# Patient Record
Sex: Male | Born: 1945
Health system: Southern US, Community
[De-identification: ages and names within clinical notes are randomized; demographics above are authoritative.]

## PROBLEM LIST (undated history)

## (undated) DIAGNOSIS — I491 Atrial premature depolarization: Secondary | ICD-10-CM

## (undated) DIAGNOSIS — I2510 Atherosclerotic heart disease of native coronary artery without angina pectoris: Secondary | ICD-10-CM

## (undated) DIAGNOSIS — I10 Essential (primary) hypertension: Secondary | ICD-10-CM

## (undated) DIAGNOSIS — R001 Bradycardia, unspecified: Secondary | ICD-10-CM

## (undated) DIAGNOSIS — I4891 Unspecified atrial fibrillation: Secondary | ICD-10-CM

## (undated) DIAGNOSIS — K5730 Diverticulosis of large intestine without perforation or abscess without bleeding: Secondary | ICD-10-CM

## (undated) DIAGNOSIS — J939 Pneumothorax, unspecified: Secondary | ICD-10-CM

## (undated) DIAGNOSIS — I4729 Other ventricular tachycardia: Secondary | ICD-10-CM

## (undated) DIAGNOSIS — I471 Supraventricular tachycardia: Secondary | ICD-10-CM

## (undated) DIAGNOSIS — IMO0002 Reserved for concepts with insufficient information to code with codable children: Secondary | ICD-10-CM

## (undated) DIAGNOSIS — T7840XA Allergy, unspecified, initial encounter: Secondary | ICD-10-CM

## (undated) DIAGNOSIS — M25519 Pain in unspecified shoulder: Secondary | ICD-10-CM

## (undated) DIAGNOSIS — I9789 Other postprocedural complications and disorders of the circulatory system, not elsewhere classified: Secondary | ICD-10-CM

## (undated) DIAGNOSIS — E7800 Pure hypercholesterolemia, unspecified: Secondary | ICD-10-CM

## (undated) DIAGNOSIS — N200 Calculus of kidney: Secondary | ICD-10-CM

## (undated) DIAGNOSIS — I472 Ventricular tachycardia: Secondary | ICD-10-CM

## (undated) DIAGNOSIS — N182 Chronic kidney disease, stage 2 (mild): Secondary | ICD-10-CM

## (undated) DIAGNOSIS — Z87442 Personal history of urinary calculi: Secondary | ICD-10-CM

## (undated) HISTORY — DX: Pain in unspecified shoulder: M25.519

## (undated) HISTORY — DX: Bradycardia, unspecified: R00.1

## (undated) HISTORY — DX: Other ventricular tachycardia: I47.29

## (undated) HISTORY — DX: Unspecified atrial fibrillation: I48.91

## (undated) HISTORY — DX: Reserved for concepts with insufficient information to code with codable children: IMO0002

## (undated) HISTORY — DX: Unspecified atrial fibrillation: I97.89

## (undated) HISTORY — PX: OTHER SURGICAL HISTORY: SHX169

## (undated) HISTORY — DX: Chronic kidney disease, stage 2 (mild): N18.2

## (undated) HISTORY — DX: Atrial premature depolarization: I49.1

## (undated) HISTORY — DX: Supraventricular tachycardia: I47.1

## (undated) HISTORY — DX: Calculus of kidney: N20.0

## (undated) HISTORY — DX: Allergy, unspecified, initial encounter: T78.40XA

## (undated) HISTORY — DX: Pneumothorax, unspecified: J93.9

## (undated) HISTORY — DX: Diverticulosis of large intestine without perforation or abscess without bleeding: K57.30

## (undated) HISTORY — DX: Atherosclerotic heart disease of native coronary artery without angina pectoris: I25.10

## (undated) HISTORY — PX: COLONOSCOPY: SHX174

## (undated) HISTORY — DX: Essential (primary) hypertension: I10

## (undated) HISTORY — DX: Pure hypercholesterolemia, unspecified: E78.00

## (undated) HISTORY — DX: Ventricular tachycardia: I47.2

---

## 2003-12-28 ENCOUNTER — Ambulatory Visit: Payer: Self-pay | Admitting: Pulmonary Disease

## 2004-02-15 ENCOUNTER — Ambulatory Visit: Payer: Self-pay | Admitting: Pulmonary Disease

## 2004-02-16 ENCOUNTER — Ambulatory Visit (HOSPITAL_COMMUNITY): Admission: RE | Admit: 2004-02-16 | Discharge: 2004-02-16 | Payer: Self-pay | Admitting: Pulmonary Disease

## 2004-04-12 ENCOUNTER — Ambulatory Visit: Payer: Self-pay | Admitting: Pulmonary Disease

## 2004-12-28 ENCOUNTER — Ambulatory Visit: Payer: Self-pay | Admitting: Pulmonary Disease

## 2005-12-31 ENCOUNTER — Ambulatory Visit: Payer: Self-pay | Admitting: Pulmonary Disease

## 2006-01-30 ENCOUNTER — Ambulatory Visit: Payer: Self-pay | Admitting: Pulmonary Disease

## 2006-01-30 LAB — CONVERTED CEMR LAB
OCCULT 1: NEGATIVE
OCCULT 3: NEGATIVE
OCCULT 4: NEGATIVE
OCCULT 5: NEGATIVE

## 2006-12-26 DIAGNOSIS — I10 Essential (primary) hypertension: Secondary | ICD-10-CM

## 2006-12-26 DIAGNOSIS — Z87442 Personal history of urinary calculi: Secondary | ICD-10-CM

## 2006-12-26 DIAGNOSIS — M5030 Other cervical disc degeneration, unspecified cervical region: Secondary | ICD-10-CM | POA: Insufficient documentation

## 2006-12-26 DIAGNOSIS — E7800 Pure hypercholesterolemia, unspecified: Secondary | ICD-10-CM

## 2006-12-26 DIAGNOSIS — K649 Unspecified hemorrhoids: Secondary | ICD-10-CM | POA: Insufficient documentation

## 2006-12-26 HISTORY — DX: Personal history of urinary calculi: Z87.442

## 2007-01-21 ENCOUNTER — Ambulatory Visit: Payer: Self-pay | Admitting: Pulmonary Disease

## 2007-01-21 DIAGNOSIS — M25519 Pain in unspecified shoulder: Secondary | ICD-10-CM | POA: Insufficient documentation

## 2007-01-21 LAB — CONVERTED CEMR LAB
AST: 22 units/L (ref 0–37)
Albumin: 3.8 g/dL (ref 3.5–5.2)
Alkaline Phosphatase: 54 units/L (ref 39–117)
BUN: 16 mg/dL (ref 6–23)
Basophils Relative: 0.9 % (ref 0.0–1.0)
Bilirubin Urine: NEGATIVE
CO2: 31 meq/L (ref 19–32)
Chloride: 104 meq/L (ref 96–112)
Creatinine, Ser: 1.3 mg/dL (ref 0.4–1.5)
Crystals: NEGATIVE
Eosinophils Relative: 4.8 % (ref 0.0–5.0)
HCT: 39.9 % (ref 39.0–52.0)
HDL: 69.4 mg/dL (ref >39.0)
Hemoglobin: 13.7 g/dL (ref 13.0–17.0)
LDL Cholesterol: 93 mg/dL (ref 0–99)
Lymphocytes Relative: 30.1 % (ref 12.0–46.0)
Monocytes Absolute: 0.7 10*3/uL (ref 0.2–0.7)
Mucus, UA: NEGATIVE
Neutro Abs: 3 10*3/uL (ref 1.4–7.7)
Neutrophils Relative %: 51.9 % (ref 43.0–77.0)
Nitrite: NEGATIVE
Potassium: 4.6 meq/L (ref 3.5–5.1)
RDW: 12.3 % (ref 11.5–14.6)
Sodium: 140 meq/L (ref 135–145)
Specific Gravity, Urine: 1.01 (ref 1.000–1.03)
TSH: 1.61 microintl units/mL (ref 0.35–5.50)
Total Bilirubin: 0.9 mg/dL (ref 0.3–1.2)
Total Protein: 6.7 g/dL (ref 6.0–8.3)
Urine Glucose: NEGATIVE mg/dL
Urobilinogen, UA: 0.2 (ref 0.0–1.0)
VLDL: 5 mg/dL (ref 0–40)
WBC: 5.9 10*3/uL (ref 4.5–10.5)

## 2008-01-21 ENCOUNTER — Ambulatory Visit: Payer: Self-pay | Admitting: Pulmonary Disease

## 2008-01-31 LAB — CONVERTED CEMR LAB
AST: 19 units/L (ref 0–37)
Alkaline Phosphatase: 53 units/L (ref 39–117)
Basophils Relative: 1 % (ref 0.0–3.0)
Bilirubin, Direct: 0.2 mg/dL (ref 0.0–0.3)
Chloride: 102 meq/L (ref 96–112)
Crystals: NEGATIVE
Eosinophils Relative: 6.4 % — ABNORMAL HIGH (ref 0.0–5.0)
GFR calc Af Amer: 66 mL/min
GFR calc non Af Amer: 55 mL/min
Glucose, Bld: 97 mg/dL (ref 70–99)
HDL: 66.3 mg/dL (ref >39.0)
Ketones, ur: NEGATIVE mg/dL
Lymphocytes Relative: 31.8 % (ref 12.0–46.0)
Monocytes Relative: 13.9 % — ABNORMAL HIGH (ref 3.0–12.0)
Neutrophils Relative %: 46.9 % (ref 43.0–77.0)
Platelets: 266 10*3/uL (ref 150–400)
Potassium: 4.4 meq/L (ref 3.5–5.1)
RBC: 4.24 M/uL (ref 4.22–5.81)
Sodium: 138 meq/L (ref 135–145)
Specific Gravity, Urine: 1.01 (ref 1.000–1.03)
Total CHOL/HDL Ratio: 2.5
Total Protein, Urine: 30 mg/dL — AB
Total Protein: 6.5 g/dL (ref 6.0–8.3)
Urine Glucose: NEGATIVE mg/dL
Urobilinogen, UA: 0.2 (ref 0.0–1.0)
VLDL: 4 mg/dL (ref 0–40)
WBC: 5.3 10*3/uL (ref 4.5–10.5)
pH: 7 (ref 5.0–8.0)

## 2008-02-02 ENCOUNTER — Telehealth (INDEPENDENT_AMBULATORY_CARE_PROVIDER_SITE_OTHER): Payer: Self-pay | Admitting: *Deleted

## 2008-02-10 ENCOUNTER — Ambulatory Visit: Payer: Self-pay | Admitting: Pulmonary Disease

## 2008-02-10 LAB — CONVERTED CEMR LAB
OCCULT 1: NEGATIVE
OCCULT 2: NEGATIVE
OCCULT 4: NEGATIVE
OCCULT 5: NEGATIVE

## 2009-01-20 ENCOUNTER — Ambulatory Visit: Payer: Self-pay | Admitting: Pulmonary Disease

## 2009-01-21 DIAGNOSIS — K5730 Diverticulosis of large intestine without perforation or abscess without bleeding: Secondary | ICD-10-CM | POA: Insufficient documentation

## 2009-01-21 LAB — CONVERTED CEMR LAB
Albumin: 4 g/dL (ref 3.5–5.2)
Alkaline Phosphatase: 57 units/L (ref 39–117)
Basophils Relative: 0.4 % (ref 0.0–3.0)
Bilirubin Urine: NEGATIVE
CO2: 30 meq/L (ref 19–32)
Chloride: 102 meq/L (ref 96–112)
Eosinophils Absolute: 0.3 10*3/uL (ref 0.0–0.7)
HCT: 38.6 % — ABNORMAL LOW (ref 39.0–52.0)
Hemoglobin, Urine: NEGATIVE
Hemoglobin: 13.4 g/dL (ref 13.0–17.0)
MCHC: 34.6 g/dL (ref 30.0–36.0)
MCV: 96 fL (ref 78.0–100.0)
Monocytes Absolute: 0.8 10*3/uL (ref 0.1–1.0)
Neutro Abs: 3 10*3/uL (ref 1.4–7.7)
Nitrite: NEGATIVE
RBC: 4.02 M/uL — ABNORMAL LOW (ref 4.22–5.81)
Sodium: 136 meq/L (ref 135–145)
Total CHOL/HDL Ratio: 2
Total Protein, Urine: NEGATIVE mg/dL
Total Protein: 6.7 g/dL (ref 6.0–8.3)

## 2009-03-06 ENCOUNTER — Ambulatory Visit: Payer: Self-pay | Admitting: Pulmonary Disease

## 2009-03-06 LAB — CONVERTED CEMR LAB
OCCULT 1: NEGATIVE
OCCULT 2: NEGATIVE
OCCULT 3: NEGATIVE
OCCULT 4: NEGATIVE

## 2009-07-24 ENCOUNTER — Telehealth: Payer: Self-pay | Admitting: Pulmonary Disease

## 2009-10-24 ENCOUNTER — Encounter (INDEPENDENT_AMBULATORY_CARE_PROVIDER_SITE_OTHER): Payer: Self-pay | Admitting: *Deleted

## 2009-11-28 ENCOUNTER — Telehealth: Payer: Self-pay | Admitting: Emergency Medicine

## 2009-12-05 ENCOUNTER — Ambulatory Visit: Payer: Self-pay | Admitting: Pulmonary Disease

## 2009-12-05 DIAGNOSIS — L219 Seborrheic dermatitis, unspecified: Secondary | ICD-10-CM | POA: Insufficient documentation

## 2010-01-19 ENCOUNTER — Ambulatory Visit: Payer: Self-pay | Admitting: Pulmonary Disease

## 2010-01-19 ENCOUNTER — Encounter: Payer: Self-pay | Admitting: Pulmonary Disease

## 2010-01-21 LAB — CONVERTED CEMR LAB
Albumin: 3.8 g/dL (ref 3.5–5.2)
BUN: 15 mg/dL (ref 6–23)
Bilirubin Urine: NEGATIVE
Calcium: 9.1 mg/dL (ref 8.4–10.5)
Creatinine, Ser: 1.3 mg/dL (ref 0.4–1.5)
Eosinophils Relative: 4.9 % (ref 0.0–5.0)
GFR calc non Af Amer: 60.35 mL/min (ref >60.00)
Glucose, Bld: 80 mg/dL (ref 70–99)
HCT: 39.9 % (ref 39.0–52.0)
HDL: 66.3 mg/dL (ref >39.00)
Hemoglobin: 13.4 g/dL (ref 13.0–17.0)
Ketones, ur: NEGATIVE mg/dL
LDL Cholesterol: 84 mg/dL (ref 0–99)
Leukocytes, UA: NEGATIVE
Lymphs Abs: 1.5 10*3/uL (ref 0.7–4.0)
Monocytes Relative: 11.4 % (ref 3.0–12.0)
Neutro Abs: 4.3 10*3/uL (ref 1.4–7.7)
PSA: 1.06 ng/mL (ref 0.10–4.00)
RBC: 4.16 M/uL — ABNORMAL LOW (ref 4.22–5.81)
Sodium: 139 meq/L (ref 135–145)
TSH: 1.64 microintl units/mL (ref 0.35–5.50)
Urine Glucose: NEGATIVE mg/dL
Urobilinogen, UA: 0.2 (ref 0.0–1.0)
VLDL: 4.8 mg/dL (ref 0.0–40.0)
WBC: 7 10*3/uL (ref 4.5–10.5)
pH: 7 (ref 5.0–8.0)

## 2010-03-15 NOTE — Assessment & Plan Note (Signed)
Summary: rov 1 yr ///kp   CC:  Yearly ROV & CPX....  History of Present Illness: 65 y/o WM here for an add-on visit... he is followed for HBP & Chol, w/ a past hx of kidney stones & cspine dz...    ~  Dec10:  he notes he's been feeling quite well and only mentions some mild postural dizziness... I note that he is on Hyzaar 100-25 & BP= 118/68 so we decided to change to Losartan 100mg /d...    ~  December 05, 2009:  Add-on for rash about the hairline- exam shows mod severe seborrheic dermatitis in scalp, some on face, and arms... we discussed Rx w/ Anti-dandruff shampoos & Lortisone cream... we will set up appt w/ Derm- DrJordan... otherw feeling well- BP controlled on meds, stable on Lip10, etc.   ~  January 19, 2010:  he states that Derm told him he had a form of Psoriasis & Rx w/ topical cream & improved... he's had a good year- no new complaints or concerns... BP controlled ;  FLP looks good;  GI stable & colon due in 2014;  he had shoulder injection from Ortho ?rotator cuff prob?[name]..     Problem List:    HYPERTENSION (ICD-401.9) - controlled on LOSARTAN 100mg /d w/ BP 110/70 today & similar at home, he says...  denies HA, fatigue, visual changes, CP, palipit, dizziness, syncope, dyspnea, edema,  etc...   ~  12/10: notes sl postural dizziness on & off so we decided to change Hyzaar to LOSARTAN 100mg /d.  ~  12/11:  doing very well on the Losartan 100mg /d- BP 110/70 & similar at home;  CXR is clear, WNL;  EKG shows sinus brady, NAD...  HYPERCHOLESTEROLEMIA (ICD-272.0) - controlled on diet and LIPITOR 10mg /d...  ~  FLP 11/07 showed TChol 147, TG 24, HDL 61, LDL 81...  ~  FLP 12/08 showed TChol 167, TG 25, HDL 69, LDL 93... continue same Rx.  ~  FLP 12.09 showed TChol 163, TG 20, HDL 66. LDL 93... rec- same.  ~  FLP 12/10 showed TChol 161, TG 20, HDL 83, LDL 74  ~  FLP 12/11 on Lip10 showed TChol 155, TG 24, HDL 66, LDL 84  DIVERTICULITIS, COLON (ICD-562.11) & HEMORRHOIDS (ICD-455.6) - last  colonoscopy 11/04 by [name] w/ divertics & int hems, f/u planned 63yrs per GI protocol...  DEGENERATIVE DISC DISEASE, CERVICAL SPINE (ICD-722.4) & SHOULDER PAIN (ICD-719.41) - eval 6/97 by Dagoberto Reef w/ MRI showing DDD & mild sp stenosis w/o nerve root impingement - Rx conservatively;  increased pain in 2/06 w/ eval DrElsner and MRI showing spondylosis at 3 levels, again Rx conserv...  he uses CELEBREX 200mg  Prn for pain...  RENAL CALCULUS, HX OF (ICD-V13.01) - he's had 3 stones in the past and managed to pass all of them... last about 6 years ago... no prob currently.  Health Maint - on ASA 81mg /d... he is up-to-date on his Colonoscopy (last 2004 w/ f/u in 18yrs), PSA (12/11= 1.06), and vaccinations (flu shot today) & had pneumovax 8/03 & tetanus 8/03...   Preventive Screening-Counseling & Management  Alcohol-Tobacco     Smoking Status: quit     Year Quit: 1981  Allergies (verified): No Known Drug Allergies  Comments:  Nurse/Medical Assistant: The patient's medications and allergies were reviewed with the patient and were updated in the Medication and Allergy Lists.  Past History:  Past Medical History: 1. HYPERTENSION (ICD-401.9) 2. HYPERCHOLESTEROLEMIA (ICD-272.0) 3. DIVERTICULOSIS, COLON (ICD-562.10) - last colonoscopy 11/04 by [name]  w/ divertics & int hems; f/u 38yrs. 4. HEMORRHOIDS (ICD-455.6) 5. RENAL CALCULUS, HX OF (ICD-V13.01) 6. DEGENERATIVE DISC DISEASE, CERVICAL SPINE (ICD-722.4) - eval 6/97 by Dagoberto Reef w/ MRI showing DDD & mild sp stenosis w/o nerve root impingement - Rx conservatively;  increased pain in 2/06 w/ eval DrElsner and MRI showing spondylosis at 3 levels, again Rx conserv. 7. SHOULDER PAIN (ICD-719.41)  Family History: Reviewed history from 12/05/2009 and no changes required. Father died age 48 from ?cause, hx CABG... Mother died age 47 w/ MI... 3 Siblings- all Sisters 1 Sister died age 60 w/ lymphoma 2 Sisters alive & well  Social  History: Reviewed history from 12/05/2009 and no changes required. 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 Ex-smoker - w/ 10 packyr  hx, quit >30 yrs ago Social Loss adjuster, chartered  Review of Systems  The patient denies fever, chills, sweats, anorexia, fatigue, weakness, malaise, weight loss, sleep disorder, blurring, diplopia, eye irritation, eye discharge, vision loss, eye pain, photophobia, earache, ear discharge, tinnitus, decreased hearing, nasal congestion, nosebleeds, sore throat, hoarseness, chest pain, palpitations, syncope, dyspnea on exertion, orthopnea, PND, peripheral edema, cough, dyspnea at rest, excessive sputum, hemoptysis, wheezing, pleurisy, nausea, vomiting, diarrhea, constipation, change in bowel habits, abdominal pain, melena, hematochezia, jaundice, gas/bloating, indigestion/heartburn, dysphagia, odynophagia, dysuria, hematuria, urinary frequency, urinary hesitancy, nocturia, incontinence, back pain, joint pain, joint swelling, muscle cramps, muscle weakness, stiffness, arthritis, sciatica, restless legs, leg pain at night, leg pain with exertion, rash, itching, dryness, suspicious lesions, paralysis, paresthesias, seizures, tremors, vertigo, transient blindness, frequent falls, frequent headaches, difficulty walking, depression, anxiety, memory loss, confusion, cold intolerance, heat intolerance, polydipsia, polyphagia, polyuria, unusual weight change, abnormal bruising, bleeding, enlarged lymph nodes, urticaria, allergic rash, hay fever, and recurrent infections.    Vital Signs:  Patient profile:   65 year old male Height:      69 inches Weight:      161.38 pounds BMI:     23.92 O2 Sat:      98 % on Room air Temp:     97.0 degrees F oral Pulse rate:   55 / minute BP sitting:   110 / 70  (left arm) Cuff size:   large  Vitals Entered By: Randell Loop CMA (January 19, 2010 8:51 AM)  O2 Sat at Rest %:  98 O2 Flow:  Room air CC: Yearly ROV &  CPX... Is Patient Diabetic? No Pain Assessment Patient in pain? no      Comments meds updated today with pt   Physical Exam  Additional Exam:  WD, WN, 65 y/o WM in NAD... GENERAL:  Alert & oriented; pleasant & cooperative... HEENT:  [place]/AT, EOM-wnl, PERRLA, EACs-clear, TMs-wnl, NOSE-clear, THROAT-clear & wnl. NECK:  Supple w/ fairROM; no JVD; normal carotid impulses w/o bruits; no thyromegaly or nodules palpated; no lymphadenopathy. CHEST:  Clear to P & A; without wheezes/ rales/ or rhonchi. HEART:  Regular Rhythm; without murmurs/ rubs/ or gallops. ABDOMEN:  Soft & nontender; normal bowel sounds; no organomegaly or masses detected. RECTAL:  Neg - prostate 2+ & nontender w/o nodules; stool hematest neg. EXT: without deformities or arthritic changes; no varicose veins/ venous insuffic/ or edema. NEURO:  CN's intact; motor testing normal; sensory testing normal; gait normal & balance OK. DERM:  Improved seborrheic dermatitis in scalp (dandruff), some on face, and arms...    Impression & Recommendations:  Problem # 1:  PHYSICAL EXAMINATION (ICD-V70.0)  Orders: EKG w/ Interpretation (93000) T-2 View CXR (71020TC)  TLB-BMP (Basic Metabolic Panel-BMET) (80048-METABOL) TLB-Hepatic/Liver Function Pnl (80076-HEPATIC) TLB-CBC Platelet - w/Differential (85025-CBCD) TLB-Lipid Panel (80061-LIPID) TLB-TSH (Thyroid Stimulating Hormone) (84443-TSH) TLB-PSA (Prostate Specific Antigen) (84153-PSA) TLB-Udip w/ Micro (81001-URINE)  Problem # 2:  HYPERTENSION (ICD-401.9) Controlled>  same med. His updated medication list for this problem includes:    Losartan Potassium 100 Mg Tabs (Losartan potassium) .[name]... Take 1 tab by mouth once daily...  Problem # 3:  HYPERCHOLESTEROLEMIA (ICD-272.0) Doing very well on Lip10>  continue same. His updated medication list for this problem includes:    Lipitor 20 Mg Tabs (Atorvastatin calcium) .[name]... Take as directed...  Problem # 4:  DIVERTICULOSIS OF  COLON (ICD-562.10) He is up to date w/ f/u colon due 11/14...  Problem # 5:  DEGENERATIVE DISC DISEASE, CERVICAL SPINE (ICD-722.4) Ortho stable & we will refill Celebrex per his request...  Problem # 6:  OTHER MEDICAL PROBLEMS AS NOTED>>>  Complete Medication List: 1)  Aspirin 325 Mg Tabs (Aspirin) .... Take 1 tablet by mouth once a day 2)  Losartan Potassium 100 Mg Tabs (Losartan potassium) .... Take 1 tab by mouth once daily.Marland KitchenMarland Kitchen 3)  Lipitor 20 Mg Tabs (Atorvastatin calcium) .... Take as directed... 4)  Celebrex 200 Mg Caps (Celecoxib) .... Take 1 cap by mouth once daily as needed for arthritis pain...  Patient Instructions: 1)  Today we updated your med list- see below.... 2)  We refilled your meds as discussed, including the Lipitor 20mg  tabs. 3)  Today we did your follow up CXR, EKG, & FASTING blood work... please call the "phone tree" in a few days for your lab results.Marland KitchenMarland Kitchen 4)  Keep up the good job w/ diet & exercise... 5)  Call for any problems.Marland KitchenMarland Kitchen 6)  Please schedule a follow-up appointment in 1 year. Prescriptions: CELEBREX 200 MG CAPS (CELECOXIB) take 1 cap by mouth once daily as needed for arthritis pain...  #30 x 12   Entered and Authorized by:   Michele Mcalpine MD   Signed by:   Michele Mcalpine MD on 01/19/2010   Method used:   Print then Give to Patient   RxID:   [id number] LIPITOR 20 MG TABS (ATORVASTATIN CALCIUM) take as directed...  #30 x 12   Entered and Authorized by:   Michele Mcalpine MD   Signed by:   Michele Mcalpine MD on 01/19/2010   Method used:   Print then Give to Patient   RxID:   [id number] LOSARTAN POTASSIUM 100 MG TABS (LOSARTAN POTASSIUM) take 1 tab by mouth once daily...  #30 x 12   Entered and Authorized by:   Michele Mcalpine MD   Signed by:   Michele Mcalpine MD on 01/19/2010   Method used:   Print then Give to Patient   RxID:   [id number]

## 2010-03-15 NOTE — Progress Notes (Signed)
Summary: shoulder pain  Phone Note Call from Patient Call back at Work Phone [phone]   Caller: Patient Call For: nadel Summary of Call: pt c/o R shoulder pain x 2 wks. says it's "puffy". should he see sn or be referred to ortho? says it feels like it's "out of joint". pt work in [employer]. pt # [id number] Initial call taken by: Tivis Ringer, CNA,  July 24, 2009 10:09 AM  Follow-up for Phone Call        Pt c/o having pain in right shoulder x 2 weeks. he staets he can't raise it up very high without it hurting, "feels like it is out of joint" although he knows it is not. He also states it is swollen as well. Pt states he has never seen an ortho doc and if this is what is rec. then he will need a referral. Please advise. Thanks. Carron Curie CMA  July 24, 2009 10:46 AM   Additional Follow-up for Phone Call Additional follow up Details #1::        order has been placed for pt to have appt with ortho Additional Follow-up by: Randell Loop CMA,  July 24, 2009 11:27 AM

## 2010-03-15 NOTE — Progress Notes (Signed)
Summary: referral  Phone Note Call from Patient Call back at [phone]   Caller: Patient Call For: nadel Summary of Call: need referral to see dermatologist at  [facility] dermatology perfer  dr [place] or whoever dr Kriste Basque reccommend. Initial call taken by: Rickard Patience,  November 28, 2009 1:51 PM  Follow-up for Phone Call        explained to pt unable to get derm appt for next week unable to get him in that soon, gave pt appt with sn for 10/25 at 12:00--told hime we could refer at [facility] but also maybe sn could give him something to help in the meantime  Follow-up by: Philipp Deputy CMA,  December 01, 2009 11:24 AM

## 2010-03-15 NOTE — Letter (Signed)
Summary: Colonoscopy Date Change Letter  [facility]  [address] [place], [place] [address]   Phone: [phone]  Fax: [phone]      October 24, 2009 MRN: [id number]   Jeff Graves [address] [place], [place]  [address]   Dear [name],   Previously you were recommended to have a repeat colonoscopy around this time. Your chart was recently reviewed by Dr. Russella Dar of [facility]. Follow up colonoscopy is now recommended in November 2014. This revised recommendation is based on current, nationally recognized guidelines for colorectal cancer screening and polyp surveillance. These guidelines are endorsed by the American Cancer Society, [employer] on Colorectal Cancer as well as numerous other major medical organizations.  Please understand that our recommendation assumes that you do not have any new symptoms such as bleeding, a change in bowel habits, anemia, or significant abdominal discomfort. If you do have any concerning GI symptoms or want to discuss the guideline recommendations, please call to arrange an office visit at your earliest convenience. Otherwise we will keep you in our reminder system and contact you 1-2 months prior to the date listed above to schedule your next colonoscopy.  Thank you,   Judie Petit T. Russella Dar, M.D.  [facility] [phone]

## 2010-03-15 NOTE — Assessment & Plan Note (Signed)
Summary: rash around hairline and ears//td   CC:  10 month ROV & add-on for rash....  History of Present Illness: 65 y/o WM here for an add-on visit... he is followed for HBP & Chol, w/ a past hx of kidney stones & cspine dz...    ~  Dec10:  he notes he's been feeling quite well and only mentions some mild postural dizziness... I note that he is on Hyzaar 100-25 & BP= 118/68 so we decided to change to Losartan 100mg /d...    ~  December 05, 2009:  Add-on for rash about the hairline- exam shows mod severe seborrheic dermatitis in scalp, some on face, and arms... we discussed Rx w/ Anti-dandruff shampoos & Lortisone cream... we will set up appt w/ Derm- DrJordan... otherw feeling well- BP controlled on meds, stable on Lip10, etc.    Problem List:    HYPERTENSION (ICD-401.9) - controlled on HYZAAR 100-25 daily w/ BP 138/82 today & similar at home, he says...  denies HA, fatigue, visual changes, CP, palipit, dizziness, syncope, dyspnea, edema,  etc...   ~  12/10: notes sl postural dizziness on & off so we decided to change Hyzaar to LOSARTAN 100mg /d.  HYPERCHOLESTEROLEMIA (ICD-272.0) - controlled on diet and LIPITOR 10mg /d... his wt is 164# which is down 1# from last yr...  ~  FLP 11/07 showed TChol 147, TG 24, HDL 61, LDL 81...  ~  FLP 12/08 showed TChol 167, TG 25, HDL 69, LDL 93... continue same Rx.  ~  FLP 12.09 showed TChol 163, TG 20, HDL 66. LDL 93... rec- same.  ~  FLP 12/10 showed TChol 161, TG 20, HDL 83, LDL 74  DIVERTICULITIS, COLON (ICD-562.11) & HEMORRHOIDS (ICD-455.6) - last colonoscopy 11/04 by [name] w/ divertics & int hems; f/u 25yrs & due 11/11...  DEGENERATIVE DISC DISEASE, CERVICAL SPINE (ICD-722.4) & SHOULDER PAIN (ICD-719.41) - eval 6/97 by Dagoberto Reef w/ MRI showing DDD & mild sp stenosis w/o nerve root impingement - Rx conservatively;  increased pain in 2/06 w/ eval DrElsner and MRI showing spondylosis at 3 levels, again Rx conserv.  ~  12/10: he requests Celebrex  perscription since it helped him in the past...  RENAL CALCULUS, HX OF (ICD-V13.01) - he's had 3 stones in the past and managed to pass all of them... last about 6 years ago... no prob currently.  Health Maint - on ASA 81mg /d... he is up-to-date on his Colonoscopy (last 2004 w/ f/u in 63yrs), PSA (repeat today), and vaccinations (flu shot today) & had pneumovax 8/03 & tetanus 8/03...   Preventive Screening-Counseling & Management  Alcohol-Tobacco     Smoking Status: quit     Year Quit: 1981  Allergies (verified): No Known Drug Allergies  Comments:  Nurse/Medical Assistant: The patient's medications and allergies were reviewed with the patient and were updated in the Medication and Allergy Lists.  Past History:  Past Medical History: 1. HYPERTENSION (ICD-401.9) 2. HYPERCHOLESTEROLEMIA (ICD-272.0) 3. DIVERTICULOSIS, COLON (ICD-562.10) - last colonoscopy 11/04 by [name] w/ divertics & int hems; f/u 26yrs. 4. HEMORRHOIDS (ICD-455.6) 5. RENAL CALCULUS, HX OF (ICD-V13.01) 6. DEGENERATIVE DISC DISEASE, CERVICAL SPINE (ICD-722.4) - eval 6/97 by Dagoberto Reef w/ MRI showing DDD & mild sp stenosis w/o nerve root impingement - Rx conservatively;  increased pain in 2/06 w/ eval DrElsner and MRI showing spondylosis at 3 levels, again Rx conserv. 7. SHOULDER PAIN (ICD-719.41)  Family History: Reviewed history from 01/20/2009 and no changes required. Father died age 71 from ?cause, hx CABG... Mother died age  62 w/ MI... 3 Siblings- all Sisters 1 Sister died age 63 w/ lymphoma 2 Sisters alive & well  Social History: Reviewed history from 01/20/2009 and no changes required. 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         CC today is the rash...  Vital Signs:  Patient profile:   65 year old male Height:      69 inches Weight:      163 pounds BMI:     24.16 O2 Sat:      100 % on Room air Temp:     96.8 degrees F oral Pulse rate:   56 / minute BP sitting:   138 / 82  (left arm) Cuff size:   regular  Vitals Entered By: Randell Loop CMA (December 05, 2009 11:58 AM)  O2 Sat at Rest %:  100 O2 Flow:  Room air CC: 10 month ROV & add-on for rash... Is Patient Diabetic? No Pain Assessment Patient in pain? no      Comments no changes in meds today   Physical Exam  Additional Exam:  WD, WN, 65 y/o WM in NAD... GENERAL:  Alert & oriented; pleasant & cooperative... HEENT:  [place]/AT, EOM-wnl, PERRLA, EACs-clear, TMs-wnl, NOSE-clear, THROAT-clear & wnl. NECK:  Supple w/ fairROM; no JVD; normal carotid impulses w/o bruits; no thyromegaly or nodules palpated; no lymphadenopathy. CHEST:  Clear to P & A; without wheezes/ rales/ or rhonchi. HEART:  Regular Rhythm; without murmurs/ rubs/ or gallops. ABDOMEN:  Soft & nontender; normal bowel sounds; no organomegaly or masses detected. RECTAL:  Neg - prostate 2+ & nontender w/o nodules; stool hematest neg. EXT: without deformities or arthritic changes; no varicose veins/ venous insuffic/ or edema. NEURO:  CN's intact; motor testing normal; sensory testing normal; gait normal & balance OK. DERM:  Mod severe seborrheic dermatitis in scalp (dandruff), some on face, and arms...    Impression & Recommendations:  Problem # 1:  SEBORRHEIC DERMATITIS (ICD-690.10) We discussed Rx w/ Shampoos> Head&Shoulders, SelsunBlue, T-Gel & alternate betw  them... Rx w/ gen Lotrisone cream Bid & ask derm to check pt as well... Orders: Dermatology Referral (Derma)  Problem # 2:  HYPERTENSION (ICD-401.9) Controlled>  same Rx. His updated medication list for this problem includes:    Losartan Potassium 100 Mg Tabs (Losartan potassium) .[name]... Take 1 tab by mouth once daily...  Problem # 3:  HYPERCHOLESTEROLEMIA (ICD-272.0) Continue Lip10... His updated medication list for this problem includes:    Lipitor 10 Mg Tabs (Atorvastatin calcium) .[name]... Take 1 tablet by mouth once a day  Problem # 4:  OTHER MEDICAL PROBLEMS AS NOTED>>> OK Flu shot today...  Complete Medication List: 1)  Aspirin 325 Mg Tabs (Aspirin) .... Take 1 tablet by mouth once a day 2)  Losartan Potassium 100 Mg Tabs (Losartan potassium) .... Take 1 tab by mouth once daily.Marland KitchenMarland Kitchen 3)  Lipitor 10 Mg Tabs (Atorvastatin calcium) .... Take 1 tablet by mouth once a day 4)  Celebrex 200  Mg Caps (Celecoxib) .... Take 1 cap by mouth once daily as needed for arthritis pain.Marland KitchenMarland Kitchen 5)  Lotrisone 1-0.05 % Crea (Clotrimazole-betamethasone) .... Apply to rash two times a day as directed...  Other Orders: Admin 1st Vaccine ([address]) Flu Vaccine 72yrs + [phone])  Patient Instructions: 1)  Today we updated your med list- see below.... 2)  We discussed the needed shampoo> alternate Head & Shoulders, Selsun Blue & T-Gel.Marland KitchenMarland Kitchen 3)  Use the Lotrisone (generic) cream twice daily.Marland KitchenMarland Kitchen 4)  We will set up a Derm consultation w/ DrJordan for you... 5)  Call for any questions... Prescriptions: LOTRISONE 1-0.05 % CREA (CLOTRIMAZOLE-BETAMETHASONE) apply to rash two times a day as directed...  #1 large tube x prn   Entered and Authorized by:   Michele Mcalpine MD   Signed by:   Michele Mcalpine MD on 12/05/2009   Method used:   Print then Give to Patient   RxID:   [id number]     Flu Vaccine Consent Questions     Do you have a history of severe allergic reactions to this vaccine? no    Any prior history of  allergic reactions to egg and/or gelatin? no    Do you have a sensitivity to the preservative Thimersol? no    Do you have a past history of Guillan-Barre Syndrome? no    Do you currently have an acute febrile illness? no    Have you ever had a severe reaction to latex? no    Vaccine information given and explained to patient? yes    Are you currently pregnant? no    Lot Number:[id number]   Exp Date:08/11/2010   Site Given  Right Deltoid [id number]   Randell Loop [facility]  December 05, 2009 12:27 PM

## 2011-01-22 ENCOUNTER — Other Ambulatory Visit (INDEPENDENT_AMBULATORY_CARE_PROVIDER_SITE_OTHER): Payer: BC Managed Care – PPO

## 2011-01-22 ENCOUNTER — Encounter: Payer: Self-pay | Admitting: Pulmonary Disease

## 2011-01-22 ENCOUNTER — Ambulatory Visit (INDEPENDENT_AMBULATORY_CARE_PROVIDER_SITE_OTHER): Payer: BC Managed Care – PPO | Admitting: Pulmonary Disease

## 2011-01-22 ENCOUNTER — Ambulatory Visit (INDEPENDENT_AMBULATORY_CARE_PROVIDER_SITE_OTHER)
Admission: RE | Admit: 2011-01-22 | Discharge: 2011-01-22 | Disposition: A | Discharge status: Home / Self Care | Payer: BC Managed Care – PPO | Source: Ambulatory Visit | Attending: Pulmonary Disease | Admitting: Pulmonary Disease

## 2011-01-22 VITALS — BP 120/68 | HR 46 | Temp 97.6°F | Ht 69.0 in | Wt 158.4 lb

## 2011-01-22 DIAGNOSIS — K5730 Diverticulosis of large intestine without perforation or abscess without bleeding: Secondary | ICD-10-CM

## 2011-01-22 DIAGNOSIS — E7800 Pure hypercholesterolemia, unspecified: Secondary | ICD-10-CM

## 2011-01-22 DIAGNOSIS — Z0000 Encounter for general adult medical examination without abnormal findings: Secondary | ICD-10-CM

## 2011-01-22 DIAGNOSIS — M5030 Other cervical disc degeneration, unspecified cervical region: Secondary | ICD-10-CM

## 2011-01-22 DIAGNOSIS — I10 Essential (primary) hypertension: Secondary | ICD-10-CM

## 2011-01-22 DIAGNOSIS — Z23 Encounter for immunization: Secondary | ICD-10-CM

## 2011-01-22 DIAGNOSIS — Z87442 Personal history of urinary calculi: Secondary | ICD-10-CM

## 2011-01-22 LAB — CBC WITH DIFFERENTIAL/PLATELET
Basophils Absolute: 0 10*3/uL (ref 0.0–0.1)
Basophils Relative: 0.6 % (ref 0.0–3.0)
Eosinophils Relative: 3.9 % (ref 0.0–5.0)
HCT: 38.2 % — ABNORMAL LOW (ref 39.0–52.0)
Hemoglobin: 12.9 g/dL — ABNORMAL LOW (ref 13.0–17.0)
Lymphocytes Relative: 31.7 % (ref 12.0–46.0)
Lymphs Abs: 1.6 10*3/uL (ref 0.7–4.0)
Monocytes Relative: 11.8 % (ref 3.0–12.0)
Neutro Abs: 2.5 10*3/uL (ref 1.4–7.7)
RBC: 3.97 Mil/uL — ABNORMAL LOW (ref 4.22–5.81)
WBC: 4.9 10*3/uL (ref 4.5–10.5)

## 2011-01-22 LAB — BASIC METABOLIC PANEL
BUN: 20 mg/dL (ref 6–23)
CO2: 29 mEq/L (ref 19–32)
Calcium: 9.1 mg/dL (ref 8.4–10.5)
Creatinine, Ser: 1.3 mg/dL (ref 0.4–1.5)
GFR: 58.05 mL/min — ABNORMAL LOW (ref >60.00)
Glucose, Bld: 91 mg/dL (ref 70–99)
Sodium: 139 mEq/L (ref 135–145)

## 2011-01-22 LAB — HEPATIC FUNCTION PANEL
Albumin: 3.8 g/dL (ref 3.5–5.2)
Alkaline Phosphatase: 63 U/L (ref 39–117)
Total Protein: 6.5 g/dL (ref 6.0–8.3)

## 2011-01-22 LAB — URINALYSIS
Hgb urine dipstick: NEGATIVE
Nitrite: NEGATIVE
Specific Gravity, Urine: 1.015 (ref 1.000–1.030)
Total Protein, Urine: NEGATIVE
Urine Glucose: NEGATIVE

## 2011-01-22 LAB — LIPID PANEL
HDL: 73.5 mg/dL (ref >39.00)
Triglycerides: 17 mg/dL (ref 0.0–149.0)
VLDL: 3.4 mg/dL (ref 0.0–40.0)

## 2011-01-22 MED ORDER — CELECOXIB 200 MG PO CAPS: 200.0000 mg | ORAL_CAPSULE | Freq: Every day | ORAL | Status: DC | PRN

## 2011-01-22 MED ORDER — ATORVASTATIN CALCIUM 20 MG PO TABS: 20.0000 mg | ORAL_TABLET | Freq: Every day | ORAL | Status: DC

## 2011-01-22 MED ORDER — LOSARTAN POTASSIUM 100 MG PO TABS: 100.0000 mg | ORAL_TABLET | Freq: Every day | ORAL | Status: DC

## 2011-01-22 NOTE — Patient Instructions (Signed)
Today we updated your med list in our EPIC system...    Continue your current medications the same...    We refilled your meds per request...  Today we did your follow up CXR & fasting blood work...    Please call the PHONE TREE in a few days for your results...    Dial [id number] & when prompted enter your patient number followed by the # symbol...    Your patient number is:  [id number]#  We gave you the 2012 Flu vaccine today...   Call for any questions...  Let's plan a follow up visit in one year, sooner if needed for problems.Marland KitchenMarland Kitchen

## 2011-01-22 NOTE — Progress Notes (Signed)
Subjective:    Patient ID: Jeff Graves, male    DOB: 04-11-1945, 65 y.o.   MRN: [id number]  HPI 65 y/o WM here for an add-on visit... he is followed for HBP & Chol, w/ a past hx of kidney stones & cspine dz...   ~  January 19, 2010:  he states that Derm told him he had a form of Psoriasis & Rx w/ topical cream & improved... he's had a good year- no new complaints or concerns... BP controlled ;  FLP looks good;  GI stable & colon due in 2014;  he had shoulder injection from Ortho ?rotator cuff prob?[name]..   ~  January 22, 2011:  Yearly ROV & CPX> he reports a good yr, no new complaints or concerns;  BP controlled on Cozaar;  Chol looks good on Lip10; Arthritis controlled on nCelebrex which he takes prn & tolerates well;  He requests refills for 30d supplies;  OK 2012 Flu vaccine today...  Due for f/u CXR (clear, NAD), EKG (SBrady), Fasting labs (all essen wnl) today           Problem List:    HYPERTENSION (ICD-401.9) - controlled on LOSARTAN 100mg /d w/ BP 120/68 today & similar at home, he says...  denies HA, fatigue, visual changes, CP, palipit, dizziness, syncope, dyspnea, edema,  etc...  ~  12/10: notes sl postural dizziness on & off so we decided to change Hyzaar to LOSARTAN 100mg /d. ~  12/11:  doing very well on the Losartan 100mg /d- BP 110/70 & similar at home;  CXR is clear, WNL;  EKG shows sinus brady, NAD... ~  12/12:  BP= 120/68 & he remains asymptomatic;  CXR remains clear;  EKG shows SBrady rate46 otherw wnl...  HYPERCHOLESTEROLEMIA (ICD-272.0) - controlled on diet and LIPITOR 10mg /d... ~  FLP 11/07 showed TChol 147, TG 24, HDL 61, LDL 81... ~  FLP 12/08 showed TChol 167, TG 25, HDL 69, LDL 93... continue same Rx. ~  FLP 12.09 showed TChol 163, TG 20, HDL 66. LDL 93... rec- same. ~  FLP 12/10 showed TChol 161, TG 20, HDL 83, LDL 74 ~  FLP 12/11 on Lip10 showed TChol 155, TG 24, HDL 66, LDL 84 ~  FLP 12/12 on Lip10 showed TChol 166, TG 17, HDL 74, LDL 89  DIVERTICULITIS,  COLON (ICD-562.11) & HEMORRHOIDS (ICD-455.6) - last colonoscopy 11/04 by [name] w/ divertics & int hems, f/u planned 5yrs per GI protocol...  DEGENERATIVE DISC DISEASE, CERVICAL SPINE (ICD-722.4) & SHOULDER PAIN (ICD-719.41) - eval 6/97 by Dagoberto Reef w/ MRI showing DDD & mild sp stenosis w/o nerve root impingement - Rx conservatively;  increased pain in 2/06 w/ eval DrElsner and MRI showing spondylosis at 3 levels, again Rx conserv...  he uses CELEBREX 200mg  Prn for pain...  RENAL CALCULUS, HX OF (ICD-V13.01) - he's had 3 stones in the past and managed to pass all of them... last about 6 years ago... no prob currently.  RASH >> looked like seb dermatitis to me but Derm told him it was a form of Psoriasis & Rx by DrAJordan w/ topical cream==> improved...  Health Maint - on ASA 81mg /d... he is up-to-date on his Colonoscopy (last 2004 w/ f/u in 51yrs), PSA (12/12= 2.08), and vaccinations (flu shot today) & had pneumovax 8/03 & tetanus 8/03...   No past surgical history on file.   Outpatient Encounter Prescriptions as of 01/22/2011  Medication Sig Dispense Refill  . aspirin 325 MG tablet Take 325 mg by mouth daily.        [name]  atorvastatin (LIPITOR) 20 MG tablet Take as directed       . celecoxib (CELEBREX) 200 MG capsule Take 200 mg by mouth daily. As needed for arthritis pain       . losartan (COZAAR) 100 MG tablet Take 100 mg by mouth daily.          No Known Allergies   Current Medications, Allergies, Past Medical History, Past Surgical History, Family History, and Social History were reviewed in [employer] record.    Review of Systems    The patient denies fever, chills, sweats, anorexia, fatigue, weakness, malaise, weight loss, sleep disorder, blurring, diplopia, eye irritation, eye discharge, vision loss, eye pain, photophobia, earache, ear discharge, tinnitus, decreased hearing, nasal congestion, nosebleeds, sore throat, hoarseness, chest pain, palpitations,  syncope, dyspnea on exertion, orthopnea, PND, peripheral edema, cough, dyspnea at rest, excessive sputum, hemoptysis, wheezing, pleurisy, nausea, vomiting, diarrhea, constipation, change in bowel habits, abdominal pain, melena, hematochezia, jaundice, gas/bloating, indigestion/heartburn, dysphagia, odynophagia, dysuria, hematuria, urinary frequency, urinary hesitancy, nocturia, incontinence, back pain, joint pain, joint swelling, muscle cramps, muscle weakness, stiffness, arthritis, sciatica, restless legs, leg pain at night, leg pain with exertion, rash, itching, dryness, suspicious lesions, paralysis, paresthesias, seizures, tremors, vertigo, transient blindness, frequent falls, frequent headaches, difficulty walking, depression, anxiety, memory loss, confusion, cold intolerance, heat intolerance, polydipsia, polyphagia, polyuria, unusual weight change, abnormal bruising, bleeding, enlarged lymph nodes, urticaria, allergic rash, hay fever, and recurrent infections.     Objective:   Physical Exam    WD, WN, 65 y/o WM in NAD... GENERAL:  Alert & oriented; pleasant & cooperative... HEENT:  [place]/AT, EOM-wnl, PERRLA, EACs-clear, TMs-wnl, NOSE-clear, THROAT-clear & wnl. NECK:  Supple w/ fairROM; no JVD; normal carotid impulses w/o bruits; no thyromegaly or nodules palpated; no lymphadenopathy. CHEST:  Clear to P & A; without wheezes/ rales/ or rhonchi. HEART:  Regular Rhythm; without murmurs/ rubs/ or gallops. ABDOMEN:  Soft & nontender; normal bowel sounds; no organomegaly or masses detected. RECTAL:  Neg - prostate 2+ & nontender w/o nodules; stool hematest neg. EXT: without deformities or arthritic changes; no varicose veins/ venous insuffic/ or edema. NEURO:  CN's intact; motor testing normal; sensory testing normal; gait normal & balance OK. DERM:  Improved seborrheic dermatitis in scalp (dandruff), some on face, and arms...  RADIOLOGY DATA:  Reviewed in the EPIC EMR & discussed w/ the  patient...  LABORATORY DATA:  Reviewed in the EPIC EMR & discussed w/ the patient...   Assessment & Plan:   CPX>>    HBP>  Controlled on Losartan; continue same...  GI> Divertics, Hems>  Stable, asymptomatic, up to date on colon screen due 11/14...  Hx Kidney Stones>  Stable, no recurrence...  DJD>  CSpine, Shoulder pain>  He uses Celebrex as needed...  Other medical problems as noted.Marland KitchenMarland Kitchen

## 2011-02-13 ENCOUNTER — Encounter: Payer: Self-pay | Admitting: Pulmonary Disease

## 2011-03-15 ENCOUNTER — Other Ambulatory Visit: Payer: Self-pay | Admitting: Pulmonary Disease

## 2011-03-15 MED ORDER — CELECOXIB 200 MG PO CAPS: 200.0000 mg | ORAL_CAPSULE | Freq: Every day | ORAL | Status: DC | PRN

## 2011-04-02 ENCOUNTER — Other Ambulatory Visit: Payer: BC Managed Care – PPO

## 2011-04-03 DIAGNOSIS — Z0000 Encounter for general adult medical examination without abnormal findings: Secondary | ICD-10-CM

## 2011-04-03 LAB — HEMOCCULT SLIDES (X 3 CARDS)
OCCULT 1: NEGATIVE
OCCULT 2: NEGATIVE
OCCULT 4: NEGATIVE
OCCULT 5: NEGATIVE

## 2011-04-04 ENCOUNTER — Telehealth: Payer: Self-pay | Admitting: Pulmonary Disease

## 2011-04-04 NOTE — Telephone Encounter (Signed)
I spoke with patient about results and he verbalized understanding and had no questions 

## 2012-01-22 ENCOUNTER — Encounter: Payer: Self-pay | Admitting: *Deleted

## 2012-01-23 ENCOUNTER — Encounter: Payer: Self-pay | Admitting: Pulmonary Disease

## 2012-01-23 ENCOUNTER — Ambulatory Visit (INDEPENDENT_AMBULATORY_CARE_PROVIDER_SITE_OTHER)
Admission: RE | Admit: 2012-01-23 | Discharge: 2012-01-23 | Disposition: A | Discharge status: Home / Self Care | Payer: BC Managed Care – PPO | Source: Ambulatory Visit | Attending: Pulmonary Disease | Admitting: Pulmonary Disease

## 2012-01-23 ENCOUNTER — Other Ambulatory Visit (INDEPENDENT_AMBULATORY_CARE_PROVIDER_SITE_OTHER): Payer: BC Managed Care – PPO

## 2012-01-23 ENCOUNTER — Ambulatory Visit (INDEPENDENT_AMBULATORY_CARE_PROVIDER_SITE_OTHER): Payer: BC Managed Care – PPO | Admitting: Pulmonary Disease

## 2012-01-23 VITALS — BP 118/68 | HR 48 | Temp 97.0°F | Ht 69.0 in | Wt 158.4 lb

## 2012-01-23 DIAGNOSIS — Z0000 Encounter for general adult medical examination without abnormal findings: Secondary | ICD-10-CM

## 2012-01-23 DIAGNOSIS — Z87442 Personal history of urinary calculi: Secondary | ICD-10-CM

## 2012-01-23 DIAGNOSIS — M5030 Other cervical disc degeneration, unspecified cervical region: Secondary | ICD-10-CM

## 2012-01-23 DIAGNOSIS — I10 Essential (primary) hypertension: Secondary | ICD-10-CM

## 2012-01-23 DIAGNOSIS — E7800 Pure hypercholesterolemia, unspecified: Secondary | ICD-10-CM

## 2012-01-23 DIAGNOSIS — L219 Seborrheic dermatitis, unspecified: Secondary | ICD-10-CM

## 2012-01-23 DIAGNOSIS — Z23 Encounter for immunization: Secondary | ICD-10-CM

## 2012-01-23 DIAGNOSIS — K5730 Diverticulosis of large intestine without perforation or abscess without bleeding: Secondary | ICD-10-CM

## 2012-01-23 LAB — CBC WITH DIFFERENTIAL/PLATELET
Basophils Absolute: 0 10*3/uL (ref 0.0–0.1)
Eosinophils Absolute: 0.2 10*3/uL (ref 0.0–0.7)
Hemoglobin: 13.2 g/dL (ref 13.0–17.0)
Lymphocytes Relative: 31.7 % (ref 12.0–46.0)
MCHC: 33.4 g/dL (ref 30.0–36.0)
Monocytes Relative: 12.7 % — ABNORMAL HIGH (ref 3.0–12.0)
Neutrophils Relative %: 49.9 % (ref 43.0–77.0)
Platelets: 253 10*3/uL (ref 150.0–400.0)
RDW: 13.3 % (ref 11.5–14.6)

## 2012-01-23 LAB — BASIC METABOLIC PANEL
BUN: 14 mg/dL (ref 6–23)
Chloride: 102 mEq/L (ref 96–112)
GFR: 60.51 mL/min (ref >60.00)
Glucose, Bld: 93 mg/dL (ref 70–99)
Potassium: 4.7 mEq/L (ref 3.5–5.1)
Sodium: 135 mEq/L (ref 135–145)

## 2012-01-23 LAB — HEPATIC FUNCTION PANEL
ALT: 21 U/L (ref 0–53)
AST: 19 U/L (ref 0–37)
Albumin: 3.9 g/dL (ref 3.5–5.2)
Alkaline Phosphatase: 58 U/L (ref 39–117)
Bilirubin, Direct: 0.1 mg/dL (ref 0.0–0.3)
Total Protein: 6.6 g/dL (ref 6.0–8.3)

## 2012-01-23 LAB — URINALYSIS
Hgb urine dipstick: NEGATIVE
Leukocytes, UA: NEGATIVE
Specific Gravity, Urine: 1.015 (ref 1.000–1.030)
Urine Glucose: NEGATIVE
Urobilinogen, UA: 0.2 (ref 0.0–1.0)

## 2012-01-23 LAB — TSH: TSH: 1.59 u[IU]/mL (ref 0.35–5.50)

## 2012-01-23 LAB — LIPID PANEL
Cholesterol: 161 mg/dL (ref 0–200)
LDL Cholesterol: 79 mg/dL (ref 0–99)
VLDL: 5.4 mg/dL (ref 0.0–40.0)

## 2012-01-23 LAB — PSA: PSA: 1.22 ng/mL (ref 0.10–4.00)

## 2012-01-23 NOTE — Patient Instructions (Addendum)
Today we updated your med list in our EPIC system...    Continue your current medications the same...    We refilled your meds per request...  Today we did your follow up CXR, EKG, & FASTING blood work...    We will contact you w/ the results when avail...  We will arrange for a Cardiology eval as we discussed...  Today we gave you the 2013 Flu vaccine & the combination tetanus shot called the TDAP (good for 10 yrs)...  Call for any problems...  Let's continue our yearly f/u visits.Marland KitchenMarland Kitchen

## 2012-01-23 NOTE — Progress Notes (Signed)
Subjective:    Patient ID: Jeff Graves, male    DOB: 1945-03-09, 66 y.o.   MRN: [id number]  HPI 66 y/o WM here for an add-on visit... he is followed for HBP & Chol, w/ a past hx of kidney stones & cspine dz...   ~  January 19, 2010:  he states that Derm told him he had a form of Psoriasis & Rx w/ topical cream & improved... he's had a good year- no new complaints or concerns... BP controlled ;  FLP looks good;  GI stable & colon due in 2014;  he had shoulder injection from Ortho ?rotator cuff prob?[name]..   ~  January 22, 2011:  Yearly ROV & CPX> he reports a good yr, no new complaints or concerns;  BP controlled on Cozaar;  Chol looks good on Lip10; Arthritis controlled on nCelebrex which he takes prn & tolerates well;  He requests refills for 30d supplies;  OK 2012 Flu vaccine today...  Due for f/u CXR (clear, NAD), EKG (SBrady), Fasting labs (all essen wnl) today   ~  January 23, 2012:  Yearly ROV & CPX> Brett Canales reports another good yr w/o new complaints or concerns; he does, however, c/o some intermittent dizziness after strenuous activity- no CP or palpit or syncope; this is vague & he exercises at the gym on treadmill w/o difficulty he says; he notes several friends w/ MIs & CABG- he is concerned and asking for Cardiac eval "to see what's going on"; Note: he does have marked SBrady (no BBlockers) and Cardiac eval w/ treadmill & holter is warranted...    HBP> on Losar100; BP= 118/68 w/o postural change; he denies CP, palpit, SOB, edema; but notes the intermittent dizziness w/ strenuous activity- refer to Cards...    SBrady> he has marked sinus brady & not on BBlockers, etc; notes some dizziness w/ strenuous activ but no syncope; suggest Cardiac eval w/ treadmill & holter for further eval...    Chol> on Lip20-1/2 tab daily; FLP showed TChol 161, TG 27, HDL 77, LDL 79    GI- Divertics, Hems> no GI complaints- w/o abd pain, n/v, c/d, blood seen; last colon was 2004 & f/u due soon...    DJD> on  Celebrex200 prn; known DDD Cspine w/ mild sp stenosis- treated concervatuively & doing satis;     Kidney Stones> hx several stones in the past; he passed them all, last 6-93yrs ago; no current symptoms...    Seborrheic Dermatitis> told by [place] that it was a form of Psoriasis 7 treated w/ a topical cream- improved... We reviewed prob list, meds, xrays and labs> see below for updates >> given 2013 Flu shot & TDAP today... EKG 12/13 showed SBrady, rate39, NAD, borderline tracing... LABS 12/13:  FLP- at goals on Lip10;  Chems- wnl;  CBC- wnl;  TSH=1.59;  PSA=1.22;  UA- clear          Problem List:    HYPERTENSION (ICD-401.9) - controlled on LOSARTAN 100mg /d... ~  12/10: notes sl postural dizziness on & off so we decided to change Hyzaar to LOSARTAN 100mg /d. ~  12/11:  doing very well on the Losartan 100mg /d- BP 110/70 & similar at home;  CXR is clear, WNL;  EKG shows sinus brady, NAD... ~  12/12:  BP= 120/68 & he remains asymptomatic;  CXR remains clear;  EKG shows SBrady rate46 otherw wnl... ~  12/13: on Losar100; BP= 118/68 w/o postural change; he denies CP, palpit, SOB, edema; but notes the intermittent  dizziness w/ strenuous activity- refer to Cards.  Sinus Bradycardia> he has marked sinus brady & not on BBlockers, etc; notes some dizziness w/ strenuous activ but no syncope; suggest Cardiac eval w/ treadmill & holter for further eval...  ~  EKG 12/13 showed SBrady, rate39, NAD, borderline tracing...  HYPERCHOLESTEROLEMIA (ICD-272.0) - controlled on diet and LIPITOR 10mg /d... ~  FLP 11/07 showed TChol 147, TG 24, HDL 61, LDL 81... ~  FLP 12/08 showed TChol 167, TG 25, HDL 69, LDL 93... continue same Rx. ~  FLP 12.09 showed TChol 163, TG 20, HDL 66. LDL 93... rec- same. ~  FLP 12/10 showed TChol 161, TG 20, HDL 83, LDL 74 ~  FLP 12/11 on Lip10 showed TChol 155, TG 24, HDL 66, LDL 84 ~  FLP 12/12 on Lip10 showed TChol 166, TG 17, HDL 74, LDL 89 ~  FLP 12/13 on Lip10 showed TChol 161,  TG 27, HDL 77, LDL 79  DIVERTICULITIS, COLON (ICD-562.11) & HEMORRHOIDS (ICD-455.6) - last colonoscopy 11/04 by [name] w/ divertics & int hems, f/u planned 60yrs per GI protocol...  DEGENERATIVE DISC DISEASE, CERVICAL SPINE (ICD-722.4) & SHOULDER PAIN (ICD-719.41) - eval 6/97 by Dagoberto Reef w/ MRI showing DDD & mild sp stenosis w/o nerve root impingement - Rx conservatively;  increased pain in 2/06 w/ eval DrElsner and MRI showing spondylosis at 3 levels, again Rx conserv...  he uses CELEBREX 200mg  Prn for pain...  RENAL CALCULUS, HX OF (ICD-V13.01) - he's had 3 stones in the past and managed to pass all of them... last about 6 years ago... no prob currently.  RASH >> looked like seb dermatitis to me but Derm told him it was a form of Psoriasis & Rx by DrAJordan w/ topical cream==> improved...  Health Maint - on ASA 81mg /d... he is up-to-date on his Colonoscopy (last 2004 w/ f/u in 66yrs), PSA (12/12= 2.08), and vaccinations (flu shot today) & had pneumovax 8/03 & tetanus 8/03...   No past surgical history on file.   Outpatient Encounter Prescriptions as of 01/23/2012  Medication Sig Dispense Refill  . aspirin 325 MG tablet Take 325 mg by mouth daily.        [name] atorvastatin (LIPITOR) 20 MG tablet Take 1 tablet (20 mg total) by mouth daily. Take as directed  30 tablet  11  . celecoxib (CELEBREX) 200 MG capsule Take 1 capsule (200 mg total) by mouth daily as needed (for arthritis pain).  30 capsule  11  . losartan (COZAAR) 100 MG tablet Take 1 tablet (100 mg total) by mouth daily.  30 tablet  11    No Known Allergies   Current Medications, Allergies, Past Medical History, Past Surgical History, Family History, and Social History were reviewed in [employer] record.    Review of Systems    The patient denies fever, chills, sweats, anorexia, fatigue, weakness, malaise, weight loss, sleep disorder, blurring, diplopia, eye irritation, eye discharge, vision loss, eye  pain, photophobia, earache, ear discharge, tinnitus, decreased hearing, nasal congestion, nosebleeds, sore throat, hoarseness, chest pain, palpitations, syncope, dyspnea on exertion, orthopnea, PND, peripheral edema, cough, dyspnea at rest, excessive sputum, hemoptysis, wheezing, pleurisy, nausea, vomiting, diarrhea, constipation, change in bowel habits, abdominal pain, melena, hematochezia, jaundice, gas/bloating, indigestion/heartburn, dysphagia, odynophagia, dysuria, hematuria, urinary frequency, urinary hesitancy, nocturia, incontinence, back pain, joint pain, joint swelling, muscle cramps, muscle weakness, stiffness, arthritis, sciatica, restless legs, leg pain at night, leg pain with exertion, rash, itching, dryness, suspicious lesions, paralysis, paresthesias, seizures, tremors, vertigo, transient  blindness, frequent falls, frequent headaches, difficulty walking, depression, anxiety, memory loss, confusion, cold intolerance, heat intolerance, polydipsia, polyphagia, polyuria, unusual weight change, abnormal bruising, bleeding, enlarged lymph nodes, urticaria, allergic rash, hay fever, and recurrent infections.     Objective:   Physical Exam    WD, WN, 66 y/o WM in NAD... GENERAL:  Alert & oriented; pleasant & cooperative... HEENT:  [place]/AT, EOM-wnl, PERRLA, EACs-clear, TMs-wnl, NOSE-clear, THROAT-clear & wnl. NECK:  Supple w/ fairROM; no JVD; normal carotid impulses w/o bruits; no thyromegaly or nodules palpated; no lymphadenopathy. CHEST:  Clear to P & A; without wheezes/ rales/ or rhonchi. HEART:  Regular Rhythm; without murmurs/ rubs/ or gallops. ABDOMEN:  Soft & nontender; normal bowel sounds; no organomegaly or masses detected. RECTAL:  Neg - prostate 2+ & nontender w/o nodules; stool hematest neg. EXT: without deformities or arthritic changes; no varicose veins/ venous insuffic/ or edema. NEURO:  CN's intact; motor testing normal; sensory testing normal; gait normal & balance OK. DERM:   Improved seborrheic dermatitis in scalp (dandruff), some on face, and arms...  RADIOLOGY DATA:  Reviewed in the EPIC EMR & discussed w/ the patient...  LABORATORY DATA:  Reviewed in the EPIC EMR & discussed w/ the patient...   Assessment & Plan:   CPX>>    HBP>  Controlled on Losartan; continue same...  SBrady>  he has marked sinus brady & not on BBlockers, etc; notes some dizziness w/ strenuous activ but no syncope; suggest Cardiac eval w/ treadmill & holter for further eval...   GI> Divertics, Hems>  Stable, asymptomatic, up to date on colon screen due 11/14...  Hx Kidney Stones>  Stable, no recurrence...  DJD> CSpine, Shoulder pain>  He uses Celebrex as needed...  Other medical problems as noted...   Patient's Medications  New Prescriptions   No medications on file  Previous Medications   ASPIRIN 325 MG TABLET    Take 325 mg by mouth daily.     CELECOXIB (CELEBREX) 200 MG CAPSULE    Take 1 capsule (200 mg total) by mouth daily as needed (for arthritis pain).   LOSARTAN (COZAAR) 100 MG TABLET    Take 1 tablet (100 mg total) by mouth daily.  Modified Medications   Modified Medication Previous Medication   ATORVASTATIN (LIPITOR) 20 MG TABLET atorvastatin (LIPITOR) 20 MG tablet      Take as directed    Take 1 tablet (20 mg total) by mouth daily. Take as directed  Discontinued Medications   No medications on file

## 2012-01-26 ENCOUNTER — Encounter: Payer: Self-pay | Admitting: Pulmonary Disease

## 2012-02-03 ENCOUNTER — Telehealth: Payer: Self-pay | Admitting: Pulmonary Disease

## 2012-02-03 MED ORDER — LOSARTAN POTASSIUM 100 MG PO TABS: 100.0000 mg | ORAL_TABLET | Freq: Every day | ORAL | Status: DC

## 2012-02-03 MED ORDER — CELECOXIB 200 MG PO CAPS: 200.0000 mg | ORAL_CAPSULE | Freq: Every day | ORAL | Status: DC | PRN

## 2012-02-03 MED ORDER — ATORVASTATIN CALCIUM 10 MG PO TABS: 10.0000 mg | ORAL_TABLET | Freq: Every day | ORAL | Status: DC

## 2012-02-03 NOTE — Telephone Encounter (Signed)
Notes Recorded by Marcellus Scott, CMA on 01/29/2012 at 11:24 AM Attempted to call pt at home number---unable to leave a message. Will try back later. ------  Notes Recorded by Michele Mcalpine, MD on 01/26/2012 at 4:45 PM Please notify patient>  FLP looks good on Lip10> continue same... Chems, CBC, Thyroid, PSA, Urine> all WNL...  Notes Recorded by Michele Mcalpine, MD on 01/26/2012 at 4:46 PM Please notify patient>  CXR is clear & WNL.Marland KitchenMarland Kitchen  pt advised of results. Pt also requests refills be sent for all meds. Carron Curie, CMA

## 2012-02-20 ENCOUNTER — Ambulatory Visit: Payer: BC Managed Care – PPO | Admitting: Cardiovascular Disease

## 2012-03-12 ENCOUNTER — Ambulatory Visit (INDEPENDENT_AMBULATORY_CARE_PROVIDER_SITE_OTHER): Payer: BC Managed Care – PPO | Admitting: Cardiovascular Disease

## 2012-03-12 ENCOUNTER — Encounter: Payer: Self-pay | Admitting: Cardiovascular Disease

## 2012-03-12 VITALS — BP 136/77 | HR 49 | Ht 70.0 in | Wt 159.0 lb

## 2012-03-12 DIAGNOSIS — R001 Bradycardia, unspecified: Secondary | ICD-10-CM

## 2012-03-12 DIAGNOSIS — R42 Dizziness and giddiness: Secondary | ICD-10-CM

## 2012-03-12 DIAGNOSIS — I498 Other specified cardiac arrhythmias: Secondary | ICD-10-CM

## 2012-03-12 NOTE — Patient Instructions (Addendum)
Your physician recommends that you schedule a follow-up appointment in: 3-4 weeks.   Your physician has requested that you have an exercise tolerance test. To be done with NP or PA.  For further information please visit [email or web address] Please also follow instruction sheet, as given.  Your physician has requested that you have an echocardiogram. Echocardiography is a painless test that uses sound waves to create images of your heart. It provides your doctor with information about the size and shape of your heart and how well your heart's chambers and valves are working. This procedure takes approximately one hour. There are no restrictions for this procedure.    Your physician has recommended that you wear a holter monitor. Holter monitors are medical devices that record the heart's electrical activity. Doctors most often use these monitors to diagnose arrhythmias. Arrhythmias are problems with the speed or rhythm of the heartbeat. The monitor is a small, portable device. You can wear one while you do your normal daily activities. This is usually used to diagnose what is causing palpitations/syncope (passing out).

## 2012-03-12 NOTE — Progress Notes (Signed)
History of Present Illness: 67 yo white male with history of HTN, HLD but no known cardiac disease referred today for evaluation of dizziness. He tells me that he has been having "spells" where he feels weak, dizzy and diaphoretic. This typically happens after he does something strenuous. No exertional chest pains or SOB but he does feel an unusual feeling in his chest sometimes when he is dizzy. He exercises on the treadmill several times per week without chest pain or SOB and no dizziness during exercise.   Primary Care Physician: Dr. Kriste Basque  Last Lipid Profile:Lipid Panel     Component Value Date/Time   CHOL 161 01/23/2012 1011   TRIG 27.0 01/23/2012 1011   HDL 76.60 01/23/2012 1011   CHOLHDL 2 01/23/2012 1011   VLDL 5.4 01/23/2012 1011   LDLCALC 79 01/23/2012 1011     Past Medical History  Diagnosis Date  . Hypertension   . Hypercholesterolemia   . Diverticulosis of colon   . Hemorrhoids   . Renal calculus   . DDD (degenerative disc disease)   . Shoulder pain     Past Surgical History  Procedure Date  . None     Current Outpatient Prescriptions  Medication Sig Dispense Refill  . aspirin 325 MG tablet Take 325 mg by mouth daily.        [name] atorvastatin (LIPITOR) 10 MG tablet Take 1 tablet (10 mg total) by mouth daily.  30 tablet  11  . celecoxib (CELEBREX) 200 MG capsule Take 1 capsule (200 mg total) by mouth daily as needed (for arthritis pain).  30 capsule  11  . losartan (COZAAR) 100 MG tablet Take 1 tablet (100 mg total) by mouth daily.  30 tablet  11    No Known Allergies  History   Social History  . Marital Status: Married    Spouse Name: brenda    Number of Children: 1  . Years of Education: N/A   Occupational History  . HVAC with air quality    Social History Main Topics  . Smoking status: Former Smoker -- 0.5 packs/day for 20 years    Types: Cigarettes    Quit date: 02/12/1984  . Smokeless tobacco: Not on file  . Alcohol Use: No     Comment:  social use  . Drug Use: No  . Sexually Active: Not on file   Other Topics Concern  . Not on file   Social History Narrative  . No narrative on file    Family History  Problem Relation Age of Onset  . Heart attack Father 38  . Valvular heart disease Mother 79  . Lymphoma Sister     Deceased  . Breast cancer Sister     Alive    Review of Systems:  As stated in the HPI and otherwise negative.   BP 136/77  Pulse 49  Ht 5\' 10"  (1.778 m)  Wt 159 lb (72.122 kg)  BMI 22.81 kg/m2  Physical Examination: General: Well developed, well nourished, NAD HEENT: OP clear, mucus membranes moist SKIN: warm, dry. No rashes. Neuro: No focal deficits Musculoskeletal: Muscle strength 5/5 all ext Psychiatric: Mood and affect normal Neck: No JVD, no carotid bruits, no thyromegaly, no lymphadenopathy. Lungs:Clear bilaterally, no wheezes, rhonci, crackles Cardiovascular: Brady, No murmurs, gallops or rubs. Abdomen:Soft. Bowel sounds present. Non-tender.  Extremities: No lower extremity edema. Pulses are 2 + in the bilateral DP/PT.  EKG: 01/23/12: Sinus brady, rate 39 bpm  Assessment and Plan:  1. Dizziness: He has sinus brady. He has had a slow heart rate his entire life but no episodes of dizziness before. This is not exertional. No chest pain or SOB when he exercises and he uses the treadmill several times per week. Will arrange echo to exclude structural heart disease and assess LVEF. Will arrange 48 hour monitor to exclude heart block. Will arrange exercise treadmill stress test to assess heart rate response to exercise. He may need carotids in the future as well.

## 2012-03-20 ENCOUNTER — Ambulatory Visit (INDEPENDENT_AMBULATORY_CARE_PROVIDER_SITE_OTHER): Payer: BC Managed Care – PPO

## 2012-03-20 ENCOUNTER — Ambulatory Visit (HOSPITAL_COMMUNITY): Payer: BC Managed Care – PPO | Attending: Cardiovascular Disease

## 2012-03-20 DIAGNOSIS — I080 Rheumatic disorders of both mitral and aortic valves: Secondary | ICD-10-CM | POA: Insufficient documentation

## 2012-03-20 DIAGNOSIS — R42 Dizziness and giddiness: Secondary | ICD-10-CM

## 2012-03-20 DIAGNOSIS — I379 Nonrheumatic pulmonary valve disorder, unspecified: Secondary | ICD-10-CM | POA: Insufficient documentation

## 2012-03-20 DIAGNOSIS — I10 Essential (primary) hypertension: Secondary | ICD-10-CM | POA: Insufficient documentation

## 2012-03-20 DIAGNOSIS — R002 Palpitations: Secondary | ICD-10-CM

## 2012-03-20 DIAGNOSIS — I369 Nonrheumatic tricuspid valve disorder, unspecified: Secondary | ICD-10-CM | POA: Insufficient documentation

## 2012-03-20 NOTE — Progress Notes (Signed)
Echocardiogram performed.  

## 2012-03-20 NOTE — Progress Notes (Signed)
Placed a holter monitor on patient and went over instruction on how to use and when to return it

## 2012-03-25 ENCOUNTER — Ambulatory Visit (INDEPENDENT_AMBULATORY_CARE_PROVIDER_SITE_OTHER): Payer: BC Managed Care – PPO | Admitting: Physician Assistant

## 2012-03-25 DIAGNOSIS — R42 Dizziness and giddiness: Secondary | ICD-10-CM

## 2012-03-25 NOTE — Progress Notes (Signed)
Exercise Treadmill Test  Pre-Exercise Testing Evaluation Rhythm: sinus bradycardia  Rate: 47                 Test  Exercise Tolerance Test Ordering MD: Melene Muller, MD  Interpreting MD: Tereso Newcomer, PA-C  Unique Test No: 1  Treadmill:  1  Indication for ETT: Dizziness  Contraindication to ETT: No   Stress Modality: exercise - treadmill  Cardiac Imaging Performed: non   Protocol: standard Bruce - maximal  Max BP:  192/94  Max MPHR (bpm):  155 85% MPR (bpm):  132  MPHR obtained (bpm):  146 % MPHR obtained:  96%  Reached 85% MPHR (min:sec):  8:28 Total Exercise Time (min-sec):  10:00  Workload in METS:  11.3 Borg Scale: 19  Reason ETT Terminated:  patient's desire to stop    ST Segment Analysis At Rest: normal ST segments - no evidence of significant ST depression With Exercise: non-specific ST changes  Other Information Arrhythmia:  No Angina during ETT:  absent (0) Quality of ETT:  diagnostic  ETT Interpretation:  normal - no evidence of ischemia by ST analysis  Comments: Good exercise tolerance. No chest pain. Normal BP response to exercise. Normal HR response to exercise. Insignificant up-sloping ST depression noted at max exercise. No changes to suggest ischemia.  Recommendations: Follow up with Dr. Verne Carrow as directed. Luna Glasgow, PA-C  11:32 AM 03/25/2012

## 2012-04-10 ENCOUNTER — Ambulatory Visit (INDEPENDENT_AMBULATORY_CARE_PROVIDER_SITE_OTHER): Payer: BC Managed Care – PPO | Admitting: Cardiovascular Disease

## 2012-04-10 ENCOUNTER — Encounter: Payer: Self-pay | Admitting: Cardiovascular Disease

## 2012-04-10 VITALS — BP 130/80 | HR 52 | Ht 70.0 in | Wt 160.0 lb

## 2012-04-10 DIAGNOSIS — R42 Dizziness and giddiness: Secondary | ICD-10-CM

## 2012-04-10 DIAGNOSIS — I498 Other specified cardiac arrhythmias: Secondary | ICD-10-CM

## 2012-04-10 DIAGNOSIS — I491 Atrial premature depolarization: Secondary | ICD-10-CM

## 2012-04-10 DIAGNOSIS — R001 Bradycardia, unspecified: Secondary | ICD-10-CM

## 2012-04-10 NOTE — Progress Notes (Addendum)
History of Present Illness: 67 yo white male with history of HTN, HLD but no known cardiac disease who I saw 03/12/12 as a new patient for evaluation of dizziness. He told me that he has been having "spells" where he feels weak, dizzy and diaphoretic. This typically happens after he does something strenuous. No exertional chest pains or SOB but he does feel an unusual feeling in his chest sometimes when he is dizzy. He exercises on the treadmill several times per week without chest pain or SOB and no dizziness during exercise. I arranged an echo on 03/20/12 which showed normal LV size and function with mild valvular heart disease (mild AI, mild MR, mild to moderate PI). Exercise treadmill stress test with no evidence of ischemia or exercise induced arrythmias. 48 hour Holter monitor was arranged and showed sinus brady with PACs. His bradycardia is as slow as 30 bpm but I cannot correlate this with his symptoms.   He is here today for follow up. He has had several episodes of dizziness but no near syncope or syncope. No palpitations. No chest pain or SOB.   Primary Care Physician: Dr. Kriste Basque  Past Medical History  Diagnosis Date  . Hypertension   . Hypercholesterolemia   . Diverticulosis of colon   . Hemorrhoids   . Renal calculus   . DDD (degenerative disc disease)   . Shoulder pain   . Sinus bradycardia   . PAC (premature atrial contraction)     Past Surgical History  Procedure Laterality Date  . None      Current Outpatient Prescriptions  Medication Sig Dispense Refill  . aspirin 325 MG tablet Take 325 mg by mouth daily.        [name] atorvastatin (LIPITOR) 10 MG tablet Take 1 tablet (10 mg total) by mouth daily.  30 tablet  11  . celecoxib (CELEBREX) 200 MG capsule Take 1 capsule (200 mg total) by mouth daily as needed (for arthritis pain).  30 capsule  11  . losartan (COZAAR) 100 MG tablet Take 1 tablet (100 mg total) by mouth daily.  30 tablet  11   No current facility-administered  medications for this visit.    No Known Allergies  History   Social History  . Marital Status: Married    Spouse Name: brenda    Number of Children: 1  . Years of Education: N/A   Occupational History  . HVAC with air quality    Social History Main Topics  . Smoking status: Former Smoker -- 0.50 packs/day for 20 years    Types: Cigarettes    Quit date: 02/12/1984  . Smokeless tobacco: Not on file  . Alcohol Use: No     Comment: social use  . Drug Use: No  . Sexually Active: Not on file   Other Topics Concern  . Not on file   Social History Narrative  . No narrative on file    Family History  Problem Relation Age of Onset  . Heart attack Father 67  . Valvular heart disease Mother 24  . Lymphoma Sister     Deceased  . Breast cancer Sister     Alive    Review of Systems:  As stated in the HPI and otherwise negative.   BP 130/80  Pulse 52  Ht 5\' 10"  (1.778 m)  Wt 160 lb (72.576 kg)  BMI 22.96 kg/m2  Physical Examination: General: Well developed, well nourished, NAD HEENT: OP clear, mucus membranes moist SKIN: warm,  dry. No rashes. Neuro: No focal deficits Musculoskeletal: Muscle strength 5/5 all ext Psychiatric: Mood and affect normal Neck: No JVD, no carotid bruits, no thyromegaly, no lymphadenopathy. Lungs:Clear bilaterally, no wheezes, rhonci, crackles Cardiovascular: Huston Foley with regular rhythm. No murmurs, gallops or rubs. Abdomen:Soft. Bowel sounds present. Non-tender.  Extremities: No lower extremity edema. Pulses are 2 + in the bilateral DP/PT.  Echo: 03/20/12: Left ventricle: The cavity size was normal. Wall thickness was normal. Systolic function was normal. The estimated ejection fraction was in the range of 55% to 60%. Doppler parameters are consistent with abnormal left ventricular relaxation (grade 1 diastolic dysfunction). - Aortic valve: Mild regurgitation. - Mitral valve: Mild regurgitation. - Pulmonic valve: Moderate regurgitation. -  Pulmonary arteries: PA peak pressure: 33mm Hg (S).  Exercise treadmill stress test 03/25/12: ST Segment Analysis  At Rest: normal ST segments - no evidence of significant ST depression  With Exercise: non-specific ST changes  Other Information  Arrhythmia: No Angina during ETT: absent (0)  Quality of ETT: diagnostic  ETT Interpretation: normal - no evidence of ischemia by ST analysis  Comments:  Good exercise tolerance.  No chest pain.  Normal BP response to exercise.  Normal HR response to exercise.  Insignificant up-sloping ST depression noted at max exercise.  No changes to suggest ischemia.  Assessment and Plan:   1. Dizziness: Echo overall normal. Stress test normal as above. 48 hour monitor (only preliminary report and strips available) is reviewed with my EP colleagues this am and shows sinus rhythm with periods of profound sinus bradycardia. (30 bpm). TSH and BMET ok recently. He is on no AV nodal blocking agents.  I will arrange a 21 day event monitor and will have him see Dr. Graciela Husbands in the EP clinic in 4 weeks. He is advised to call us if he has any syncopal events. He is instructed to come into the ED for any change in his clinical status.    Addendum 05/08/12: Event monitor with sinus brady, PAC, PVCs and SVT. He has tachy brady syndrome. Appt with Dr. Graciela Husbands on 06/04/12 in EP clinic.

## 2012-04-10 NOTE — Patient Instructions (Signed)
Your physician recommends that you schedule a follow-up appointment in:  Dr. Graciela Husbands in 1 month  Your physician has recommended that you wear an event monitor. Event monitors are medical devices that record the heart's electrical activity. Doctors most often [place] these monitors to diagnose arrhythmias. Arrhythmias are problems with the speed or rhythm of the heartbeat. The monitor is a small, portable device. You can wear one while you do your normal daily activities. This is usually used to diagnose what is causing palpitations/syncope (passing out).

## 2012-04-13 ENCOUNTER — Encounter (INDEPENDENT_AMBULATORY_CARE_PROVIDER_SITE_OTHER): Payer: BC Managed Care – PPO

## 2012-04-13 ENCOUNTER — Telehealth: Payer: Self-pay | Admitting: *Deleted

## 2012-04-13 DIAGNOSIS — I498 Other specified cardiac arrhythmias: Secondary | ICD-10-CM

## 2012-04-13 DIAGNOSIS — R42 Dizziness and giddiness: Secondary | ICD-10-CM

## 2012-04-13 DIAGNOSIS — R001 Bradycardia, unspecified: Secondary | ICD-10-CM

## 2012-04-13 NOTE — Telephone Encounter (Signed)
21 day event monitor placed on Pt 04/13/12 TK

## 2012-04-20 ENCOUNTER — Telehealth: Payer: Self-pay | Admitting: *Deleted

## 2012-04-20 NOTE — Telephone Encounter (Signed)
Thanks, chris 

## 2012-04-20 NOTE — Telephone Encounter (Signed)
Received fax from [facility] indicating pt had run SVT on April 17, 2012 at 3:54 PM. Dr. Clifton James reviewed strips and asked me to call pt and see how he was feeling.  I spoke with pt who reports he was shoveling snow at that time. He was aware of increased heart rate but was not having any dizziness or other symptoms. He did receive a call from [facility] at that time.  Also states he received a call from [facility] on April 18, 2012 while he was shoveling snow.  He reports he is feeling well.

## 2012-06-04 ENCOUNTER — Ambulatory Visit (INDEPENDENT_AMBULATORY_CARE_PROVIDER_SITE_OTHER): Payer: BC Managed Care – PPO | Admitting: Internal Medicine

## 2012-06-04 ENCOUNTER — Encounter: Payer: Self-pay | Admitting: Internal Medicine

## 2012-06-04 VITALS — BP 130/93 | HR 44 | Ht 69.0 in | Wt 160.0 lb

## 2012-06-04 DIAGNOSIS — I471 Supraventricular tachycardia: Secondary | ICD-10-CM | POA: Insufficient documentation

## 2012-06-04 DIAGNOSIS — I498 Other specified cardiac arrhythmias: Secondary | ICD-10-CM

## 2012-06-04 DIAGNOSIS — I4719 Other supraventricular tachycardia: Secondary | ICD-10-CM

## 2012-06-04 HISTORY — DX: Supraventricular tachycardia: I47.1

## 2012-06-04 HISTORY — DX: Other supraventricular tachycardia: I47.19

## 2012-06-04 MED ORDER — FLECAINIDE ACETATE 100 MG PO TABS: 100.0000 mg | ORAL_TABLET | Freq: Two times a day (BID) | ORAL | Status: DC

## 2012-06-04 NOTE — Patient Instructions (Signed)
Your physician has recommended you make the following change in your medication:  1) Decrease aspirin to 81 mg one tablet by mouth daily. 2) Start flecainide 100 mg one tablet by mouth twice daily.  Your physician has requested that you have an exercise tolerance test in 5-7 days with the PA/ NP. For further information please visit [email or web address] Please also follow instruction sheet, as given.  Your physician recommends that you schedule a follow-up appointment in: 8-10 weeks with Dr. Graciela Husbands.

## 2012-06-04 NOTE — Progress Notes (Signed)
ELECTROPHYSIOLOGY CONSULT NOTE  Patient ID: Jeff Graves, MRN: [id number], DOB/AGE: 09/04/1945 67 y.o. Admit date: (Not on file) Date of Consult: 06/04/2012  Primary Physician: Michele Mcalpine, MD Primary Cardiologist: [facility]   Chief Complaint:  Bradycardia   HPI Jeff Graves is a 67 y.o. male  Seen at the request of Dr. Caryl Ada because of bradycardia and dizziness.  These episodes have been going on about 3 years. Dizziness seemed to be primarily related but not solely related to exertion. He was seen by his PCP in December and found to have resting bradycardia at 39 beats per minute and a Holter monitor confirmed bradycardia particularly nocturnal bradycardia. There is no close association between bradycardia on his Holter in his symptoms. Hence he was given an event recorder  He also has a history of hypertension and hyperlipidemia. Results of dizziness. Cardiac evaluation included an echo 2/14 demonstrated normal left ventricular function with mild lobular disease. Treadmill testing demonstrated no ischemia. Holter monitor showed some sinus rhythm with bradycardia and PACs and heart rates as low as 30.he was given an event recorder.  Event recorder demonstrated a number of episodes of abrupt onset and offset tachycardia. These were associated mostly with exertion but sometimes he was unaware of them was only aware of because of a phone call from monitoring. He has noted these episodes dizziness primarily but not slowly with exertion. He also notes some association with tachycardia palpitations  Past Medical History  Diagnosis Date  . Hypertension   . Hypercholesterolemia   . Diverticulosis of colon   . Hemorrhoids   . Renal calculus   . DDD (degenerative disc disease)   . Shoulder pain   . Sinus bradycardia   . PAC (premature atrial contraction)       Surgical History:  Past Surgical History  Procedure Laterality Date  . None       Home Meds: Prior to Admission  medications   Medication Sig Start Date End Date Taking? Authorizing Provider  aspirin 325 MG tablet Take 325 mg by mouth daily.     Yes Historical Provider, MD  atorvastatin (LIPITOR) 10 MG tablet Take 1 tablet (10 mg total) by mouth daily. 02/03/12  Yes Michele Mcalpine, MD  celecoxib (CELEBREX) 200 MG capsule Take 1 capsule (200 mg total) by mouth daily as needed (for arthritis pain). 02/03/12  Yes Michele Mcalpine, MD  loratadine (CLARITIN) 10 MG tablet Take 10 mg by mouth daily.   Yes Historical Provider, MD  losartan (COZAAR) 100 MG tablet Take 1 tablet (100 mg total) by mouth daily. 02/03/12  Yes Michele Mcalpine, MD     Allergies: No Known Allergies  History   Social History  . Marital Status: Married    Spouse Name: brenda    Number of Children: 1  . Years of Education: N/A   Occupational History  . HVAC with air quality    Social History Main Topics  . Smoking status: Former Smoker -- 0.50 packs/day for 20 years    Types: Cigarettes    Quit date: 02/12/1984  . Smokeless tobacco: Not on file  . Alcohol Use: No     Comment: social use  . Drug Use: No  . Sexually Active: Not on file   Other Topics Concern  . Not on file   Social History Narrative  . No narrative on file     Family History  Problem Relation Age of Onset  . Heart attack Father 35  .  Valvular heart disease Mother 43  . Lymphoma Sister     Deceased  . Breast cancer Sister     Alive     ROS:  Please see the history of present illness.     All other systems reviewed and negative.    Physical Exam:   Blood pressure 130/93, pulse 44, height 5\' 9"  (1.753 m), weight 160 lb (72.576 kg), SpO2 100.00%. General: Well developed, well nourished male in no acute distress. Head: Normocephalic, atraumatic, sclera non-icteric, no xanthomas, nares are without discharge. EENT: normal Lymph Nodes:  none Back: without scoliosis/kyphosis, no CVA tendersness Neck: Negative for carotid bruits. JVD not elevated. Lungs:  Clear bilaterally to auscultation without wheezes, rales, or rhonchi. Breathing is unlabored. Heart: RRR with S1 S2. No  murmur , rubs, or gallops appreciated. Abdomen: Soft, non-tender, non-distended with normoactive bowel sounds. No hepatomegaly. No rebound/guarding. No obvious abdominal masses. Msk:  Strength and tone appear normal for age. Extremities: No clubbing or cyanosis. No edema.  Distal pedal pulses are 2+ and equal bilaterally. Skin: Warm and Dry Neuro: Alert and oriented X 3. CN III-XII intact Grossly normal sensory and motor function . Psych:  Responds to questions appropriately with a normal affect.      Labs: Cardiac Enzymes No results found for this basename: CKTOTAL, CKMB, TROPONINI,  in the last 72 hours CBC Lab Results  Component Value Date   WBC 5.1 01/23/2012   HGB 13.2 01/23/2012   HCT 39.6 01/23/2012   MCV 94.7 01/23/2012   PLT 253.0 01/23/2012   PROTIME: No results found for this basename: LABPROT, INR,  in the last 72 hours Chemistry No results found for this basename: NA, K, CL, CO2, BUN, CREATININE, CALCIUM, LABALBU, PROT, BILITOT, ALKPHOS, ALT, AST, GLUCOSE,  in the last 168 hours Lipids Lab Results  Component Value Date   CHOL 161 01/23/2012   HDL 76.60 01/23/2012   LDLCALC 79 01/23/2012   TRIG 27.0 01/23/2012   BNP No results found for this basename: probnp   Miscellaneous No results found for this basename: DDIMER    Radiology/Studies:  No results found.  EKG:  12/13 demonstrated sinus rhythm at 39.   EVENT recorder a number of episodes of abrupt onset offset tachycardia with evidence of warm up as well as P waves distorting T waves consistent with atrial tachycardia. Heart rate dropped to 190 Assessment and Plan:    Sherryl Manges  Event

## 2012-06-04 NOTE — Assessment & Plan Note (Signed)
The patient has had abrupt onset and offset episodes of SVT. They are associated with dizziness frequently but not solely with exertion. Heart rate to the 190 range. These occur unfortunately in the context of sinus bradycardia with rates in the high 30s.  It appears that these are atrial tachycardia. P wave morphologies are hard to distinguish but it probably is right sided. We have discussed treatment options including following a long, rate control which is obviated by his sinus bradycardia, antiarrhythmic therapy probably limited to flecainide or dofetilide or catheter ablation. We have elected to pursue flecainide. We have reviewed potential pro-arrhythmic risk. He will be submitted for treadmill testing for assessment. We will review  In 8-10 weeks how he is doing. He is to call us if he has any problems with side effects.

## 2012-06-10 ENCOUNTER — Ambulatory Visit (INDEPENDENT_AMBULATORY_CARE_PROVIDER_SITE_OTHER): Payer: BC Managed Care – PPO | Admitting: Physician Assistant

## 2012-06-10 DIAGNOSIS — I471 Supraventricular tachycardia: Secondary | ICD-10-CM

## 2012-06-10 DIAGNOSIS — I498 Other specified cardiac arrhythmias: Secondary | ICD-10-CM

## 2012-06-10 NOTE — Progress Notes (Signed)
Exercise Treadmill Test  Pre-Exercise Testing Evaluation Rhythm: sinus bradycardia  Rate: 48   PR:  .24 QRS:  .10    Test  Exercise Tolerance Test Ordering MD: Sherryl Manges, MD  Interpreting MD: Tereso Newcomer, [facility]  Unique Test No: 1  Treadmill:  1  Indication for ETT: ATach, flecainide started 7 days ago  Contraindication to ETT: No   Stress Modality: exercise - treadmill  Cardiac Imaging Performed: non   Protocol: standard Bruce - maximal  Max BP:  198/89  Max MPHR (bpm):  155 85% MPR (bpm):  132  MPHR obtained (bpm):  137 % MPHR obtained:  87%  Reached 85% MPHR (min:sec):  10:50 Total Exercise Time (min-sec):  12:00  Workload in METS:  13.4 Borg Scale: 17  Reason ETT Terminated:  patient's desire to stop    ST Segment Analysis At Rest: normal ST segments - no evidence of significant ST depression With Exercise: no evidence of significant ST depression  Other Information Arrhythmia:  No Angina during ETT:  absent (0) Quality of ETT:  diagnostic  ETT Interpretation:  normal - no evidence of ischemia by ST analysis ; no pro-arrhythmia  Comments: Excellent exercise tolerance. No chest pain. Normal BP response to exercise. No ST-T changes to suggest ischemia.  No exercise induced ventricular arrhythmias.  Recommendations: ETT neg for pro-arrhythmia. Continue Flecainide. F/u with Dr. Sherryl Manges as planned. SignedTereso Newcomer, PA-C  10:07 AM 06/10/2012

## 2012-08-06 ENCOUNTER — Ambulatory Visit (INDEPENDENT_AMBULATORY_CARE_PROVIDER_SITE_OTHER): Payer: BC Managed Care – PPO | Admitting: Internal Medicine

## 2012-08-06 ENCOUNTER — Encounter: Payer: Self-pay | Admitting: Internal Medicine

## 2012-08-06 VITALS — BP 122/70 | HR 49 | Ht 70.0 in | Wt 157.0 lb

## 2012-08-06 DIAGNOSIS — I498 Other specified cardiac arrhythmias: Secondary | ICD-10-CM

## 2012-08-06 DIAGNOSIS — R42 Dizziness and giddiness: Secondary | ICD-10-CM | POA: Insufficient documentation

## 2012-08-06 DIAGNOSIS — I471 Supraventricular tachycardia: Secondary | ICD-10-CM

## 2012-08-06 NOTE — Patient Instructions (Signed)
Your physician has recommended you make the following change in your medication:  1) Decrease cozaar to 50 mg once daily.  Your physician wants you to follow-up in: 6 months with Dr. Graciela Husbands. You will receive a reminder letter in the mail two months in advance. If you don't receive a letter, please call our office to schedule the follow-up appointment.

## 2012-08-06 NOTE — Assessment & Plan Note (Signed)
Largely postural. His blood pressure is relatively well controlled and so we will decrease his Cozaar from 100--50.

## 2012-08-06 NOTE — Assessment & Plan Note (Signed)
No recurrent atrial tachycardia on flecainide. No significant shortness patient. We'll continue current medications and current dosing

## 2012-08-06 NOTE — Progress Notes (Signed)
Patient Care Team: Michele Mcalpine, MD as PCP - General (Pulmonary Disease)   HPI  Jeff Graves is a 67 y.o. male Seen in followup for atrial tachycardia. We elected at last visit April 2014 to undertake a trial of flecainide  He has had no recurrent tachypalpitations.  He has had some dizziness with standing and with mild exertion. His stress test had demonstrated normal blood pressure response and normal heart rate response to exercise.  He does have a history of hypertension  Past Medical History  Diagnosis Date  . Hypertension   . Hypercholesterolemia   . Diverticulosis of colon   . Hemorrhoids   . Renal calculus   . DDD (degenerative disc disease)   . Shoulder pain   . Sinus bradycardia   . PAC (premature atrial contraction)     Past Surgical History  Procedure Laterality Date  . None      Current Outpatient Prescriptions  Medication Sig Dispense Refill  . aspirin EC 81 MG tablet Take 1 tablet (81 mg total) by mouth daily.      [name] atorvastatin (LIPITOR) 10 MG tablet Take 1 tablet (10 mg total) by mouth daily.  30 tablet  11  . celecoxib (CELEBREX) 200 MG capsule Take 1 capsule (200 mg total) by mouth daily as needed (for arthritis pain).  30 capsule  11  . flecainide (TAMBOCOR) 100 MG tablet Take 1 tablet (100 mg total) by mouth 2 (two) times daily.  60 tablet  6  . loratadine (CLARITIN) 10 MG tablet Take 10 mg by mouth daily.      [name] losartan (COZAAR) 100 MG tablet Take 1 tablet (100 mg total) by mouth daily.  30 tablet  11   No current facility-administered medications for this visit.    No Known Allergies  Review of Systems negative except from HPI and PMH  Physical Exam BP 122/70  Pulse 49  Ht 5\' 10"  (1.778 m)  Wt 157 lb (71.215 kg)  BMI 22.53 kg/m2 Well developed and nourished in no acute distress HENT normal Neck supple with JVP-flat Clear Regular rate and rhythm, no murmurs or gallops Abd-soft with active BS No Clubbing cyanosis  edema Skin-warm and dry A & Oriented  Grossly normal sensory and motor function  ECG demonstrates sinus rhythm at 49 Interval 17/10/45   Assessment and  Plan

## 2012-09-26 ENCOUNTER — Encounter: Payer: Self-pay | Admitting: Gastroenterology

## 2013-01-02 ENCOUNTER — Other Ambulatory Visit: Payer: Self-pay | Admitting: Internal Medicine

## 2013-01-22 ENCOUNTER — Ambulatory Visit (INDEPENDENT_AMBULATORY_CARE_PROVIDER_SITE_OTHER): Payer: BC Managed Care – PPO | Admitting: Pulmonary Disease

## 2013-01-22 ENCOUNTER — Ambulatory Visit (INDEPENDENT_AMBULATORY_CARE_PROVIDER_SITE_OTHER)
Admission: RE | Admit: 2013-01-22 | Discharge: 2013-01-22 | Disposition: A | Discharge status: Home / Self Care | Payer: BC Managed Care – PPO | Source: Ambulatory Visit | Attending: Pulmonary Disease | Admitting: Pulmonary Disease

## 2013-01-22 ENCOUNTER — Encounter: Payer: Self-pay | Admitting: Gastroenterology

## 2013-01-22 ENCOUNTER — Encounter: Payer: Self-pay | Admitting: Pulmonary Disease

## 2013-01-22 ENCOUNTER — Other Ambulatory Visit (INDEPENDENT_AMBULATORY_CARE_PROVIDER_SITE_OTHER): Payer: BC Managed Care – PPO

## 2013-01-22 VITALS — BP 110/60 | HR 49 | Temp 97.1°F | Ht 70.0 in | Wt 160.6 lb

## 2013-01-22 DIAGNOSIS — I10 Essential (primary) hypertension: Secondary | ICD-10-CM

## 2013-01-22 DIAGNOSIS — I498 Other specified cardiac arrhythmias: Secondary | ICD-10-CM

## 2013-01-22 DIAGNOSIS — E7800 Pure hypercholesterolemia, unspecified: Secondary | ICD-10-CM

## 2013-01-22 DIAGNOSIS — M5412 Radiculopathy, cervical region: Secondary | ICD-10-CM

## 2013-01-22 DIAGNOSIS — I471 Supraventricular tachycardia: Secondary | ICD-10-CM

## 2013-01-22 DIAGNOSIS — N320 Bladder-neck obstruction: Secondary | ICD-10-CM

## 2013-01-22 DIAGNOSIS — I4719 Other supraventricular tachycardia: Secondary | ICD-10-CM

## 2013-01-22 DIAGNOSIS — K5730 Diverticulosis of large intestine without perforation or abscess without bleeding: Secondary | ICD-10-CM

## 2013-01-22 DIAGNOSIS — M5010 Cervical disc disorder with radiculopathy, unspecified cervical region: Secondary | ICD-10-CM | POA: Insufficient documentation

## 2013-01-22 DIAGNOSIS — Z87442 Personal history of urinary calculi: Secondary | ICD-10-CM

## 2013-01-22 LAB — CBC WITH DIFFERENTIAL/PLATELET
Basophils Absolute: 0 10*3/uL (ref 0.0–0.1)
Basophils Relative: 0.5 % (ref 0.0–3.0)
Eosinophils Absolute: 0.1 10*3/uL (ref 0.0–0.7)
Eosinophils Relative: 2.2 % (ref 0.0–5.0)
HCT: 40.7 % (ref 39.0–52.0)
Hemoglobin: 13.6 g/dL (ref 13.0–17.0)
MCHC: 33.5 g/dL (ref 30.0–36.0)
MCV: 94.2 fl (ref 78.0–100.0)
Monocytes Absolute: 0.6 10*3/uL (ref 0.1–1.0)
Neutro Abs: 3.3 10*3/uL (ref 1.4–7.7)
Neutrophils Relative %: 60.8 % (ref 43.0–77.0)
RBC: 4.32 Mil/uL (ref 4.22–5.81)
RDW: 13.4 % (ref 11.5–14.6)

## 2013-01-22 LAB — BASIC METABOLIC PANEL
CO2: 28 mEq/L (ref 19–32)
Chloride: 102 mEq/L (ref 96–112)
Creatinine, Ser: 1.4 mg/dL (ref 0.4–1.5)
GFR: 56.22 mL/min — ABNORMAL LOW (ref >60.00)
Sodium: 134 mEq/L — ABNORMAL LOW (ref 135–145)

## 2013-01-22 LAB — LIPID PANEL
LDL Cholesterol: 87 mg/dL (ref 0–99)
Total CHOL/HDL Ratio: 2
Triglycerides: 11 mg/dL (ref 0.0–149.0)

## 2013-01-22 LAB — HEPATIC FUNCTION PANEL
ALT: 22 U/L (ref 0–53)
Alkaline Phosphatase: 62 U/L (ref 39–117)
Bilirubin, Direct: 0.1 mg/dL (ref 0.0–0.3)
Total Bilirubin: 1.2 mg/dL (ref 0.3–1.2)
Total Protein: 6.9 g/dL (ref 6.0–8.3)

## 2013-01-22 LAB — PSA: PSA: 1.88 ng/mL (ref 0.10–4.00)

## 2013-01-22 MED ORDER — ATORVASTATIN CALCIUM 10 MG PO TABS: 10.0000 mg | ORAL_TABLET | Freq: Every day | ORAL | Status: DC

## 2013-01-22 MED ORDER — CELECOXIB 200 MG PO CAPS: 200.0000 mg | ORAL_CAPSULE | Freq: Every day | ORAL | Status: DC | PRN

## 2013-01-22 NOTE — Progress Notes (Signed)
Subjective:    Patient ID: Jeff Graves, male    DOB: 10-Mar-1945, 67 y.o.   MRN: [id number]  HPI 67 y/o WM here for an add-on visit... he is followed for HBP & Chol, w/ a past hx of kidney stones & cspine dz...   ~  January 19, 2010:  he states that Derm told him he had a form of Psoriasis & Rx w/ topical cream & improved... he's had a good year- no new complaints or concerns... BP controlled ;  FLP looks good;  GI stable & colon due in 2014;  he had shoulder injection from Ortho ?rotator cuff prob?[name]..   ~  January 22, 2011:  Yearly ROV & CPX> he reports a good yr, no new complaints or concerns;  BP controlled on Cozaar;  Chol looks good on Lip10; Arthritis controlled on nCelebrex which he takes prn & tolerates well;  He requests refills for 30d supplies;  OK 2012 Flu vaccine today...  Due for f/u CXR (clear, NAD), EKG (SBrady), Fasting labs (all essen wnl) today   ~  January 23, 2012:  Yearly ROV & CPX> Jeff Graves reports another good yr w/o new complaints or concerns; he does, however, c/o some intermittent dizziness after strenuous activity- no CP or palpit or syncope; this is vague & he exercises at the gym on treadmill w/o difficulty he says; he notes several friends w/ MIs & CABG- he is concerned and asking for Cardiac eval "to see what's going on"; Note: he does have marked SBrady (no BBlockers) and Cardiac eval w/ treadmill & holter is warranted...    HBP> on Losar100; BP= 118/68 w/o postural change; he denies CP, palpit, SOB, edema; but notes the intermittent dizziness w/ strenuous activity- refer to Cards...    SBrady> he has marked sinus brady & not on BBlockers, etc; notes some dizziness w/ strenuous activ but no syncope; suggest Cardiac eval w/ treadmill & holter for further eval...    Chol> on Lip20-1/2 tab daily; FLP showed TChol 161, TG 27, HDL 77, LDL 79    GI- Divertics, Hems> no GI complaints- w/o abd pain, n/v, c/d, blood seen; last colon was 2004 & f/u due soon...    DJD> on  Celebrex200 prn; known DDD Cspine w/ mild sp stenosis- treated concervatuively & doing satis;     Kidney Stones> hx several stones in the past; he passed them all, last 6-30yrs ago; no current symptoms...    Seborrheic Dermatitis> told by Glenice Bow that it was a form of Psoriasis 7 treated w/ a topical cream- improved... We reviewed prob list, meds, xrays and labs> see below for updates >> given 2013 Flu shot & TDAP today... EKG 12/13 showed SBrady, rate39, NAD, borderline tracing... LABS 12/13:  FLP- at goals on Lip10;  Chems- wnl;  CBC- wnl;  TSH=1.59;  PSA=1.22;  UA- clear   ~  January 22, 2013:  Yearly ROV & Jeff Graves reports a good year, no new complaints or concerns> he had thorough cardiac eval in [facility] & DrKlein w/ EKGs, 2DEcho, GXT, Holter (see below); found to have an atrial tachycardia & responded nicely to Flecainide w/o recurrent tachypalpitations; Cards did decr his Losartan to 50mg  due to some dizziness and all symptoms resolved... We reviewed the following medical problems during today's office visit >>     HBP> on Losar100-1/2 tab; BP= 110/60 w/o postural change; he denies CP, palpit, SOB, edema...    Cardiac arrhythmia> SBrady- Atrial Tachy to 190- PACs;  DrKlein placed him on Flecainide 100mg Bid & he is improved...    Chol> on Lip20-1/2 tab daily; FLP 12/14 showed TChol 183, TG 11, HDL 94, LDL 87...    GI- Divertics, Hems> no GI complaints- w/o abd pain, n/v, c/d, blood seen; last colon was 2004 & f/u due soon...    DJD> on Celebrex200 prn; known DDD Cspine w/ mild sp stenosis- treated conservatively & doing satis...     Kidney Stones> hx several stones in the past; he passed them all, last 7-62yrs ago; no current symptoms...    Seborrheic Dermatitis> told by Glenice Bow that it was a form of Psoriasis & treated w/ a topical cream- improved... We reviewed prob list, meds, xrays and labs> see below for updates >> he had the 2014 Flu vaccine 10/14...  CXR 12/14  showed norm heart size, clear lungs, NAD... LABS 12/14:  FLP- at goals on Lip10 & HDL=94;  Chems- wnl w/ Ct=1.4;  CBC- wnl;  TSH=2.48;  PSA=1.88...            Problem List:    HYPERTENSION (ICD-401.9) - controlled on LOSARTAN 100mg /d... ~  12/10: notes sl postural dizziness on & off so we decided to change Hyzaar to LOSARTAN 100mg /d. ~  12/11:  doing very well on the Losartan 100mg /d- BP 110/70 & similar at home;  CXR is clear, WNL;  EKG shows sinus brady, NAD... ~  12/12:  BP= 120/68 & he remains asymptomatic;  CXR remains clear;  EKG shows SBrady rate46 otherw wnl... ~  12/13: on Losar100; BP= 118/68 w/o postural change; he denies CP, palpit, SOB, edema; but notes the intermittent dizziness w/ strenuous activity- refer to Cards.  Dizziness, Tachy-Brady> he has marked sinus brady & not on BBlockers, etc; notes some dizziness w/ strenuous activ but no syncope; suggest Cardiac eval w/ treadmill & holter for further eval...  ~  EKG 12/13 showed SBrady, rate39, NAD, borderline tracing... ~  2DEcho 2/14 showed norm LV size 7 function, EF=55-60%, Gr1DD, mildly thickened leaflets, mildAI, mildMR, modPR, PAsys=33... ~  GXT 2/14 showed exercise to 11 mets, no ischemia, no angina, NSSTTWA... ~  Holter monitor 3/14 showed atrial tachy to 190, PACs, NSR & SBrady ~  4/14: Cards decided on a trial of Flecainide- 100mg Bid w/ resolution of his tachypalpit... ~  EKG 6/14 by DrKlein showed SBrady, rate49, otherw wnl...   HYPERCHOLESTEROLEMIA (ICD-272.0) - controlled on diet and LIPITOR 10mg /d... ~  FLP 11/07 showed TChol 147, TG 24, HDL 61, LDL 81... ~  FLP 12/08 showed TChol 167, TG 25, HDL 69, LDL 93... continue same Rx. ~  FLP 12.09 showed TChol 163, TG 20, HDL 66. LDL 93... rec- same. ~  FLP 12/10 showed TChol 161, TG 20, HDL 83, LDL 74 ~  FLP 12/11 on Lip10 showed TChol 155, TG 24, HDL 66, LDL 84 ~  FLP 12/12 on Lip10 showed TChol 166, TG 17, HDL 74, LDL 89 ~  FLP 12/13 on Lip10 showed TChol 161, TG  27, HDL 77, LDL 79  DIVERTICULITIS, COLON (ICD-562.11) & HEMORRHOIDS (ICD-455.6) - last colonoscopy 11/04 by [name] w/ divertics & int hems, f/u planned 62yrs per GI protocol...  DEGENERATIVE DISC DISEASE, CERVICAL SPINE (ICD-722.4) & SHOULDER PAIN (ICD-719.41) - eval 6/97 by Dagoberto Reef w/ MRI showing DDD & mild sp stenosis w/o nerve root impingement - Rx conservatively;  increased pain in 2/06 w/ eval DrElsner and MRI showing spondylosis at 3 levels, again Rx conserv...  he uses CELEBREX 200mg  Prn for pain.Marland KitchenMarland Kitchen  RENAL CALCULUS, HX OF (ICD-V13.01) - he's had 3 stones in the past and managed to pass all of them... last about 6 years ago... no prob currently.  RASH >> looked like seb dermatitis to me but Derm told him it was a form of Psoriasis & Rx by DrAJordan w/ topical cream==> improved...  Health Maint - on ASA 81mg /d... he is up-to-date on his Colonoscopy (last 2004 w/ f/u in 57yrs), PSA (12/12= 2.08), and vaccinations (flu shot today) & had pneumovax 8/03 & TDAP 12/13...   Past Surgical History  Procedure Laterality Date  . None       Outpatient Encounter Prescriptions as of 01/22/2013  Medication Sig  . aspirin EC 81 MG tablet Take 1 tablet (81 mg total) by mouth daily.  [name] atorvastatin (LIPITOR) 10 MG tablet Take 1 tablet (10 mg total) by mouth daily.  . celecoxib (CELEBREX) 200 MG capsule Take 1 capsule (200 mg total) by mouth daily as needed (for arthritis pain).  . flecainide (TAMBOCOR) 100 MG tablet TAKE 1 TABLET (100 MG TOTAL) BY MOUTH 2 (TWO) TIMES DAILY.  [name] loratadine (CLARITIN) 10 MG tablet Take 10 mg by mouth daily.  [name] losartan (COZAAR) 100 MG tablet Take 1/2 tablet by mouth once daily    No Known Allergies   Current Medications, Allergies, Past Medical History, Past Surgical History, Family History, and Social History were reviewed in [employer] record.    Review of Systems    The patient denies fever, chills, sweats, anorexia, fatigue,  weakness, malaise, weight loss, sleep disorder, blurring, diplopia, eye irritation, eye discharge, vision loss, eye pain, photophobia, earache, ear discharge, tinnitus, decreased hearing, nasal congestion, nosebleeds, sore throat, hoarseness, chest pain, palpitations, syncope, dyspnea on exertion, orthopnea, PND, peripheral edema, cough, dyspnea at rest, excessive sputum, hemoptysis, wheezing, pleurisy, nausea, vomiting, diarrhea, constipation, change in bowel habits, abdominal pain, melena, hematochezia, jaundice, gas/bloating, indigestion/heartburn, dysphagia, odynophagia, dysuria, hematuria, urinary frequency, urinary hesitancy, nocturia, incontinence, back pain, joint pain, joint swelling, muscle cramps, muscle weakness, stiffness, arthritis, sciatica, restless legs, leg pain at night, leg pain with exertion, rash, itching, dryness, suspicious lesions, paralysis, paresthesias, seizures, tremors, vertigo, transient blindness, frequent falls, frequent headaches, difficulty walking, depression, anxiety, memory loss, confusion, cold intolerance, heat intolerance, polydipsia, polyphagia, polyuria, unusual weight change, abnormal bruising, bleeding, enlarged lymph nodes, urticaria, allergic rash, hay fever, and recurrent infections.     Objective:   Physical Exam    WD, WN, 67 y/o WM in NAD... GENERAL:  Alert & oriented; pleasant & cooperative... HEENT:  [place]/AT, EOM-wnl, PERRLA, EACs-clear, TMs-wnl, NOSE-clear, THROAT-clear & wnl. NECK:  Supple w/ fairROM; no JVD; normal carotid impulses w/o bruits; no thyromegaly or nodules palpated; no lymphadenopathy. CHEST:  Clear to P & A; without wheezes/ rales/ or rhonchi. HEART:  Regular Rhythm; without murmurs/ rubs/ or gallops. ABDOMEN:  Soft & nontender; normal bowel sounds; no organomegaly or masses detected. RECTAL:  Neg - prostate 2+ & nontender w/o nodules; stool hematest neg. EXT: without deformities or arthritic changes; no varicose veins/ venous  insuffic/ or edema. NEURO:  CN's intact; motor testing normal; sensory testing normal; gait normal & balance OK. DERM:  Improved seborrheic dermatitis in scalp (dandruff), some on face, and arms...  RADIOLOGY DATA:  Reviewed in the EPIC EMR & discussed w/ the patient...  LABORATORY DATA:  Reviewed in the EPIC EMR & discussed w/ the patient...   Assessment & Plan:      HBP>  Controlled on Losartan50; continue same...  Cardiac  Arrhythmia>  he has marked sinus brady & not on BBlockers, etc; noted some dizziness w/ strenuous activ but no syncope; referred to Cards w/ full eval by DrMcAlhany & DrKlein- Holter w/ SVT & treated w/ Tambocor100Bid & improved...  CHOL>  FLP looks great on Lip10...   GI> Divertics, Hems>  Stable, asymptomatic, his follow up colon is due soon & he will call GI to set this up...  Hx Kidney Stones>  Stable, no recurrence...  DJD> CSpine, Shoulder pain>  He uses Celebrex as needed...  Other medical problems as noted...   Patient's Medications  New Prescriptions   No medications on file  Previous Medications   ASPIRIN EC 81 MG TABLET    Take 1 tablet (81 mg total) by mouth daily.   FLECAINIDE (TAMBOCOR) 100 MG TABLET    TAKE 1 TABLET (100 MG TOTAL) BY MOUTH 2 (TWO) TIMES DAILY.   LORATADINE (CLARITIN) 10 MG TABLET    Take 10 mg by mouth daily.   LOSARTAN (COZAAR) 100 MG TABLET    Take 1/2 tablet by mouth once daily  Modified Medications   Modified Medication Previous Medication   ATORVASTATIN (LIPITOR) 10 MG TABLET atorvastatin (LIPITOR) 10 MG tablet      Take 1 tablet (10 mg total) by mouth daily.    Take 1 tablet (10 mg total) by mouth daily.   CELECOXIB (CELEBREX) 200 MG CAPSULE celecoxib (CELEBREX) 200 MG capsule      Take 1 capsule (200 mg total) by mouth daily as needed (for arthritis pain).    Take 1 capsule (200 mg total) by mouth daily as needed (for arthritis pain).  Discontinued Medications   No medications on file

## 2013-01-22 NOTE — Patient Instructions (Signed)
Today we updated your med list in our EPIC system...    Continue your current medications the same...    We refilled your meds as requested...  For your constipation>>    Try to increase the fiber in your diet...    Take the OTC MIRALAX- one capful of the powder in water or juice daily...    And you may use the Senakot-S tabs 1-2 at bedtime to achieve regularity...  Today we did your follow up CXR & FASTING blood work...    We will contact you w/ the results when available...   Call for any questions...  Let's plan a follow up visit in 32yr, sooner if needed for problems.Marland KitchenMarland Kitchen

## 2013-02-12 ENCOUNTER — Encounter: Payer: Self-pay | Admitting: Internal Medicine

## 2013-02-12 ENCOUNTER — Ambulatory Visit (INDEPENDENT_AMBULATORY_CARE_PROVIDER_SITE_OTHER): Payer: BC Managed Care – PPO | Admitting: Internal Medicine

## 2013-02-12 VITALS — BP 128/80 | HR 52 | Ht 70.0 in | Wt 160.0 lb

## 2013-02-12 DIAGNOSIS — R42 Dizziness and giddiness: Secondary | ICD-10-CM

## 2013-02-12 DIAGNOSIS — I498 Other specified cardiac arrhythmias: Secondary | ICD-10-CM

## 2013-02-12 DIAGNOSIS — I471 Supraventricular tachycardia: Secondary | ICD-10-CM

## 2013-02-12 DIAGNOSIS — I10 Essential (primary) hypertension: Secondary | ICD-10-CM

## 2013-02-12 NOTE — Patient Instructions (Signed)
Your physician recommends that you continue on your current medications as directed. Please refer to the Current Medication list given to you today.  Your physician wants you to follow-up in: 6 months with Ileene Hutchinson, PAC.  You will receive a reminder letter in the mail two months in advance. If you don't receive a letter, please call our office to schedule the follow-up appointment.  Your physician wants you to follow-up in: 1 year with Dr. Caryl Comes.  You will receive a reminder letter in the mail two months in advance. If you don't receive a letter, please call our office to schedule the follow-up appointment.

## 2013-02-12 NOTE — Assessment & Plan Note (Signed)
Suspect this is related to his associated vasodilatation and relative hypotension. I've encouraged him to intentionally hydrated prior to exercising outside

## 2013-02-12 NOTE — Assessment & Plan Note (Signed)
Stable. Continue current meds.   

## 2013-02-12 NOTE — Assessment & Plan Note (Signed)
Well controlled 

## 2013-02-12 NOTE — Progress Notes (Signed)
   Patient Care Team: Noralee Space, MD as PCP - General (Pulmonary Disease)   HPI  Jeff Graves is a 68 y.o. male  Seen in followup for atrial tachycardia for which flecainide was undertaken.  He also has a history of hypertension  There is hx of dizziness associated  with  exertion Stress testing had demonstrated normal blood pressure response normal heart rate response to exercise.  Notably his exercise lightheadedness occurs most frequently outside in the summer. It does not occur  while exercising at the gym or working outside in the winter    Past Medical History  Diagnosis Date  . Hypertension   . Hypercholesterolemia   . Diverticulosis of colon   . Hemorrhoids   . Renal calculus   . DDD (degenerative disc disease)   . Shoulder pain   . Sinus bradycardia   . PAC (premature atrial contraction)   . Atrial tachycardia 06/04/2012    12/13-QRS duration-102 ms 6/14-QRS duration-98 ms     Past Surgical History  Procedure Laterality Date  . None      Current Outpatient Prescriptions  Medication Sig Dispense Refill  . aspirin EC 81 MG tablet Take 1 tablet (81 mg total) by mouth daily.      [name] atorvastatin (LIPITOR) 10 MG tablet Take 1 tablet (10 mg total) by mouth daily.  30 tablet  11  . celecoxib (CELEBREX) 200 MG capsule Take 1 capsule (200 mg total) by mouth daily as needed (for arthritis pain).  30 capsule  11  . flecainide (TAMBOCOR) 100 MG tablet TAKE 1 TABLET (100 MG TOTAL) BY MOUTH 2 (TWO) TIMES DAILY.  60 tablet  2  . loratadine (CLARITIN) 10 MG tablet Take 10 mg by mouth daily.      [name] losartan (COZAAR) 100 MG tablet Take 1/2 tablet by mouth once daily       No current facility-administered medications for this visit.    No Known Allergies  Review of Systems negative except from HPI and PMH  Physical Exam BP 128/80  Pulse 52  Ht 5\' 10"  (1.778 m)  Wt 160 lb (72.576 kg)  BMI 22.96 kg/m2 Well developed and well nourished in no acute distress HENT  normal E scleral and icterus clear Neck Supple JVP flat; carotids brisk and full Clear to ausculation  Regular rate and rhythm, no murmurs gallops or rub Soft with active bowel sounds No clubbing cyanosis none Edema Alert and oriented, grossly normal motor and sensory function Skin Warm and Dry  ecg  demonstrates sinus rhythm at 52 intervals 18/10/41 Prominent voltage O/w  normal  Assessment and  Plan

## 2013-03-10 ENCOUNTER — Other Ambulatory Visit: Payer: Self-pay | Admitting: Internal Medicine

## 2013-03-13 ENCOUNTER — Other Ambulatory Visit: Payer: Self-pay | Admitting: Pulmonary Disease

## 2013-03-19 ENCOUNTER — Ambulatory Visit (AMBULATORY_SURGERY_CENTER): Payer: Self-pay | Admitting: *Deleted

## 2013-03-19 ENCOUNTER — Encounter: Payer: Self-pay | Admitting: Gastroenterology

## 2013-03-19 VITALS — Ht 70.0 in | Wt 164.6 lb

## 2013-03-19 DIAGNOSIS — Z1211 Encounter for screening for malignant neoplasm of colon: Secondary | ICD-10-CM

## 2013-03-19 MED ORDER — MOVIPREP 100 G PO SOLR: 1.0000 | Freq: Once | ORAL | Status: DC

## 2013-03-19 NOTE — Progress Notes (Signed)
No egg or soy allergy. ewm No problems with sedation with last colon. ewm No home 02 or CPAP use. ewm Pt declined emmi video. ewm

## 2013-04-02 ENCOUNTER — Encounter: Payer: Self-pay | Admitting: Gastroenterology

## 2013-04-02 ENCOUNTER — Ambulatory Visit (AMBULATORY_SURGERY_CENTER): Payer: BC Managed Care – PPO | Admitting: Gastroenterology

## 2013-04-02 VITALS — BP 121/67 | HR 41 | Temp 96.7°F | Resp 17 | Ht 70.0 in | Wt 164.0 lb

## 2013-04-02 DIAGNOSIS — Z1211 Encounter for screening for malignant neoplasm of colon: Secondary | ICD-10-CM

## 2013-04-02 MED ORDER — SODIUM CHLORIDE 0.9 % IV SOLN: 500.0000 mL | INTRAVENOUS | Status: DC

## 2013-04-02 NOTE — Progress Notes (Signed)
Report to pacu rn, vss, bbs=clear 

## 2013-04-02 NOTE — Op Note (Signed)
[place]  [address]. [place], [address]   COLONOSCOPY PROCEDURE REPORT  PATIENT: Jeff Graves, Jeff Graves  MR#: [id number] BIRTHDATE: 1945-11-15 , 64  yrs. old GENDER: Male ENDOSCOPIST: Ladene Artist, MD, [facility] PROCEDURE DATE:  04/02/2013 PROCEDURE:   Colonoscopy, screening First Screening Colonoscopy - Avg.  risk and is 50 yrs.  old or older - No.  Prior Negative Screening - Now for repeat screening. 10 or more years since last screening  History of Adenoma - Now for follow-up colonoscopy & has been > or = to 3 yrs.  N/A  Polyps Removed Today? No.  Recommend repeat exam, <10 yrs? No. ASA CLASS:   Class II INDICATIONS:average risk screening. MEDICATIONS: MAC sedation, administered by CRNA and propofol (Diprivan) 270mg  IV DESCRIPTION OF PROCEDURE:   After the risks benefits and alternatives of the procedure were thoroughly explained, informed consent was obtained.  A digital rectal exam revealed no abnormalities of the rectum.   The LB [id number]  endoscope was introduced through the anus and advanced to the cecum, which was identified by both the appendix and ileocecal valve. No adverse events experienced.   The quality of the prep was adequate, using MoviPrep  The instrument was then slowly withdrawn as the colon was fully examined.  COLON FINDINGS: A normal appearing cecum, ileocecal valve, and appendiceal orifice were identified.  The ascending, hepatic flexure, transverse, splenic flexure, descending, sigmoid colon and rectum appeared unremarkable.  No polyps or cancers were seen. Retroflexed views revealed small internal hemorrhoids. The time to cecum=2 minutes 56 seconds.  Withdrawal time=13 minutes 35 seconds. The scope was withdrawn and the procedure completed. COMPLICATIONS: There were no complications.  ENDOSCOPIC IMPRESSION: 1.  Normal colon 2.  Small internal hemorrhoids  RECOMMENDATIONS: 1.  Continue to follow colorectal cancer  screening guidelines for "routine risk" patients with a repeat colonoscopy in 10 years. There is no need for routine screening FOBT (stool) testing for at least 5 years.  eSigned:  Ladene Artist, MD, [facility] 04/02/2013 1:56 PM

## 2013-04-02 NOTE — Patient Instructions (Signed)
Impressions/recommendations:  Hemorrhoids (handout given)  YOU HAD AN ENDOSCOPIC PROCEDURE TODAY AT THE [facility]: Refer to the procedure report that was given to you for any specific questions about what was found during the examination.  If the procedure report does not answer your questions, please call your gastroenterologist to clarify.  If you requested that your care partner not be given the details of your procedure findings, then the procedure report has been included in a sealed envelope for you to review at your convenience later.  YOU SHOULD EXPECT: Some feelings of bloating in the abdomen. Passage of more gas than usual.  Walking can help get rid of the air that was put into your GI tract during the procedure and reduce the bloating. If you had a lower endoscopy (such as a colonoscopy or flexible sigmoidoscopy) you may notice spotting of blood in your stool or on the toilet paper. If you underwent a bowel prep for your procedure, then you may not have a normal bowel movement for a few days.  DIET: Your first meal following the procedure should be a light meal and then it is ok to progress to your normal diet.  A half-sandwich or bowl of soup is an example of a good first meal.  Heavy or fried foods are harder to digest and may make you feel nauseous or bloated.  Likewise meals heavy in dairy and vegetables can cause extra gas to form and this can also increase the bloating.  Drink plenty of fluids but you should avoid alcoholic beverages for 24 hours.  ACTIVITY: Your care partner should take you home directly after the procedure.  You should plan to take it easy, moving slowly for the rest of the day.  You can resume normal activity the day after the procedure however you should NOT DRIVE or use heavy machinery for 24 hours (because of the sedation medicines used during the test).    SYMPTOMS TO REPORT IMMEDIATELY: A gastroenterologist can be reached at any hour.  During  normal business hours, 8:30 AM to 5:00 PM Monday through Friday, call [phone].  After hours and on weekends, please call the GI answering service at [phone] who will take a message and have the physician on call contact you.   Following lower endoscopy (colonoscopy or flexible sigmoidoscopy):  Excessive amounts of blood in the stool  Significant tenderness or worsening of abdominal pains  Swelling of the abdomen that is new, acute  Fever of 100F or higher   FOLLOW UP: If any biopsies were taken you will be contacted by phone or by letter within the next 1-3 weeks.  Call your gastroenterologist if you have not heard about the biopsies in 3 weeks.  Our staff will call the home number listed on your records the next business day following your procedure to check on you and address any questions or concerns that you may have at that time regarding the information given to you following your procedure. This is a courtesy call and so if there is no answer at the home number and we have not heard from you through the emergency physician on call, we will assume that you have returned to your regular daily activities without incident.  SIGNATURES/CONFIDENTIALITY: You and/or your care partner have signed paperwork which will be entered into your electronic medical record.  These signatures attest to the fact that that the information above on your After Visit Summary has been reviewed and is understood.    Full responsibility of the confidentiality of this discharge information lies with you and/or your care-partner.

## 2013-04-05 ENCOUNTER — Telehealth: Payer: Self-pay | Admitting: *Deleted

## 2013-04-05 NOTE — Telephone Encounter (Signed)
  Follow up Call-  Call back number 04/02/2013  Post procedure Call Back phone  # [phone]  Permission to leave phone message Yes     Patient questions:  Do you have a fever, pain , or abdominal swelling? no Pain Score  0 *  Have you tolerated food without any problems? yes  Have you been able to return to your normal activities? yes  Do you have any questions about your discharge instructions: Diet   no Medications  no Follow up visit  no  Do you have questions or concerns about your Care? no  Actions: * If pain score is 4 or above: No action needed, pain <4.

## 2013-09-11 ENCOUNTER — Other Ambulatory Visit: Payer: Self-pay | Admitting: Internal Medicine

## 2013-10-14 ENCOUNTER — Ambulatory Visit (INDEPENDENT_AMBULATORY_CARE_PROVIDER_SITE_OTHER): Payer: BC Managed Care – PPO | Admitting: Internal Medicine

## 2013-10-14 VITALS — BP 136/80 | HR 52 | Ht 70.0 in | Wt 159.0 lb

## 2013-10-14 DIAGNOSIS — I471 Supraventricular tachycardia: Secondary | ICD-10-CM

## 2013-10-14 DIAGNOSIS — I498 Other specified cardiac arrhythmias: Secondary | ICD-10-CM

## 2013-10-14 NOTE — Patient Instructions (Signed)
Your physician recommends that you continue on your current medications as directed. Please refer to the Current Medication list given to you today.  Your physician wants you to follow-up in: 1 year with Dr. Klein.  You will receive a reminder letter in the mail two months in advance. If you don't receive a letter, please call our office to schedule the follow-up appointment.  

## 2013-10-14 NOTE — Progress Notes (Signed)
   Patient Care Team: Noralee Space, MD as PCP - General (Pulmonary Disease)   HPI  Jeff Graves is a 68 y.o. male  Seen in followup for atrial tachycardia for which he takes flecainide   He also has a history of hypertension  There is hx of dizziness associated  with  exertion Stress testing had demonstrated normal blood pressure response normal heart rate response to exercise.  Notably his exercise lightheadedness occurs most frequently outside in the summer. It does not occur  while exercising at the gym or working outside in the winter  The summer he is also noted episodes of short lasting chest discomfort that occurred in the summer. It was unassociated with exertion. It lasted about 1 minute. It did not radiate had no accompanying symptoms. He describes it as being present along his left breast bone underneath his left breast area  He has noted no impairment of exercise tolerance    Past Medical History  Diagnosis Date  . Hypertension   . Hypercholesterolemia   . Diverticulosis of colon   . Hemorrhoids   . Renal calculus   . DDD (degenerative disc disease)   . Shoulder pain   . Sinus bradycardia   . PAC (premature atrial contraction)   . Atrial tachycardia 06/04/2012    12/13-QRS duration-102 ms 6/14-QRS duration-98 ms   . Allergy     seasonal    Past Surgical History  Procedure Laterality Date  . None    . Colonoscopy      Current Outpatient Prescriptions  Medication Sig Dispense Refill  . aspirin EC 81 MG tablet Take 1 tablet (81 mg total) by mouth daily.      [name] atorvastatin (LIPITOR) 10 MG tablet Take 1 tablet (10 mg total) by mouth daily.  30 tablet  11  . celecoxib (CELEBREX) 200 MG capsule Take 1 capsule (200 mg total) by mouth daily as needed (for arthritis pain).  30 capsule  11  . flecainide (TAMBOCOR) 100 MG tablet TAKE 1 TABLET (100 MG TOTAL) BY MOUTH 2 (TWO) TIMES DAILY.  60 tablet  4  . loratadine (CLARITIN) 10 MG tablet Take 10 mg by mouth  daily.       [name] losartan (COZAAR) 100 MG tablet TAKE 1/2 TABLET (100 MG TOTAL) BY MOUTH DAILY.       No current facility-administered medications for this visit.    No Known Allergies  Review of Systems negative except from HPI and PMH  Physical Exam BP 136/80  Pulse 52  Ht 5\' 10"  (1.778 m)  Wt 159 lb (72.122 kg)  BMI 22.81 kg/m2 Well developed and well nourished in no acute distress HENT normal E scleral and icterus clear Neck Supple JVP flat; carotids brisk and full Clear to ausculation  Regular rate and rhythm, soft S1  no murmurs gallops or rub Soft with active bowel sounds No clubbing cyanosis none Edema Alert and oriented, grossly normal motor and sensory function Skin Warm and Dry  ecg  demonstrates sinus rhythm at 52 intervals 21/11/41 Prominent voltage O/w  normal  Assessment and  Plan  Chest pain atypical  Atrial tachycardia-flecainide  Hypertension  Patient's arrhythmia is quiescent by symptoms. His flecainide dose is reasonable.  His blood pressure is well-controlled.  Chest pain is very atypical. He is advised to let us know if there is more. He had a negative standard treadmill 2/14

## 2014-01-24 ENCOUNTER — Ambulatory Visit: Payer: BC Managed Care – PPO | Admitting: Pulmonary Disease

## 2014-03-16 ENCOUNTER — Other Ambulatory Visit: Payer: Self-pay | Admitting: Pulmonary Disease

## 2014-03-29 HISTORY — PX: SHOULDER SURGERY: SHX246

## 2014-04-13 ENCOUNTER — Other Ambulatory Visit: Payer: Self-pay | Admitting: Internal Medicine

## 2014-04-15 ENCOUNTER — Ambulatory Visit (INDEPENDENT_AMBULATORY_CARE_PROVIDER_SITE_OTHER): Payer: BLUE CROSS/BLUE SHIELD | Admitting: Pulmonary Disease

## 2014-04-15 ENCOUNTER — Encounter: Payer: Self-pay | Admitting: Pulmonary Disease

## 2014-04-15 ENCOUNTER — Other Ambulatory Visit (INDEPENDENT_AMBULATORY_CARE_PROVIDER_SITE_OTHER): Payer: BLUE CROSS/BLUE SHIELD

## 2014-04-15 VITALS — BP 130/82 | HR 60 | Temp 97.7°F | Ht 70.0 in | Wt 159.4 lb

## 2014-04-15 DIAGNOSIS — I10 Essential (primary) hypertension: Secondary | ICD-10-CM

## 2014-04-15 DIAGNOSIS — E7800 Pure hypercholesterolemia, unspecified: Secondary | ICD-10-CM

## 2014-04-15 DIAGNOSIS — F419 Anxiety disorder, unspecified: Secondary | ICD-10-CM

## 2014-04-15 DIAGNOSIS — Z87442 Personal history of urinary calculi: Secondary | ICD-10-CM

## 2014-04-15 DIAGNOSIS — M5010 Cervical disc disorder with radiculopathy, unspecified cervical region: Secondary | ICD-10-CM

## 2014-04-15 DIAGNOSIS — K5730 Diverticulosis of large intestine without perforation or abscess without bleeding: Secondary | ICD-10-CM

## 2014-04-15 DIAGNOSIS — Z23 Encounter for immunization: Secondary | ICD-10-CM

## 2014-04-15 DIAGNOSIS — Z9889 Other specified postprocedural states: Secondary | ICD-10-CM | POA: Insufficient documentation

## 2014-04-15 DIAGNOSIS — N320 Bladder-neck obstruction: Secondary | ICD-10-CM

## 2014-04-15 DIAGNOSIS — I4719 Other supraventricular tachycardia: Secondary | ICD-10-CM

## 2014-04-15 DIAGNOSIS — I471 Supraventricular tachycardia: Secondary | ICD-10-CM

## 2014-04-15 HISTORY — DX: Other specified postprocedural states: Z98.890

## 2014-04-15 LAB — BASIC METABOLIC PANEL
BUN: 19 mg/dL (ref 6–23)
CHLORIDE: 102 meq/L (ref 96–112)
CO2: 31 meq/L (ref 19–32)
Calcium: 9.5 mg/dL (ref 8.4–10.5)
Creatinine, Ser: 1.32 mg/dL (ref 0.40–1.50)
GFR: 57.48 mL/min — ABNORMAL LOW (ref >60.00)
Glucose, Bld: 88 mg/dL (ref 70–99)
Potassium: 4.7 mEq/L (ref 3.5–5.1)
Sodium: 136 mEq/L (ref 135–145)

## 2014-04-15 LAB — CBC WITH DIFFERENTIAL/PLATELET
BASOS ABS: 0 10*3/uL (ref 0.0–0.1)
Basophils Relative: 0.7 % (ref 0.0–3.0)
Eosinophils Absolute: 0.2 10*3/uL (ref 0.0–0.7)
Eosinophils Relative: 2.9 % (ref 0.0–5.0)
HEMATOCRIT: 41 % (ref 39.0–52.0)
Hemoglobin: 13.7 g/dL (ref 13.0–17.0)
LYMPHS ABS: 1.2 10*3/uL (ref 0.7–4.0)
Lymphocytes Relative: 22.1 % (ref 12.0–46.0)
MCHC: 33.4 g/dL (ref 30.0–36.0)
MCV: 93.5 fl (ref 78.0–100.0)
Monocytes Absolute: 0.6 10*3/uL (ref 0.1–1.0)
Monocytes Relative: 11.2 % (ref 3.0–12.0)
Neutro Abs: 3.5 10*3/uL (ref 1.4–7.7)
Neutrophils Relative %: 63.1 % (ref 43.0–77.0)
Platelets: 383 10*3/uL (ref 150.0–400.0)
RBC: 4.39 Mil/uL (ref 4.22–5.81)
RDW: 13.8 % (ref 11.5–15.5)
WBC: 5.5 10*3/uL (ref 4.0–10.5)

## 2014-04-15 LAB — PSA: PSA: 1.89 ng/mL (ref 0.10–4.00)

## 2014-04-15 LAB — HEPATIC FUNCTION PANEL
ALK PHOS: 78 U/L (ref 39–117)
ALT: 17 U/L (ref 0–53)
AST: 14 U/L (ref 0–37)
Albumin: 4.1 g/dL (ref 3.5–5.2)
BILIRUBIN TOTAL: 0.6 mg/dL (ref 0.2–1.2)
Bilirubin, Direct: 0.2 mg/dL (ref 0.0–0.3)
Total Protein: 6.7 g/dL (ref 6.0–8.3)

## 2014-04-15 LAB — LIPID PANEL
CHOL/HDL RATIO: 2
Cholesterol: 165 mg/dL (ref 0–200)
HDL: 75.7 mg/dL (ref >39.00)
LDL CALC: 81 mg/dL (ref 0–99)
NONHDL: 89.3
Triglycerides: 43 mg/dL (ref 0.0–149.0)
VLDL: 8.6 mg/dL (ref 0.0–40.0)

## 2014-04-15 LAB — TSH: TSH: 1.55 u[IU]/mL (ref 0.35–4.50)

## 2014-04-15 MED ORDER — FLECAINIDE ACETATE 100 MG PO TABS: ORAL_TABLET | ORAL | Status: DC

## 2014-04-15 MED ORDER — CELECOXIB 200 MG PO CAPS: 200.0000 mg | ORAL_CAPSULE | Freq: Every day | ORAL | Status: DC | PRN

## 2014-04-15 MED ORDER — ATORVASTATIN CALCIUM 10 MG PO TABS: ORAL_TABLET | ORAL | Status: DC

## 2014-04-15 NOTE — Patient Instructions (Signed)
Today we updated your med list in our EPIC system...    Continue your current medications the same...    We refilled your meds per request...  Today we did your follow up FASTING blood work...    We will contact you w/ the results when available...   We gave you the pneumonia vaccine called PREVNAR-13 today...    You will need the other pneumonia shot next yr...  Call for any questions or if we can be of service in any way...  Let's plan a follow up visit in 74yr.Marland KitchenMarland Kitchen

## 2014-04-15 NOTE — Progress Notes (Addendum)
Subjective:    Patient ID: Jeff Graves, male    DOB: June 08, 1945, 69 y.o.   MRN: [id number]  HPI 69 y/o WM here for an add-on visit... he is followed for HBP & Chol, w/ a past hx of kidney stones & cspine dz...  ~  SEE PREV EPIC NOTES FOR OLDER DATA >>   ~  January 23, 2012:  Yearly ROV & CPX> Jeff Graves reports another good yr w/o new complaints or concerns; he does, however, c/o some intermittent dizziness after strenuous activity- no CP or palpit or syncope; this is vague & he exercises at the gym on treadmill w/o difficulty he says; he notes several friends w/ MIs & CABG- he is concerned and asking for Cardiac eval "to see what's going on"; Note: he does have marked SBrady (no BBlockers) and Cardiac eval w/ treadmill & holter is warranted...    HBP> on Losar100; BP= 118/68 w/o postural change; he denies CP, palpit, SOB, edema; but notes the intermittent dizziness w/ strenuous activity- refer to Cards...    SBrady> he has marked sinus brady & not on BBlockers, etc; notes some dizziness w/ strenuous activ but no syncope; suggest Cardiac eval w/ treadmill & holter for further eval...    Chol> on Lip20-1/2 tab daily; FLP showed TChol 161, TG 27, HDL 77, LDL 79    GI- Divertics, Hems> no GI complaints- w/o abd pain, n/v, c/d, blood seen; last colon was 2004 & f/u due soon...    DJD> on Celebrex200 prn; known DDD Cspine w/ mild sp stenosis- treated concervatuively & doing satis;     Kidney Stones> hx several stones in the past; he passed them all, last 6-5yrs ago; no current symptoms...    Seborrheic Dermatitis> told by Nanda Quinton that it was a form of Psoriasis 7 treated w/ a topical cream- improved... We reviewed prob list, meds, xrays and labs> see below for updates >> given 2013 Flu shot & TDAP today...  EKG 12/13 showed SBrady, rate39, NAD, borderline tracing...  LABS 12/13:  FLP- at goals on Lip10;  Chems- wnl;  CBC- wnl;  TSH=1.59;  PSA=1.22;  UA- clear   ~  January 22, 2013:  Yearly  Jeff Graves reports a good year, no new complaints or concerns> he had thorough cardiac eval in 2014 by [place] w/ EKGs, 2DEcho, GXT, Holter (see below); found to have an atrial tachycardia & responded nicely to Flecainide w/o recurrent tachypalpitations; Cards did decr his Losartan to 50mg  due to some dizziness and all symptoms resolved... We reviewed the following medical problems during today's office visit >>     HBP> on Losar100-1/2 tab; BP= 110/60 w/o postural change; he denies CP, palpit, SOB, edema...    Cardiac arrhythmia> SBrady- Atrial Tachy to 190- PACs; DrKlein placed him on Flecainide 100mg Bid & he is improved...    Chol> on Lip20-1/2 tab daily; FLP 12/14 showed TChol 183, TG 11, HDL 94, LDL 87...    GI- Divertics, Hems> no GI complaints- w/o abd pain, n/v, c/d, blood seen; last colon was 2004 & f/u due soon...    DJD> on Celebrex200 prn; known DDD Cspine w/ mild sp stenosis- treated conservatively & doing satis...     Kidney Stones> hx several stones in the past; he passed them all, last 7-26yrs ago; no current symptoms...    Seborrheic Dermatitis> told by Nanda Quinton that it was a form of Psoriasis & treated w/ a topical cream- improved... We reviewed prob list, meds,  xrays and labs> see below for updates >> he had the 2014 Flu vaccine 10/14...   CXR 12/14 showed norm heart size, clear lungs, NAD...  LABS 12/14:  FLP- at goals on Lip10 & HDL=94;  Chems- wnl w/ Ct=1.4;  CBC- wnl;  TSH=2.48;  PSA=1.88...   ~  April 15, 2014:  40mo ROV & Jeff Graves reports another good year overall- he recently had left rotator cuff surg (2/16 by [name]); he is now getting rehab & is improved... We reviewed the following medical problems during today's office visit >>     HBP> on Losar100-1/2 tab; BP= 130/82 w/o postural change; he denies CP, palpit, SOB, edema...    Cardiac arrhythmia> SBrady- Atrial Tachy to 190- PACs; DrKlein placed him on Flecainide 100mg Bid & he is improved...     Chol> on Lip10 daily; FLP 3/16 showed TChol 165, TG 43, HDL 76, LDL 81...    GI- Divertics, Hems> no GI complaints- w/o abd pain, n/v, c/d, blood seen; he had f/u colonoscopy 2/15 by [name]- NEG x sm int hems; he rec repeat colon in 59yrs...    DJD> on Celebrex200 prn; known DDD Cspine w/ mild sp stenosis- treated conservatively & doing satis; he fell w/ left shoulder injury, eval by Ortho & he had rotator cuff surg 2/16- now in rehab & improved...Marland KitchenMarland KitchenMarland Kitchen     Kidney Stones> hx several stones in the past; he passed them all, last 8-20yrs ago; no current symptoms...    Seborrheic Dermatitis> told by Nanda Quinton that it was a form of Psoriasis & treated w/ a topical cream- improved... We reviewed prob list, meds, xrays and labs> see below for updates >> he had 2015 flu vaccine last Nov; given PREVNAR-13 today...  LABS 3/16:  FLP- wnl on Lip10;  Chems- wnl;  CBC- wnl;  TSH=1.55;  PSA=1.89...           Problem List:    HYPERTENSION (ICD-401.9) - controlled on LOSARTAN Rx... ~  12/10: notes sl postural dizziness on & off so we decided to change Hyzaar to LOSARTAN 100mg /d. ~  12/11:  doing very well on the Losartan 100mg /d- BP 110/70 & similar at home;  CXR is clear, WNL;  EKG shows sinus brady, NAD... ~  12/12:  BP= 120/68 & he remains asymptomatic;  CXR remains clear;  EKG shows SBrady rate46 otherw wnl... ~  12/13: on Losar100; BP= 118/68 w/o postural change; he denies CP, palpit, SOB, edema; but notes the intermittent dizziness w/ strenuous activity- refer to Cards. ~  12/14: on Losar100-1/2 tab; BP= 110/60 w/o postural change; he denies CP, palpit, SOB, edema...  Dizziness, Tachy-Brady> he has marked sinus brady & not on BBlockers, etc; notes some dizziness w/ strenuous activ but no syncope; suggest Cardiac eval w/ treadmill & holter for further eval...  ~  EKG 12/13 showed SBrady, rate39, NAD, borderline tracing... ~  2DEcho 2/14 showed norm LV size 7 function, EF=55-60%, Gr1DD, mildly thickened  leaflets, mildAI, mildMR, modPR, PAsys=33... ~  GXT 2/14 showed exercise to 11 mets, no ischemia, no angina, NSSTTWA... ~  Holter monitor 3/14 showed atrial tachy to 190, PACs, NSR & SBrady ~  4/14: Cards decided on a trial of Flecainide- 100mg Bid w/ resolution of his tachypalpit... ~  EKG 6/14 by DrKlein showed SBrady, rate49, otherw wnl...  ~  12/14: SBrady- Atrial Tachy to 190- PACs; DrKlein placed him on Flecainide 100mg Bid & he is improved ~  3/16:  He remains stable on Flecainide100Bid; last saw DrKlein 9/15- noted  some atypCP but felt to be stable...  HYPERCHOLESTEROLEMIA (ICD-272.0) - controlled on diet and LIPITOR 10mg /d... ~  Jeff Graves 11/07 showed TChol 147, TG 24, HDL 61, LDL 81... ~  Jeff Graves 12/08 showed TChol 167, TG 25, HDL 69, LDL 93... continue same Rx. ~  FLP 12.09 showed TChol 163, TG 20, HDL 66. LDL 93... rec- same. ~  FLP 12/10 showed TChol 161, TG 20, HDL 83, LDL 74 ~  FLP 12/11 on Lip10 showed TChol 155, TG 24, HDL 66, LDL 84 ~  FLP 12/12 on Lip10 showed TChol 166, TG 17, HDL 74, LDL 89 ~  FLP 12/13 on Lip10 showed TChol 161, TG 27, HDL 77, LDL 79 ~  FLP 12/14 on Lip10 showed TChol 183, TG 11, HDL 94, LDL 87 ~  FLP 3/16 on Lip10 showed TChol 165, TG 43, HDL 76, LDL 81  DIVERTICULITIS, COLON (ICD-562.11) & HEMORRHOIDS (ICD-455.6) >>  ~  last colonoscopy 11/04 by [name] w/ divertics & int hems, f/u planned 11yrs per GI protocol... ~  he had f/u colonoscopy 2/15 by [name]- NEG x sm int hems; he rec repeat colon in 46yrs...  DEGENERATIVE DISC DISEASE, CERVICAL SPINE (ICD-722.4) & SHOULDER PAIN (ICD-719.41) - eval 6/97 by Marciano Sequin w/ MRI showing DDD & mild sp stenosis w/o nerve root impingement - Rx conservatively;  increased pain in 2/06 w/ eval DrElsner and MRI showing spondylosis at 3 levels, again Rx conserv...  he uses CELEBREX 200mg  Prn for pain... ~  2/16: he fell w/ left shoulder pain & eval by [name]- he had left rotator cuff surg 2/16, followed by rehab,  improved...  RENAL CALCULUS, HX OF (ICD-V13.01) - he's had 3 stones in the past and managed to pass all of them... last about 6 years ago... no prob currently.  RASH >> looked like seb dermatitis to me but Derm told him it was a form of Psoriasis & Rx by DrAJordan w/ topical cream==> improved...  Health Maint - on ASA 81mg /d...  ~  GI:  Followed by [name] & he is up to date w/ colon done 2/15- neg... ~  GU:  His DRE is neg/ wnl & PSAs have been wnl as well... ~  Immuniz:  He gets the yearly flu vaccines;  He had TDAP 12/13;  He had remote Pneumovax 2003;  Given PREVNAR-13 3/16...   Past Surgical History  Procedure Laterality Date  . None    . Colonoscopy    . Shoulder surgery Left 03/29/2014     Outpatient Encounter Prescriptions as of 04/15/2014  Medication Sig  . aspirin EC 81 MG tablet Take 1 tablet (81 mg total) by mouth daily.  [name] atorvastatin (LIPITOR) 10 MG tablet TAKE 1 TABLET (10 MG TOTAL) BY MOUTH DAILY.  . celecoxib (CELEBREX) 200 MG capsule Take 1 capsule (200 mg total) by mouth daily as needed (for arthritis pain).  . flecainide (TAMBOCOR) 100 MG tablet TAKE 1 TABLET (100 MG TOTAL) BY MOUTH 2 (TWO) TIMES DAILY.  [name] loratadine (CLARITIN) 10 MG tablet Take 10 mg by mouth daily.   [name] losartan (COZAAR) 100 MG tablet TAKE 1/2 TABLET (100 MG TOTAL) BY MOUTH DAILY.    No Known Allergies   Current Medications, Allergies, Past Medical History, Past Surgical History, Family History, and Social History were reviewed in [employer] record.    Review of Systems    The patient denies fever, chills, sweats, anorexia, fatigue, weakness, malaise, weight loss, sleep disorder, blurring, diplopia, eye irritation, eye discharge,  vision loss, eye pain, photophobia, earache, ear discharge, tinnitus, decreased hearing, nasal congestion, nosebleeds, sore throat, hoarseness, chest pain, palpitations, syncope, dyspnea on exertion, orthopnea, PND, peripheral edema, cough,  dyspnea at rest, excessive sputum, hemoptysis, wheezing, pleurisy, nausea, vomiting, diarrhea, constipation, change in bowel habits, abdominal pain, melena, hematochezia, jaundice, gas/bloating, indigestion/heartburn, dysphagia, odynophagia, dysuria, hematuria, urinary frequency, urinary hesitancy, nocturia, incontinence, back pain, joint pain, joint swelling, muscle cramps, muscle weakness, stiffness, arthritis, sciatica, restless legs, leg pain at night, leg pain with exertion, rash, itching, dryness, suspicious lesions, paralysis, paresthesias, seizures, tremors, vertigo, transient blindness, frequent falls, frequent headaches, difficulty walking, depression, anxiety, memory loss, confusion, cold intolerance, heat intolerance, polydipsia, polyphagia, polyuria, unusual weight change, abnormal bruising, bleeding, enlarged lymph nodes, urticaria, allergic rash, hay fever, and recurrent infections.     Objective:   Physical Exam    WD, WN, 69 y/o WM in NAD... GENERAL:  Alert & oriented; pleasant & cooperative... HEENT:  [place]/AT, EOM-wnl, PERRLA, EACs-clear, TMs-wnl, NOSE-clear, THROAT-clear & wnl. NECK:  Supple w/ fairROM; no JVD; normal carotid impulses w/o bruits; no thyromegaly or nodules palpated; no lymphadenopathy. CHEST:  Clear to P & A; without wheezes/ rales/ or rhonchi. HEART:  Regular Rhythm; without murmurs/ rubs/ or gallops. ABDOMEN:  Soft & nontender; normal bowel sounds; no organomegaly or masses detected. RECTAL:  Neg - prostate 2+ & nontender w/o nodules; stool hematest neg. EXT: without deformities or arthritic changes; no varicose veins/ venous insuffic/ or edema. NEURO:  CN's intact; motor testing normal; sensory testing normal; gait normal & balance OK. DERM:  Improved seborrheic dermatitis in scalp (dandruff), some on face, and arms...  RADIOLOGY DATA:  Reviewed in the EPIC EMR & discussed w/ the patient...  LABORATORY DATA:  Reviewed in the EPIC EMR & discussed w/ the  patient...   Assessment & Plan:      HBP>  Controlled on Losartan50; continue same...  Cardiac Arrhythmia>  he has marked sinus brady & not on BBlockers, etc; noted some dizziness w/ strenuous activ but no syncope; referred to Cards w/ full eval by Jeff Graves & DrKlein- Holter w/ SVT & treated w/ Tambocor100Bid & improved...  CHOL>  FLP looks great on Lip10...   GI> Divertics, Hems>  Stable, asymptomatic, his follow up colon is due soon & he will call GI to set this up...  Hx Kidney Stones>  Stable, no recurrence...  DJD> CSpine, Shoulder pain>  He uses Celebrex as needed...  Other medical problems as noted...   Patient's Medications  New Prescriptions   No medications on file  Previous Medications   ASPIRIN EC 81 MG TABLET    Take 1 tablet (81 mg total) by mouth daily.   LORATADINE (CLARITIN) 10 MG TABLET    Take 10 mg by mouth daily.    LOSARTAN (COZAAR) 100 MG TABLET    TAKE 1/2 TABLET (100 MG TOTAL) BY MOUTH DAILY.  Modified Medications   Modified Medication Previous Medication   ATORVASTATIN (LIPITOR) 10 MG TABLET atorvastatin (LIPITOR) 10 MG tablet      TAKE 1 TABLET (10 MG TOTAL) BY MOUTH DAILY.    TAKE 1 TABLET (10 MG TOTAL) BY MOUTH DAILY.   CELECOXIB (CELEBREX) 200 MG CAPSULE celecoxib (CELEBREX) 200 MG capsule      Take 1 capsule (200 mg total) by mouth daily as needed (for arthritis pain).    Take 1 capsule (200 mg total) by mouth daily as needed (for arthritis pain).   FLECAINIDE (TAMBOCOR) 100 MG TABLET flecainide (TAMBOCOR) 100 MG  tablet      TAKE 1 TABLET (100 MG TOTAL) BY MOUTH 2 (TWO) TIMES DAILY.    TAKE 1 TABLET (100 MG TOTAL) BY MOUTH 2 (TWO) TIMES DAILY.  Discontinued Medications   No medications on file

## 2014-05-13 ENCOUNTER — Other Ambulatory Visit: Payer: Self-pay | Admitting: Pulmonary Disease

## 2014-12-07 ENCOUNTER — Encounter: Payer: Self-pay | Admitting: Internal Medicine

## 2014-12-07 ENCOUNTER — Ambulatory Visit (INDEPENDENT_AMBULATORY_CARE_PROVIDER_SITE_OTHER): Payer: BLUE CROSS/BLUE SHIELD | Admitting: Internal Medicine

## 2014-12-07 VITALS — BP 140/90 | HR 47 | Ht 69.0 in | Wt 159.2 lb

## 2014-12-07 DIAGNOSIS — R5383 Other fatigue: Secondary | ICD-10-CM | POA: Diagnosis not present

## 2014-12-07 DIAGNOSIS — I471 Supraventricular tachycardia: Secondary | ICD-10-CM

## 2014-12-07 DIAGNOSIS — I4589 Other specified conduction disorders: Secondary | ICD-10-CM

## 2014-12-07 NOTE — Patient Instructions (Signed)
Medication Instructions: - no changes  Labwork: - none  Procedures/Testing: - Your physician has requested that you have an exercise stress myoview. For further information please visit [email or web address]. Please follow instruction sheet, as given.  Follow-Up: - Your physician wants you to follow-up in: 1 year with Dr. Caryl Comes. You will receive a reminder letter in the mail two months in advance. If you don't receive a letter, please call our office to schedule the follow-up appointment.  Any Additional Special Instructions Will Be Listed Below (If Applicable).

## 2014-12-07 NOTE — Progress Notes (Signed)
Patient Care Team: Noralee Space, MD as PCP - General (Pulmonary Disease)   HPI  Jeff Graves is a 69 y.o. male  Seen in followup for atrial tachycardia for which he takes flecainide   He also has a history of hypertension  There is hx of dizziness associated  with  exertion Stress testing had demonstrated normal blood pressure response normal heart rate response to exercise.  Notably his exercise lightheadedness occurs most frequently outside in the summer. It does not occur  while exercising at the gym or working outside in the winter  The summer he is also noted episodes of short lasting chest discomfort that occurred in the summer. It was unassociated with exertion. It lasted about 1 minute. It did not radiate had no accompanying symptoms. He describes it as being present along his left breast bone underneath his left breast area  He has noted no impairment of exercise tolerance    Past Medical History  Diagnosis Date  . Hypertension   . Hypercholesterolemia   . Diverticulosis of colon   . Hemorrhoids   . Renal calculus   . DDD (degenerative disc disease)   . Shoulder pain   . Sinus bradycardia   . PAC (premature atrial contraction)   . Atrial tachycardia 06/04/2012    12/13-QRS duration-102 ms 6/14-QRS duration-98 ms   . Allergy     seasonal    Past Surgical History  Procedure Laterality Date  . None    . Colonoscopy    . Shoulder surgery Left 03/29/2014    Current Outpatient Prescriptions  Medication Sig Dispense Refill  . aspirin EC 81 MG tablet Take 1 tablet (81 mg total) by mouth daily.    [name] atorvastatin (LIPITOR) 10 MG tablet TAKE 1 TABLET (10 MG TOTAL) BY MOUTH DAILY. 30 tablet 6  . celecoxib (CELEBREX) 200 MG capsule Take 1 capsule (200 mg total) by mouth daily as needed (for arthritis pain). 30 capsule 11  . flecainide (TAMBOCOR) 100 MG tablet TAKE 1 TABLET (100 MG TOTAL) BY MOUTH 2 (TWO) TIMES DAILY. 60 tablet 6  . loratadine (CLARITIN) 10 MG  tablet Take 10 mg by mouth daily.     [name] losartan (COZAAR) 100 MG tablet TAKE 1/2 TABLET (100 MG TOTAL) BY MOUTH DAILY.    [name] losartan (COZAAR) 100 MG tablet TAKE 1 TABLET (100 MG TOTAL) BY MOUTH DAILY. 30 tablet 9   No current facility-administered medications for this visit.    No Known Allergies  Review of Systems negative except from HPI and PMH  Physical Exam There were no vitals taken for this visit. Well developed and well nourished in no acute distress HENT normal E scleral and icterus clear Neck Supple JVP flat; carotids brisk and full Clear to ausculation  Regular rate and rhythm, soft S1  no murmurs gallops or rub Soft with active bowel sounds No clubbing cyanosis none Edema Alert and oriented, grossly normal motor and sensory function Skin Warm and Dry  ecg  demonstrates sinus rhythm at 47 Intervals 19/10/44 O/w  normal  Assessment and  Plan  Chest pain atypical  Exercise intolerance  Atrial tachycardia-flecainide  Lightheadedness  Sinus bradycardia  Hypertension  He has had no symptomatically palpitations.  He has noted increasing exercise intolerance which may be related to the progressive sinus bradycardia noted by ECG. I wonder whether he is chronotropically incompetent. We'll undertake treadmill testing. Given the fact that he is on flecainide will undertake Myoview scanning The event that  he is unable to accomplish an appropriate heart rate, we will use Lexiscan.  His chest pain remains quite atypical given its prolonged duration.  His episodes of lightheadedness are also peculiar. A relatively brief period they're mostly associated with standing suggesting the possibility of a blood pressure mechanism.

## 2014-12-08 ENCOUNTER — Telehealth (HOSPITAL_COMMUNITY): Payer: Self-pay | Admitting: *Deleted

## 2014-12-08 NOTE — Telephone Encounter (Signed)
Patient given detailed instructions per Myocardial Perfusion Study Information Sheet for the test on 12/13/14 at 7:30. Patient notified to arrive 15 minutes early and that it is imperative to arrive on time for appointment to keep from having the test rescheduled.  If you need to cancel or reschedule your appointment, please call the office within 24 hours of your appointment. Failure to do so may result in a cancellation of your appointment, and a $50 no show fee. Patient verbalized understanding.Jeff Graves

## 2014-12-09 ENCOUNTER — Encounter: Payer: Self-pay | Admitting: Cardiology

## 2014-12-13 ENCOUNTER — Ambulatory Visit (HOSPITAL_COMMUNITY): Payer: Medicare Other | Attending: Cardiovascular Disease

## 2014-12-13 DIAGNOSIS — R42 Dizziness and giddiness: Secondary | ICD-10-CM | POA: Insufficient documentation

## 2014-12-13 DIAGNOSIS — I471 Supraventricular tachycardia: Secondary | ICD-10-CM | POA: Insufficient documentation

## 2014-12-13 DIAGNOSIS — R5383 Other fatigue: Secondary | ICD-10-CM | POA: Insufficient documentation

## 2014-12-13 DIAGNOSIS — R9439 Abnormal result of other cardiovascular function study: Secondary | ICD-10-CM | POA: Diagnosis not present

## 2014-12-13 LAB — MYOCARDIAL PERFUSION IMAGING
CSEPHR: 88 %
Estimated workload: 13.4 METS
Exercise duration (min): 12 min
Exercise duration (sec): 30 s
LV dias vol: 109 mL
LVSYSVOL: 65 mL
MPHR: 153 {beats}/min
NUC STRESS TID: 1
Peak HR: 136 {beats}/min
RATE: 0.33
RPE: 18
Rest HR: 38 {beats}/min
SDS: 3
SRS: 0
SSS: 3

## 2014-12-13 MED ORDER — TECHNETIUM TC 99M SESTAMIBI GENERIC - CARDIOLITE
32.9000 | Freq: Once | INTRAVENOUS | Status: AC | PRN
  Administered 2014-12-13: 32.9 via INTRAVENOUS

## 2014-12-13 MED ORDER — TECHNETIUM TC 99M SESTAMIBI GENERIC - CARDIOLITE
10.3000 | Freq: Once | INTRAVENOUS | Status: AC | PRN
  Administered 2014-12-13: 10 via INTRAVENOUS

## 2014-12-20 ENCOUNTER — Encounter: Payer: Self-pay | Admitting: Internal Medicine

## 2014-12-20 ENCOUNTER — Ambulatory Visit (INDEPENDENT_AMBULATORY_CARE_PROVIDER_SITE_OTHER): Payer: Medicare Other | Admitting: Internal Medicine

## 2014-12-20 VITALS — BP 128/68 | HR 53 | Ht 69.0 in | Wt 159.0 lb

## 2014-12-20 DIAGNOSIS — I471 Supraventricular tachycardia: Secondary | ICD-10-CM

## 2014-12-20 DIAGNOSIS — R9439 Abnormal result of other cardiovascular function study: Secondary | ICD-10-CM

## 2014-12-20 DIAGNOSIS — R079 Chest pain, unspecified: Secondary | ICD-10-CM

## 2014-12-20 DIAGNOSIS — R931 Abnormal findings on diagnostic imaging of heart and coronary circulation: Secondary | ICD-10-CM | POA: Diagnosis not present

## 2014-12-20 MED ORDER — AMLODIPINE BESYLATE 2.5 MG PO TABS: 2.5000 mg | ORAL_TABLET | Freq: Every day | ORAL | Status: DC

## 2014-12-20 NOTE — Progress Notes (Signed)
Patient Care Team: Noralee Space, MD as PCP - General (Pulmonary Disease)   HPI  Jeff Graves is a 69 y.o. male  Seen in followup for atrial tachycardia for which he takes flecainide   He also has a history of hypertension  Stress testing had demonstrated normal blood pressure response normal heart rate response to exercise; during the summer he is also noted episodes of short lasting chest discomfort  . It was unassociated with exertion.it lasted about a minute. Because of this we undertook Myoview scanning which is abnormal.  >>>>Nuclear stress EF: 50%.  There was no ST segment deviation noted during stress.  Defect 1: There is a small defect of mild severity present in the basal inferolateral and mid inferolateral location.  Defect 2: There is a small defect of moderate severity present in the apex location.  Findings consistent with ischemia.  This is an intermediate risk study.  The left ventricular ejection fraction is mildly decreased (45-54%).     He has noted no impairment of exercise tolerance    Past Medical History  Diagnosis Date  . Hypertension   . Hypercholesterolemia   . Diverticulosis of colon   . Hemorrhoids   . Renal calculus   . DDD (degenerative disc disease)   . Shoulder pain   . Sinus bradycardia   . PAC (premature atrial contraction)   . Atrial tachycardia ([place]) 06/04/2012    12/13-QRS duration-102 ms 6/14-QRS duration-98 ms   . Allergy     seasonal    Past Surgical History  Procedure Laterality Date  . None    . Colonoscopy    . Shoulder surgery Left 03/29/2014    Current Outpatient Prescriptions  Medication Sig Dispense Refill  . aspirin EC 81 MG tablet Take 1 tablet (81 mg total) by mouth daily.    [name] atorvastatin (LIPITOR) 10 MG tablet TAKE 1 TABLET (10 MG TOTAL) BY MOUTH DAILY. 30 tablet 6  . flecainide (TAMBOCOR) 100 MG tablet TAKE 1 TABLET (100 MG TOTAL) BY MOUTH 2 (TWO) TIMES DAILY. 60 tablet 6  . loratadine  (CLARITIN) 10 MG tablet Take 10 mg by mouth daily.     [name] losartan (COZAAR) 100 MG tablet TAKE 1/2 TABLET (100 MG TOTAL) BY MOUTH DAILY.     No current facility-administered medications for this visit.    No Known Allergies  Review of Systems negative except from HPI and PMH  Physical Exam There were no vitals taken for this visit. Well developed and well nourished in no acute distress HENT normal E scleral and icterus clear Neck Supple JVP flat; carotids brisk and full Clear to ausculation  Regular rate and rhythm, soft S1  no murmurs gallops or rub Soft with active bowel sounds No clubbing cyanosis none Edema Alert and oriented, grossly normal motor and sensory function Skin Warm and Dry  ecg  demonstrates sinus rhythm at 47 Intervals 19/10/44 O/w  normal  Assessment and  Plan  Chest pain atypical  Exercise intolerance  Atrial tachycardia-flecainide  Lightheadedness  Sinus bradycardia  Hypertension  He has had no symptomatically palpitations.   The review his Myoview and death. Chronotropic incompetence is evident. There also perfusion defects that worsened with exercise consistent with ischemia. He is not having significant symptoms. The approach will be medical therapy guided by his modest symptoms. We will begin him on a low-dose calcium blocker amlodipine. Chronotropic effects.  In the event that he has recurrent atrial arrhythmia which were previously manifested as  lightheadedness with exercise we will have to consider antiarrhythmic therapy if the frequency does not permit ablation. That would likely be limited in the short term to dofetilide given his sinus bradycardia.  He has purchased a FitBit. We have discussed using it only as a heart rate determine her crit also hasn't able to graphically portray regularity will help [place] by looking at this information as well

## 2014-12-20 NOTE — Patient Instructions (Signed)
Medication Instructions: 1) Stop flecainide 2) Start norvasc (amlodipine) 2.5 mg one tablet by mouth once daily  Labwork: - none  Procedures/Testing: - none  Follow-Up: - Your physician recommends that you schedule a follow-up appointment in: 3 months with Dr. Caryl Comes.  Any Additional Special Instructions Will Be Listed Below (If Applicable).

## 2015-01-17 ENCOUNTER — Other Ambulatory Visit: Payer: Self-pay | Admitting: Pulmonary Disease

## 2015-03-10 ENCOUNTER — Ambulatory Visit (INDEPENDENT_AMBULATORY_CARE_PROVIDER_SITE_OTHER): Payer: PPO | Admitting: Pulmonary Disease

## 2015-03-10 ENCOUNTER — Ambulatory Visit (INDEPENDENT_AMBULATORY_CARE_PROVIDER_SITE_OTHER)
Admission: RE | Admit: 2015-03-10 | Discharge: 2015-03-10 | Disposition: A | Discharge status: Home / Self Care | Payer: PPO | Source: Ambulatory Visit | Attending: Pulmonary Disease | Admitting: Pulmonary Disease

## 2015-03-10 ENCOUNTER — Encounter: Payer: Self-pay | Admitting: Pulmonary Disease

## 2015-03-10 VITALS — BP 130/82 | HR 68 | Temp 98.3°F | Ht 70.0 in | Wt 156.8 lb

## 2015-03-10 DIAGNOSIS — R059 Cough, unspecified: Secondary | ICD-10-CM

## 2015-03-10 DIAGNOSIS — R05 Cough: Secondary | ICD-10-CM | POA: Diagnosis not present

## 2015-03-10 DIAGNOSIS — R0602 Shortness of breath: Secondary | ICD-10-CM | POA: Diagnosis not present

## 2015-03-10 MED ORDER — DOXYCYCLINE HYCLATE 100 MG PO TABS: 100.0000 mg | ORAL_TABLET | Freq: Two times a day (BID) | ORAL | Status: DC

## 2015-03-10 NOTE — Patient Instructions (Addendum)
You will be scheduled for chest x-ray. We will prescribe you doxycycline antibiotic for 7 days.

## 2015-03-10 NOTE — Progress Notes (Signed)
   Subjective:    Patient ID: Jeff Graves, male    DOB: 1945-09-07, 70 y.o.   MRN: [id number]  HPI Acute visit for respiratory infection.  Jeff Graves is 70 year old with no significant pulmonary history. He is a patient of Dr. Lenna Gilford. Complains of cough, congestion, sputum production yellow/brown in color. This is associated with chills, generalized body ache, loss of appetite, malaise for one week. He denies any sick contacts.He had flu vaccination this year.  Past Medical History  Diagnosis Date  . Hypertension   . Hypercholesterolemia   . Diverticulosis of colon   . Hemorrhoids   . Renal calculus   . DDD (degenerative disc disease)   . Shoulder pain   . Sinus bradycardia   . PAC (premature atrial contraction)   . Atrial tachycardia ([place]) 06/04/2012    12/13-QRS duration-102 ms 6/14-QRS duration-98 ms   . Allergy     seasonal     Current outpatient prescriptions:  .  amLODipine (NORVASC) 2.5 MG tablet, Take 1 tablet (2.5 mg total) by mouth daily., Disp: 30 tablet, Rfl: 6 .  aspirin EC 81 MG tablet, Take 1 tablet (81 mg total) by mouth daily., Disp: , Rfl:  .  atorvastatin (LIPITOR) 10 MG tablet, TAKE 1 TABLET (10 MG TOTAL) BY MOUTH DAILY., Disp: 30 tablet, Rfl: 5 .  loratadine (CLARITIN) 10 MG tablet, Take 10 mg by mouth daily as needed. , Disp: , Rfl:  .  losartan (COZAAR) 100 MG tablet, TAKE 1/2 TABLET (100 MG TOTAL) BY MOUTH DAILY., Disp: , Rfl:    Review of Systems Cough, sputum production, chills, congestion. Generalized malaise, body ache, loss of appetite, nausea. No chest pain, palpitation. No constipation, diarrhea, abdominal pain. All other review of systems are negative    Objective:   Physical Exam Blood pressure 130/82, pulse 68, temperature 98.3 F (36.8 C), temperature source Oral, height 5\' 10"  (1.778 m), weight 156 lb 12.8 oz (71.124 kg), SpO2 96 %.  Gen: No apparent distress Neuro: No gross focal deficits. Neck: No JVD, lymphadenopathy,  thyromegaly. RS: Bibasilar crackles. No Wheeze. CVS: S1-S2 heard, no murmurs rubs gallops. Abdomen: Soft, positive bowel sounds. Extremities: No edema.    Assessment & Plan:  Respiratory tract infection.  Influenza negative  - Check CXR - Doxycycline 100 mg bid for 7 days  Marshell Garfinkel MD [facility] Pager [phone] If no answer or after 3pm call: [phone] 03/10/2015, 2:39 PM

## 2015-03-10 NOTE — Addendum Note (Signed)
Addended by: Maryanna Shape A on: 03/10/2015 03:16 PM   Modules accepted: Orders

## 2015-03-24 ENCOUNTER — Ambulatory Visit (INDEPENDENT_AMBULATORY_CARE_PROVIDER_SITE_OTHER): Payer: PPO | Admitting: Internal Medicine

## 2015-03-24 ENCOUNTER — Encounter: Payer: Self-pay | Admitting: Internal Medicine

## 2015-03-24 VITALS — BP 130/82 | HR 56 | Ht 70.0 in | Wt 159.4 lb

## 2015-03-24 DIAGNOSIS — R001 Bradycardia, unspecified: Secondary | ICD-10-CM | POA: Diagnosis not present

## 2015-03-24 DIAGNOSIS — I4589 Other specified conduction disorders: Secondary | ICD-10-CM | POA: Diagnosis not present

## 2015-03-24 DIAGNOSIS — I471 Supraventricular tachycardia: Secondary | ICD-10-CM

## 2015-03-24 NOTE — Patient Instructions (Signed)
Medication Instructions: 1) Start Ranexa 500 mg one tablet by mouth twice daily  Labwork: - none  Procedures/Testing: - none  Follow-Up: - Your physician recommends that you schedule a follow-up appointment in: 8 weeks with Tommye Standard, PA for Dr. Caryl Comes (on a day Dr. Caryl Comes is here in the office).   Any Additional Special Instructions Will Be Listed Below (If Applicable).     If you need a refill on your cardiac medications before your next appointment, please call your pharmacy.

## 2015-03-24 NOTE — Progress Notes (Signed)
Patient Care Team: Noralee Space, MD as PCP - General (Pulmonary Disease)   HPI  Jeff Graves is a 70 y.o. male  Seen in followup for atrial tachycardia for which he took flecainide   He also has a history of hypertension  Stress testing had demonstrated normal blood pressure response normal heart rate response to exercise; during the summer he is also noted episodes of short lasting chest discomfort  . It was unassociated with exertion.it lasted about a minute. Because of this we undertook Myoview scanning which is abnormal.  >>>>Nuclear stress EF: 50%.  There was no ST segment deviation noted during stress.  Defect 1: There is a small defect of mild severity present in the basal inferolateral and mid inferolateral location.  Defect 2: There is a small defect of moderate severity present in the apex location.  Findings consistent with ischemia.  This is an intermediate risk study.  The left ventricular ejection fraction is mildly decreased (45-54%).  This prompted [place] to stop his flecainide. We added amlodipine as a calcium blocker in the hopes that we would have antiarrhythmic benefit. He has not felt as good off the flecainide.  He has noted no impairment of exercise tolerance    Past Medical History  Diagnosis Date  . Hypertension   . Hypercholesterolemia   . Diverticulosis of colon   . Hemorrhoids   . Renal calculus   . DDD (degenerative disc disease)   . Shoulder pain   . Sinus bradycardia   . PAC (premature atrial contraction)   . Atrial tachycardia ([place]) 06/04/2012    12/13-QRS duration-102 ms 6/14-QRS duration-98 ms   . Allergy     seasonal    Past Surgical History  Procedure Laterality Date  . None    . Colonoscopy    . Shoulder surgery Left 03/29/2014    Current Outpatient Prescriptions  Medication Sig Dispense Refill  . amLODipine (NORVASC) 2.5 MG tablet Take 1 tablet (2.5 mg total) by mouth daily. 30 tablet 6  . aspirin EC 81 MG tablet  Take 1 tablet (81 mg total) by mouth daily.    [name] atorvastatin (LIPITOR) 10 MG tablet TAKE 1 TABLET (10 MG TOTAL) BY MOUTH DAILY. 30 tablet 5  . loratadine (CLARITIN) 10 MG tablet Take 10 mg by mouth daily as needed.     [name] losartan (COZAAR) 100 MG tablet TAKE 1/2 TABLET (100 MG TOTAL) BY MOUTH DAILY.     No current facility-administered medications for this visit.    No Known Allergies  Review of Systems negative except from HPI and PMH  Physical Exam BP 130/82 mmHg  Pulse 56  Ht 5\' 10"  (1.778 m)  Wt 159 lb 6.4 oz (72.303 kg)  BMI 22.87 kg/m2 Well developed and well nourished in no acute distress HENT normal E scleral and icterus clear Neck Supple JVP flat; carotids brisk and full Clear to ausculation  Regular rate and rhythm, soft S1  no murmurs gallops or rub Soft with active bowel sounds No clubbing cyanosis none Edema Alert and oriented, grossly normal motor and sensory function Skin Warm and Dry  ecg  demonstrates sinus rhythm at 47 Intervals 19/10/44 O/w  normal  Assessment and  Plan  Chest pain atypical  Exercise intolerance  Atrial tachycardia-flecainide stopped  Lightheadedness  Sinus bradycardia  Hypertension   BP well controlled  Sinus bradycardia informs antiarrhythmic drug options. Right now I think they are ranolazine and dronaderone. Start with the former. We will  Have him return in about 8 weeks.  In the event that we want to pursue flecainide again, the Myoview scan which prompted [place] to stop it will be reviewed and we  We'll consider CTA versus cath. This is a relatively high hurdle  to jump for this.

## 2015-04-17 ENCOUNTER — Other Ambulatory Visit (INDEPENDENT_AMBULATORY_CARE_PROVIDER_SITE_OTHER): Payer: PPO

## 2015-04-17 ENCOUNTER — Encounter: Payer: Self-pay | Admitting: Pulmonary Disease

## 2015-04-17 ENCOUNTER — Ambulatory Visit (INDEPENDENT_AMBULATORY_CARE_PROVIDER_SITE_OTHER): Payer: PPO | Admitting: Pulmonary Disease

## 2015-04-17 VITALS — BP 138/78 | HR 54 | Temp 97.4°F | Ht 70.0 in | Wt 162.0 lb

## 2015-04-17 DIAGNOSIS — I10 Essential (primary) hypertension: Secondary | ICD-10-CM | POA: Diagnosis not present

## 2015-04-17 DIAGNOSIS — Z0000 Encounter for general adult medical examination without abnormal findings: Secondary | ICD-10-CM | POA: Diagnosis not present

## 2015-04-17 DIAGNOSIS — K5730 Diverticulosis of large intestine without perforation or abscess without bleeding: Secondary | ICD-10-CM

## 2015-04-17 DIAGNOSIS — I471 Supraventricular tachycardia: Secondary | ICD-10-CM

## 2015-04-17 DIAGNOSIS — Z87442 Personal history of urinary calculi: Secondary | ICD-10-CM

## 2015-04-17 DIAGNOSIS — M5010 Cervical disc disorder with radiculopathy, unspecified cervical region: Secondary | ICD-10-CM

## 2015-04-17 DIAGNOSIS — Z9889 Other specified postprocedural states: Secondary | ICD-10-CM

## 2015-04-17 DIAGNOSIS — E7800 Pure hypercholesterolemia, unspecified: Secondary | ICD-10-CM

## 2015-04-17 LAB — COMPREHENSIVE METABOLIC PANEL
ALBUMIN: 3.9 g/dL (ref 3.5–5.2)
ALT: 16 U/L (ref 0–53)
AST: 17 U/L (ref 0–37)
Alkaline Phosphatase: 61 U/L (ref 39–117)
BUN: 17 mg/dL (ref 6–23)
CHLORIDE: 102 meq/L (ref 96–112)
CO2: 28 mEq/L (ref 19–32)
Calcium: 9.3 mg/dL (ref 8.4–10.5)
Creatinine, Ser: 1.31 mg/dL (ref 0.40–1.50)
GFR: 57.81 mL/min — ABNORMAL LOW (ref >60.00)
Glucose, Bld: 94 mg/dL (ref 70–99)
POTASSIUM: 5 meq/L (ref 3.5–5.1)
SODIUM: 135 meq/L (ref 135–145)
Total Bilirubin: 0.7 mg/dL (ref 0.2–1.2)
Total Protein: 6.2 g/dL (ref 6.0–8.3)

## 2015-04-17 LAB — LIPID PANEL
CHOLESTEROL: 167 mg/dL (ref 0–200)
HDL: 81.5 mg/dL (ref >39.00)
LDL CALC: 79 mg/dL (ref 0–99)
NonHDL: 85.6
Total CHOL/HDL Ratio: 2
Triglycerides: 32 mg/dL (ref 0.0–149.0)
VLDL: 6.4 mg/dL (ref 0.0–40.0)

## 2015-04-17 LAB — VITAMIN D 25 HYDROXY (VIT D DEFICIENCY, FRACTURES): VITD: 20.59 ng/mL — ABNORMAL LOW (ref 30.00–100.00)

## 2015-04-17 LAB — PSA: PSA: 1.94 ng/mL (ref 0.10–4.00)

## 2015-04-17 LAB — TSH: TSH: 1.57 u[IU]/mL (ref 0.35–4.50)

## 2015-04-17 MED ORDER — ATORVASTATIN CALCIUM 10 MG PO TABS: ORAL_TABLET | ORAL | Status: DC

## 2015-04-17 MED ORDER — LOSARTAN POTASSIUM 100 MG PO TABS: ORAL_TABLET | ORAL | Status: DC

## 2015-04-17 NOTE — Progress Notes (Signed)
Subjective:    Patient ID: Jeff Graves, male    DOB: November 09, 1945, 70 y.o.   MRN: [id number]  HPI 70 y/o WM here for afollow up visit... he is followed for HBP, cardiac arrhythmia, & Chol, w/ a past hx of kidney stones & cspine dz...  ~  SEE PREV EPIC NOTES FOR OLDER DATA >>   ~  January 23, 2012:  Yearly ROV & CPX> Jeff Graves reports another good yr w/o new complaints or concerns; he does, however, c/o some intermittent dizziness after strenuous activity- no CP or palpit or syncope; this is vague & he exercises at the gym on treadmill w/o difficulty he says; he notes several friends w/ MIs & CABG- he is concerned and asking for Cardiac eval "to see what's going on"; Note: he does have marked SBrady (no BBlockers) and Cardiac eval w/ treadmill & holter is warranted...    HBP> on Losar100; BP= 118/68 w/o postural change; he denies CP, palpit, SOB, edema; but notes the intermittent dizziness w/ strenuous activity- refer to Cards...    SBrady> he has marked sinus brady & not on BBlockers, etc; notes some dizziness w/ strenuous activ but no syncope; suggest Cardiac eval w/ treadmill & holter for further eval...    Chol> on Lip20-1/2 tab daily; FLP showed TChol 161, TG 27, HDL 77, LDL 79    GI- Divertics, Hems> no GI complaints- w/o abd pain, n/v, c/d, blood seen; last colon was 2004 & f/u due soon...    DJD> on Celebrex200 prn; known DDD Cspine w/ mild sp stenosis- treated concervatuively & doing satis;     Kidney Stones> hx several stones in the past; he passed them all, last 6-26yrs ago; no current symptoms...    Seborrheic Dermatitis> told by Jeff Graves that it was a form of Psoriasis 7 treated w/ a topical cream- improved... We reviewed prob list, meds, xrays and labs> see below for updates >> given 2013 Flu shot & TDAP today...  EKG 12/13 showed SBrady, rate39, NAD, borderline tracing...  LABS 12/13:  FLP- at goals on Lip10;  Chems- wnl;  CBC- wnl;  TSH=1.59;  PSA=1.22;  UA- clear   ~   January 22, 2013:  Yearly [name] reports a good year, no new complaints or concerns> he had thorough cardiac eval in 2014 by [place] w/ EKGs, 2DEcho, GXT, Holter (see below); found to have an atrial tachycardia & responded nicely to Flecainide w/o recurrent tachypalpitations; Cards did decr his Losartan to 50mg  due to some dizziness and all symptoms resolved... We reviewed the following medical problems during today's office visit >>     HBP> on Losar100-1/2 tab; BP= 110/60 w/o postural change; he denies CP, palpit, SOB, edema...    Cardiac arrhythmia> SBrady- Atrial Tachy to 190- PACs; DrKlein placed him on Flecainide 100mg Bid & he is improved...    Chol> on Lip20-1/2 tab daily; FLP 12/14 showed TChol 183, TG 11, HDL 94, LDL 87...    GI- Divertics, Hems> no GI complaints- w/o abd pain, n/v, c/d, blood seen; last colon was 2004 & f/u due soon...    DJD> on Celebrex200 prn; known DDD Cspine w/ mild sp stenosis- treated conservatively & doing satis...     Kidney Stones> hx several stones in the past; he passed them all, last 7-15yrs ago; no current symptoms...    Seborrheic Dermatitis> told by Jeff Graves that it was a form of Psoriasis & treated w/ a topical cream- improved... We reviewed prob  list, meds, xrays and labs> see below for updates >> he had the 2014 Flu vaccine 10/14...   CXR 12/14 showed norm heart size, clear lungs, NAD...  LABS 12/14:  FLP- at goals on Lip10 & HDL=94;  Chems- wnl w/ Ct=1.4;  CBC- wnl;  TSH=2.48;  PSA=1.88...   ~  April 15, 2014:  96mo ROV & [name] reports another good year overall- he recently had left rotator cuff surg (2/16 by [name]); he is now getting rehab & is improved... We reviewed the following medical problems during today's office visit >>     HBP> on Losar100-1/2 tab; BP= 130/82 w/o postural change; he denies CP, palpit, SOB, edema...    Cardiac arrhythmia> SBrady- Atrial Tachy to 190- PACs; DrKlein placed him on Flecainide  100mg Bid & he is improved...    Chol> on Lip10 daily; FLP 3/16 showed TChol 165, TG 43, HDL 76, LDL 81...    GI- Divertics, Hems> no GI complaints- w/o abd pain, n/v, c/d, blood seen; he had f/u colonoscopy 2/15 by [name]- NEG x sm int hems; he rec repeat colon in 77yrs...    DJD> on Celebrex200 prn; known DDD Cspine w/ mild sp stenosis- treated conservatively & doing satis; he fell w/ left shoulder injury, eval by Ortho & he had rotator cuff surg 2/16- now in rehab & improved...Jeff KitchenMarland KitchenMarland Graves     Kidney Stones> hx several stones in the past; he passed them all, last 8-36yrs ago; no current symptoms...    Seborrheic Dermatitis> told by Jeff Graves that it was a form of Psoriasis & treated w/ a topical cream- improved... We reviewed prob list, meds, xrays and labs> see below for updates >> he had 2015 flu vaccine last Nov; given PREVNAR-13 today...  LABS 3/16:  FLP- wnl on Lip10;  Chems- wnl;  CBC- wnl;  TSH=1.55;  PSA=1.89...  ~  April 17, 2015:  Yearly [place] says he is doing well, no new complaints or concerns, still working his heating & air job at 32;  He had a bout of bronchitis treated in Jan2017 by Jeff Graves w/ Doxycycline, CXR was clear;  He saw DrKlein 03/24/15 for f/u HBO & atrial tachy> Myoview was done 12/2014 & showed EF=50% & 2 small reversible defects in the distal LAD & LCx territories- an intermediate risk study; Flecainide was stopped & Amlodipine started, then RANEXA started & follow up is on-going w/ Cards... we reviewed the following medical problems during today's office visit >>     HBP> on ASA81 & Losar100-1/2 tab; BP= 138/78 w/o postural change; he denies CP, palpit, SOB, edema...    Cardiac arrhythmia> SBrady- Atrial Tachy to 190- PACs; DrKlein placed him on Flecainide 100mg Bid => switched to RANEXA 500Bid w/ adjustments on-going...    Chol> on Lip10 daily; Jeff Graves 3/17 showed TChol 167, TG 32, HDL 82, LDL 79... Continue same.    GI- Divertics, Hems> no GI complaints- w/o abd pain,  n/v, c/d, blood seen; he had f/u colonoscopy 2/15 by [name]- NEG x sm int hems; he rec repeat colon in 59yrs...    DJD> on Celebrex200 prn; known DDD Cspine w/ mild sp stenosis- treated conservatively & doing satis; he fell w/ left shoulder injury, eval by Ortho & he had rotator cuff surg 2/16- now in rehab & improved...Jeff KitchenMarland KitchenMarland Graves     Kidney Stones> hx several stones in the past; he passed them all, last 8-55yrs ago; no current symptoms...    Seborrheic Dermatitis> told by Jeff Graves that it was a  form of Psoriasis & treated w/ a topical cream- improved... EXAM shows Afeb, VSS, O2sat=99% on RA;  HEENT- neg, mallampati1;  Chest- clear w/o m/r/g;  Heart- RR w/o m/r/g heard;  Abd- soft, nontender, neg;  Ext- neg w/o c/c/e;  Neuro- intact...  CXR 03/10/15>  Norm heart size, sl hyperinflation, clear- NAD...   LABS 04/17/15>  FLP- at goals on Atorva10;  Chems- wnl, Cr=1.31;  TSH=1.57;  VitD=21;  PSA=1.94 IMP/PLAN>>  Kysin is stable, meds refilled, needs ~2000u VitD daily, cardiac care is on-going w/ DrKlein...           Problem List:    HYPERTENSION (ICD-401.9) - controlled on LOSARTAN Rx... ~  12/10: notes sl postural dizziness on & off so we decided to change Hyzaar to LOSARTAN 100mg /d. ~  [id number]:  doing very well on the Losartan 100mg /d- BP 110/70 & similar at home;  CXR is clear, WNL;  EKG shows sinus brady, NAD... ~  12/12:  BP= 120/68 & he remains asymptomatic;  CXR remains clear;  EKG shows SBrady rate46 otherw wnl... ~  12/13: on Losar100; BP= 118/68 w/o postural change; he denies CP, palpit, SOB, edema; but notes the intermittent dizziness w/ strenuous activity- refer to Cards. ~  12/14: on Losar100-1/2 tab; BP= 110/60 w/o postural change; he denies CP, palpit, SOB, edema...  Dizziness, Tachy-Brady> he has marked sinus brady & not on BBlockers, etc; notes some dizziness w/ strenuous activ but no syncope; suggest Cardiac eval w/ treadmill & holter for further eval...  ~  EKG 12/13 showed SBrady,  rate39, NAD, borderline tracing... ~  2DEcho 2/14 showed norm LV size 7 function, EF=55-60%, Gr1DD, mildly thickened leaflets, mildAI, mildMR, modPR, PAsys=33... ~  GXT 2/14 showed exercise to 11 mets, no ischemia, no angina, NSSTTWA... ~  Holter monitor 3/14 showed atrial tachy to 190, PACs, NSR & SBrady ~  4/14: Cards decided on a trial of Flecainide- 100mg Bid w/ resolution of his tachypalpit... ~  EKG 6/14 by DrKlein showed SBrady, rate49, otherw wnl...  ~  12/14: SBrady- Atrial Tachy to 190- PACs; DrKlein placed him on Flecainide 100mg Bid & he is improved ~  3/16:  He remains stable on Flecainide100Bid; last saw DrKlein 9/15- noted some atypCP but felt to be stable... ~  11/16:  Myoview 12/2014 showed EF=50% & 2 small reversible defects in the distal LAD & LCx territories- an intermediate risk study  HYPERCHOLESTEROLEMIA (ICD-272.0) - controlled on diet and LIPITOR 10mg /d... ~  [place] 11/07 showed TChol 147, TG 24, HDL 61, LDL 81... ~  [place] 12/08 showed TChol 167, TG 25, HDL 69, LDL 93... continue same Rx. ~  FLP 12.09 showed TChol 163, TG 20, HDL 66. LDL 93... rec- same. ~  FLP 12/10 showed TChol 161, TG 20, HDL 83, LDL 74 ~  FLP 12/11 on Lip10 showed TChol 155, TG 24, HDL 66, LDL 84 ~  FLP 12/12 on Lip10 showed TChol 166, TG 17, HDL 74, LDL 89 ~  FLP 12/13 on Lip10 showed TChol 161, TG 27, HDL 77, LDL 79 ~  FLP 12/14 on Lip10 showed TChol 183, TG 11, HDL 94, LDL 87 ~  FLP 3/16 on Lip10 showed TChol 165, TG 43, HDL 76, LDL 81 ~  FLP 3/17 on Atorva10 showed TChol 167, TG 32, HDL 82, LDL 79  DIVERTICULITIS, COLON (ICD-562.11) & HEMORRHOIDS (ICD-455.6) >>  ~  last colonoscopy 11/04 by [name] w/ divertics & int hems, f/u planned 27yrs per GI protocol... ~  he had f/u colonoscopy  2/15 by [name]- NEG x sm int hems; he rec repeat colon in 91yrs...  DEGENERATIVE DISC DISEASE, CERVICAL SPINE (ICD-722.4) & SHOULDER PAIN (ICD-719.41) - eval 6/97 by Marciano Sequin w/ MRI showing DDD & mild sp stenosis  w/o nerve root impingement - Rx conservatively;  increased pain in 2/06 w/ eval DrElsner and MRI showing spondylosis at 3 levels, again Rx conserv...  he uses CELEBREX 200mg  Prn for pain... ~  2/16: he fell w/ left shoulder pain & eval by [name]- he had left rotator cuff surg 2/16, followed by rehab, improved...  RENAL CALCULUS, HX OF (ICD-V13.01) - he's had 3 stones in the past and managed to pass all of them... last about 6 years ago... no prob currently.  VITAMIN D Deficiency >> Vit D level 3/17 = 21 & rec to start VitD 2000u daily...  RASH >> looked like seb dermatitis to me but Derm told him it was a form of Psoriasis & Rx by DrAJordan w/ topical cream==> improved...  Health Maint - on ASA 81mg /d...  ~  GI:  Followed by [name] & he is up to date w/ colon done 2/15- neg... ~  GU:  His DRE is neg/ wnl & PSAs have been wnl as well... ~  Immuniz:  He gets the yearly flu vaccines;  He had TDAP 12/13;  He had remote Pneumovax 2003;  Given PREVNAR-13 3/16...   Past Surgical History  Procedure Laterality Date  . None    . Colonoscopy    . Shoulder surgery Left 03/29/2014     Outpatient Encounter Prescriptions as of 04/17/2015  Medication Sig  . aspirin EC 81 MG tablet Take 1 tablet (81 mg total) by mouth daily.  Jeff Graves atorvastatin (LIPITOR) 10 MG tablet TAKE 1 TABLET (10 MG TOTAL) BY MOUTH DAILY.  Jeff Graves loratadine (CLARITIN) 10 MG tablet Take 10 mg by mouth daily as needed.   Jeff Graves losartan (COZAAR) 100 MG tablet TAKE 1/2 TABLET (100 MG TOTAL) BY MOUTH DAILY.  . ranolazine (RANEXA) 500 MG 12 hr tablet Take 1 tablet (500 mg total) by mouth 2 (two) times daily.  . [DISCONTINUED] atorvastatin (LIPITOR) 10 MG tablet TAKE 1 TABLET (10 MG TOTAL) BY MOUTH DAILY.  . [DISCONTINUED] losartan (COZAAR) 100 MG tablet TAKE 1/2 TABLET (100 MG TOTAL) BY MOUTH DAILY.  . [DISCONTINUED] amLODipine (NORVASC) 2.5 MG tablet Take 1 tablet (2.5 mg total) by mouth daily. (Patient not taking: Reported on 04/17/2015)   No  facility-administered encounter medications on file as of 04/17/2015.    No Known Allergies   Current Medications, Allergies, Past Medical History, Past Surgical History, Family History, and Social History were reviewed in [employer] record.    Review of Systems    The patient denies fever, chills, sweats, anorexia, fatigue, weakness, malaise, weight loss, sleep disorder, blurring, diplopia, eye irritation, eye discharge, vision loss, eye pain, photophobia, earache, ear discharge, tinnitus, decreased hearing, nasal congestion, nosebleeds, sore throat, hoarseness, chest pain, palpitations, syncope, dyspnea on exertion, orthopnea, PND, peripheral edema, cough, dyspnea at rest, excessive sputum, hemoptysis, wheezing, pleurisy, nausea, vomiting, diarrhea, constipation, change in bowel habits, abdominal pain, melena, hematochezia, jaundice, gas/bloating, indigestion/heartburn, dysphagia, odynophagia, dysuria, hematuria, urinary frequency, urinary hesitancy, nocturia, incontinence, back pain, joint pain, joint swelling, muscle cramps, muscle weakness, stiffness, arthritis, sciatica, restless legs, leg pain at night, leg pain with exertion, rash, itching, dryness, suspicious lesions, paralysis, paresthesias, seizures, tremors, vertigo, transient blindness, frequent falls, frequent headaches, difficulty walking, depression, anxiety, memory loss, confusion, cold intolerance, heat intolerance,  polydipsia, polyphagia, polyuria, unusual weight change, abnormal bruising, bleeding, enlarged lymph nodes, urticaria, allergic rash, hay fever, and recurrent infections.     Objective:   Physical Exam    WD, WN, 70 y/o WM in NAD... GENERAL:  Alert & oriented; pleasant & cooperative... HEENT:  [place]/AT, EOM-wnl, PERRLA, EACs-clear, TMs-wnl, NOSE-clear, THROAT-clear & wnl. NECK:  Supple w/ fairROM; no JVD; normal carotid impulses w/o bruits; no thyromegaly or nodules palpated; no  lymphadenopathy. CHEST:  Clear to P & A; without wheezes/ rales/ or rhonchi. HEART:  Regular Rhythm; without murmurs/ rubs/ or gallops. ABDOMEN:  Soft & nontender; normal bowel sounds; no organomegaly or masses detected. RECTAL:  Neg - prostate 2+ & nontender w/o nodules; stool hematest neg. EXT: without deformities or arthritic changes; no varicose veins/ venous insuffic/ or edema. NEURO:  CN's intact; motor testing normal; sensory testing normal; gait normal & balance OK. DERM:  Improved seborrheic dermatitis in scalp (dandruff), some on face, and arms...  RADIOLOGY DATA:  Reviewed in the EPIC EMR & discussed w/ the patient...  LABORATORY DATA:  Reviewed in the EPIC EMR & discussed w/ the patient...   Assessment & Plan:      HBP>  Controlled on Losartan50; continue same...  Cardiac Arrhythmia>  he has marked sinus brady & not on BBlockers, etc; noted some dizziness w/ strenuous activ but no syncope; referred to Cards w/ full eval [facility] & DrKlein- Holter w/ SVT & treated w/ Tambocor100Bid & improved... ~  Abnormal Myoview as noted... ~  On-going medication adjustments by DrKlein...  CHOL>  FLP looks great on Lip10...   GI> Divertics, Hems>  Stable, asymptomatic, his follow up colon is due soon & he will call GI to set this up...  Hx Kidney Stones>  Stable, no recurrence...  DJD> CSpine, Shoulder pain>  He uses Celebrex as needed...  Vit D Deficiency>  Vit D level Mar2017 = 21 & started on VitD 2000u daily...  Other medical problems as noted...   Patient's Medications  New Prescriptions   No medications on file  Previous Medications   ASPIRIN EC 81 MG TABLET    Take 1 tablet (81 mg total) by mouth daily.   LORATADINE (CLARITIN) 10 MG TABLET    Take 10 mg by mouth daily as needed.    RANOLAZINE (RANEXA) 500 MG 12 HR TABLET    Take 1 tablet (500 mg total) by mouth 2 (two) times daily.  Modified Medications   Modified Medication Previous Medication   ATORVASTATIN  (LIPITOR) 10 MG TABLET atorvastatin (LIPITOR) 10 MG tablet      TAKE 1 TABLET (10 MG TOTAL) BY MOUTH DAILY.    TAKE 1 TABLET (10 MG TOTAL) BY MOUTH DAILY.   LOSARTAN (COZAAR) 100 MG TABLET losartan (COZAAR) 100 MG tablet      TAKE 1/2 TABLET (100 MG TOTAL) BY MOUTH DAILY.    TAKE 1/2 TABLET (100 MG TOTAL) BY MOUTH DAILY.  Discontinued Medications   AMLODIPINE (NORVASC) 2.5 MG TABLET    Take 1 tablet (2.5 mg total) by mouth daily.

## 2015-04-17 NOTE — Patient Instructions (Signed)
Today we updated your med list in our EPIC system...    Continue your current medications the same...  Today we checked your FASTING blood work...    We will contact you w/ the results when available...   Keep up the good work w/ diet & exercise...  You are working too hard (60H per week at age 70)    I rec that you slow down & smell the.[name] FREON!  Call for any questions...  Let's plan a follow up visit in 54yr, sooner if needed for problems.Marland KitchenMarland Kitchen

## 2015-04-18 ENCOUNTER — Encounter: Payer: Self-pay | Admitting: Physician Assistant

## 2015-04-18 ENCOUNTER — Other Ambulatory Visit: Payer: Self-pay | Admitting: *Deleted

## 2015-04-18 NOTE — Progress Notes (Signed)
Quick Note:  Called spoke with patient, advised of lab results / recs as stated by PM. Pt verbalized his understanding and denied any questions. VitD 2000u added to med list. ______

## 2015-04-18 NOTE — Progress Notes (Signed)
Error

## 2015-04-18 NOTE — Progress Notes (Signed)
Quick Note:  CORRECTION: Labs results / recs discussed with patient as stated by SN. Pt voiced understanding and denied any questions / concerns. ______

## 2015-04-19 ENCOUNTER — Telehealth: Payer: Self-pay | Admitting: Internal Medicine

## 2015-04-19 NOTE — Telephone Encounter (Signed)
pt called for samples, called and informed him i put a 2 samples put up front. Pt in agreement.

## 2015-04-19 NOTE — Telephone Encounter (Signed)
New message ° ° ° ° ° °Patient calling the office for samples of medication: ° ° °1.  What medication and dosage are you requesting samples for? ranexa 500mg ° °2.  Are you currently out of this medication? Have a few pills left ° ° °

## 2015-05-16 ENCOUNTER — Ambulatory Visit: Payer: PPO | Admitting: Physician Assistant

## 2015-05-18 ENCOUNTER — Encounter: Payer: Self-pay | Admitting: Physician Assistant

## 2015-05-18 ENCOUNTER — Ambulatory Visit (INDEPENDENT_AMBULATORY_CARE_PROVIDER_SITE_OTHER): Payer: PPO | Admitting: Physician Assistant

## 2015-05-18 VITALS — BP 117/77 | HR 62 | Ht 70.0 in | Wt 164.0 lb

## 2015-05-18 DIAGNOSIS — I471 Supraventricular tachycardia: Secondary | ICD-10-CM | POA: Diagnosis not present

## 2015-05-18 DIAGNOSIS — E785 Hyperlipidemia, unspecified: Secondary | ICD-10-CM

## 2015-05-18 DIAGNOSIS — I10 Essential (primary) hypertension: Secondary | ICD-10-CM

## 2015-05-18 DIAGNOSIS — R001 Bradycardia, unspecified: Secondary | ICD-10-CM

## 2015-05-18 MED ORDER — RANOLAZINE ER 500 MG PO TB12: 500.0000 mg | ORAL_TABLET | Freq: Two times a day (BID) | ORAL | Status: DC

## 2015-05-18 NOTE — Patient Instructions (Addendum)
Medication Instructions:   Your physician recommends that you continue on your current medications as directed. Please refer to the Current Medication list given to you today.   If you need a refill on your cardiac medications before your next appointment, please call your pharmacy.  Labwork:  NONE ORDER TODAY    Testing/Procedures: NONE ORDER TODAY    Follow-Up:  Your physician wants you to follow-up in:  IN [place].[name]  You will receive a reminder letter in the mail two months in advance. If you don't receive a letter, please call our office to schedule the follow-up appointment.    Any Other Special Instructions Will Be Listed Below (If Applicable).

## 2015-05-18 NOTE — Progress Notes (Signed)
Cardiology Office Note Date:  05/18/2015  Patient ID:  Jeff Graves, Jeff Graves October 05, 1945, MRN [id number] PCP:  Noralee Space, MD  Cardiologist/electrophysiologist: Dr. Caryl Comes   Chief Complaint: planned f/u visit  History of Present Illness: Jeff Graves is a 70 y.o. male with history of atrial tachycardia, HTN, HLD, comes today being seen for Dr. Caryl Comes feeling quite well.  He states he has minimal symptoms at all with the ranexa.  He mentions that when he rushes up the stairs he may feel slightly lightheaded, much improved then before and he states as good or better then he did with the Flecainide that he states he felt controlled his symptom well.  He goes tot he gym 3x week with good exertional capacity and tolerance, he denies any kind of CP or SOB, no near syncope or syncope.   atrial tachycardia, historically on Flecainide, though stopped after abnormal stress testing and started on amlodipine as a CCB.  He was seen by Dr. Caryl Comes who noted sinus bradycardia, was started on Ranexa for antiarrhythmic option.  Note mentions if need to use Flecainide again he would need further ischemic eval such as cath or CTA   Past Medical History  Diagnosis Date  . Hypertension   . Hypercholesterolemia   . Diverticulosis of colon   . Hemorrhoids   . Renal calculus   . DDD (degenerative disc disease)   . Shoulder pain   . Sinus bradycardia   . PAC (premature atrial contraction)   . Atrial tachycardia ([place]) 06/04/2012    12/13-QRS duration-102 ms 6/14-QRS duration-98 ms   . Allergy     seasonal    Past Surgical History  Procedure Laterality Date  . None    . Colonoscopy    . Shoulder surgery Left 03/29/2014    Current Outpatient Prescriptions  Medication Sig Dispense Refill  . aspirin EC 81 MG tablet Take 1 tablet (81 mg total) by mouth daily.    [name] atorvastatin (LIPITOR) 10 MG tablet TAKE 1 TABLET (10 MG TOTAL) BY MOUTH DAILY. 30 tablet 11  . Cholecalciferol (VITAMIN D) 2000 units CAPS  Take 2,000 Units by mouth daily.    [name] loratadine (CLARITIN) 10 MG tablet Take 10 mg by mouth daily as needed.     [name] losartan (COZAAR) 100 MG tablet TAKE 1/2 TABLET (100 MG TOTAL) BY MOUTH DAILY. 30 tablet 11  . ranolazine (RANEXA) 500 MG 12 hr tablet Take 1 tablet (500 mg total) by mouth 2 (two) times daily. 60 tablet 6   No current facility-administered medications for this visit.    Allergies:   Review of patient's allergies indicates no known allergies.   Social History:  The patient  reports that he quit smoking about 31 years ago. His smoking use included Cigarettes. He has a 10 pack-year smoking history. He has never used smokeless tobacco. He reports that he does not drink alcohol or use illicit drugs.   Family History:  The patient's family history includes Breast cancer in his sister; Heart attack (age of onset: 73) in his father; Lymphoma in his sister; Valvular heart disease (age of onset: 65) in his mother. There is no history of Colon cancer, Esophageal cancer, Rectal cancer, or Stomach cancer.  ROS:  Please see the history of present illness.    All other systems are reviewed and otherwise negative.   PHYSICAL EXAM:  VS:  BP 117/77 mmHg  Pulse 62  Ht 5\' 10"  (1.778 m)  Wt 164 lb (74.39  kg)  BMI 23.53 kg/m2 BMI: Body mass index is 23.53 kg/(m^2). Well nourished, well developed, in no acute distress HEENT: normocephalic, atraumatic Neck: no JVD, carotid bruits or masses Cardiac:  normal S1, S2; RRR; no significant murmurs, no rubs, or gallops Lungs:  clear to auscultation bilaterally, no wheezing, rhonchi or rales Abd: soft, nontender MS: no deformity or atrophy Ext: no edema Skin: warm and dry, no rash Neuro:  No gross deficits appreciated Psych: euthymic mood, full affect   EKG:  Done 03/24/15 is SB 56bpm  12/13/14: stress myoview Myoview scanning which is abnormal.  >>>>Nuclear stress EF: 50%.  There was no ST segment deviation noted during stress.  Defect 1:  There is a small defect of mild severity present in the basal inferolateral and mid inferolateral location.  Defect 2: There is a small defect of moderate severity present in the apex location.  Findings consistent with ischemia.  This is an intermediate risk study.  The left ventricular ejection fraction is mildly decreased (45-54%).  03/30/12: Echocardiogram Study Conclusions - Left ventricle: The cavity size was normal. Wall thickness was normal. Systolic function was normal. The estimated ejection fraction was in the range of 55% to 60%. Doppler parameters are consistent with abnormal left ventricular relaxation (grade 1 diastolic dysfunction). - Aortic valve: Mild regurgitation. - Mitral valve: Mild regurgitation. - Pulmonic valve: Moderate regurgitation. - Pulmonary arteries: PA peak pressure: 67mm Hg (S).  Recent Labs: 04/17/2015: ALT 16; BUN 17; Creatinine, Ser 1.31; Potassium 5.0; Sodium 135; TSH 1.57  04/17/2015: Cholesterol 167; HDL 81.50; LDL Cholesterol 79; Total CHOL/HDL Ratio 2; Triglycerides 32.0; VLDL 6.4   CrCl cannot be calculated (Patient has no serum creatinine result on file.).   Wt Readings from Last 3 Encounters:  05/18/15 164 lb (74.39 kg)  04/17/15 162 lb (73.483 kg)  03/24/15 159 lb 6.4 oz (72.303 kg)     Other studies reviewed: Additional studies/records reviewed today include: summarized above  ASSESSMENT AND PLAN:  1. Atrial tachycardia     Well controlled with Flecainide stopped secondary to abnormal stress test >> amlodipine, this was stopped with increased symptoms >>> Ranexa, he has done with this, he states as well or better control of  his tachycardia/symptoms as he did with the Flecainide.  2. HTN     appears well controlled  3. HLD    Looks good, LFTs wnl  4. Bradycardia     Asymptomatic, HR 62 here today  5. Abnormal stress test     No symptoms, goes to the gym 3x week without symptoms or changes in his exertional  capacity     In review of Dr. Olin Pia notes, medical therapy given modest symptoms at that time and now no symptoms reported.  Disposition: F/u in 6 months, sooner if needed.  Current medicines are reviewed at length with the patient today.  The patient did not have any concerns regarding medicines.  Haywood Lasso, PA-C 05/18/2015 3:28 PM     [place] [id number] [phone] (office)  [phone] (fax)

## 2015-05-30 ENCOUNTER — Other Ambulatory Visit: Payer: Self-pay | Admitting: Physician Assistant

## 2015-05-30 DIAGNOSIS — I471 Supraventricular tachycardia: Secondary | ICD-10-CM

## 2016-02-18 ENCOUNTER — Telehealth: Payer: Self-pay | Admitting: Internal Medicine

## 2016-02-18 MED ORDER — PROMETHAZINE HCL 12.5 MG PO TABS: ORAL_TABLET | ORAL | 0 refills | Status: DC

## 2016-02-18 NOTE — Telephone Encounter (Signed)
NVD x this am / no abd pain/ mild fever/chills  Phenergan tablet 12.5 mg  1-2  Up to every 4 hours prn/ clear liquids/ to er if can't keep this down/hold cozar if not taking po well

## 2016-03-26 DIAGNOSIS — H6042 Cholesteatoma of left external ear: Secondary | ICD-10-CM | POA: Diagnosis not present

## 2016-03-26 DIAGNOSIS — H6122 Impacted cerumen, left ear: Secondary | ICD-10-CM | POA: Diagnosis not present

## 2016-03-26 DIAGNOSIS — Z87891 Personal history of nicotine dependence: Secondary | ICD-10-CM | POA: Diagnosis not present

## 2016-03-26 DIAGNOSIS — H9012 Conductive hearing loss, unilateral, left ear, with unrestricted hearing on the contralateral side: Secondary | ICD-10-CM | POA: Diagnosis not present

## 2016-04-16 ENCOUNTER — Encounter: Payer: Self-pay | Admitting: Pulmonary Disease

## 2016-04-16 ENCOUNTER — Other Ambulatory Visit (INDEPENDENT_AMBULATORY_CARE_PROVIDER_SITE_OTHER): Payer: PPO

## 2016-04-16 ENCOUNTER — Ambulatory Visit (INDEPENDENT_AMBULATORY_CARE_PROVIDER_SITE_OTHER): Payer: PPO | Admitting: Pulmonary Disease

## 2016-04-16 VITALS — BP 110/72 | HR 56 | Temp 97.3°F | Ht 70.0 in | Wt 163.2 lb

## 2016-04-16 DIAGNOSIS — E7800 Pure hypercholesterolemia, unspecified: Secondary | ICD-10-CM

## 2016-04-16 DIAGNOSIS — I10 Essential (primary) hypertension: Secondary | ICD-10-CM

## 2016-04-16 DIAGNOSIS — I471 Supraventricular tachycardia: Secondary | ICD-10-CM

## 2016-04-16 DIAGNOSIS — Z87442 Personal history of urinary calculi: Secondary | ICD-10-CM

## 2016-04-16 DIAGNOSIS — E559 Vitamin D deficiency, unspecified: Secondary | ICD-10-CM

## 2016-04-16 DIAGNOSIS — Z23 Encounter for immunization: Secondary | ICD-10-CM

## 2016-04-16 DIAGNOSIS — N320 Bladder-neck obstruction: Secondary | ICD-10-CM

## 2016-04-16 DIAGNOSIS — M5010 Cervical disc disorder with radiculopathy, unspecified cervical region: Secondary | ICD-10-CM

## 2016-04-16 DIAGNOSIS — K5730 Diverticulosis of large intestine without perforation or abscess without bleeding: Secondary | ICD-10-CM

## 2016-04-16 DIAGNOSIS — I4719 Other supraventricular tachycardia: Secondary | ICD-10-CM

## 2016-04-16 DIAGNOSIS — Z9889 Other specified postprocedural states: Secondary | ICD-10-CM

## 2016-04-16 LAB — COMPREHENSIVE METABOLIC PANEL
ALK PHOS: 70 U/L (ref 39–117)
ALT: 14 U/L (ref 0–53)
AST: 13 U/L (ref 0–37)
Albumin: 4 g/dL (ref 3.5–5.2)
BILIRUBIN TOTAL: 0.9 mg/dL (ref 0.2–1.2)
BUN: 14 mg/dL (ref 6–23)
CALCIUM: 9 mg/dL (ref 8.4–10.5)
CO2: 29 meq/L (ref 19–32)
CREATININE: 1.32 mg/dL (ref 0.40–1.50)
Chloride: 103 mEq/L (ref 96–112)
GFR: 57.14 mL/min — AB (ref >60.00)
GLUCOSE: 88 mg/dL (ref 70–99)
Potassium: 4.4 mEq/L (ref 3.5–5.1)
Sodium: 136 mEq/L (ref 135–145)
TOTAL PROTEIN: 6.6 g/dL (ref 6.0–8.3)

## 2016-04-16 LAB — CBC WITH DIFFERENTIAL/PLATELET
Basophils Absolute: 0.1 10*3/uL (ref 0.0–0.1)
Basophils Relative: 1.3 % (ref 0.0–3.0)
EOS ABS: 0.4 10*3/uL (ref 0.0–0.7)
Eosinophils Relative: 7.8 % — ABNORMAL HIGH (ref 0.0–5.0)
HCT: 40.7 % (ref 39.0–52.0)
Hemoglobin: 13.7 g/dL (ref 13.0–17.0)
Lymphocytes Relative: 28.3 % (ref 12.0–46.0)
Lymphs Abs: 1.5 10*3/uL (ref 0.7–4.0)
MCHC: 33.6 g/dL (ref 30.0–36.0)
MCV: 96.4 fl (ref 78.0–100.0)
MONO ABS: 0.6 10*3/uL (ref 0.1–1.0)
Monocytes Relative: 11.7 % (ref 3.0–12.0)
NEUTROS ABS: 2.7 10*3/uL (ref 1.4–7.7)
Neutrophils Relative %: 50.9 % (ref 43.0–77.0)
PLATELETS: 268 10*3/uL (ref 150.0–400.0)
RBC: 4.22 Mil/uL (ref 4.22–5.81)
RDW: 13.6 % (ref 11.5–15.5)
WBC: 5.4 10*3/uL (ref 4.0–10.5)

## 2016-04-16 LAB — LIPID PANEL
CHOL/HDL RATIO: 2
Cholesterol: 184 mg/dL (ref 0–200)
HDL: 80.8 mg/dL (ref >39.00)
LDL Cholesterol: 97 mg/dL (ref 0–99)
NONHDL: 103.09
Triglycerides: 32 mg/dL (ref 0.0–149.0)
VLDL: 6.4 mg/dL (ref 0.0–40.0)

## 2016-04-16 LAB — PSA: PSA: 2.02 ng/mL (ref 0.10–4.00)

## 2016-04-16 LAB — VITAMIN D 25 HYDROXY (VIT D DEFICIENCY, FRACTURES): VITD: 43.58 ng/mL (ref 30.00–100.00)

## 2016-04-16 LAB — TSH: TSH: 2.12 u[IU]/mL (ref 0.35–4.50)

## 2016-04-16 NOTE — Progress Notes (Signed)
Subjective:    Patient ID: Jeff Graves, male    DOB: 1945/11/20, 71 y.o.   MRN: [id number]  HPI Jeff Graves here for afollow up visit... he is followed for HBP, cardiac arrhythmia, & Chol, w/ a past hx of kidney stones & cspine dz...  ~  SEE PREV EPIC NOTES FOR OLDER DATA >>     EKG 12/13 showed SBrady, rate39, NAD, borderline tracing...  LABS 12/13:  FLP- at goals on Lip10;  Chems- wnl;  CBC- wnl;  TSH=1.59;  PSA=1.22;  UA- clear  ~  He had thorough cardiac eval in 2014 by DrMcAlhany & DrKlein w/ EKGs, 2DEcho, GXT, Holter (see below); found to have an atrial tachycardia & responded nicely to Flecainide w/o recurrent tachypalpitations; Cards did decr his Losartan to 50mg  due to some dizziness and all symptoms resolved...   CXR 12/14 showed norm heart size, clear lungs, NAD...  LABS 12/14:  FLP- at goals on Lip10 & HDL=94;  Chems- wnl w/ Ct=1.4;  CBC- wnl;  TSH=2.48;  PSA=1.88...    ~  April 15, 2014:  53mo ROV & Jeff Graves reports another good year overall- he recently had left rotator cuff surg (2/16 by [name]); he is now getting rehab & is improved... We reviewed the following medical problems during today's office visit >>     HBP> on Losar100-1/2 tab; BP= 130/82 w/o postural change; he denies CP, palpit, SOB, edema...    Cardiac arrhythmia> SBrady- Atrial Tachy to 190- PACs; DrKlein placed him on Flecainide 100mg Bid & he is improved...    Chol> on Lip10 daily; FLP 3/16 showed TChol 165, TG 43, HDL 76, LDL 81...    GI- Divertics, Hems> no GI complaints- w/o abd pain, n/v, c/d, blood seen; he had f/u colonoscopy 2/15 by [name]- NEG x sm int hems; he rec repeat colon in 26yrs...    DJD> on Celebrex200 prn; known DDD Cspine w/ mild sp stenosis- treated conservatively & doing satis; he fell w/ left shoulder injury, eval by Ortho & he had rotator cuff surg 2/16- now in rehab & improved...Marland KitchenMarland KitchenMarland Kitchen     Kidney Stones> hx several stones in the past; he passed them all, last 8-5yrs ago; no current  symptoms...    Seborrheic Dermatitis> told by Nanda Quinton that it was a form of Psoriasis & treated w/ a topical cream- improved... We reviewed prob list, meds, xrays and labs> see below for updates >> he had 2015 flu vaccine last Nov; given PREVNAR-13 today...  LABS 3/16:  FLP- wnl on Lip10;  Chems- wnl;  CBC- wnl;  TSH=1.55;  PSA=1.89...  ~  April 17, 2015:  Yearly Jeff Graves says he is doing well, no new complaints or concerns, still working his heating & air job at [employer];  He had a bout of bronchitis treated in Jan2017 by [facility] w/ Doxycycline, CXR was clear;  He saw DrKlein 03/24/15 for f/u HBO & atrial tachy> Myoview was done 12/2014 & showed EF=50% & 2 small reversible defects in the distal LAD & LCx territories- an intermediate risk study; Flecainide was stopped & Amlodipine started, then RANEXA started & follow up is on-going w/ Cards... we reviewed the following medical problems during today's office visit >>     HBP> on ASA81 & Losar100-1/2 tab; BP= 138/78 w/o postural change; he denies CP, palpit, SOB, edema...    Cardiac arrhythmia> SBrady- Atrial Tachy to 190- PACs; DrKlein placed him on Flecainide 100mg Bid => switched to RANEXA 500Bid w/ adjustments on-going.Marland KitchenMarland Kitchen  Chol> on Lip10 daily; [place] 3/17 showed TChol 167, TG 32, HDL 82, LDL 79... Continue same.    GI- Divertics, Hems> no GI complaints- w/o abd pain, n/v, c/d, blood seen; he had f/u colonoscopy 2/15 by [name]- NEG x sm int hems; he rec repeat colon in 74yrs...    DJD> on Celebrex200 prn; known DDD Cspine w/ mild sp stenosis- treated conservatively & doing satis; he fell w/ left shoulder injury, eval by Ortho & he had rotator cuff surg 2/16- now in rehab & improved...Marland KitchenMarland KitchenMarland Kitchen     Kidney Stones> hx several stones in the past; he passed them all, last 8-66yrs ago; no current symptoms...    Seborrheic Dermatitis> told by Nanda Quinton that it was a form of Psoriasis & treated w/ a topical cream- improved... EXAM shows Afeb, VSS,  O2sat=99% on RA;  HEENT- neg, mallampati1;  Chest- clear w/o m/r/g;  Heart- RR w/o m/r/g heard;  Abd- soft, nontender, neg;  Ext- neg w/o c/c/e;  Neuro- intact...  CXR 03/10/15>  Norm heart size, sl hyperinflation, clear- NAD...   LABS 04/17/15>  FLP- at goals on Atorva10;  Chems- wnl, Cr=1.31;  TSH=1.57;  VitD=21;  PSA=1.94 IMP/PLAN>>  Osiel is stable, meds refilled, needs ~2000u VitD daily, cardiac care is on-going w/ DrKlein...   ~  April 16, 2016:  Yearly ROV & medical follow up visit>  Jeff Graves is still working full time in [employer] business;  Feeling well- no new complaints or concerns, good year, no problems ident; he exercises regularly 3d/wk in gym doing cardio etc...     He had CARDS f/u w/ RUrsuy,PA on 05/18/15>  HBP, ATachy, HLD; on Ranexa & doing satis; note reviewed, no changes made, f/u planned 59mo...     Seen 03/26/16 by ENT-DrBates>  Left ear cerumen impaction and canal cholesteatoma w/ conductive hearing loss; this was removed during the Ov & asked to f/u prn... We reviewed the following medical problems during today's office visit >>     Hx left ear cerumen & canal cholesteatoma>  Managed & removed as required by [facility] for ENT...    HBP> on ASA81 & Losar100-1/2 tab; BP= 110/72 w/o postural change; he denies CP, palpit, SOB, edema...    Cardiac arrhythmia> SBrady- Atrial Tachy to 190- PACs; DrKlein placed him on Flecainide 100mg Bid => switched to RANEXA 500Bid w/ adjustments on-going...    Chol> on Lip10 daily; FLP 3/18 showed TChol 184, TG 32, HDL 81, LDL 97... Continue same.    GI- Divertics, Hems> no GI complaints- w/o abd pain, n/v, c/d, blood seen; he had f/u colonoscopy 2/15 by [name]- NEG x sm int hems; he rec repeat colon in 78yrs...    DJD> prev on Celebrex200 prn, now just OTC meds; known DDD Cspine w/ mild sp stenosis- treated conservatively & doing satis; hx left shoulder injury, eval by Ortho & he had rotator cuff surg 2/16- had rehab & improved...Marland KitchenMarland KitchenMarland Kitchen     Kidney  Stones> hx several stones in the past; he passed them all, last ~72yrs ago; no current symptoms...    Seborrheic Dermatitis> told by Nanda Quinton that it was a form of Psoriasis & treated w/ a topical cream- improved... EXAM shows Afeb, VSS, O2sat=99% on RA;  HEENT- neg, mallampati1;  Chest- clear w/o m/r/g;  Heart- RR w/o m/r/g heard;  Abd- soft, nontender, neg;  Ext- neg w/o c/c/e;  Neuro- intact...  LABS 04/16/16>  FLP at goals on Atorva10;  Chems- wnl;  CBC- wnl;  TSH=2.12;  PSA=2.02;  VitD=44 IMP/PLAN>>  Jeff Graves continues to do well- great job w/ diet 7 exercise, continue same meds, call for any problems...            Problem List:    HYPERTENSION (ICD-401.9) - controlled on LOSARTAN Rx... ~  12/10: notes sl postural dizziness on & off so we decided to change Hyzaar to LOSARTAN 100mg /d. ~  12/11:  doing very well on the Losartan 100mg /d- BP 110/70 & similar at home;  CXR is clear, WNL;  EKG shows sinus brady, NAD... ~  12/12:  BP= 120/68 & he remains asymptomatic;  CXR remains clear;  EKG shows SBrady rate46 otherw wnl... ~  12/13: on Losar100; BP= 118/68 w/o postural change; he denies CP, palpit, SOB, edema; but notes the intermittent dizziness w/ strenuous activity- refer to Cards. ~  12/14: on Losar100-1/2 tab; BP= 110/60 w/o postural change; he denies CP, palpit, SOB, edema...  Dizziness, Tachy-Brady> he has marked sinus brady & not on BBlockers, etc; notes some dizziness w/ strenuous activ but no syncope; suggest Cardiac eval w/ treadmill & holter for further eval...  ~  EKG 12/13 showed SBrady, rate39, NAD, borderline tracing... ~  2DEcho 2/14 showed norm LV size 7 function, EF=55-60%, Gr1DD, mildly thickened leaflets, mildAI, mildMR, modPR, PAsys=33... ~  GXT 2/14 showed exercise to 11 mets, no ischemia, no angina, NSSTTWA... ~  Holter monitor 3/14 showed atrial tachy to 190, PACs, NSR & SBrady ~  4/14: Cards decided on a trial of Flecainide- 100mg Bid w/ resolution of his  tachypalpit... ~  EKG 6/14 by DrKlein showed SBrady, rate49, otherw wnl...  ~  12/14: SBrady- Atrial Tachy to 190- PACs; DrKlein placed him on Flecainide 100mg Bid & he is improved ~  3/16:  He remains stable on Flecainide100Bid; last saw DrKlein 9/15- noted some atypCP but felt to be stable... ~  11/16:  Myoview 12/2014 showed EF=50% & 2 small reversible defects in the distal LAD & LCx territories- an intermediate risk study  HYPERCHOLESTEROLEMIA (ICD-272.0) - controlled on diet and LIPITOR 10mg /d... ~  Jeff Graves 11/07 showed TChol 147, TG 24, HDL 61, LDL 81... ~  Jeff Graves 12/08 showed TChol 167, TG 25, HDL 69, LDL 93... continue same Rx. ~  FLP 12.09 showed TChol 163, TG 20, HDL 66. LDL 93... rec- same. ~  FLP 12/10 showed TChol 161, TG 20, HDL 83, LDL 74 ~  FLP 12/11 on Lip10 showed TChol 155, TG 24, HDL 66, LDL 84 ~  FLP 12/12 on Lip10 showed TChol 166, TG 17, HDL 74, LDL 89 ~  FLP 12/13 on Lip10 showed TChol 161, TG 27, HDL 77, LDL 79 ~  FLP 12/14 on Lip10 showed TChol 183, TG 11, HDL 94, LDL 87 ~  FLP 3/16 on Lip10 showed TChol 165, TG 43, HDL 76, LDL 81 ~  FLP 3/17 on Atorva10 showed TChol 167, TG 32, HDL 82, LDL 79  DIVERTICULITIS, COLON (ICD-562.11) & HEMORRHOIDS (ICD-455.6) >>  ~  last colonoscopy 11/04 by [name] w/ divertics & int hems, f/u planned 52yrs per GI protocol... ~  he had f/u colonoscopy 2/15 by [name]- NEG x sm int hems; he rec repeat colon in 63yrs...  DEGENERATIVE DISC DISEASE, CERVICAL SPINE (ICD-722.4) & SHOULDER PAIN (ICD-719.41) - eval 6/97 by Marciano Sequin w/ MRI showing DDD & mild sp stenosis w/o nerve root impingement - Rx conservatively;  increased pain in 2/06 w/ eval DrElsner and MRI showing spondylosis at 3 levels, again Rx conserv...  he uses CELEBREX 200mg  Prn  for pain... ~  2/16: he fell w/ left shoulder pain & eval by [name]- he had left rotator cuff surg 2/16, followed by rehab, improved...  RENAL CALCULUS, HX OF (ICD-V13.01) - he's had 3 stones in the past and  managed to pass all of them... last about 6 years ago... no prob currently.  VITAMIN D Deficiency >> Vit D level 3/17 = 21 & rec to start VitD 2000u daily...  RASH >> looked like seb dermatitis to me but Derm told him it was a form of Psoriasis & Rx by DrAJordan w/ topical cream==> improved...  Health Maint - on ASA 81mg /d...  ~  GI:  Followed by [name] & he is up to date w/ colon done 2/15- neg... ~  GU:  His DRE is neg/ wnl & PSAs have been wnl as well... ~  Immuniz:  He gets the yearly flu vaccines;  He had TDAP 12/13;  He had remote Pneumovax 2003;  Given PREVNAR-13 3/16...   Past Surgical History:  Procedure Laterality Date  . COLONOSCOPY    . None    . SHOULDER SURGERY Left 03/29/2014     Outpatient Encounter Prescriptions as of 04/16/2016  Medication Sig Dispense Refill  . aspirin EC 81 MG tablet Take 1 tablet (81 mg total) by mouth daily.    [name] atorvastatin (LIPITOR) 10 MG tablet TAKE 1 TABLET (10 MG TOTAL) BY MOUTH DAILY. 30 tablet 11  . Cholecalciferol (VITAMIN D) 2000 units CAPS Take 2,000 Units by mouth daily.    [name] loratadine (CLARITIN) 10 MG tablet Take 10 mg by mouth daily as needed.     [name] losartan (COZAAR) 100 MG tablet TAKE 1/2 TABLET (100 MG TOTAL) BY MOUTH DAILY. 30 tablet 11  . promethazine (PHENERGAN) 12.5 MG tablet 1-2 every up to every 4 h prn nausea 30 tablet 0  . RANEXA 500 MG 12 hr tablet TAKE 1 TABLET BY MOUTH TWICE DAILY 180 tablet 3   No facility-administered encounter medications on file as of 04/16/2016.     No Known Allergies   Current Medications, Allergies, Past Medical History, Past Surgical History, Family History, and Social History were reviewed in [employer] record.    Review of Systems    The patient denies fever, chills, sweats, anorexia, fatigue, weakness, malaise, weight loss, sleep disorder, blurring, diplopia, eye irritation, eye discharge, vision loss, eye pain, photophobia, earache, ear discharge, tinnitus,  decreased hearing, nasal congestion, nosebleeds, sore throat, hoarseness, chest pain, palpitations, syncope, dyspnea on exertion, orthopnea, PND, peripheral edema, cough, dyspnea at rest, excessive sputum, hemoptysis, wheezing, pleurisy, nausea, vomiting, diarrhea, constipation, change in bowel habits, abdominal pain, melena, hematochezia, jaundice, gas/bloating, indigestion/heartburn, dysphagia, odynophagia, dysuria, hematuria, urinary frequency, urinary hesitancy, nocturia, incontinence, back pain, joint pain, joint swelling, muscle cramps, muscle weakness, stiffness, arthritis, sciatica, restless legs, leg pain at night, leg pain with exertion, rash, itching, dryness, suspicious lesions, paralysis, paresthesias, seizures, tremors, vertigo, transient blindness, frequent falls, frequent headaches, difficulty walking, depression, anxiety, memory loss, confusion, cold intolerance, heat intolerance, polydipsia, polyphagia, polyuria, unusual weight change, abnormal bruising, bleeding, enlarged lymph nodes, urticaria, allergic rash, hay fever, and recurrent infections.     Objective:   Physical Exam    WD, WN, 71 y/o Graves in NAD... GENERAL:  Alert & oriented; pleasant & cooperative... HEENT:  /AT, EOM-wnl, PERRLA, EACs-clear, TMs-wnl, NOSE-clear, THROAT-clear & wnl. NECK:  Supple w/ fairROM; no JVD; normal carotid impulses w/o bruits; no thyromegaly or nodules palpated; no lymphadenopathy. CHEST:  Clear to  P & A; without wheezes/ rales/ or rhonchi. HEART:  Regular Rhythm; without murmurs/ rubs/ or gallops. ABDOMEN:  Soft & nontender; normal bowel sounds; no organomegaly or masses detected. RECTAL:  Neg - prostate 2+ & nontender w/o nodules; stool hematest neg. EXT: without deformities or arthritic changes; no varicose veins/ venous insuffic/ or edema. NEURO:  CN's intact; motor testing normal; sensory testing normal; gait normal & balance OK. DERM:  Improved seborrheic dermatitis in scalp (dandruff),  some on face, and arms...  RADIOLOGY DATA:  Reviewed in the EPIC EMR & discussed w/ the patient...  LABORATORY DATA:  Reviewed in the EPIC EMR & discussed w/ the patient...   Assessment & Plan:    CPX 04/16/16 >> Jeff Graves continues to do well- great job w/ diet 7 exercise, continue same meds, call for any problems   HBP>  Controlled on Losartan50; continue same...  Cardiac Arrhythmia>  he has marked sinus brady & not on BBlockers, etc; noted some dizziness w/ strenuous activ but no syncope; referred to Cards w/ full eval [facility] & DrKlein- Holter w/ SVT & treated w/ Tambocor100Bid & improved... ~  Abnormal Myoview as noted... ~  On-going medication adjustments by DrKlein...  CHOL>  FLP looks great on Lip10...   GI> Divertics, Hems>  Stable, asymptomatic, his follow up colon is due soon & he will call GI to set this up...  Hx Kidney Stones>  Stable, no recurrence...  DJD> CSpine, Shoulder pain>  He uses Celebrex as needed...  Vit D Deficiency>  Vit D level Mar2017 = 21 & started on VitD 2000u daily...  Other medical problems as noted...   Patient's Medications  New Prescriptions   No medications on file  Previous Medications   ASPIRIN EC 81 MG TABLET    Take 1 tablet (81 mg total) by mouth daily.   ATORVASTATIN (LIPITOR) 10 MG TABLET    TAKE 1 TABLET (10 MG TOTAL) BY MOUTH DAILY.   CHOLECALCIFEROL (VITAMIN D) 2000 UNITS CAPS    Take 2,000 Units by mouth daily.   LORATADINE (CLARITIN) 10 MG TABLET    Take 10 mg by mouth daily as needed.    LOSARTAN (COZAAR) 100 MG TABLET    TAKE 1/2 TABLET (100 MG TOTAL) BY MOUTH DAILY.   PROMETHAZINE (PHENERGAN) 12.5 MG TABLET    1-2 every up to every 4 h prn nausea   RANEXA 500 MG 12 HR TABLET    TAKE 1 TABLET BY MOUTH TWICE DAILY  Modified Medications   No medications on file  Discontinued Medications   No medications on file

## 2016-04-16 NOTE — Patient Instructions (Signed)
Today we updated your med list in our EPIC system...    Continue your current medications the same...    We refilled your meds per request...  Today we did your follow up FASTING blood work...    We will contact you w/ the results when available...   We also gave you the last of the  PNEUMONIA vaccinations-- Pneumovax-23 today...  Keep up the good work w/ your exercise program...  Call for any questions...  Let's plan a follow up visit in 16yr, sooner if needed for problems.Marland KitchenMarland Kitchen

## 2016-05-14 ENCOUNTER — Other Ambulatory Visit: Payer: Self-pay | Admitting: Pulmonary Disease

## 2016-05-14 DIAGNOSIS — H524 Presbyopia: Secondary | ICD-10-CM | POA: Diagnosis not present

## 2016-06-13 ENCOUNTER — Encounter: Payer: Self-pay | Admitting: Internal Medicine

## 2016-06-13 ENCOUNTER — Ambulatory Visit (INDEPENDENT_AMBULATORY_CARE_PROVIDER_SITE_OTHER): Payer: PPO | Admitting: Internal Medicine

## 2016-06-13 VITALS — BP 118/72 | HR 63 | Ht 69.0 in | Wt 165.0 lb

## 2016-06-13 DIAGNOSIS — I471 Supraventricular tachycardia: Secondary | ICD-10-CM

## 2016-06-13 DIAGNOSIS — R001 Bradycardia, unspecified: Secondary | ICD-10-CM

## 2016-06-13 NOTE — Progress Notes (Signed)
Patient Care Team: Noralee Space, MD as PCP - General (Pulmonary Disease)   HPI  Jeff Graves is a 71 y.o. male  Seen in followup for atrial tachycardia for which he took flecainide   He also has a history of hypertension  Stress testing had demonstrated normal blood pressure response normal heart rate response to exercise; during the summer he is also noted episodes of short lasting chest discomfort  . It was unassociated with exertion.it lasted about a minute. Because of this we undertook Myoview scanning which is abnormal.  >>>>Nuclear stress EF: 50%.  There was no ST segment deviation noted during stress.  Defect 1: There is a small defect of mild severity present in the basal inferolateral and mid inferolateral location.  Defect 2: There is a small defect of moderate severity present in the apex location.  Findings consistent with ischemia.  This is an intermediate risk study.  The left ventricular ejection fraction is mildly decreased (45-54%).  This prompted [place] to stop his flecainide. We added amlodipine as a calcium blocker in the hopes that we would have antiarrhythmic benefit.  This was an adequate. We discussed next the use of ranolazine versus dronaderone. The former was initiated and he was seen 2 months later-4/17 doing quite well.   His biggest complaint is episodes of lightheadedness. These are more common in the summer when he is working outside. They're characterized by no prodrome but by recovery phase of profound fatigue with some residual orthostatic intolerance. He's been noted by his wife to be extremely pale. There is neither nausea nor diaphoresis. They're not associated with palpitations.   Indeed on the ranolazine he has had few palpitations.  Past Medical History:  Diagnosis Date  . Allergy    seasonal  . Atrial tachycardia ([place]) 06/04/2012   12/13-QRS duration-102 ms 6/14-QRS duration-98 ms   . DDD (degenerative disc disease)   .  Diverticulosis of colon   . Hemorrhoids   . Hypercholesterolemia   . Hypertension   . PAC (premature atrial contraction)   . Renal calculus   . Shoulder pain   . Sinus bradycardia     Past Surgical History:  Procedure Laterality Date  . COLONOSCOPY    . None    . SHOULDER SURGERY Left 03/29/2014    Current Outpatient Prescriptions  Medication Sig Dispense Refill  . aspirin EC 81 MG tablet Take 1 tablet (81 mg total) by mouth daily.    [name] atorvastatin (LIPITOR) 10 MG tablet TAKE 1 TABLET (10 MG TOTAL) BY MOUTH DAILY. 90 tablet 10  . Cholecalciferol (VITAMIN D) 2000 units CAPS Take 2,000 Units by mouth daily.    [name] loratadine (CLARITIN) 10 MG tablet Take 10 mg by mouth daily as needed.     [name] losartan (COZAAR) 100 MG tablet TAKE ONE-HALF TABLET (50MG  TOTAL) BY MOUTH DAILY 45 tablet 9  . promethazine (PHENERGAN) 12.5 MG tablet 1-2 every up to every 4 h prn nausea 30 tablet 0  . RANEXA 500 MG 12 hr tablet TAKE 1 TABLET BY MOUTH TWICE DAILY 180 tablet 3   No current facility-administered medications for this visit.     No Known Allergies  Review of Systems negative except from HPI and PMH  Physical Exam BP 118/72   Pulse 63   Ht 5\' 9"  (1.753 m)   Wt 165 lb (74.8 kg)   SpO2 97%   BMI 24.37 kg/m  Well developed and well nourished in no acute distress HENT  normal E scleral and icterus clear Neck Supple JVP flat; carotids brisk and full Clear to ausculation Regular rate and rhythm, soft S1  no murmurs gallops or rub Soft with active bowel sounds No clubbing cyanosis none Edema Alert and oriented, grossly normal motor and sensory function Skin Warm and Dry  ECG today demonstrated sinus rhythm at 57 Intervals 18/09/41 Otherwise normal  Assessment and  Plan    Atrial tachycardia-flecainide stopped  Lightheadedness likely neurally mediated   Sinus bradycardia  Hypertension  BP well controlled  Sinus bradycardia has not been so much of an issue exercise tolerance  is better. We will continue him on ranolazine.[name]  His lightheadedness is quite profound. We have discussed mechanisms and I suspect that this is neurally mediated. The issue is triggering as he has little warning in terms of trying to prevent spells. Contextualy, they seemed to be related to working outside in the summer-- and so we discussed the importance of maintaining hydration and taking breaks. We have also discussed the potential atrial tachycardia is a trigger and hence, an implantable loop recorder may be of benefit to try to elucidate that. Even the lack of prodrome, his risks are related to the event and when ever we can do identify and mitigate the triggering offers [place] our best therapeutic options  We spent more than 50% of our >25 min visit in face to face counseling regarding the above

## 2016-06-13 NOTE — Patient Instructions (Signed)
Medication Instructions: - Your physician recommends that you continue on your current medications as directed. Please refer to the Current Medication list given to you today.  Labwork: - none ordered  Procedures/Testing: - none ordered  Follow-Up: - Your physician recommends that you schedule a follow-up appointment in: 8 weeks with Chanetta Marshall, NP  - Your physician wants you to follow-up in: 1 year with Dr. Caryl Comes.  You will receive a reminder letter in the mail two months in advance. If you don't receive a letter, please call our office to schedule the follow-up appointment.   Any Additional Special Instructions Will Be Listed Below (If Applicable).     If you need a refill on your cardiac medications before your next appointment, please call your pharmacy.

## 2016-06-24 NOTE — Addendum Note (Signed)
Addended by: Campbell Riches on: 06/24/2016 07:08 AM   Modules accepted: Orders

## 2016-07-12 ENCOUNTER — Other Ambulatory Visit: Payer: Self-pay | Admitting: *Deleted

## 2016-07-12 MED ORDER — RANOLAZINE ER 500 MG PO TB12: 500.0000 mg | ORAL_TABLET | Freq: Two times a day (BID) | ORAL | 3 refills | Status: DC

## 2016-07-16 ENCOUNTER — Ambulatory Visit: Payer: PPO | Admitting: Internal Medicine

## 2016-08-04 NOTE — Progress Notes (Deleted)
Electrophysiology Office Note Date: 08/04/2016  ID:  Jeff Graves, DOB [birth year], MRN [id number]  PCP: Noralee Space, MD Primary Cardiologist: *** Electrophysiologist: ***  CC: ***  Jeff Graves is a 71 y.o. male seen today for ***.  {He/she (caps):30048} presents today for routine electrophysiology followup.  Since last being seen in our clinic, the patient reports doing very well.  {He/she (caps):30048} denies chest pain, palpitations, dyspnea, PND, orthopnea, nausea, vomiting, dizziness, syncope, edema, weight gain, or early satiety.  Past Medical History:  Diagnosis Date  . Allergy    seasonal  . Atrial tachycardia ([place]) 06/04/2012   12/13-QRS duration-102 ms 6/14-QRS duration-98 ms   . DDD (degenerative disc disease)   . Diverticulosis of colon   . Hemorrhoids   . Hypercholesterolemia   . Hypertension   . PAC (premature atrial contraction)   . Renal calculus   . Shoulder pain   . Sinus bradycardia    Past Surgical History:  Procedure Laterality Date  . COLONOSCOPY    . None    . SHOULDER SURGERY Left 03/29/2014    Current Outpatient Prescriptions  Medication Sig Dispense Refill  . aspirin EC 81 MG tablet Take 1 tablet (81 mg total) by mouth daily.    [name] atorvastatin (LIPITOR) 10 MG tablet TAKE 1 TABLET (10 MG TOTAL) BY MOUTH DAILY. 90 tablet 10  . Cholecalciferol (VITAMIN D) 2000 units CAPS Take 2,000 Units by mouth daily.    [name] loratadine (CLARITIN) 10 MG tablet Take 10 mg by mouth daily as needed.     [name] losartan (COZAAR) 100 MG tablet TAKE ONE-HALF TABLET (50MG  TOTAL) BY MOUTH DAILY 45 tablet 9  . promethazine (PHENERGAN) 12.5 MG tablet 1-2 every up to every 4 h prn nausea 30 tablet 0  . ranolazine (RANEXA) 500 MG 12 hr tablet Take 1 tablet (500 mg total) by mouth 2 (two) times daily. 180 tablet 3   No current facility-administered medications for this visit.     Allergies:   Patient has no known allergies.   Social History: Social History    Social History  . Marital status: Married    Spouse name: brenda  . Number of children: 1  . Years of education: N/A   Occupational History  . 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 Smoking status: Former Smoker    Packs/day: 0.50    Years: 20.00    Types: Cigarettes    Quit date: 02/12/1984  . Smokeless tobacco: Never Used  . Alcohol use No     Comment: social use  . Drug use: No  . Sexual activity: Not on file   Other Topics Concern  . Not on file   Social History Narrative  . No narrative on file    Family History: Family History  Problem Relation Age of Onset  . Heart attack Father 35  . Valvular heart disease Mother 76  . Lymphoma Sister        Deceased  . Breast cancer Sister        Alive  . Colon cancer Neg Hx   . Esophageal cancer Neg Hx   . Rectal cancer Neg Hx   . Stomach cancer Neg Hx     Review of Systems: General: No chills, fever, night sweats or weight changes  Cardiovascular:  No chest pain, dyspnea on exertion, edema, orthopnea, palpitations, paroxysmal nocturnal dyspnea  Dermatological: No rash, lesions or masses  Respiratory: No cough, dyspnea Urologic: No hematuria, dysuria Abdominal: No nausea, vomiting, diarrhea, bright red blood per rectum, melena, or hematemesis Neurologic: No visual changes, weakness, changes in mental status All other systems reviewed and are otherwise negative except as noted above.   Physical Exam: VS:  There were no vitals taken for this visit. , BMI There is no height or weight on file to calculate BMI. Wt Readings from Last 3 Encounters:  06/13/16 165 lb (74.8 kg)  04/16/16 163 lb 4 oz (74 kg)  05/18/15 164 lb (74.4 kg)    GEN- The patient is well appearing, alert and oriented x 3 today.   HEENT: normocephalic, atraumatic; sclera clear, conjunctiva pink; hearing intact; oropharynx clear; neck supple, no JVP Lymph- no cervical lymphadenopathy Lungs- Clear to ausculation  bilaterally, normal work of breathing.  No wheezes, rales, rhonchi Heart- Regular rate and rhythm, no murmurs, rubs or gallops, PMI not laterally displaced GI- soft, non-tender, non-distended, bowel sounds present, no hepatosplenomegaly Extremities- no clubbing, cyanosis, or edema; DP/PT/radial pulses 2+ bilaterally MS- no significant deformity or atrophy Skin- warm and dry, no rash or lesion  Psych- euthymic mood, full affect Neuro- strength and sensation are intact   EKG:  EKG {ACTION; IS/IS BEM:[id number]} ordered today. The ekg ordered today shows ***  Recent Labs: 04/16/2016: ALT 14; BUN 14; Creatinine, Ser 1.32; Hemoglobin 13.7; Platelets 268.0; Potassium 4.4; Sodium 136; TSH 2.12    Other studies Reviewed: Additional studies/ records that were reviewed today include: ***  Review of the above records today demonstrates: ***  Assessment and Plan:  ***   Current medicines are reviewed at length with the patient today.   The patient {ACTIONS; HAS/DOES NOT HAVE:19233} concerns regarding his medicines.  The following changes were made today:  {NONE DEFAULTED:18576::"none"}  Labs/ tests ordered today include: *** No orders of the defined types were placed in this encounter.    Disposition:   Follow up with *** {gen number [id number]} {TIME; UNITS DAY/WEEK/MONTH:19136}   Signed, Chanetta Marshall, NP 08/04/2016 7:34 PM   [place] [address] [phone] (office) [phone] (fax

## 2016-08-08 ENCOUNTER — Ambulatory Visit: Payer: PPO | Admitting: Nurse Practitioner

## 2017-04-16 ENCOUNTER — Ambulatory Visit: Payer: PPO | Admitting: Pulmonary Disease

## 2017-04-16 ENCOUNTER — Other Ambulatory Visit (INDEPENDENT_AMBULATORY_CARE_PROVIDER_SITE_OTHER): Payer: PPO

## 2017-04-16 ENCOUNTER — Encounter: Payer: Self-pay | Admitting: Pulmonary Disease

## 2017-04-16 ENCOUNTER — Ambulatory Visit (INDEPENDENT_AMBULATORY_CARE_PROVIDER_SITE_OTHER)
Admission: RE | Admit: 2017-04-16 | Discharge: 2017-04-16 | Disposition: A | Discharge status: Home / Self Care | Payer: PPO | Source: Ambulatory Visit | Attending: Pulmonary Disease | Admitting: Pulmonary Disease

## 2017-04-16 VITALS — BP 118/60 | HR 53 | Temp 97.6°F | Ht 69.0 in | Wt 161.2 lb

## 2017-04-16 DIAGNOSIS — I10 Essential (primary) hypertension: Secondary | ICD-10-CM

## 2017-04-16 DIAGNOSIS — I471 Supraventricular tachycardia: Secondary | ICD-10-CM

## 2017-04-16 DIAGNOSIS — E7800 Pure hypercholesterolemia, unspecified: Secondary | ICD-10-CM

## 2017-04-16 DIAGNOSIS — N138 Other obstructive and reflux uropathy: Secondary | ICD-10-CM

## 2017-04-16 DIAGNOSIS — K5730 Diverticulosis of large intestine without perforation or abscess without bleeding: Secondary | ICD-10-CM

## 2017-04-16 DIAGNOSIS — N401 Enlarged prostate with lower urinary tract symptoms: Secondary | ICD-10-CM

## 2017-04-16 DIAGNOSIS — Z9889 Other specified postprocedural states: Secondary | ICD-10-CM

## 2017-04-16 DIAGNOSIS — Z0000 Encounter for general adult medical examination without abnormal findings: Secondary | ICD-10-CM | POA: Diagnosis not present

## 2017-04-16 DIAGNOSIS — M5010 Cervical disc disorder with radiculopathy, unspecified cervical region: Secondary | ICD-10-CM

## 2017-04-16 DIAGNOSIS — Z87442 Personal history of urinary calculi: Secondary | ICD-10-CM

## 2017-04-16 LAB — CBC WITH DIFFERENTIAL/PLATELET
Basophils Absolute: 0.1 10*3/uL (ref 0.0–0.1)
Basophils Relative: 0.7 % (ref 0.0–3.0)
EOS PCT: 5.3 % — AB (ref 0.0–5.0)
Eosinophils Absolute: 0.4 10*3/uL (ref 0.0–0.7)
HCT: 40.6 % (ref 39.0–52.0)
Hemoglobin: 13.7 g/dL (ref 13.0–17.0)
LYMPHS ABS: 1.6 10*3/uL (ref 0.7–4.0)
Lymphocytes Relative: 20.4 % (ref 12.0–46.0)
MCHC: 33.8 g/dL (ref 30.0–36.0)
MCV: 95.9 fl (ref 78.0–100.0)
MONOS PCT: 8.6 % (ref 3.0–12.0)
Monocytes Absolute: 0.7 10*3/uL (ref 0.1–1.0)
NEUTROS PCT: 65 % (ref 43.0–77.0)
Neutro Abs: 5 10*3/uL (ref 1.4–7.7)
PLATELETS: 298 10*3/uL (ref 150.0–400.0)
RBC: 4.24 Mil/uL (ref 4.22–5.81)
RDW: 13.1 % (ref 11.5–15.5)
WBC: 7.6 10*3/uL (ref 4.0–10.5)

## 2017-04-16 LAB — LIPID PANEL
Cholesterol: 163 mg/dL (ref 0–200)
HDL: 86.9 mg/dL (ref >39.00)
LDL Cholesterol: 70 mg/dL (ref 0–99)
NonHDL: 75.88
Total CHOL/HDL Ratio: 2
Triglycerides: 30 mg/dL (ref 0.0–149.0)
VLDL: 6 mg/dL (ref 0.0–40.0)

## 2017-04-16 LAB — COMPREHENSIVE METABOLIC PANEL
ALBUMIN: 4.1 g/dL (ref 3.5–5.2)
ALK PHOS: 63 U/L (ref 39–117)
ALT: 14 U/L (ref 0–53)
AST: 15 U/L (ref 0–37)
BUN: 17 mg/dL (ref 6–23)
CO2: 29 mEq/L (ref 19–32)
Calcium: 9.6 mg/dL (ref 8.4–10.5)
Chloride: 99 mEq/L (ref 96–112)
Creatinine, Ser: 1.28 mg/dL (ref 0.40–1.50)
GFR: 59.03 mL/min — AB (ref >60.00)
GLUCOSE: 85 mg/dL (ref 70–99)
POTASSIUM: 4.3 meq/L (ref 3.5–5.1)
Sodium: 134 mEq/L — ABNORMAL LOW (ref 135–145)
TOTAL PROTEIN: 6.8 g/dL (ref 6.0–8.3)
Total Bilirubin: 0.9 mg/dL (ref 0.2–1.2)

## 2017-04-16 LAB — TSH: TSH: 3.22 u[IU]/mL (ref 0.35–4.50)

## 2017-04-16 LAB — PSA: PSA: 1.99 ng/mL (ref 0.10–4.00)

## 2017-04-16 MED ORDER — LISINOPRIL 10 MG PO TABS: 10.0000 mg | ORAL_TABLET | Freq: Every day | ORAL | 0 refills | Status: DC

## 2017-04-16 NOTE — Progress Notes (Signed)
Subjective:    Patient ID: Jeff Graves, male    DOB: 05/11/1945, 72 y.o.   MRN: [id number]  HPI 72 y/o WM here for afollow up visit... he is followed for HBP, cardiac arrhythmia, & Chol, w/ a past hx of kidney stones & cspine dz...  ~  SEE PREV EPIC NOTES FOR OLDER DATA >>     EKG 12/13 showed SBrady, rate39, NAD, borderline tracing...  LABS 12/13:  FLP- at goals on Lip10;  Chems- wnl;  CBC- wnl;  TSH=1.59;  PSA=1.22;  UA- clear  ~  He had thorough cardiac eval in 2014 by DrMcAlhany & DrKlein w/ EKGs, 2DEcho, GXT, Holter (see below); found to have an atrial tachycardia & responded nicely to Flecainide w/o recurrent tachypalpitations; Cards did decr his Losartan to 50mg  due to some dizziness and all symptoms resolved...   CXR 12/14 showed norm heart size, clear lungs, NAD...  LABS 12/14:  FLP- at goals on Lip10 & HDL=94;  Chems- wnl w/ Ct=1.4;  CBC- wnl;  TSH=2.48;  PSA=1.88...    ~  April 15, 2014:  70mo ROV & Jeff Graves reports another good year overall- he recently had left rotator cuff surg (2/16 by [name]); he is now getting rehab & is improved... We reviewed the following medical problems during today's office visit >>     HBP> on Losar100-1/2 tab; BP= 130/82 w/o postural change; he denies CP, palpit, SOB, edema...    Cardiac arrhythmia> SBrady- Atrial Tachy to 190- PACs; DrKlein placed him on Flecainide 100mg Bid & he is improved...    Chol> on Lip10 daily; FLP 3/16 showed TChol 165, TG 43, HDL 76, LDL 81...    GI- Divertics, Hems> no GI complaints- w/o abd pain, n/v, c/d, blood seen; he had f/u colonoscopy 2/15 by [name]- NEG x sm int hems; he rec repeat colon in 55yrs...    DJD> on Celebrex200 prn; known DDD Cspine w/ mild sp stenosis- treated conservatively & doing satis; he fell w/ left shoulder injury, eval by Ortho & he had rotator cuff surg 2/16- now in rehab & improved...Marland KitchenMarland KitchenMarland Kitchen     Kidney Stones> hx several stones in the past; he passed them all, last 8-68yrs ago; no current  symptoms...    Seborrheic Dermatitis> told by Nanda Quinton that it was a form of Psoriasis & treated w/ a topical cream- improved... We reviewed prob list, meds, xrays and labs> see below for updates >> he had 2015 flu vaccine last Nov; given PREVNAR-13 today...  LABS 3/16:  FLP- wnl on Lip10;  Chems- wnl;  CBC- wnl;  TSH=1.55;  PSA=1.89...  ~  April 17, 2015:  Yearly Jeff Graves says he is doing well, no new complaints or concerns, still working his heating & air job at 52;  He had a bout of bronchitis treated in Jan2017 by [facility] w/ Doxycycline, CXR was clear;  He saw DrKlein 03/24/15 for f/u HBO & atrial tachy> Myoview was done 12/2014 & showed EF=50% & 2 small reversible defects in the distal LAD & LCx territories- an intermediate risk study; Flecainide was stopped & Amlodipine started, then RANEXA started & follow up is on-going w/ Cards... we reviewed the following medical problems during today's office visit >>     HBP> on ASA81 & Losar100-1/2 tab; BP= 138/78 w/o postural change; he denies CP, palpit, SOB, edema...    Cardiac arrhythmia> SBrady- Atrial Tachy to 190- PACs; DrKlein placed him on Flecainide 100mg Bid => switched to RANEXA 500Bid w/ adjustments on-going.Marland KitchenMarland Kitchen  Chol> on Lip10 daily; [place] 3/17 showed TChol 167, TG 32, HDL 82, LDL 79... Continue same.    GI- Divertics, Hems> no GI complaints- w/o abd pain, n/v, c/d, blood seen; he had f/u colonoscopy 2/15 by [name]- NEG x sm int hems; he rec repeat colon in 26yrs...    DJD> on Celebrex200 prn; known DDD Cspine w/ mild sp stenosis- treated conservatively & doing satis; he fell w/ left shoulder injury, eval by Ortho & he had rotator cuff surg 2/16- now in rehab & improved...Marland KitchenMarland KitchenMarland Kitchen     Kidney Stones> hx several stones in the past; he passed them all, last 8-63yrs ago; no current symptoms...    Seborrheic Dermatitis> told by Nanda Quinton that it was a form of Psoriasis & treated w/ a topical cream- improved... EXAM shows Afeb, VSS,  O2sat=99% on RA;  HEENT- neg, mallampati1;  Chest- clear w/o m/r/g;  Heart- RR w/o m/r/g heard;  Abd- soft, nontender, neg;  Ext- neg w/o c/c/e;  Neuro- intact...  CXR 03/10/15>  Norm heart size, sl hyperinflation, clear- NAD...   LABS 04/17/15>  FLP- at goals on Atorva10;  Chems- wnl, Cr=1.31;  TSH=1.57;  VitD=21;  PSA=1.94 IMP/PLAN>>  Jeff Graves is stable, meds refilled, needs ~2000u VitD daily, cardiac care is on-going w/ DrKlein...  ~  April 16, 2016:  Yearly ROV & medical follow up visit>  Jeff Graves is still working full time in [employer] business;  Feeling well- no new complaints or concerns, good year, no problems ident; he exercises regularly 3d/wk in gym doing cardio etc...     He had CARDS f/u w/ RUrsuy,PA on 05/18/15>  HBP, ATachy, HLD; on Ranexa & doing satis; note reviewed, no changes made, f/u planned 78mo...     Seen 03/26/16 by ENT-DrBates>  Left ear cerumen impaction and canal cholesteatoma w/ conductive hearing loss; this was removed during the Ov & asked to f/u prn... We reviewed the following medical problems during today's office visit >>     Hx left ear cerumen & canal cholesteatoma>  Managed & removed as required by [facility] for ENT...    HBP> on ASA81 & Losar100-1/2 tab; BP= 110/72 w/o postural change; he denies CP, palpit, SOB, edema...    Cardiac arrhythmia> SBrady- Atrial Tachy to 190- PACs; DrKlein placed him on Flecainide 100mg Bid => switched to RANEXA 500Bid w/ adjustments on-going...    Chol> on Lip10 daily; FLP 3/18 showed TChol 184, TG 32, HDL 81, LDL 97... Continue same.    GI- Divertics, Hems> no GI complaints- w/o abd pain, n/v, c/d, blood seen; he had f/u colonoscopy 2/15 by [name]- NEG x sm int hems; he rec repeat colon in 14yrs...    DJD> prev on Celebrex200 prn, now just OTC meds; known DDD Cspine w/ mild sp stenosis- treated conservatively & doing satis; hx left shoulder injury, eval by Ortho & he had rotator cuff surg 2/16- had rehab & improved...Marland KitchenMarland KitchenMarland Kitchen     Kidney  Stones> hx several stones in the past; he passed them all, last ~3yrs ago; no current symptoms...    Seborrheic Dermatitis> told by Nanda Quinton that it was a form of Psoriasis & treated w/ a topical cream- improved... EXAM shows Afeb, VSS, O2sat=99% on RA;  HEENT- neg, mallampati1;  Chest- clear w/o m/r/g;  Heart- RR w/o m/r/g heard;  Abd- soft, nontender, neg;  Ext- neg w/o c/c/e;  Neuro- intact...  LABS 04/16/16>  FLP at goals on Atorva10;  Chems- wnl;  CBC- wnl;  TSH=2.12;  PSA=2.02;  VitD=44 IMP/PLAN>>  Jeff Graves continues to do well- great job w/ diet 7 exercise, continue same meds, call for any problems...    ~  April 16, 2017:  Yearly Jeff Graves check up>                Problem List:    HYPERTENSION (ICD-401.9) - controlled on LOSARTAN Rx... ~  12/10: notes sl postural dizziness on & off so we decided to change Hyzaar to LOSARTAN 100mg /d. ~  12/11:  doing very well on the Losartan 100mg /d- BP 110/70 & similar at home;  CXR is clear, WNL;  EKG shows sinus brady, NAD... ~  12/12:  BP= 120/68 & he remains asymptomatic;  CXR remains clear;  EKG shows SBrady rate46 otherw wnl... ~  12/13: on Losar100; BP= 118/68 w/o postural change; he denies CP, palpit, SOB, edema; but notes the intermittent dizziness w/ strenuous activity- refer to Cards. ~  12/14: on Losar100-1/2 tab; BP= 110/60 w/o postural change; he denies CP, palpit, SOB, edema...  Dizziness, Tachy-Brady> he has marked sinus brady & not on BBlockers, etc; notes some dizziness w/ strenuous activ but no syncope; suggest Cardiac eval w/ treadmill & holter for further eval...  ~  EKG 12/13 showed SBrady, rate39, NAD, borderline tracing... ~  2DEcho 2/14 showed norm LV size 7 function, EF=55-60%, Gr1DD, mildly thickened leaflets, mildAI, mildMR, modPR, PAsys=33... ~  GXT 2/14 showed exercise to 11 mets, no ischemia, no angina, NSSTTWA... ~  Holter monitor 3/14 showed atrial tachy to 190, PACs, NSR & SBrady ~  4/14: Cards decided  on a trial of Flecainide- 100mg Bid w/ resolution of his tachypalpit... ~  EKG 6/14 by DrKlein showed SBrady, rate49, otherw wnl...  ~  12/14: SBrady- Atrial Tachy to 190- PACs; DrKlein placed him on Flecainide 100mg Bid & he is improved ~  3/16:  He remains stable on Flecainide100Bid; last saw DrKlein 9/15- noted some atypCP but felt to be stable... ~  11/16:  Myoview 12/2014 showed EF=50% & 2 small reversible defects in the distal LAD & LCx territories- an intermediate risk study  HYPERCHOLESTEROLEMIA (ICD-272.0) - controlled on diet and LIPITOR 10mg /d... ~  [place] 11/07 showed TChol 147, TG 24, HDL 61, LDL 81... ~  [place] 12/08 showed TChol 167, TG 25, HDL 69, LDL 93... continue same Rx. ~  FLP 12.09 showed TChol 163, TG 20, HDL 66. LDL 93... rec- same. ~  FLP 12/10 showed TChol 161, TG 20, HDL 83, LDL 74 ~  FLP 12/11 on Lip10 showed TChol 155, TG 24, HDL 66, LDL 84 ~  FLP 12/12 on Lip10 showed TChol 166, TG 17, HDL 74, LDL 89 ~  FLP 12/13 on Lip10 showed TChol 161, TG 27, HDL 77, LDL 79 ~  FLP 12/14 on Lip10 showed TChol 183, TG 11, HDL 94, LDL 87 ~  FLP 3/16 on Lip10 showed TChol 165, TG 43, HDL 76, LDL 81 ~  FLP 3/17 on Atorva10 showed TChol 167, TG 32, HDL 82, LDL 79  DIVERTICULITIS, COLON (ICD-562.11) & HEMORRHOIDS (ICD-455.6) >>  ~  last colonoscopy 11/04 by [name] w/ divertics & int hems, f/u planned 74yrs per GI protocol... ~  he had f/u colonoscopy 2/15 by [name]- NEG x sm int hems; he rec repeat colon in 40yrs...  DEGENERATIVE DISC DISEASE, CERVICAL SPINE (ICD-722.4) & SHOULDER PAIN (ICD-719.41) - eval 6/97 by Marciano Sequin w/ MRI showing DDD & mild sp stenosis w/o nerve root impingement - Rx conservatively;  increased pain in 2/06  w/ eval DrElsner and MRI showing spondylosis at 3 levels, again Rx conserv...  he uses CELEBREX 200mg  Prn for pain... ~  2/16: he fell w/ left shoulder pain & eval by [name]- he had left rotator cuff surg 2/16, followed by rehab, improved...  RENAL  CALCULUS, HX OF (ICD-V13.01) - he's had 3 stones in the past and managed to pass all of them... last about 6 years ago... no prob currently.  VITAMIN D Deficiency >> Vit D level 3/17 = 21 & rec to start VitD 2000u daily...  RASH >> looked like seb dermatitis to me but Derm told him it was a form of Psoriasis & Rx by DrAJordan w/ topical cream==> improved...  Health Maint - on ASA 81mg /d...  ~  GI:  Followed by [name] & he is up to date w/ colon done 2/15- neg... ~  GU:  His DRE is neg/ wnl & PSAs have been wnl as well... ~  Immuniz:  He gets the yearly flu vaccines;  He had TDAP 12/13;  He had remote Pneumovax 2003;  Given PREVNAR-13 3/16...   Past Surgical History:  Procedure Laterality Date  . COLONOSCOPY    . None    . SHOULDER SURGERY Left 03/29/2014     Outpatient Encounter Medications as of 04/16/2017  Medication Sig  . aspirin EC 81 MG tablet Take 1 tablet (81 mg total) by mouth daily.  [name] atorvastatin (LIPITOR) 10 MG tablet TAKE 1 TABLET (10 MG TOTAL) BY MOUTH DAILY.  [name] Cholecalciferol (VITAMIN D) 2000 units CAPS Take 2,000 Units by mouth daily.  [name] loratadine (CLARITIN) 10 MG tablet Take 10 mg by mouth daily as needed.   . ranolazine (RANEXA) 500 MG 12 hr tablet Take 1 tablet (500 mg total) by mouth 2 (two) times daily.  . [DISCONTINUED] losartan (COZAAR) 100 MG tablet TAKE ONE-HALF TABLET (50MG  TOTAL) BY MOUTH DAILY  . [DISCONTINUED] promethazine (PHENERGAN) 12.5 MG tablet 1-2 every up to every 4 h prn nausea  . lisinopril (PRINIVIL,ZESTRIL) 10 MG tablet Take 1 tablet (10 mg total) by mouth daily.   No facility-administered encounter medications on file as of 04/16/2017.     No Known Allergies   Immunization History  Administered Date(s) Administered  . H1N1 01/21/2008  . Influenza Split 01/22/2011, 01/23/2012, 12/06/2012  . Influenza Whole 01/21/2007, 01/20/2009, 12/05/2009  . Influenza, High Dose Seasonal PF 10/30/2016  . Influenza,inj,Quad PF,6+ Mos 12/15/2013,  11/12/2014  . Pneumococcal Conjugate-13 04/15/2014  . Pneumococcal Polysaccharide-23 04/16/2016  . Tdap 01/23/2012    Current Medications, Allergies, Past Medical History, Past Surgical History, Family History, and Social History were reviewed in [employer] record.    Review of Systems    The patient denies fever, chills, sweats, anorexia, fatigue, weakness, malaise, weight loss, sleep disorder, blurring, diplopia, eye irritation, eye discharge, vision loss, eye pain, photophobia, earache, ear discharge, tinnitus, decreased hearing, nasal congestion, nosebleeds, sore throat, hoarseness, chest pain, palpitations, syncope, dyspnea on exertion, orthopnea, PND, peripheral edema, cough, dyspnea at rest, excessive sputum, hemoptysis, wheezing, pleurisy, nausea, vomiting, diarrhea, constipation, change in bowel habits, abdominal pain, melena, hematochezia, jaundice, gas/bloating, indigestion/heartburn, dysphagia, odynophagia, dysuria, hematuria, urinary frequency, urinary hesitancy, nocturia, incontinence, back pain, joint pain, joint swelling, muscle cramps, muscle weakness, stiffness, arthritis, sciatica, restless legs, leg pain at night, leg pain with exertion, rash, itching, dryness, suspicious lesions, paralysis, paresthesias, seizures, tremors, vertigo, transient blindness, frequent falls, frequent headaches, difficulty walking, depression, anxiety, memory loss, confusion, cold intolerance, heat intolerance, polydipsia, polyphagia, polyuria,  unusual weight change, abnormal bruising, bleeding, enlarged lymph nodes, urticaria, allergic rash, hay fever, and recurrent infections.     Objective:   Physical Exam    WD, WN, 72 y/o WM in NAD... GENERAL:  Alert & oriented; pleasant & cooperative... HEENT:  [place]/AT, EOM-wnl, PERRLA, EACs-clear, TMs-wnl, NOSE-clear, THROAT-clear & wnl. NECK:  Supple w/ fairROM; no JVD; normal carotid impulses w/o bruits; no thyromegaly or nodules  palpated; no lymphadenopathy. CHEST:  Clear to P & A; without wheezes/ rales/ or rhonchi. HEART:  Regular Rhythm; without murmurs/ rubs/ or gallops. ABDOMEN:  Soft & nontender; normal bowel sounds; no organomegaly or masses detected. RECTAL:  Neg - prostate 2+ & nontender w/o nodules; stool hematest neg. EXT: without deformities or arthritic changes; no varicose veins/ venous insuffic/ or edema. NEURO:  CN's intact; motor testing normal; sensory testing normal; gait normal & balance OK. DERM:  Improved seborrheic dermatitis in scalp (dandruff), some on face, and arms...  RADIOLOGY DATA:  Reviewed in the EPIC EMR & discussed w/ the patient...  LABORATORY DATA:  Reviewed in the EPIC EMR & discussed w/ the patient...   Assessment & Plan:    CPX 04/16/16 >> Lavarr continues to do well- great job w/ diet 7 exercise, continue same meds, call for any problems   HBP>  Controlled on Losartan50; continue same...  Cardiac Arrhythmia>  he has marked sinus brady & not on BBlockers, etc; noted some dizziness w/ strenuous activ but no syncope; referred to Cards w/ [facility] & DrKlein- Holter w/ SVT & treated w/ Tambocor100Bid & improved... ~  Abnormal Myoview as noted... ~  On-going medication adjustments by DrKlein...  CHOL>  FLP looks great on Lip10...   GI> Divertics, Hems>  Stable, asymptomatic, his follow up colon is due soon & he will call GI to set this up...  Hx Kidney Stones>  Stable, no recurrence...  DJD> CSpine, Shoulder pain>  He uses Celebrex as needed...  Vit D Deficiency>  Vit D level Mar2017 = 21 & started on VitD 2000u daily...  Other medical problems as noted...   Patient's Medications  New Prescriptions   LISINOPRIL (PRINIVIL,ZESTRIL) 10 MG TABLET    Take 1 tablet (10 mg total) by mouth daily.  Previous Medications   ASPIRIN EC 81 MG TABLET    Take 1 tablet (81 mg total) by mouth daily.   ATORVASTATIN (LIPITOR) 10 MG TABLET    TAKE 1 TABLET (10 MG TOTAL)  BY MOUTH DAILY.   CHOLECALCIFEROL (VITAMIN D) 2000 UNITS CAPS    Take 2,000 Units by mouth daily.   LORATADINE (CLARITIN) 10 MG TABLET    Take 10 mg by mouth daily as needed.    RANOLAZINE (RANEXA) 500 MG 12 HR TABLET    Take 1 tablet (500 mg total) by mouth 2 (two) times daily.  Modified Medications   No medications on file  Discontinued Medications   LOSARTAN (COZAAR) 100 MG TABLET    TAKE ONE-HALF TABLET (50MG  TOTAL) BY MOUTH DAILY   PROMETHAZINE (PHENERGAN) 12.5 MG TABLET    1-2 every up to every 4 h prn nausea

## 2017-04-16 NOTE — Patient Instructions (Signed)
Today we updated your med list in our EPIC system...    Continue your current medications the same...    We decided to STOP the prev Losartan & change it to LISINOPRIL 10mg  tabs - one tab daily...       Monitor your BP at home on the new med & call for any questions...  Today we did your follow up CXR & FASTING blood work...    We will contact you w/ the results when available...   Keep up your great job w/ diet & exercise...  Call for any questions or if we can be of service in any way!

## 2017-05-17 ENCOUNTER — Other Ambulatory Visit: Payer: Self-pay | Admitting: Pulmonary Disease

## 2017-05-20 ENCOUNTER — Other Ambulatory Visit: Payer: Self-pay | Admitting: Pulmonary Disease

## 2017-05-23 ENCOUNTER — Other Ambulatory Visit: Payer: Self-pay | Admitting: Pulmonary Disease

## 2017-05-28 ENCOUNTER — Telehealth: Payer: Self-pay | Admitting: Pulmonary Disease

## 2017-05-28 MED ORDER — LOSARTAN POTASSIUM 100 MG PO TABS: 100.0000 mg | ORAL_TABLET | Freq: Every day | ORAL | 2 refills | Status: DC

## 2017-05-28 NOTE — Telephone Encounter (Signed)
Per SN- ok to stop Lisinopril  and restart Losartan 100mg  #90, take 1 PO daily. Called Patient and he stated that he would like prescription sent to [facility] on [address]. Losartan 100mg  order placed and sent to preferred pharmacy.  Nothing further needed at this time.

## 2017-05-28 NOTE — Telephone Encounter (Signed)
Called and spoke with patient he states that his dosage of Losartan was not one that had been recalled. He is asking that we place him back on the Losartan 100 mg tablet.   SN please advise, thank you.   Current Outpatient Medications on File Prior to Visit  Medication Sig Dispense Refill  . aspirin EC 81 MG tablet Take 1 tablet (81 mg total) by mouth daily.    [name] atorvastatin (LIPITOR) 10 MG tablet TAKE 1 TABLET (10 MG TOTAL) BY MOUTH DAILY. 90 tablet 3  . Cholecalciferol (VITAMIN D) 2000 units CAPS Take 2,000 Units by mouth daily.    [name] lisinopril (PRINIVIL,ZESTRIL) 10 MG tablet Take 1 tablet (10 mg total) by mouth daily. 90 tablet 0  . loratadine (CLARITIN) 10 MG tablet Take 10 mg by mouth daily as needed.     . ranolazine (RANEXA) 500 MG 12 hr tablet Take 1 tablet (500 mg total) by mouth 2 (two) times daily. 180 tablet 3   No current facility-administered medications on file prior to visit.    No Known Allergies

## 2017-06-19 ENCOUNTER — Encounter: Payer: Self-pay | Admitting: Internal Medicine

## 2017-06-19 ENCOUNTER — Ambulatory Visit: Payer: PPO | Admitting: Internal Medicine

## 2017-06-19 VITALS — BP 146/80 | HR 61 | Ht 70.0 in | Wt 163.0 lb

## 2017-06-19 DIAGNOSIS — R001 Bradycardia, unspecified: Secondary | ICD-10-CM | POA: Diagnosis not present

## 2017-06-19 DIAGNOSIS — I471 Supraventricular tachycardia: Secondary | ICD-10-CM

## 2017-06-19 MED ORDER — RANOLAZINE ER 500 MG PO TB12: 500.0000 mg | ORAL_TABLET | Freq: Two times a day (BID) | ORAL | 3 refills | Status: DC

## 2017-06-19 NOTE — Progress Notes (Signed)
Patient Care Team: Noralee Space, MD as PCP - General (Pulmonary Disease)   HPI  Jeff Graves is a 72 y.o. male  Seen in followup for atrial tachycardia for which he took flecainide   He also has a history of hypertension  Stress testing had demonstrated normal blood pressure response normal heart rate response to exercise; during the summer he is also noted episodes of short lasting chest discomfort  . It was unassociated with exertion.it lasted about a minute. Because of this we undertook Myoview scanning which is abnormal.  >>>>Nuclear stress EF: 50%.  There was no ST segment deviation noted during stress.  Defect 1: There is a small defect of mild severity present in the basal inferolateral and mid inferolateral location.  Defect 2: There is a small defect of moderate severity present in the apex location.  Findings consistent with ischemia.  This is an intermediate risk study.  The left ventricular ejection fraction is mildly decreased (45-54%).  This prompted [place] to stop his flecainide. We added amlodipine as a calcium blocker in the hopes that we would have antiarrhythmic benefit.  This was inadequate. We discussed next the use of ranolazine versus dronaderone. The former was initiated and he was seen 2 months later-4/17 doing quite well.   The patient denies chest pain, shortness of breath, nocturnal dyspnea, orthopnea or peripheral edema.  There have been no palpitations or syncope.  Some dizziness which is his normal   Past Medical History:  Diagnosis Date  . Allergy    seasonal  . Atrial tachycardia ([place]) 06/04/2012   12/13-QRS duration-102 ms 6/14-QRS duration-98 ms   . DDD (degenerative disc disease)   . Diverticulosis of colon   . Hemorrhoids   . Hypercholesterolemia   . Hypertension   . PAC (premature atrial contraction)   . Renal calculus   . Shoulder pain   . Sinus bradycardia     Past Surgical History:  Procedure Laterality Date  .  COLONOSCOPY    . None    . SHOULDER SURGERY Left 03/29/2014    Current Outpatient Medications  Medication Sig Dispense Refill  . aspirin EC 81 MG tablet Take 1 tablet (81 mg total) by mouth daily.    [name] atorvastatin (LIPITOR) 10 MG tablet TAKE 1 TABLET (10 MG TOTAL) BY MOUTH DAILY. 90 tablet 3  . Cholecalciferol (VITAMIN D) 2000 units CAPS Take 2,000 Units by mouth daily.    [name] loratadine (CLARITIN) 10 MG tablet Take 10 mg by mouth daily as needed.     [name] losartan (COZAAR) 100 MG tablet Take 1 tablet (100 mg total) by mouth daily. 90 tablet 2  . ranolazine (RANEXA) 500 MG 12 hr tablet Take 1 tablet (500 mg total) by mouth 2 (two) times daily. 180 tablet 3   No current facility-administered medications for this visit.     No Known Allergies  Review of Systems negative except from HPI and PMH  Physical Exam BP (!) 146/80   Pulse 61   Ht 5\' 10"  (1.778 m)   Wt 163 lb (73.9 kg)   SpO2 98%   BMI 23.39 kg/m  Well developed and nourished in no acute distress HENT normal Neck supple with JVP-flat Clear Regular rate and rhythm, no murmurs or gallops Abd-soft with active BS No Clubbing cyanosis edema Skin-warm and dry A & Oriented  Grossly normal sensory and motor function   ECG today demonstrated s sinus 52 15/09/42  Otherwise normal  Assessment and  Plan    Atrial tachycardia-flecainide stopped on ranolazine  Lightheadedness likely neurally mediated   Sinus bradycardia  Hypertension    ZBP reasaonable controolled  No palps  Tolerating ranolazine

## 2017-06-19 NOTE — Patient Instructions (Signed)
Medication Instructions:  Your physician recommends that you continue on your current medications as directed. Please refer to the Current Medication list given to you today.  Labwork: None ordered.  Testing/Procedures: None ordered.  Follow-Up: Your physician wants you to follow-up in: One Year with Renee Ursuy. You will receive a reminder letter in the mail two months in advance. If you don't receive a letter, please call our office to schedule the follow-up appointment.   Any Other Special Instructions Will Be Listed Below (If Applicable).     If you need a refill on your cardiac medications before your next appointment, please call your pharmacy.  

## 2017-07-15 DIAGNOSIS — H6122 Impacted cerumen, left ear: Secondary | ICD-10-CM | POA: Diagnosis not present

## 2017-07-15 DIAGNOSIS — H6121 Impacted cerumen, right ear: Secondary | ICD-10-CM | POA: Diagnosis not present

## 2017-07-15 DIAGNOSIS — H6123 Impacted cerumen, bilateral: Secondary | ICD-10-CM | POA: Diagnosis not present

## 2017-07-17 ENCOUNTER — Emergency Department (HOSPITAL_COMMUNITY): Payer: PRIVATE HEALTH INSURANCE

## 2017-07-17 ENCOUNTER — Other Ambulatory Visit: Payer: Self-pay

## 2017-07-17 ENCOUNTER — Encounter (HOSPITAL_COMMUNITY): Payer: Self-pay | Admitting: Emergency Medicine

## 2017-07-17 ENCOUNTER — Emergency Department (HOSPITAL_COMMUNITY)
Admission: EM | Admit: 2017-07-17 | Discharge: 2017-07-17 | Disposition: A | Discharge status: Home / Self Care | Payer: PRIVATE HEALTH INSURANCE | Attending: Emergency Medicine | Admitting: Emergency Medicine

## 2017-07-17 DIAGNOSIS — M545 Low back pain: Secondary | ICD-10-CM | POA: Diagnosis not present

## 2017-07-17 DIAGNOSIS — S32001A Stable burst fracture of unspecified lumbar vertebra, initial encounter for closed fracture: Secondary | ICD-10-CM

## 2017-07-17 DIAGNOSIS — W11XXXA Fall on and from ladder, initial encounter: Secondary | ICD-10-CM | POA: Diagnosis not present

## 2017-07-17 DIAGNOSIS — S32012A Unstable burst fracture of first lumbar vertebra, initial encounter for closed fracture: Secondary | ICD-10-CM | POA: Diagnosis not present

## 2017-07-17 DIAGNOSIS — Y999 Unspecified external cause status: Secondary | ICD-10-CM | POA: Insufficient documentation

## 2017-07-17 DIAGNOSIS — Z87891 Personal history of nicotine dependence: Secondary | ICD-10-CM | POA: Insufficient documentation

## 2017-07-17 DIAGNOSIS — Y939 Activity, unspecified: Secondary | ICD-10-CM | POA: Diagnosis not present

## 2017-07-17 DIAGNOSIS — Z79899 Other long term (current) drug therapy: Secondary | ICD-10-CM | POA: Insufficient documentation

## 2017-07-17 DIAGNOSIS — S32011A Stable burst fracture of first lumbar vertebra, initial encounter for closed fracture: Secondary | ICD-10-CM | POA: Diagnosis not present

## 2017-07-17 DIAGNOSIS — Z7982 Long term (current) use of aspirin: Secondary | ICD-10-CM | POA: Diagnosis not present

## 2017-07-17 DIAGNOSIS — Y929 Unspecified place or not applicable: Secondary | ICD-10-CM | POA: Diagnosis not present

## 2017-07-17 DIAGNOSIS — I10 Essential (primary) hypertension: Secondary | ICD-10-CM | POA: Diagnosis not present

## 2017-07-17 DIAGNOSIS — S3992XA Unspecified injury of lower back, initial encounter: Secondary | ICD-10-CM | POA: Diagnosis present

## 2017-07-17 MED ORDER — NAPROXEN 250 MG PO TABS
500.0000 mg | ORAL_TABLET | Freq: Once | ORAL | Status: AC
  Administered 2017-07-17: 500 mg via ORAL
  Filled 2017-07-17: qty 2

## 2017-07-17 MED ORDER — ACETAMINOPHEN 325 MG PO TABS
650.0000 mg | ORAL_TABLET | Freq: Once | ORAL | Status: AC
  Administered 2017-07-17: 650 mg via ORAL
  Filled 2017-07-17: qty 2

## 2017-07-17 NOTE — Discharge Instructions (Addendum)
It is important that you wear the brace at home.  You can take it off when you are showering or at bedtime.  Please call and make an appointment with neurosurgeon Dr. Arnoldo Morale for follow-up in 1 to 2 weeks.  Call his office tomorrow to schedule the appointment.  I have listed the information to his office below.  Take tylenol and aleve at home for pain.   Return to the ER immediately if you have any new or concerning symptoms like numbness, weakness, loss of bowel or bladder control.

## 2017-07-17 NOTE — ED Notes (Signed)
Ortho at the bedside.

## 2017-07-17 NOTE — Progress Notes (Signed)
Orthopedic Tech Progress Note Patient Details:  [name] Aug 02, 1945 [id number] Called [employer] for brace. Patient ID: Jeff Graves, male   DOB: 28-Nov-1945, 72 y.o.   MRN: [id number]   Braulio Bosch 07/17/2017, 8:58 PM

## 2017-07-17 NOTE — ED Provider Notes (Signed)
[facility] EMERGENCY DEPARTMENT Provider Note   CSN: [id number] Arrival date & time: 07/17/17  1458     History   Chief Complaint Chief Complaint  Patient presents with  . Fall  . Back Pain    HPI Jeff Graves is a 72 y.o. male.  HPI   Patient is a 72yo male with a history of hypertension, hyperlipidemia, degenerative disc disease who presents to the emergency department for evaluation after falling off of a ladder today.  Patient reports that around 2 PM he was on a ladder, approximately 12 feet up when he accidentally lost his footing and fell.  He reports landing on his feet, and "felt my back compressing."  Denies hitting his head or loss of consciousness. Now states that he has bilateral lower back aching which is worsened with movement.  Denies midline spinal tenderness.  He has not taken any over-the-counter medications for his symptoms.  Reporting some aching pain in his left posterior calf which is worsened with movement. No knee or ankle pain. No heel pain. Denies headache, numbness, weakness, loss of bowel or bladder control, open wound.  Is able to ambulate independently without difficulty.   Past Medical History:  Diagnosis Date  . Allergy    seasonal  . Atrial tachycardia ([place]) 06/04/2012   12/13-QRS duration-102 ms 6/14-QRS duration-98 ms   . DDD (degenerative disc disease)   . Diverticulosis of colon   . Hemorrhoids   . Hypercholesterolemia   . Hypertension   . PAC (premature atrial contraction)   . Renal calculus   . Shoulder pain   . Sinus bradycardia     Patient Active Problem List   Diagnosis Date Noted  . BPH with obstruction/lower urinary tract symptoms 04/16/2017  . S/P rotator cuff surgery 04/15/2014  . Diverticulosis of colon without hemorrhage 01/22/2013  . Cervical disc disorder with radiculopathy of cervical region 01/22/2013  . Postural dizziness 08/06/2012  . Atrial tachycardia 06/04/2012  . HYPERCHOLESTEROLEMIA  12/26/2006  . Essential hypertension 12/26/2006  . RENAL CALCULUS, HX OF 12/26/2006    Past Surgical History:  Procedure Laterality Date  . COLONOSCOPY    . None    . SHOULDER SURGERY Left 03/29/2014        Home Medications    Prior to Admission medications   Medication Sig Start Date End Date Taking? Authorizing Provider  aspirin EC 81 MG tablet Take 1 tablet (81 mg total) by mouth daily. 06/04/12  Yes [name], MD  atorvastatin (LIPITOR) 10 MG tablet TAKE 1 TABLET (10 MG TOTAL) BY MOUTH DAILY. 05/19/17  Yes Noralee Space, MD  Cholecalciferol (VITAMIN D) 2000 units CAPS Take 2,000 Units by mouth daily.   Yes [provider]  loratadine (CLARITIN) 10 MG tablet Take 10 mg by mouth daily as needed.    Yes [provider]  losartan (COZAAR) 100 MG tablet Take 1 tablet (100 mg total) by mouth daily. Patient taking differently: Take 50 mg by mouth daily.  05/28/17  Yes Noralee Space, MD  Multiple Vitamins-Minerals (MULTIVITAMIN WITH MINERALS) tablet Take 1 tablet by mouth daily.   Yes [provider]  ranolazine (RANEXA) 500 MG 12 hr tablet Take 1 tablet (500 mg total) by mouth 2 (two) times daily. 06/19/17  Yes [name], MD    Family History Family History  Problem Relation Age of Onset  . Heart attack Father 56  . Valvular heart disease Mother 81  . Lymphoma Sister  Deceased  . Breast cancer Sister        Alive  . Colon cancer Neg Hx   . Esophageal cancer Neg Hx   . Rectal cancer Neg Hx   . Stomach cancer Neg Hx     Social History Social History   Tobacco Use  . Smoking status: Former Smoker    Packs/day: 0.50    Years: 20.00    Pack years: 10.00    Types: Cigarettes    Last attempt to quit: 02/12/1984    Years since quitting: 33.4  . Smokeless tobacco: Never Used  Substance Use Topics  . Alcohol use: No    Alcohol/week: 0.0 oz    Comment: social use  . Drug use: No     Allergies   Patient has no known  allergies.   Review of Systems Review of Systems  Respiratory: Negative for shortness of breath.   Cardiovascular: Negative for chest pain.  Gastrointestinal: Negative for anal bleeding, nausea and vomiting.  Genitourinary: Negative for difficulty urinating.  Musculoskeletal: Positive for back pain and myalgias (right calf pain). Negative for gait problem and neck pain.  Skin: Negative for wound.  Neurological: Negative for weakness, light-headedness, numbness and headaches.  Psychiatric/Behavioral: Negative for agitation.     Physical Exam Updated Vital Signs BP 132/76 (BP Location: Right Arm)   Pulse 67   Temp 98.7 F (37.1 C) (Oral)   Resp 18   Ht 5\' 10"  (1.778 m)   Wt 72.6 kg (160 lb)   SpO2 99%   BMI 22.96 kg/m   Physical Exam  Constitutional: He is oriented to person, place, and time. He appears well-developed and well-nourished. No distress.  Sitting at bedside in no apparent distress, nontoxic-appearing.  HENT:  Head: Normocephalic and atraumatic.  Eyes: Right eye exhibits no discharge. Left eye exhibits no discharge.  Neck: Normal range of motion. Neck supple.  No midline cervical spine tenderness.  Cardiovascular: Normal rate, regular rhythm and intact distal pulses.  Pulmonary/Chest: Effort normal and breath sounds normal. No stridor. No respiratory distress. He has no wheezes. He has no rales.  Musculoskeletal:  No midline t-spine or l-spine tenderness. No step off or deformity appreciated. Mild paraspinal muscle tenderness bilaterally in the lumbar spine. Strength 5/5 in bilateral knee flexion/extension and ankle  dorsiflexion/plantarflexion.  Patient has mild tenderness to palpation over the left calf, no overlying bruise or break in skin.  No tenderness over the knee, ankle, heel or foot bilaterally.  Full active range of motion of bilateral knees and ankles.  DP pulses 2+ bilaterally.  Neurological: He is alert and oriented to person, place, and time.  Coordination normal.  Sensation to light touch intact in bilateral LE. Gait normal in coordination and balance.   Skin: Skin is warm and dry. He is not diaphoretic.  Psychiatric: He has a normal mood and affect. His behavior is normal.  Nursing note and vitals reviewed.    ED Treatments / Results  Labs (all labs ordered are listed, but only abnormal results are displayed) Labs Reviewed - No data to display  EKG None  Radiology Dg Lumbar Spine Complete  Result Date: 07/17/2017 CLINICAL DATA:  Low back pain. Fell from 12 foot ladder today. Fall with low back impact. EXAM: LUMBAR SPINE - COMPLETE 4+ VIEW COMPARISON:  None. FINDINGS: A superior endplate fracture is present at L1. There is 40% loss of height anteriorly. This is likely acute. A superior endplate fracture is also suspected at L2. There is  slight retrolisthesis at L1-2. Minimal anterolisthesis is present at L4-5. No additional fractures are evident. Vascular calcifications are present. IMPRESSION: 1. Superior endplate fracture at L1 is likely acute. 2. Suspect nondisplaced fracture at L2. Electronically Signed   By: San Morelle M.D.   On: 07/17/2017 16:45   Ct Lumbar Spine Wo Contrast  Result Date: 07/17/2017 CLINICAL DATA:  Fell 12 feet from ladder. Low back pain. Suspect fracture. EXAM: CT LUMBAR SPINE WITHOUT CONTRAST TECHNIQUE: Multidetector CT imaging of the lumbar spine was performed without intravenous contrast administration. Multiplanar CT image reconstructions were also generated. COMPARISON:  Lumbar spine radiograph July 17, 2017 FINDINGS: SEGMENTATION: For the purposes of this report the last well-formed intervertebral disc space is reported as L5-S1. ALIGNMENT: Maintained lumbar lordosis. Minimal grade 1 L4-5 anterolisthesis without spondylolysis. VERTEBRAE: Acute L1 burst fracture with 30-50% height loss, 2 mm retropulsed bony fragments. Remaining lumbar vertebral bodies intact. Moderate to severe L1 disc height  loss with vacuum disc and endplate spurring, L2 superior endplate Schmorl's node. Osteopenia. No destructive bony lesions. Subcentimeter sclerotic probable bone island left iliac bone. Mild sacroiliac osteoarthrosis. PARASPINAL AND OTHER SOFT TISSUES: Bilateral non-obstructing nephrolithiasis measuring to 2 mm. Severe calcific atherosclerosis aortoiliac vessels. Moderate amount of retained large bowel stool. DISC LEVELS: T12-L1: Retropulsed bony fragments at L1 without canal stenosis or neural foraminal narrowing. L1-2: Minimal disc osteophyte complex, no canal stenosis or neural foraminal narrowing. L2-3 small broad-based disc bulge asymmetric to the left. No canal stenosis. Mild left neural foraminal narrowing. L3-4: Small broad-based disc bulge asymmetric to the left. Mild facet arthropathy and ligamentum flavum redundancy. No canal stenosis. Minimal left neural foraminal narrowing. L4-5: Anterolisthesis. Moderate broad-based disc bulge. Moderate to severe facet arthropathy and ligamentum flavum redundancy. Moderate to severe canal stenosis. Mild neural foraminal narrowing. L5-S1: Small broad-based disc bulge. Severe right and mild left facet arthropathy. No canal stenosis. Mild to moderate neural foraminal narrowing. IMPRESSION: 1. Acute mild-to-moderate L1 burst fracture, 2 mm retropulsed bony fragments. 2. Minimal grade 1 L4-5 anterolisthesis without spondylolysis. 3. Moderate to severe canal stenosis L4-5. Neural foraminal narrowing L2-3 through L5-S1: Mild to moderate at L5-S1. Electronically Signed   By: Elon Alas M.D.   On: 07/17/2017 20:05    Procedures Procedures (including critical care time)  Medications Ordered in ED Medications  acetaminophen (TYLENOL) tablet 650 mg (650 mg Oral Given 07/17/17 1917)  naproxen (NAPROSYN) tablet 500 mg (500 mg Oral Given 07/17/17 1917)     Initial Impression / Assessment and Plan / ED Course  I have reviewed the triage vital signs and the nursing  notes.  Pertinent labs & imaging results that were available during my care of the patient were reviewed by me and considered in my medical decision making (see chart for details).  Clinical Course as of Jul 18 2014  Thu Jul 17, 5032  5148 55-year-old male status post fall off a ladder landing on his feet and then to his butt.  He is complaining of some low back and buttock pain.  By plain films he is got some compression fractures and is pending a CT.  He is neurologically intact.  Dispel per CT.   [MB]    Clinical Course User Index [MB] Hayden Rasmussen, MD    CT lumbar spine reveals acute L1 burst fracture.  On exam, no neuro deficits and patient is neurovascularly intact.[name] Spoke with Neurosurgeon Dr. Arnoldo Morale who also viewed the CT lumbar spine scan and would like pt placed in TLSO brace  and f/u in his office in 1-2wks. TLSO brace placed in the ED.  Discussed plan with patient who agrees and appears reliable for follow-up.  Prior to discharge counseled him on reasons to return to the emergency department and he agrees.  This was a shared visit with Dr. Melina Copa who also saw the patient and agrees with plan.  Final Clinical Impressions(s) / ED Diagnoses   Final diagnoses:  Closed burst fracture of lumbar vertebra, initial encounter [facility])    ED Discharge Orders    None       Bernarda Caffey 07/18/17 0210    Hayden Rasmussen, MD 07/19/17 1239

## 2017-07-17 NOTE — ED Notes (Signed)
Ortho paged for TLSO.  

## 2017-07-17 NOTE — ED Triage Notes (Signed)
Patient presents to ED for assessment after slipping down a 12 foot ladder, landing on his feet.  Pt states "I felt everything compress in my back" and is not having lower back pain.  Pt c/o coccyx pain as well.  Denies head injury or LOC

## 2017-07-17 NOTE — ED Notes (Signed)
Pt verbalized understanding discharge instructions and denies any further needs or questions at this time. VS stable, ambulatory and steady gait.   

## 2017-08-05 ENCOUNTER — Ambulatory Visit (HOSPITAL_COMMUNITY)
Admission: RE | Admit: 2017-08-05 | Discharge: 2017-08-05 | Disposition: A | Discharge status: Home / Self Care | Payer: PRIVATE HEALTH INSURANCE | Source: Ambulatory Visit | Attending: Cardiovascular Disease | Admitting: Cardiovascular Disease

## 2017-08-05 ENCOUNTER — Other Ambulatory Visit (HOSPITAL_COMMUNITY): Payer: Self-pay | Admitting: Physician Assistant

## 2017-08-05 DIAGNOSIS — M7989 Other specified soft tissue disorders: Secondary | ICD-10-CM

## 2017-08-05 DIAGNOSIS — R2242 Localized swelling, mass and lump, left lower limb: Secondary | ICD-10-CM | POA: Insufficient documentation

## 2017-10-20 ENCOUNTER — Telehealth: Payer: Self-pay | Admitting: Internal Medicine

## 2017-10-20 NOTE — Telephone Encounter (Signed)
Copied from [place] [id number]. Topic: Appointment Scheduling - Scheduling Inquiry for Clinic >> Oct 20, 2017 10:21 AM Synthia Innocent wrote: Reason for CRM: Patient is requesting to establish care with Dr Alain Marion, currently sees Dr Lenna Gilford and he is recommending him. Please advise.   Dr. Alain Marion are you okay with this?

## 2017-11-19 NOTE — Telephone Encounter (Signed)
Since Dr. Camila Li has not responded and it has been a month. I did call and inform patient that Dr.Plotnikov is not taking on patients at this time and we could get him set up with a NP here or at another location. He is going to look at the web site at the providers and locations to see what maybe best for him.

## 2017-11-27 ENCOUNTER — Ambulatory Visit: Payer: PPO | Admitting: Nurse Practitioner

## 2017-11-27 ENCOUNTER — Telehealth: Payer: Self-pay | Admitting: Pulmonary Disease

## 2017-11-27 ENCOUNTER — Encounter: Payer: Self-pay | Admitting: Nurse Practitioner

## 2017-11-27 VITALS — BP 128/88 | HR 68 | Ht 69.0 in | Wt 163.0 lb

## 2017-11-27 DIAGNOSIS — J209 Acute bronchitis, unspecified: Secondary | ICD-10-CM | POA: Diagnosis not present

## 2017-11-27 MED ORDER — AZITHROMYCIN 250 MG PO TABS: ORAL_TABLET | ORAL | 0 refills | Status: DC

## 2017-11-27 NOTE — Patient Instructions (Addendum)
Will order azithromycin May take mucinex May take delsym for cough May take Claritin daily Continue current medications Follow up if symptoms worsen

## 2017-11-27 NOTE — Telephone Encounter (Signed)
Called and spoke with pt who states he has had some congestion, coughing up yellow phlegm, which is no better after taking OTC meds.  Pt states symptoms have been going on x3 weeks. States he had been in [place] when symptoms began.  I stated to pt we needed to schedule him for an appt. Pt expressed understanding. An acute visit was scheduled for pt today, 10/17 at 4:30. Nothing further needed.

## 2017-11-27 NOTE — Progress Notes (Signed)
@Patient  ID: Jeff Graves, male    DOB: 1946/02/04, 72 y.o.   MRN: [id number]  Chief Complaint  Patient presents with  . Cough    with congestion    Referring provider: Noralee Space, MD  HPI 72 year old male with HTN and DDD followed by Dr. Lenna Gilford for primary care  OV 11/27/17 - acute chest congestion/cough Patient presents today with chest congestion and cough. Symptoms started 3 weeks ago. Symptoms started after a recent trip to [place] for vacation. Cough is productive of yellow sputum. Patient has taken OTC cold and cough medication with no relief noted. States that symptoms are progressively worsening. He denies any fever, chest pain, or edema.    No Known Allergies  Immunization History  Administered Date(s) Administered  . H1N1 01/21/2008  . Influenza Split 01/22/2011, 01/23/2012, 12/06/2012  . Influenza Whole 01/21/2007, 01/20/2009, 12/05/2009  . Influenza, High Dose Seasonal PF 10/30/2016  . Influenza,inj,Quad PF,6+ Mos 12/15/2013, 11/12/2014  . Pneumococcal Conjugate-13 04/15/2014  . Pneumococcal Polysaccharide-23 04/16/2016  . Tdap 01/23/2012  . Zoster Recombinat (Shingrix) 03/22/2017, 06/16/2017, 10/26/2017    Past Medical History:  Diagnosis Date  . Allergy    seasonal  . Atrial tachycardia ([place]) 06/04/2012   12/13-QRS duration-102 ms 6/14-QRS duration-98 ms   . DDD (degenerative disc disease)   . Diverticulosis of colon   . Hemorrhoids   . Hypercholesterolemia   . Hypertension   . PAC (premature atrial contraction)   . Renal calculus   . Shoulder pain   . Sinus bradycardia     Tobacco History: Social History   Tobacco Use  Smoking Status Former Smoker  . Packs/day: 0.50  . Years: 20.00  . Pack years: 10.00  . Types: Cigarettes  . Last attempt to quit: 02/12/1984  . Years since quitting: 33.8  Smokeless Tobacco Never Used   Counseling given: Not Answered   Outpatient Encounter Medications as of 11/27/2017  Medication Sig  . aspirin  EC 81 MG tablet Take 1 tablet (81 mg total) by mouth daily.  [name] atorvastatin (LIPITOR) 10 MG tablet TAKE 1 TABLET (10 MG TOTAL) BY MOUTH DAILY.  [name] Cholecalciferol (VITAMIN D) 2000 units CAPS Take 2,000 Units by mouth daily.  [name] loratadine (CLARITIN) 10 MG tablet Take 10 mg by mouth daily as needed.   [name] losartan (COZAAR) 100 MG tablet Take 1 tablet (100 mg total) by mouth daily. (Patient taking differently: Take 50 mg by mouth daily. )  . Multiple Vitamins-Minerals (MULTIVITAMIN WITH MINERALS) tablet Take 1 tablet by mouth daily.  . ranolazine (RANEXA) 500 MG 12 hr tablet Take 1 tablet (500 mg total) by mouth 2 (two) times daily.  [name] azithromycin (ZITHROMAX) 250 MG tablet Take 2 tablets (500 mg) on day 1, then take 1 tablet (250 mg) on days 2-5   No facility-administered encounter medications on file as of 11/27/2017.      Review of Systems  Review of Systems  Constitutional: Negative.  Negative for chills and fever.  HENT: Negative.   Respiratory: Positive for cough and shortness of breath.   Cardiovascular: Negative.  Negative for chest pain, palpitations and leg swelling.  Gastrointestinal: Negative.   Allergic/Immunologic: Negative.   Neurological: Negative.   Psychiatric/Behavioral: Negative.        Physical Exam  BP 128/88 (BP Location: Left Arm, Patient Position: Sitting, Cuff Size: Normal)   Pulse 68   Ht 5\' 9"  (1.753 m)   Wt 163 lb (73.9 kg)   SpO2 99%  BMI 24.07 kg/m   Wt Readings from Last 5 Encounters:  11/27/17 163 lb (73.9 kg)  07/17/17 160 lb (72.6 kg)  06/19/17 163 lb (73.9 kg)  04/16/17 161 lb 3.2 oz (73.1 kg)  06/13/16 165 lb (74.8 kg)     Physical Exam  Constitutional: He is oriented to person, place, and time. He appears well-developed and well-nourished. No distress.  Cardiovascular: Normal rate and regular rhythm.  Pulmonary/Chest: Effort normal and breath sounds normal. No respiratory distress. He has no wheezes.  Neurological: He is alert and  oriented to person, place, and time.  Skin: Skin is warm and dry.  Psychiatric: He has a normal mood and affect.  Nursing note and vitals reviewed.     Assessment & Plan:   Acute bronchitis Patient Instructions  Will order azithromycin May take mucinex May take delsym for cough May take Claritin daily Continue current medications Follow up if symptoms worsen       Fenton Foy, NP 11/27/2017

## 2017-11-27 NOTE — Assessment & Plan Note (Signed)
Patient Instructions  Will order azithromycin May take mucinex May take delsym for cough May take Claritin daily Continue current medications Follow up if symptoms worsen

## 2018-06-23 ENCOUNTER — Telehealth: Payer: Self-pay

## 2018-06-23 NOTE — Telephone Encounter (Signed)
I called and left patient a message about switching upcoming appointment on Monday 06/29/18 to telehealth visit.

## 2018-06-26 NOTE — Telephone Encounter (Signed)
Patient cancelled appointment on 06/29/18 and rescheduled for OV on 11/04/18.

## 2018-06-29 ENCOUNTER — Ambulatory Visit: Payer: PPO | Admitting: Internal Medicine

## 2018-07-17 ENCOUNTER — Other Ambulatory Visit: Payer: Self-pay | Admitting: Internal Medicine

## 2018-08-17 ENCOUNTER — Other Ambulatory Visit: Payer: Self-pay | Admitting: Pulmonary Disease

## 2018-11-04 ENCOUNTER — Other Ambulatory Visit: Payer: Self-pay

## 2018-11-04 ENCOUNTER — Ambulatory Visit (INDEPENDENT_AMBULATORY_CARE_PROVIDER_SITE_OTHER): Payer: PPO | Admitting: Internal Medicine

## 2018-11-04 ENCOUNTER — Encounter: Payer: Self-pay | Admitting: Internal Medicine

## 2018-11-04 VITALS — BP 124/74 | HR 53 | Ht 69.0 in | Wt 163.2 lb

## 2018-11-04 DIAGNOSIS — I25119 Atherosclerotic heart disease of native coronary artery with unspecified angina pectoris: Secondary | ICD-10-CM

## 2018-11-04 DIAGNOSIS — R001 Bradycardia, unspecified: Secondary | ICD-10-CM

## 2018-11-04 DIAGNOSIS — I10 Essential (primary) hypertension: Secondary | ICD-10-CM | POA: Diagnosis not present

## 2018-11-04 DIAGNOSIS — I471 Supraventricular tachycardia: Secondary | ICD-10-CM

## 2018-11-04 DIAGNOSIS — R0789 Other chest pain: Secondary | ICD-10-CM | POA: Diagnosis not present

## 2018-11-04 DIAGNOSIS — Z01812 Encounter for preprocedural laboratory examination: Secondary | ICD-10-CM | POA: Diagnosis not present

## 2018-11-04 DIAGNOSIS — R079 Chest pain, unspecified: Secondary | ICD-10-CM

## 2018-11-04 NOTE — Progress Notes (Signed)
Patient Care Team: Noralee Space, MD as PCP - General (Pulmonary Disease)   HPI  Jeff Graves is a 73 y.o. male Seen in followup for atrial tachycardia for which he took flecainide but now takes ranolazine  He also has a history of hypertension  Stress testing had demonstrated normal blood pressure response normal heart rate response to exercise; during the summer he is also noted episodes of short lasting chest discomfort  . It was unassociated with exertion.it lasted about a minute. Because of this we undertook Myoview scanning which is abnormal.  >>>>Nuclear stress EF: 50%.  There was no ST segment deviation noted during stress.  Defect 1: There is a small defect of mild severity present in the basal inferolateral and mid inferolateral location.  Defect 2: There is a small defect of moderate severity present in the apex location.  Findings consistent with ischemia.  This is an intermediate risk study.  The left ventricular ejection fraction is mildly decreased (45-54%).  This prompted [place] to stop his flecainide. We added amlodipine as a calcium blocker in the hopes that we would have antiarrhythmic benefit.  This was inadequate. We discussed next the use of ranolazine versus dronaderone. The former was initiated and he was seen 2 months later-4/17 doing quite well.  Complaints of exercise intolerance and inducible chest pain. Also assoc with heart rates of 120.  Date Cr K Hgb  3/19 1.28 4.3 13.7           Overall fatigue over the last couple years and dyspnea on exertion.  Past Medical History:  Diagnosis Date  . Allergy    seasonal  . Atrial tachycardia ([place]) 06/04/2012   12/13-QRS duration-102 ms 6/14-QRS duration-98 ms   . DDD (degenerative disc disease)   . Diverticulosis of colon   . Hemorrhoids   . Hypercholesterolemia   . Hypertension   . PAC (premature atrial contraction)   . Renal calculus   . Shoulder pain   . Sinus bradycardia     Past  Surgical History:  Procedure Laterality Date  . COLONOSCOPY    . None    . SHOULDER SURGERY Left 03/29/2014    Current Outpatient Medications  Medication Sig Dispense Refill  . aspirin EC 81 MG tablet Take 1 tablet (81 mg total) by mouth daily.    [name] atorvastatin (LIPITOR) 10 MG tablet TAKE 1 TABLET BY MOUTH DAILY 30 tablet 0  . azithromycin (ZITHROMAX) 250 MG tablet Take 2 tablets (500 mg) on day 1, then take 1 tablet (250 mg) on days 2-5 6 tablet 0  . Cholecalciferol (VITAMIN D) 2000 units CAPS Take 2,000 Units by mouth daily.    [name] loratadine (CLARITIN) 10 MG tablet Take 10 mg by mouth daily as needed.     [name] losartan (COZAAR) 50 MG tablet     . Multiple Vitamins-Minerals (MULTIVITAMIN WITH MINERALS) tablet Take 1 tablet by mouth daily.    . ranolazine (RANEXA) 500 MG 12 hr tablet Take 1 tablet (500 mg total) by mouth 2 (two) times daily. Please keep upcoming appt for future refills. Thank you 180 tablet 0   No current facility-administered medications for this visit.     No Known Allergies  Review of Systems negative except from HPI and PMH  Physical Exam BP 124/74   Pulse (!) 53   Ht 5\' 9"  (1.753 m)   Wt 163 lb 3.2 oz (74 kg)   SpO2 99%   BMI 24.10 kg/m  Well  developed and nourished in no acute distress HENT normal Neck supple with JVP-  flat   Clear Regular rate and rhythm, no murmurs or gallops Abd-soft with active BS No Clubbing cyanosis edema Skin-warm and dry A & Oriented  Grossly normal sensory and motor function  ECG sinus @ 53 17/09/41    Otherwise normal  Assessment and  Plan    Atrial tachycardia-flecainide stopped on ranolazine  Lightheadedness likely neurally mediated   Sinus bradycardia  Exertional chest pain  CAD w abnormal myoview 2014   Hypertension   The patient has exertional chest discomfort.  This is relieved by rest.  He has known coronary disease.  We discussed empiric therapy versus catheterization.  I do not think repeat  noninvasive testing would be particularly useful.  He is inclined towards catheterization.  He has seen Dr. Angelena Form in the past.  I have reviewed risks and benefits and we will schedule the procedure  For now, we will continue other medications including ranolazine both for ischemia as well as atrial tachycardia.  We spent more than 50% of our >25 min visit in face to face counseling regarding the above

## 2018-11-04 NOTE — Patient Instructions (Addendum)
Medication Instructions:  Your physician recommends that you continue on your current medications as directed. Please refer to the Current Medication list given to you today.  * If you need a refill on your cardiac medications before your next appointment, please call your pharmacy.   Labwork: Today: BMET & CBC If you have labs (blood work) drawn today and your tests are completely normal, you will receive your results only by:  [place] (if you have MyChart) OR  A paper copy in the mail If you have any lab test that is abnormal or we need to change your treatment, we will call you to review the results.  Testing/Procedures: Your physician has requested that you have a cardiac catheterization. Cardiac catheterization is used to diagnose and/or treat various heart conditions. Doctors may recommend this procedure for a number of different reasons. The most common reason is to evaluate chest pain. Chest pain can be a symptom of coronary artery disease (CAD), and cardiac catheterization can show whether plaque is narrowing or blocking your hearts arteries. This procedure is also used to evaluate the valves, as well as measure the blood flow and oxygen levels in different parts of your heart. For further information please visit [email or web address]. Please follow instruction sheet, as given.   Follow-Up: Your physician wants you to follow-up in: 1 year with Dr. Caryl Comes.  You will receive a reminder letter in the mail two months in advance. If you don't receive a letter, please call our office to schedule the follow-up appointment.   Thank you for choosing [facility]!!   Trinidad Curet, RN [phone]  Any Other Special Instructions Will Be Listed Below (If Applicable).   COVID TEST-- On 11/14/18 @ 12:45 pm - You will go to [facility] hospital ([place]) for your Covid testing.   This is a drive thru test site.  There will be multiple testing areas.  Be sure to  share with the first checkpoint that you are there for pre-procedure/surgery testing. This will put you into the right (yellow) lane that leads to the PAT testing team. Stay in your car and the nurse team will come to your car to test you.  After you are tested please go home and self quarantine until the day of your procedure.      CATHETERIZATION INSTRUCTIONS  You are scheduled for a Cardiac Catheterization on Wednesday, October 7 with Dr. Lauree Chandler.  1. Please arrive at the [facility] (Main Entrance A) at [facility]: [address] [place], [place] [address] at 6:30 AM (two hours before your procedure to ensure your preparation). Free valet parking service is available.   Special note: Every effort is made to have your procedure done on time. Please understand that emergencies sometimes delay scheduled procedures.  2. Diet: Do not eat or drink anything after midnight prior to your procedure except sips of water to take medications.  3. Labs: Will be drawn on 9/23 at your office visit with Dr. Caryl Comes  4. Medication instructions in preparation for your procedure:  On the morning of your procedure, take your Aspirin and any morning medicines NOT listed above.  You may use sips of water.  5. Plan for one night stay--bring personal belongings. 6. Bring a current list of your medications and current insurance cards. 7. You MUST have a responsible person to drive you home. 8. Someone MUST be with you the first 24 hours after you arrive home or your discharge will  be delayed. 9. Please wear clothes that are easy to get on and off and wear slip-on shoes.  Thank you for allowing [place] to care for you!   -- [facility]     Coronary Angiogram A coronary angiogram is an X-ray procedure that is used to examine the arteries in the heart. In this procedure, a dye (contrast dye) is injected through a long, thin tube (catheter). The catheter is  inserted through the groin, wrist, or arm. The dye is injected into each artery, then X-rays are taken to show if there is a blockage in the arteries of the heart. This procedure can also show if you have valve disease or a disease of the aorta, and it can be used to check the overall function of your heart muscle. You may have a coronary angiogram if:  You are having chest pain, or other symptoms of angina, and you are at risk for heart disease.  You have an abnormal electrocardiogram (ECG) or stress test.  You have chest pain and heart failure.  You are having irregular heart rhythms.  You and your health care provider determine that the benefits of the test information outweigh the risks of the procedure.  Let your health care provider know about:  Any allergies you have, including allergies to contrast dye.  All medicines you are taking, including vitamins, herbs, eye drops, creams, and over-the-counter medicines.  Any problems you or family members have had with anesthetic medicines.  Any blood disorders you have.  Any surgeries you have had.  History of kidney problems or kidney failure.  Any medical conditions you have.  Whether you are pregnant or may be pregnant. What are the risks? Generally, this is a safe procedure. However, problems may occur, including:  Infection.  Allergic reaction to medicines or dyes that are used.  Bleeding from the access site or other locations.  Kidney injury, especially in people with impaired kidney function.  Stroke (rare).  Heart attack (rare).  Damage to other structures or organs.  What happens before the procedure? Staying hydrated Follow instructions from your health care provider about hydration, which may include:  Up to 2 hours before the procedure - you may continue to drink clear liquids, such as water, clear fruit juice, black coffee, and plain tea.  Eating and drinking restrictions Follow instructions from your  health care provider about eating and drinking, which may include:  8 hours before the procedure - stop eating heavy meals or foods such as meat, fried foods, or fatty foods.  6 hours before the procedure - stop eating light meals or foods, such as toast or cereal.  2 hours before the procedure - stop drinking clear liquids.  General instructions  Ask your health care provider about: ? Changing or stopping your regular medicines. This is especially important if you are taking diabetes medicines or blood thinners. ? Taking medicines such as ibuprofen. These medicines can thin your blood. Do not take these medicines before your procedure if your health care provider instructs you not to, though aspirin may be recommended prior to coronary angiograms.  Plan to have someone take you home from the hospital or clinic.  You may need to have blood tests or X-rays done. What happens during the procedure?  An IV tube will be inserted into one of your veins.  You will be given one or more of the following: ? A medicine to help you relax (sedative). ? A medicine to  numb the area where the catheter will be inserted into an artery (local anesthetic).  To reduce your risk of infection: ? Your health care team will wash or sanitize their hands. ? Your skin will be washed with soap. ? Hair may be removed from the area where the catheter will be inserted.  You will be connected to a continuous ECG monitor.  The catheter will be inserted into an artery. The location may be in your groin, in your wrist, or in the fold of your arm (near your elbow).  A type of X-ray (fluoroscopy) will be used to help guide the catheter to the opening of the blood vessel that is being examined.  A dye will be injected into the catheter, and X-rays will be taken. The dye will help to show where any narrowing or blockages are located in the heart arteries.  Tell your health care provider if you have any chest pain or  trouble breathing during the procedure.  If blockages are found, your health care provider may perform another procedure, such as inserting a coronary stent. The procedure may vary among health care providers and hospitals. What happens after the procedure?  After the procedure, you will need to keep the area still for a few hours, or for as long as told by your health care provider. If the procedure is done through the groin, you will be instructed to not bend and not cross your legs.  The insertion site will be checked frequently.  The pulse in your foot or wrist will be checked frequently.  You may have additional blood tests, X-rays, and a test that records the electrical activity of your heart (ECG).  Do not drive for 24 hours if you were given a sedative. Summary  A coronary angiogram is an X-ray procedure that is used to look into the arteries in the heart.  During the procedure, a dye (contrast dye) is injected through a long, thin tube (catheter). The catheter is inserted through the groin, wrist, or arm.  Tell your health care provider about any allergies you have, including allergies to contrast dye.  After the procedure, you will need to keep the area still for a few hours, or for as long as told by your health care provider. This information is not intended to replace advice given to you by your health care provider. Make sure you discuss any questions you have with your health care provider. Document Released: 08/04/2002 Document Revised: 11/10/2015 Document Reviewed: 11/10/2015 Elsevier Interactive Patient Education  [employer].

## 2018-11-04 NOTE — H&P (View-Only) (Signed)
Patient Care Team: Noralee Space, MD as PCP - General (Pulmonary Disease)   HPI  Jeff Graves is a 73 y.o. male Seen in followup for atrial tachycardia for which he took flecainide but now takes ranolazine  He also has a history of hypertension  Stress testing had demonstrated normal blood pressure response normal heart rate response to exercise; during the summer he is also noted episodes of short lasting chest discomfort  . It was unassociated with exertion.it lasted about a minute. Because of this we undertook Myoview scanning which is abnormal.  >>>>Nuclear stress EF: 50%.  There was no ST segment deviation noted during stress.  Defect 1: There is a small defect of mild severity present in the basal inferolateral and mid inferolateral location.  Defect 2: There is a small defect of moderate severity present in the apex location.  Findings consistent with ischemia.  This is an intermediate risk study.  The left ventricular ejection fraction is mildly decreased (45-54%).  This prompted [place] to stop his flecainide. We added amlodipine as a calcium blocker in the hopes that we would have antiarrhythmic benefit.  This was inadequate. We discussed next the use of ranolazine versus dronaderone. The former was initiated and he was seen 2 months later-4/17 doing quite well.  Complaints of exercise intolerance and inducible chest pain. Also assoc with heart rates of 120.  Date Cr K Hgb  3/19 1.28 4.3 13.7           Overall fatigue over the last couple years and dyspnea on exertion.  Past Medical History:  Diagnosis Date  . Allergy    seasonal  . Atrial tachycardia ([place]) 06/04/2012   12/13-QRS duration-102 ms 6/14-QRS duration-98 ms   . DDD (degenerative disc disease)   . Diverticulosis of colon   . Hemorrhoids   . Hypercholesterolemia   . Hypertension   . PAC (premature atrial contraction)   . Renal calculus   . Shoulder pain   . Sinus bradycardia     Past  Surgical History:  Procedure Laterality Date  . COLONOSCOPY    . None    . SHOULDER SURGERY Left 03/29/2014    Current Outpatient Medications  Medication Sig Dispense Refill  . aspirin EC 81 MG tablet Take 1 tablet (81 mg total) by mouth daily.    [name] atorvastatin (LIPITOR) 10 MG tablet TAKE 1 TABLET BY MOUTH DAILY 30 tablet 0  . azithromycin (ZITHROMAX) 250 MG tablet Take 2 tablets (500 mg) on day 1, then take 1 tablet (250 mg) on days 2-5 6 tablet 0  . Cholecalciferol (VITAMIN D) 2000 units CAPS Take 2,000 Units by mouth daily.    [name] loratadine (CLARITIN) 10 MG tablet Take 10 mg by mouth daily as needed.     [name] losartan (COZAAR) 50 MG tablet     . Multiple Vitamins-Minerals (MULTIVITAMIN WITH MINERALS) tablet Take 1 tablet by mouth daily.    . ranolazine (RANEXA) 500 MG 12 hr tablet Take 1 tablet (500 mg total) by mouth 2 (two) times daily. Please keep upcoming appt for future refills. Thank you 180 tablet 0   No current facility-administered medications for this visit.     No Known Allergies  Review of Systems negative except from HPI and PMH  Physical Exam BP 124/74   Pulse (!) 53   Ht 5\' 9"  (1.753 m)   Wt 163 lb 3.2 oz (74 kg)   SpO2 99%   BMI 24.10 kg/m  Well  developed and nourished in no acute distress HENT normal Neck supple with JVP-  flat   Clear Regular rate and rhythm, no murmurs or gallops Abd-soft with active BS No Clubbing cyanosis edema Skin-warm and dry A & Oriented  Grossly normal sensory and motor function  ECG sinus @ 53 17/09/41    Otherwise normal  Assessment and  Plan    Atrial tachycardia-flecainide stopped on ranolazine  Lightheadedness likely neurally mediated   Sinus bradycardia  Exertional chest pain  CAD w abnormal myoview 2014   Hypertension   The patient has exertional chest discomfort.  This is relieved by rest.  He has known coronary disease.  We discussed empiric therapy versus catheterization.  I do not think repeat  noninvasive testing would be particularly useful.  He is inclined towards catheterization.  He has seen Dr. Angelena Form in the past.  I have reviewed risks and benefits and we will schedule the procedure  For now, we will continue other medications including ranolazine both for ischemia as well as atrial tachycardia.  We spent more than 50% of our >25 min visit in face to face counseling regarding the above

## 2018-11-05 LAB — CBC
Hematocrit: 38.2 % (ref 37.5–51.0)
Hemoglobin: 12.9 g/dL — ABNORMAL LOW (ref 13.0–17.7)
MCH: 32 pg (ref 26.6–33.0)
MCHC: 33.8 g/dL (ref 31.5–35.7)
MCV: 95 fL (ref 79–97)
Platelets: 278 10*3/uL (ref 150–450)
RBC: 4.03 x10E6/uL — ABNORMAL LOW (ref 4.14–5.80)
RDW: 12.8 % (ref 11.6–15.4)
WBC: 6 10*3/uL (ref 3.4–10.8)

## 2018-11-05 LAB — BASIC METABOLIC PANEL
BUN/Creatinine Ratio: 11 (ref 10–24)
BUN: 15 mg/dL (ref 8–27)
CO2: 24 mmol/L (ref 20–29)
Calcium: 9.3 mg/dL (ref 8.6–10.2)
Chloride: 100 mmol/L (ref 96–106)
Creatinine, Ser: 1.32 mg/dL — ABNORMAL HIGH (ref 0.76–1.27)
GFR calc Af Amer: 62 mL/min/{1.73_m2} (ref >59)
GFR calc non Af Amer: 54 mL/min/{1.73_m2} — ABNORMAL LOW (ref >59)
Glucose: 89 mg/dL (ref 65–99)
Potassium: 4.8 mmol/L (ref 3.5–5.2)
Sodium: 135 mmol/L (ref 134–144)

## 2018-11-10 ENCOUNTER — Ambulatory Visit: Payer: PPO | Admitting: Family Medicine

## 2018-11-14 ENCOUNTER — Other Ambulatory Visit (HOSPITAL_COMMUNITY)
Admission: RE | Admit: 2018-11-14 | Discharge: 2018-11-14 | Disposition: A | Discharge status: Home / Self Care | Payer: PPO | Source: Ambulatory Visit | Attending: Cardiovascular Disease | Admitting: Cardiovascular Disease

## 2018-11-14 DIAGNOSIS — Z01812 Encounter for preprocedural laboratory examination: Secondary | ICD-10-CM | POA: Diagnosis not present

## 2018-11-14 DIAGNOSIS — Z20828 Contact with and (suspected) exposure to other viral communicable diseases: Secondary | ICD-10-CM | POA: Diagnosis not present

## 2018-11-16 LAB — NOVEL CORONAVIRUS, NAA (HOSP ORDER, SEND-OUT TO REF LAB; TAT 18-24 HRS): SARS-CoV-2, NAA: NOT DETECTED

## 2018-11-17 ENCOUNTER — Telehealth: Payer: Self-pay | Admitting: *Deleted

## 2018-11-17 NOTE — Telephone Encounter (Signed)
Pt contacted pre-catheterization scheduled at [facility] for: Wednesday November 18, 2018 8:30 AM Verified arrival time and place: [facility]) at: 6:30 AM  No solid food after midnight prior to cath, clear liquids until 5 AM day of procedure. Contrast allergy: no   AM meds can be  taken pre-cath with sip of water including: ASA 81 mg   Confirmed patient has responsible adult to drive home post procedure and observe 24 hours after arriving home: yes  Currently, due to Covid-19 pandemic, only one support person will be allowed with patient. Must be the same support person for that patient's entire stay, will be screened and required to wear a mask. They will be asked to wait in the waiting room for the duration of the patient's stay.  Patients are required to wear a mask when they enter the hospital.      COVID-19 Pre-Screening Questions:  . In the past 7 to 10 days have you had a cough,  shortness of breath, headache, congestion, fever (100 or greater) body aches, chills, sore throat, or sudden loss of taste or sense of smell? no . Have you been around anyone with known Covid 19? no . Have you been around anyone who is awaiting Covid 19 test results in the past 7 to 10 days? no . Have you been around anyone who has been exposed to Covid 19, or has mentioned symptoms of Covid 19 within the past 7 to 10 days? no  I reviewed procedure/mask/visitor instructions, Covid-19 screening questions with patient, he verbalized understanding, thanked me for call.

## 2018-11-18 ENCOUNTER — Other Ambulatory Visit: Payer: Self-pay

## 2018-11-18 ENCOUNTER — Inpatient Hospital Stay (HOSPITAL_COMMUNITY): Payer: PPO

## 2018-11-18 ENCOUNTER — Inpatient Hospital Stay (HOSPITAL_COMMUNITY)
Admission: RE | Disposition: A | Discharge status: Home / Self Care | Payer: Self-pay | Source: Home / Self Care | Attending: Internal Medicine

## 2018-11-18 ENCOUNTER — Other Ambulatory Visit: Payer: Self-pay | Admitting: *Deleted

## 2018-11-18 ENCOUNTER — Inpatient Hospital Stay (HOSPITAL_COMMUNITY)
Admission: RE | Admit: 2018-11-18 | Discharge: 2018-11-28 | DRG: 233 | Disposition: A | Discharge status: Home / Self Care | Payer: PPO | Attending: Cardiothoracic Surgery | Admitting: Cardiothoracic Surgery

## 2018-11-18 DIAGNOSIS — I480 Paroxysmal atrial fibrillation: Secondary | ICD-10-CM | POA: Diagnosis present

## 2018-11-18 DIAGNOSIS — I213 ST elevation (STEMI) myocardial infarction of unspecified site: Secondary | ICD-10-CM | POA: Diagnosis present

## 2018-11-18 DIAGNOSIS — I10 Essential (primary) hypertension: Secondary | ICD-10-CM | POA: Diagnosis present

## 2018-11-18 DIAGNOSIS — N183 Chronic kidney disease, stage 3 unspecified: Secondary | ICD-10-CM | POA: Diagnosis not present

## 2018-11-18 DIAGNOSIS — Z8249 Family history of ischemic heart disease and other diseases of the circulatory system: Secondary | ICD-10-CM | POA: Diagnosis not present

## 2018-11-18 DIAGNOSIS — I080 Rheumatic disorders of both mitral and aortic valves: Secondary | ICD-10-CM | POA: Diagnosis not present

## 2018-11-18 DIAGNOSIS — I340 Nonrheumatic mitral (valve) insufficiency: Secondary | ICD-10-CM | POA: Diagnosis not present

## 2018-11-18 DIAGNOSIS — R001 Bradycardia, unspecified: Secondary | ICD-10-CM | POA: Diagnosis not present

## 2018-11-18 DIAGNOSIS — J939 Pneumothorax, unspecified: Secondary | ICD-10-CM | POA: Diagnosis not present

## 2018-11-18 DIAGNOSIS — I2510 Atherosclerotic heart disease of native coronary artery without angina pectoris: Secondary | ICD-10-CM

## 2018-11-18 DIAGNOSIS — R079 Chest pain, unspecified: Secondary | ICD-10-CM

## 2018-11-18 DIAGNOSIS — Z792 Long term (current) use of antibiotics: Secondary | ICD-10-CM

## 2018-11-18 DIAGNOSIS — Z951 Presence of aortocoronary bypass graft: Secondary | ICD-10-CM | POA: Diagnosis not present

## 2018-11-18 DIAGNOSIS — J90 Pleural effusion, not elsewhere classified: Secondary | ICD-10-CM | POA: Diagnosis not present

## 2018-11-18 DIAGNOSIS — Z20828 Contact with and (suspected) exposure to other viral communicable diseases: Secondary | ICD-10-CM | POA: Diagnosis not present

## 2018-11-18 DIAGNOSIS — E785 Hyperlipidemia, unspecified: Secondary | ICD-10-CM | POA: Diagnosis not present

## 2018-11-18 DIAGNOSIS — I25110 Atherosclerotic heart disease of native coronary artery with unstable angina pectoris: Secondary | ICD-10-CM | POA: Diagnosis not present

## 2018-11-18 DIAGNOSIS — I471 Supraventricular tachycardia: Secondary | ICD-10-CM | POA: Diagnosis not present

## 2018-11-18 DIAGNOSIS — M542 Cervicalgia: Secondary | ICD-10-CM | POA: Diagnosis present

## 2018-11-18 DIAGNOSIS — R42 Dizziness and giddiness: Secondary | ICD-10-CM | POA: Diagnosis not present

## 2018-11-18 DIAGNOSIS — Z23 Encounter for immunization: Secondary | ICD-10-CM | POA: Diagnosis not present

## 2018-11-18 DIAGNOSIS — J9811 Atelectasis: Secondary | ICD-10-CM | POA: Diagnosis not present

## 2018-11-18 DIAGNOSIS — Z79899 Other long term (current) drug therapy: Secondary | ICD-10-CM | POA: Diagnosis not present

## 2018-11-18 DIAGNOSIS — I129 Hypertensive chronic kidney disease with stage 1 through stage 4 chronic kidney disease, or unspecified chronic kidney disease: Secondary | ICD-10-CM | POA: Diagnosis not present

## 2018-11-18 DIAGNOSIS — Z87891 Personal history of nicotine dependence: Secondary | ICD-10-CM | POA: Diagnosis not present

## 2018-11-18 DIAGNOSIS — K5730 Diverticulosis of large intestine without perforation or abscess without bleeding: Secondary | ICD-10-CM | POA: Diagnosis not present

## 2018-11-18 DIAGNOSIS — Z7982 Long term (current) use of aspirin: Secondary | ICD-10-CM | POA: Diagnosis not present

## 2018-11-18 DIAGNOSIS — E7800 Pure hypercholesterolemia, unspecified: Secondary | ICD-10-CM | POA: Diagnosis present

## 2018-11-18 DIAGNOSIS — I25119 Atherosclerotic heart disease of native coronary artery with unspecified angina pectoris: Secondary | ICD-10-CM

## 2018-11-18 DIAGNOSIS — J209 Acute bronchitis, unspecified: Secondary | ICD-10-CM | POA: Diagnosis not present

## 2018-11-18 DIAGNOSIS — I351 Nonrheumatic aortic (valve) insufficiency: Secondary | ICD-10-CM

## 2018-11-18 DIAGNOSIS — I200 Unstable angina: Secondary | ICD-10-CM | POA: Diagnosis not present

## 2018-11-18 DIAGNOSIS — Z01818 Encounter for other preprocedural examination: Secondary | ICD-10-CM | POA: Diagnosis not present

## 2018-11-18 DIAGNOSIS — N1831 Chronic kidney disease, stage 3a: Secondary | ICD-10-CM | POA: Diagnosis not present

## 2018-11-18 DIAGNOSIS — Z09 Encounter for follow-up examination after completed treatment for conditions other than malignant neoplasm: Secondary | ICD-10-CM

## 2018-11-18 DIAGNOSIS — Z4682 Encounter for fitting and adjustment of non-vascular catheter: Secondary | ICD-10-CM | POA: Diagnosis not present

## 2018-11-18 DIAGNOSIS — Z01810 Encounter for preprocedural cardiovascular examination: Secondary | ICD-10-CM | POA: Diagnosis not present

## 2018-11-18 DIAGNOSIS — I4719 Other supraventricular tachycardia: Secondary | ICD-10-CM | POA: Diagnosis present

## 2018-11-18 DIAGNOSIS — I371 Nonrheumatic pulmonary valve insufficiency: Secondary | ICD-10-CM | POA: Diagnosis not present

## 2018-11-18 HISTORY — PX: LEFT HEART CATH AND CORONARY ANGIOGRAPHY: CATH118249

## 2018-11-18 LAB — ECHOCARDIOGRAM COMPLETE
Height: 70 in
Weight: 2515.2 oz

## 2018-11-18 SURGERY — LEFT HEART CATH AND CORONARY ANGIOGRAPHY
Anesthesia: LOCAL

## 2018-11-18 MED ORDER — SODIUM CHLORIDE 0.9 % IV SOLN: 250.0000 mL | INTRAVENOUS | Status: DC | PRN

## 2018-11-18 MED ORDER — SODIUM CHLORIDE 0.9 % WEIGHT BASED INFUSION: 1.0000 mL/kg/h | INTRAVENOUS | Status: DC

## 2018-11-18 MED ORDER — HEPARIN SODIUM (PORCINE) 1000 UNIT/ML IJ SOLN
INTRAMUSCULAR | Status: AC
  Filled 2018-11-18: qty 1

## 2018-11-18 MED ORDER — HYDRALAZINE HCL 20 MG/ML IJ SOLN: 10.0000 mg | INTRAMUSCULAR | Status: AC | PRN

## 2018-11-18 MED ORDER — FENTANYL CITRATE (PF) 100 MCG/2ML IJ SOLN
INTRAMUSCULAR | Status: AC
  Filled 2018-11-18: qty 2

## 2018-11-18 MED ORDER — SODIUM CHLORIDE 0.9% FLUSH: 3.0000 mL | INTRAVENOUS | Status: DC | PRN

## 2018-11-18 MED ORDER — VERAPAMIL HCL 2.5 MG/ML IV SOLN
INTRAVENOUS | Status: DC | PRN
  Administered 2018-11-18: 10:00:00 10 mL via INTRA_ARTERIAL

## 2018-11-18 MED ORDER — SODIUM CHLORIDE 0.9% FLUSH
3.0000 mL | Freq: Two times a day (BID) | INTRAVENOUS | Status: DC
  Administered 2018-11-18 – 2018-11-22 (×8): 3 mL via INTRAVENOUS

## 2018-11-18 MED ORDER — SODIUM CHLORIDE 0.9% FLUSH
3.0000 mL | Freq: Two times a day (BID) | INTRAVENOUS | Status: DC
  Administered 2018-11-18 – 2018-11-22 (×6): 3 mL via INTRAVENOUS

## 2018-11-18 MED ORDER — VERAPAMIL HCL 2.5 MG/ML IV SOLN
INTRAVENOUS | Status: AC
  Filled 2018-11-18: qty 2

## 2018-11-18 MED ORDER — FENTANYL CITRATE (PF) 100 MCG/2ML IJ SOLN
INTRAMUSCULAR | Status: DC | PRN
  Administered 2018-11-18 (×2): 25 ug via INTRAVENOUS

## 2018-11-18 MED ORDER — ONDANSETRON HCL 4 MG/2ML IJ SOLN: 4.0000 mg | Freq: Four times a day (QID) | INTRAMUSCULAR | Status: DC | PRN

## 2018-11-18 MED ORDER — RANOLAZINE ER 500 MG PO TB12
500.0000 mg | ORAL_TABLET | Freq: Two times a day (BID) | ORAL | Status: DC
  Administered 2018-11-18 – 2018-11-22 (×9): 500 mg via ORAL
  Filled 2018-11-18 (×9): qty 1

## 2018-11-18 MED ORDER — MIDAZOLAM HCL 2 MG/2ML IJ SOLN
INTRAMUSCULAR | Status: DC | PRN
  Administered 2018-11-18 (×2): 1 mg via INTRAVENOUS

## 2018-11-18 MED ORDER — IOHEXOL 350 MG/ML SOLN
INTRAVENOUS | Status: DC | PRN
  Administered 2018-11-18: 11:00:00 125 mL

## 2018-11-18 MED ORDER — HEPARIN (PORCINE) IN NACL 1000-0.9 UT/500ML-% IV SOLN
INTRAVENOUS | Status: AC
  Filled 2018-11-18: qty 1000

## 2018-11-18 MED ORDER — INFLUENZA VAC A&B SA ADJ QUAD 0.5 ML IM PRSY
0.5000 mL | PREFILLED_SYRINGE | INTRAMUSCULAR | Status: DC
  Filled 2018-11-18: qty 0.5

## 2018-11-18 MED ORDER — SODIUM CHLORIDE 0.9 % IV SOLN
INTRAVENOUS | Status: AC
  Administered 2018-11-18: 11:00:00 via INTRAVENOUS

## 2018-11-18 MED ORDER — LOSARTAN POTASSIUM 50 MG PO TABS
50.0000 mg | ORAL_TABLET | Freq: Every day | ORAL | Status: DC
  Administered 2018-11-19 – 2018-11-22 (×4): 50 mg via ORAL
  Filled 2018-11-18 (×4): qty 1

## 2018-11-18 MED ORDER — ATORVASTATIN CALCIUM 10 MG PO TABS
10.0000 mg | ORAL_TABLET | Freq: Every day | ORAL | Status: DC
  Administered 2018-11-18 – 2018-11-19 (×2): 10 mg via ORAL
  Filled 2018-11-18 (×2): qty 1

## 2018-11-18 MED ORDER — LIDOCAINE HCL (PF) 1 % IJ SOLN
INTRAMUSCULAR | Status: AC
  Filled 2018-11-18: qty 30

## 2018-11-18 MED ORDER — MIDAZOLAM HCL 2 MG/2ML IJ SOLN
INTRAMUSCULAR | Status: AC
  Filled 2018-11-18: qty 2

## 2018-11-18 MED ORDER — HEPARIN (PORCINE) IN NACL 1000-0.9 UT/500ML-% IV SOLN
INTRAVENOUS | Status: DC | PRN
  Administered 2018-11-18 (×2): 500 mL

## 2018-11-18 MED ORDER — SODIUM CHLORIDE 0.9 % WEIGHT BASED INFUSION
3.0000 mL/kg/h | INTRAVENOUS | Status: DC
  Administered 2018-11-18: 3 mL/kg/h via INTRAVENOUS

## 2018-11-18 MED ORDER — LIDOCAINE HCL (PF) 1 % IJ SOLN
INTRAMUSCULAR | Status: DC | PRN
  Administered 2018-11-18: 10 mL
  Administered 2018-11-18: 2 mL

## 2018-11-18 MED ORDER — ASPIRIN EC 81 MG PO TBEC
81.0000 mg | DELAYED_RELEASE_TABLET | Freq: Every day | ORAL | Status: DC
  Administered 2018-11-19 – 2018-11-22 (×4): 81 mg via ORAL
  Filled 2018-11-18 (×4): qty 1

## 2018-11-18 MED ORDER — ACETAMINOPHEN 325 MG PO TABS: 650.0000 mg | ORAL_TABLET | ORAL | Status: DC | PRN

## 2018-11-18 MED ORDER — ASPIRIN 81 MG PO CHEW: 81.0000 mg | CHEWABLE_TABLET | ORAL | Status: DC

## 2018-11-18 MED ORDER — LABETALOL HCL 5 MG/ML IV SOLN: 10.0000 mg | INTRAVENOUS | Status: AC | PRN

## 2018-11-18 SURGICAL SUPPLY — 20 items
CATH 5FR JL3.5 JR4 ANG PIG MP (CATHETERS) ×1 IMPLANT
CATH INFINITI 5FR AL1 (CATHETERS) ×1 IMPLANT
CATH INFINITI 5FR JL4 (CATHETERS) ×1 IMPLANT
CATH VISTA GUIDE 6FR XBLAD3.5 (CATHETERS) ×1 IMPLANT
CLOSURE MYNX CONTROL 6F/7F (Vascular Products) ×1 IMPLANT
DEVICE RAD COMP TR BAND LRG (VASCULAR PRODUCTS) ×1 IMPLANT
GLIDESHEATH SLEND SS 6F .021 (SHEATH) ×1 IMPLANT
GUIDEWIRE INQWIRE 1.5J.035X260 (WIRE) IMPLANT
INQWIRE 1.5J .035X260CM (WIRE) ×2
KIT HEART LEFT (KITS) ×2 IMPLANT
PACK CARDIAC CATHETERIZATION (CUSTOM PROCEDURE TRAY) ×2 IMPLANT
SHEATH BRITE TIP 6FR 35CM (SHEATH) ×1 IMPLANT
SHEATH PINNACLE 5F 10CM (SHEATH) ×1 IMPLANT
SHEATH PINNACLE 6F 10CM (SHEATH) ×1 IMPLANT
SHEATH PROBE COVER 6X72 (BAG) ×1 IMPLANT
TRANSDUCER W/STOPCOCK (MISCELLANEOUS) ×2 IMPLANT
TUBING CIL FLEX 10 FLL-RA (TUBING) ×2 IMPLANT
WIRE EMERALD 3MM-J .035X150CM (WIRE) ×1 IMPLANT
WIRE EMERALD 3MM-J .035X260CM (WIRE) ×1 IMPLANT
WIRE HI TORQ VERSACORE-J 145CM (WIRE) ×1 IMPLANT

## 2018-11-18 NOTE — Interval H&P Note (Signed)
History and Physical Interval Note:  11/18/2018 9:24 AM  Jeff Graves  has presented today for surgery, with the diagnosis of Chest Pain.  The various methods of treatment have been discussed with the patient and family. After consideration of risks, benefits and other options for treatment, the patient has consented to  Procedure(s): LEFT HEART CATH AND CORONARY ANGIOGRAPHY (N/A) as a surgical intervention.  The patient's history has been reviewed, patient examined, no change in status, stable for surgery.  I have reviewed the patient's chart and labs.  Questions were answered to the patient's satisfaction.    Cath Lab Visit (complete for each Cath Lab visit)  Clinical Evaluation Leading to the Procedure:   ACS: No.  Non-ACS:    Anginal Classification: CCS III  Anti-ischemic medical therapy: Minimal Therapy (1 class of medications)  Non-Invasive Test Results: Intermediate-risk stress test findings: cardiac mortality 1-3%/year  Prior CABG: No previous CABG         Lauree Chandler

## 2018-11-18 NOTE — Consult Note (Addendum)
[address]       [place] [address]             [phone]        Giovanne L Deshazer [facility] Medical Record [id number] Date of Birth: May 07, 1945  Referring: Dr. Angelena Form Primary Care: No primary care provider on file. Primary Cardiologist:Dr Caryl Comes   Chief Complaint:  Chest pain   History of Present Illness:      Mr. Lescarbeau is a 73 year old male patient with a past medical history significant for hyperlipidemia, essential hypertension, atrial tachycardia, diverticulosis of the colon without hemorrhage, cervical disc disorder, BPH with obstruction, and history of rotator cuff surgery that presents today with exertional chest pain.  This pain has been present for about a year but would go away with rest.  Chest pain only occurred while patient was at the gym exerting himself.  The patient went to see Dr. Caryl Comes for a routine appointment on 11/04/2018 for his atrial tachycardia.  His flecainide was stopped and he continued on ranolazine.  At this time his chest pain had become more severe and did not resolve as quickly.  He did have known coronary artery disease.  He was referred to Dr. Angelena Form for cardiac catheterization.  On 11/18/2018 Mr. Janann Colonel underwent a cardiac catheterization which showed 40% stenosis of the proximal to distal RCA, distal RCA stenosis of 60%, 80% stenosis of the proximal circumflex, 99% stenosis of the ostial to proximal LAD, and proximal to mid LAD stenosis of 80%.  With severe triple-vessel coronary artery disease and heavily calcified vessels was recommended that coronary artery bypass grafting would be  the best treatment.   Of note the patient is extremely active going to the gym several days a week.  He never had any chest pain at rest only when he was exerting himself.  The patient still is employed and works with heating and cooling units.  Current Activity/ Functional Status: Patient was independent with mobility/ambulation, transfers,  ADL's, IADL's.   Zubrod Score: At the time of surgery this patient's most appropriate activity status/level should be described as: []     0    Normal activity, no symptoms [x]     1    Restricted in physical strenuous activity but ambulatory, able to do out light work []     2    Ambulatory and capable of self care, unable to do work activities, up and about                 more than 50%  Of the time                            []     3    Only limited self care, in bed greater than 50% of waking hours []     4    Completely disabled, no self care, confined to bed or chair []     5    Moribund  Past Medical History:  Diagnosis Date  . Allergy    seasonal  . Atrial tachycardia ([place]) 06/04/2012   12/13-QRS duration-102 ms 6/14-QRS duration-98 ms   . DDD (degenerative disc disease)   . Diverticulosis of colon   . Hemorrhoids   . Hypercholesterolemia   . Hypertension   . PAC (premature atrial contraction)   . Renal calculus   . Shoulder pain   . Sinus bradycardia     Past Surgical History:  Procedure Laterality Date  . COLONOSCOPY    . None    . SHOULDER SURGERY Left 03/29/2014    Social History   Tobacco Use  Smoking Status Former Smoker  . Packs/day: 0.50  . Years: 20.00  . Pack years: 10.00  . Types: Cigarettes  . Quit date: 02/12/1984  . Years since quitting: 34.7  Smokeless Tobacco Never Used    Social History   Substance and Sexual Activity  Alcohol Use No  . Alcohol/week: 0.0 standard drinks   Comment: social use     No Known Allergies  Current Facility-Administered Medications  Medication Dose Route Frequency Provider Last Rate Last Dose  . 0.9 %  sodium chloride infusion  250 mL Intravenous PRN [name], MD      . 0.9 %  sodium chloride infusion   Intravenous Continuous Burnell Blanks, MD      . 0.9% sodium chloride infusion  1 mL/kg/hr Intravenous Continuous [name], MD      . Derrill Memo ON 11/19/2018] aspirin chewable tablet 81 mg  81 mg  Oral Sela Hilding, MD      . hydrALAZINE (APRESOLINE) injection 10 mg  10 mg Intravenous Q20 Min PRN Burnell Blanks, MD      . labetalol (NORMODYNE) injection 10 mg  10 mg Intravenous Q10 min PRN Burnell Blanks, MD      . sodium chloride flush (NS) 0.9 % injection 3 mL  3 mL Intravenous Q12H [name], MD      . sodium chloride flush (NS) 0.9 % injection 3 mL  3 mL Intravenous PRN [name], MD        Medications Prior to Admission  Medication Sig Dispense Refill Last Dose  . aspirin EC 81 MG tablet Take 1 tablet (81 mg total) by mouth daily.   11/18/2018 at 0500  . atorvastatin (LIPITOR) 10 MG tablet TAKE 1 TABLET BY MOUTH DAILY (Patient taking differently: Take 10 mg by mouth daily at 6 PM. ) 30 tablet 0 11/17/2018 at 2200  . Cholecalciferol (VITAMIN D) 2000 units CAPS Take 2,000 Units by mouth daily.   11/17/2018 at 0800  . losartan (COZAAR) 50 MG tablet Take 50 mg by mouth daily.    11/17/2018 at 0800  . Multiple Vitamins-Minerals (MULTIVITAMIN WITH MINERALS) tablet Take 1 tablet by mouth daily.   11/17/2018 at 0800  . ranolazine (RANEXA) 500 MG 12 hr tablet Take 1 tablet (500 mg total) by mouth 2 (two) times daily. Please keep upcoming appt for future refills. Thank you (Patient taking differently: Take 500 mg by mouth 2 (two) times daily. ) 180 tablet 0 11/17/2018 at 2200  . azithromycin (ZITHROMAX) 250 MG tablet Take 2 tablets (500 mg) on day 1, then take 1 tablet (250 mg) on days 2-5 (Patient not taking: Reported on 11/16/2018) 6 tablet 0 Not Taking at Unknown time  . loratadine (CLARITIN) 10 MG tablet Take 10 mg by mouth daily as needed for allergies.    More than a month at Unknown time    Family History  Problem Relation Age of Onset  . Heart attack Father 44  . Valvular heart disease Mother 1  . Lymphoma Sister        Deceased  . Breast cancer Sister        Alive  . Colon cancer Neg Hx   . Esophageal cancer Neg Hx   . Rectal cancer Neg Hx    .  Stomach cancer Neg Hx      Review of Systems:   Review of Systems  Respiratory: Negative for shortness of breath.   Cardiovascular: Positive for chest pain (exertional). Negative for leg swelling.  Gastrointestinal: Negative for abdominal pain, diarrhea, heartburn, nausea and vomiting.  Musculoskeletal: Negative for back pain and neck pain.  Psychiatric/Behavioral: Negative for depression. The patient is not nervous/anxious.    Pertinent items are noted in HPI.       Physical Exam: BP (!) 159/96   Pulse (!) 49   Temp (!) 97.3 F (36.3 C) (Skin)   Resp 19   Ht 5\' 10"  (1.778 m)   Wt 72.6 kg   SpO2 100%   BMI 22.96 kg/m    General appearance: alert, cooperative and no distress Resp: clear to auscultation bilaterally Cardio: regular rate and rhythm, S1, S2 normal, no murmur, click, rub or gallop GI: soft, non-tender; bowel sounds normal; no masses,  no organomegaly Extremities: extremities normal, atraumatic, no cyanosis or edema, good pulses in bilateral lower extremity.  Neurologic: Grossly normal Lower extremities appear to have adequate vein for bypass  palpable dp and pt pulses bilateral  Diagnostic Studies & Laboratory data:  Cardiac Cath 11/18/2018   Prox RCA to Dist RCA lesion is 40% stenosed.  Dist RCA lesion is 60% stenosed.  RPAV lesion is 95% stenosed.  Prox Cx lesion is 80% stenosed.  Ost LAD to Prox LAD lesion is 99% stenosed.  Prox LAD to Mid LAD lesion is 80% stenosed.   1. Severe triple vessel CAD with heavily calcified vessels.  2. Severe stenosis ostial and mid LAD 3. Severe stenosis proximal circumflex 4. Severe stenosis large caliber posterolateral artery.   Recommendations: Will consult CT surgery for CABG given severe three vessel CAD in heavily calcified coronary arteries. Continue ASA and statin.       Recent Radiology Findings:   No results found.   I have independently reviewed the above radiologic studies and discussed with  the patient   Recent Lab Findings: Lab Results  Component Value Date   WBC 6.0 11/04/2018   HGB 12.9 (L) 11/04/2018   HCT 38.2 11/04/2018   PLT 278 11/04/2018   GLUCOSE 89 11/04/2018   CHOL 163 04/16/2017   TRIG 30.0 04/16/2017   HDL 86.90 04/16/2017   LDLCALC 70 04/16/2017   ALT 14 04/16/2017   AST 15 04/16/2017   NA 135 11/04/2018   K 4.8 11/04/2018   CL 100 11/04/2018   CREATININE 1.32 (H) 11/04/2018   BUN 15 11/04/2018   CO2 24 11/04/2018   TSH 3.22 04/16/2017      Assessment / Plan:      1. Multivessel CAD- continue medical treatment with ASA and statin.  Plan for coronary artery bypass grafting on Monday, November 23, 2018. 2.  Hyperlipidemia-continue statin therapy 3.  Hypertension-on PRN labetalol and as needed hydralazine. 4.  Cervical disc disorder-he is not on any chronic pain medication.  He endorses rare neck pain. 5.  Atrial tachycardia-well-controlled on Ranexa.  The patient today is in normal sinus rhythm.  6.  Previous tobacco abuse-quit smoking in 1986  Plan: Coronary artery bypass grafting was explained to the patient and all questions were answered to his satisfaction.  The patient is wondering whether he can go home before surgery and come back and I have left that up to Dr. Angelena Form and his team.  We will plan for coronary bypass grafting on Monday, November 23, 2018 .  Plan  for echocardiogram to evaluate heart valves and heart function.    Nicholes Rough, PA-C 11/18/2018 12:15 PM   I have reviewed the patients history,examined him and reviewed cath films with coronary artery disease symptomatic I agree with Cardiology best treatment option is proceeding with CABG, Plan for Monday   I have seen and examined Ardine Eng and agree with the above assessment  and plan.  [name] MD [facility] [phone] Office [phone] 11/19/2018 5:51 PM

## 2018-11-18 NOTE — H&P (Signed)
Cardiology Admission History and Physical:   Patient ID: Jeff Graves MRN: [id number]; DOB: Feb 05, 1946   Admission date: 11/18/2018  Primary Care Provider: Vivi Barrack, MD Primary Cardiologist: Caryl Comes  Chief Complaint:  Chest pain   History of Present Illness:    Jeff Graves is a 73 y.o. male with history of HTN, HLD, atrial tachycardia with recent chest pain concerning for unstable angina. Cardiac catheterization today with severe three vessel CAD. He has no resting chest pain. He is being admitted post cath for CT surgical consultation for CABG.    Past Medical History:  Diagnosis Date  . Allergy    seasonal  . Atrial tachycardia ([place]) 06/04/2012   12/13-QRS duration-102 ms 6/14-QRS duration-98 ms   . DDD (degenerative disc disease)   . Diverticulosis of colon   . Hemorrhoids   . Hypercholesterolemia   . Hypertension   . PAC (premature atrial contraction)   . Renal calculus   . Shoulder pain   . Sinus bradycardia     Past Surgical History:  Procedure Laterality Date  . COLONOSCOPY    . None    . SHOULDER SURGERY Left 03/29/2014     Medications Prior to Admission: Prior to Admission medications   Medication Sig Start Date End Date Taking? Authorizing Provider  aspirin EC 81 MG tablet Take 1 tablet (81 mg total) by mouth daily. 06/04/12  Yes [name], MD  atorvastatin (LIPITOR) 10 MG tablet TAKE 1 TABLET BY MOUTH DAILY Patient taking differently: Take 10 mg by mouth daily at 6 PM.  08/18/18  Yes Noralee Space, MD  Cholecalciferol (VITAMIN D) 2000 units CAPS Take 2,000 Units by mouth daily.   Yes [provider]  losartan (COZAAR) 50 MG tablet Take 50 mg by mouth daily.  08/20/18  Yes [provider]  Multiple Vitamins-Minerals (MULTIVITAMIN WITH MINERALS) tablet Take 1 tablet by mouth daily.   Yes [provider]  ranolazine (RANEXA) 500 MG 12 hr tablet Take 1 tablet (500 mg total) by mouth 2 (two) times daily. Please keep  upcoming appt for future refills. Thank you Patient taking differently: Take 500 mg by mouth 2 (two) times daily.  07/17/18  Yes [name], MD  azithromycin (ZITHROMAX) 250 MG tablet Take 2 tablets (500 mg) on day 1, then take 1 tablet (250 mg) on days 2-5 Patient not taking: Reported on 11/16/2018 11/27/17   Fenton Foy, NP  loratadine (CLARITIN) 10 MG tablet Take 10 mg by mouth daily as needed for allergies.     [provider]     Allergies:   No Known Allergies  Social History:   Social History   Socioeconomic History  . Marital status: Married    Spouse name: brenda  . Number of children: 1  . Years of education: Not on file  . Highest education level: Not on file  Occupational History  . Occupation: Chief of Staff: [employer]  . Financial resource strain: Not on file  . Food insecurity    Worry: Not on file    Inability: Not on file  . Transportation needs    Medical: Not on file    Non-medical: Not on file  Tobacco Use  . Smoking status: Former Smoker    Packs/day: 0.50    Years: 20.00    Pack years: 10.00    Types: Cigarettes    Quit date: 02/12/1984  Years since quitting: 34.7  . Smokeless tobacco: Never Used  Substance and Sexual Activity  . Alcohol use: No    Alcohol/week: 0.0 standard drinks    Comment: social use  . Drug use: No  . Sexual activity: Not on file  Lifestyle  . Physical activity    Days per week: Not on file    Minutes per session: Not on file  . Stress: Not on file  Relationships  . Social Herbalist on phone: Not on file    Gets together: Not on file    Attends religious service: Not on file    Active member of club or organization: Not on file    Attends meetings of clubs or organizations: Not on file    Relationship status: Not on file  . Intimate partner violence    Fear of current or ex partner: Not on file    Emotionally abused: Not on file    Physically  abused: Not on file    Forced sexual activity: Not on file  Other Topics Concern  . Not on file  Social History Narrative  . Not on file    Family History:   The patient's family history includes Breast cancer in his sister; Heart attack (age of onset: 56) in his father; Lymphoma in his sister; Valvular heart disease (age of onset: 67) in his mother. There is no history of Colon cancer, Esophageal cancer, Rectal cancer, or Stomach cancer.    ROS:  Please see the history of present illness.  All other ROS reviewed and negative.     Physical Exam/Data:   Vitals:   11/18/18 0651 11/18/18 0948 11/18/18 1057 11/18/18 1058  BP: (!) 155/97   (!) 158/92  Pulse: (!) 57  (!) 104 (!) 49  Resp: 18  17 18   Temp: (!) 97.3 F (36.3 C)     TempSrc: Skin     SpO2: 100% 100% 92% 97%  Weight: 72.6 kg     Height: 5\' 10"  (1.778 m)      No intake or output data in the 24 hours ending 11/18/18 1116 Last 3 Weights 11/18/2018 11/04/2018 11/27/2017  Weight (lbs) 160 lb 163 lb 3.2 oz 163 lb  Weight (kg) 72.576 kg 74.027 kg 73.936 kg     Body mass index is 22.96 kg/m.  General:  Well nourished, well developed, in no acute distress HEENT: normal Lymph: no adenopathy Neck: no JVD Endocrine:  No thryomegaly Vascular: No carotid bruits; FA pulses 2+ bilaterally without bruits  Cardiac:  normal S1, S2; RRR; no murmur  Lungs:  clear to auscultation bilaterally, no wheezing, rhonchi or rales  Abd: soft, nontender, no hepatomegaly  Ext: no LE edema Musculoskeletal:  No deformities, BUE and BLE strength normal and equal Skin: warm and dry  Neuro:  CNs 2-12 intact, no focal abnormalities noted Psych:  Normal affect    Laboratory Data:  High Sensitivity Troponin:  No results for input(s): TROPONINIHS in the last 720 hours.    ChemistryNo results for input(s): NA, K, CL, CO2, GLUCOSE, BUN, CREATININE, CALCIUM, GFRNONAA, GFRAA, ANIONGAP in the last 168 hours.  No results for input(s): PROT, ALBUMIN, AST,  ALT, ALKPHOS, BILITOT in the last 168 hours. HematologyNo results for input(s): WBC, RBC, HGB, HCT, MCV, MCH, MCHC, RDW, PLT in the last 168 hours. BNPNo results for input(s): BNP, PROBNP in the last 168 hours.  DDimer No results for input(s): DDIMER in the last 168 hours.  Radiology/Studies:  No results found.  Assessment and Plan:   1. CAD with unstable angina: Pt found to have severe, heavily calcified triple vessel CAD on cath today. Good distal targets. Will admit to telemetry. Will continue ASA and statin. Add beta blocker later based on heart rate. Will arrange an echo today. Will ask CT surgery to see him today to discuss CABG. Given his heavily calcified vessels with severe disease in all three major vessels, I think CABG is the best revascularization option for him.   For questions or updates, please contact [place] Please consult [email or web address] for contact info under        Signed, Lauree Chandler, MD  11/18/2018 11:16 AM

## 2018-11-18 NOTE — Progress Notes (Signed)
  Echocardiogram 2D Echocardiogram has been performed. 11/18/2018, 3:47 PM

## 2018-11-18 NOTE — Progress Notes (Signed)
TCTS consulted for CABG evaluation. °

## 2018-11-19 ENCOUNTER — Encounter (HOSPITAL_COMMUNITY): Payer: Self-pay | Admitting: Cardiovascular Disease

## 2018-11-19 ENCOUNTER — Inpatient Hospital Stay (HOSPITAL_COMMUNITY): Payer: PPO

## 2018-11-19 DIAGNOSIS — I200 Unstable angina: Secondary | ICD-10-CM

## 2018-11-19 DIAGNOSIS — E7800 Pure hypercholesterolemia, unspecified: Secondary | ICD-10-CM

## 2018-11-19 DIAGNOSIS — I10 Essential (primary) hypertension: Secondary | ICD-10-CM

## 2018-11-19 LAB — BASIC METABOLIC PANEL
Anion gap: 10 (ref 5–15)
BUN: 16 mg/dL (ref 8–23)
CO2: 23 mmol/L (ref 22–32)
Calcium: 8.5 mg/dL — ABNORMAL LOW (ref 8.9–10.3)
Chloride: 103 mmol/L (ref 98–111)
Creatinine, Ser: 1.33 mg/dL — ABNORMAL HIGH (ref 0.61–1.24)
GFR calc Af Amer: >60 mL/min (ref >60)
GFR calc non Af Amer: 53 mL/min — ABNORMAL LOW (ref >60)
Glucose, Bld: 89 mg/dL (ref 70–99)
Potassium: 4.5 mmol/L (ref 3.5–5.1)
Sodium: 136 mmol/L (ref 135–145)

## 2018-11-19 LAB — CBC
HCT: 34 % — ABNORMAL LOW (ref 39.0–52.0)
Hemoglobin: 11.9 g/dL — ABNORMAL LOW (ref 13.0–17.0)
MCH: 33.3 pg (ref 26.0–34.0)
MCHC: 35 g/dL (ref 30.0–36.0)
MCV: 95.2 fL (ref 80.0–100.0)
Platelets: 231 10*3/uL (ref 150–400)
RBC: 3.57 MIL/uL — ABNORMAL LOW (ref 4.22–5.81)
RDW: 12.8 % (ref 11.5–15.5)
WBC: 7.4 10*3/uL (ref 4.0–10.5)
nRBC: 0 % (ref 0.0–0.2)

## 2018-11-19 LAB — PULMONARY FUNCTION TEST
DL/VA % pred: 93 %
DL/VA: 3.76 ml/min/mmHg/L
DLCO cor % pred: 84 %
DLCO cor: 21.72 ml/min/mmHg
DLCO unc % pred: 77 %
DLCO unc: 19.86 ml/min/mmHg
FEF 25-75 Post: 3.46 L/sec
FEF 25-75 Pre: 3.2 L/sec
FEF2575-%Change-Post: 8 %
FEF2575-%Pred-Post: 142 %
FEF2575-%Pred-Pre: 131 %
FEV1-%Change-Post: 2 %
FEV1-%Pred-Post: 105 %
FEV1-%Pred-Pre: 103 %
FEV1-Post: 3.4 L
FEV1-Pre: 3.32 L
FEV1FVC-%Change-Post: -1 %
FEV1FVC-%Pred-Pre: 109 %
FEV6-%Change-Post: 2 %
FEV6-%Pred-Post: 101 %
FEV6-%Pred-Pre: 99 %
FEV6-Post: 4.21 L
FEV6-Pre: 4.11 L
FEV6FVC-%Change-Post: 0 %
FEV6FVC-%Pred-Post: 103 %
FEV6FVC-%Pred-Pre: 104 %
FVC-%Change-Post: 3 %
FVC-%Pred-Post: 97 %
FVC-%Pred-Pre: 94 %
FVC-Post: 4.3 L
FVC-Pre: 4.15 L
Post FEV1/FVC ratio: 79 %
Post FEV6/FVC ratio: 98 %
Pre FEV1/FVC ratio: 80 %
Pre FEV6/FVC Ratio: 99 %
RV % pred: 86 %
RV: 2.13 L
TLC % pred: 88 %
TLC: 6.23 L

## 2018-11-19 LAB — POCT ACTIVATED CLOTTING TIME: Activated Clotting Time: 164 seconds

## 2018-11-19 LAB — GLUCOSE, CAPILLARY: Glucose-Capillary: 118 mg/dL — ABNORMAL HIGH (ref 70–99)

## 2018-11-19 MED ORDER — ALBUTEROL SULFATE (2.5 MG/3ML) 0.083% IN NEBU
2.5000 mg | INHALATION_SOLUTION | Freq: Once | RESPIRATORY_TRACT | Status: AC
  Administered 2018-11-19: 2.5 mg via RESPIRATORY_TRACT

## 2018-11-19 MED FILL — Heparin Sodium (Porcine) Inj 1000 Unit/ML: INTRAMUSCULAR | Qty: 10 | Status: AC

## 2018-11-19 NOTE — Progress Notes (Signed)
Progress Note  Patient Name: Jeff Graves Date of Encounter: 11/19/2018  Primary Cardiologist: Lauree Chandler, MD  Primary Electrophysiologist: Dr. Caryl Comes  Subjective   Feeling well.  No chest pain.  He only has chest pain when he exerts himself.  Inpatient Medications    Scheduled Meds: . albuterol  2.5 mg Nebulization Once  . aspirin EC  81 mg Oral Daily  . atorvastatin  10 mg Oral q1800  . influenza vaccine adjuvanted  0.5 mL Intramuscular Tomorrow-1000  . losartan  50 mg Oral Daily  . ranolazine  500 mg Oral BID  . sodium chloride flush  3 mL Intravenous Q12H  . sodium chloride flush  3 mL Intravenous Q12H   Continuous Infusions: . sodium chloride     PRN Meds: sodium chloride, acetaminophen, ondansetron (ZOFRAN) IV, sodium chloride flush   Vital Signs    Vitals:   11/19/18 0129 11/19/18 0547 11/19/18 0746 11/19/18 0800  BP: 133/73 130/70 (!) 121/91 (!) 156/85  Pulse: (!) 55 (!) 51 62 62  Resp: 20 18 18 20   Temp: 98.2 F (36.8 C) 98.3 F (36.8 C) 99.2 F (37.3 C) 98 F (36.7 C)  TempSrc: Oral Oral Oral Oral  SpO2: 97% 97% 97% 100%  Weight: 71.5 kg     Height:        Intake/Output Summary (Last 24 hours) at 11/19/2018 1032 Last data filed at 11/19/2018 0956 Gross per 24 hour  Intake 789.25 ml  Output 900 ml  Net -110.75 ml   Last 3 Weights 11/19/2018 11/18/2018 11/18/2018  Weight (lbs) 157 lb 11.2 oz 157 lb 3.2 oz 160 lb  Weight (kg) 71.532 kg 71.305 kg 72.576 kg      Telemetry    Sinus bradycardia.  PVCs.- Personally Reviewed  ECG    N/a - Personally Reviewed  Physical Exam   VS:  BP (!) 156/85 (BP Location: Left Arm)   Pulse 62   Temp 98 F (36.7 C) (Oral)   Resp 20   Ht 5\' 10"  (1.778 m)   Wt 71.5 kg   SpO2 100%   BMI 22.63 kg/m  , BMI Body mass index is 22.63 kg/m. GENERAL:  Well appearing HEENT: Pupils equal round and reactive, fundi not visualized, oral mucosa unremarkable NECK:  No jugular venous distention, waveform  within normal limits, carotid upstroke brisk and symmetric, no bruits LUNGS:  Clear to auscultation bilaterally HEART:  RRR.  PMI not displaced or sustained,S1 and S2 within normal limits, no S3, no S4, no clicks, no rubs, no murmurs ABD:  Flat, positive bowel sounds normal in frequency in pitch, no bruits, no rebound, no guarding, no midline pulsatile mass, no hepatomegaly, no splenomegaly EXT:  2 plus pulses throughout, no edema, no cyanosis no clubbing SKIN:  No rashes no nodules NEURO:  Cranial nerves II through XII grossly intact, motor grossly intact throughout PSYCH:  Cognitively intact, oriented to person place and time   Labs    High Sensitivity Troponin:  No results for input(s): TROPONINIHS in the last 720 hours.    Chemistry Recent Labs  Lab 11/19/18 0327  NA 136  K 4.5  CL 103  CO2 23  GLUCOSE 89  BUN 16  CREATININE 1.33*  CALCIUM 8.5*  GFRNONAA 53*  GFRAA >60  ANIONGAP 10     Hematology Recent Labs  Lab 11/19/18 0327  WBC 7.4  RBC 3.57*  HGB 11.9*  HCT 34.0*  MCV 95.2  MCH 33.3  MCHC 35.0  RDW 12.8  PLT 231    BNPNo results for input(s): BNP, PROBNP in the last 168 hours.   DDimer No results for input(s): DDIMER in the last 168 hours.   Radiology    No results found.  Cardiac Studies   Echo 11/18/18: IMPRESSIONS    1. Left ventricular ejection fraction, by visual estimation, is 60 to 65%. The left ventricle has normal function. Normal left ventricular size. Left ventricular septal wall thickness was mildly increased. There is mildly increased left ventricular  hypertrophy.  2. Global right ventricle has normal systolic function.The right ventricular size is normal. No increase in right ventricular wall thickness.  3. Left atrial size was normal.  4. Right atrial size was normal.  5. The mitral valve is normal in structure. Mild mitral valve regurgitation. No evidence of mitral stenosis.  6. The tricuspid valve is normal in structure.  Tricuspid valve regurgitation was not visualized by color flow Doppler.  7. The aortic valve is tricuspid Aortic valve regurgitation is mild by color flow Doppler. Mild aortic valve sclerosis without stenosis.  8. The pulmonic valve was normal in structure. Pulmonic valve regurgitation is not visualized by color flow Doppler.  9. The inferior vena cava is normal in size with greater than 50% respiratory variability, suggesting right atrial pressure of 3 mmHg.  LHC 11/18/18:  Prox RCA to Dist RCA lesion is 40% stenosed.  Dist RCA lesion is 60% stenosed.  RPAV lesion is 95% stenosed.  Prox Cx lesion is 80% stenosed.  Ost LAD to Prox LAD lesion is 99% stenosed.  Prox LAD to Mid LAD lesion is 80% stenosed.   1. Severe triple vessel CAD with heavily calcified vessels.  2. Severe stenosis ostial and mid LAD 3. Severe stenosis proximal circumflex 4. Severe stenosis large caliber posterolateral artery.   Patient Profile     73 y.o. male with hypertension, hyperlipidemia, and atrial tachycardia admitted with unstable angina.  He underwent LHC and was found to have 3 vessel CAD.  Currently awaiting CABG.  Assessment & Plan    # Unstable angina: # 3 vessel CAD: He will be going for CABG on Monday with Dr. Servando Snare.  He is currently chest pain-free.  We discussed going home.  However, given his 99% ostial LAD disease and significant proximal circumflex disease he is okay with staying here.  He is not on a beta-blocker due to bradycardia.  Continue aspirin, atorvastatin, and ranolazine.  # Hypertension: Blood pressure has been poorly controlled.  He will receive his first dose of losartan today.  Continue to monitor and adjust as needed.  No beta blocker 2/2 bradycardia.    # Hyperlipidemia: Check lipids.  Atorvastatin was increased to 80 mg this admission.  He will need lipids checked in 6 to 8 weeks.   For questions or updates, please contact [place] Please consult [email or web address]  for contact info under        Signed, Skeet Latch, MD  11/19/2018, 10:32 AM

## 2018-11-20 ENCOUNTER — Inpatient Hospital Stay (HOSPITAL_COMMUNITY): Payer: PPO

## 2018-11-20 DIAGNOSIS — N183 Chronic kidney disease, stage 3 unspecified: Secondary | ICD-10-CM

## 2018-11-20 DIAGNOSIS — I471 Supraventricular tachycardia: Secondary | ICD-10-CM

## 2018-11-20 DIAGNOSIS — N1831 Chronic kidney disease, stage 3a: Secondary | ICD-10-CM

## 2018-11-20 DIAGNOSIS — Z01810 Encounter for preprocedural cardiovascular examination: Secondary | ICD-10-CM

## 2018-11-20 LAB — BASIC METABOLIC PANEL
Anion gap: 7 (ref 5–15)
BUN: 15 mg/dL (ref 8–23)
CO2: 27 mmol/L (ref 22–32)
Calcium: 8.8 mg/dL — ABNORMAL LOW (ref 8.9–10.3)
Chloride: 101 mmol/L (ref 98–111)
Creatinine, Ser: 1.23 mg/dL (ref 0.61–1.24)
GFR calc Af Amer: >60 mL/min (ref >60)
GFR calc non Af Amer: 59 mL/min — ABNORMAL LOW (ref >60)
Glucose, Bld: 88 mg/dL (ref 70–99)
Potassium: 4.4 mmol/L (ref 3.5–5.1)
Sodium: 135 mmol/L (ref 135–145)

## 2018-11-20 LAB — LIPID PANEL
Cholesterol: 145 mg/dL (ref 0–200)
HDL: 72 mg/dL (ref >40)
LDL Cholesterol: 69 mg/dL (ref 0–99)
Total CHOL/HDL Ratio: 2 RATIO
Triglycerides: 18 mg/dL (ref <150)
VLDL: 4 mg/dL (ref 0–40)

## 2018-11-20 LAB — HEPATIC FUNCTION PANEL
ALT: 16 U/L (ref 0–44)
AST: 16 U/L (ref 15–41)
Albumin: 3.3 g/dL — ABNORMAL LOW (ref 3.5–5.0)
Alkaline Phosphatase: 55 U/L (ref 38–126)
Bilirubin, Direct: 0.2 mg/dL (ref 0.0–0.2)
Indirect Bilirubin: 0.6 mg/dL (ref 0.3–0.9)
Total Bilirubin: 0.8 mg/dL (ref 0.3–1.2)
Total Protein: 5.9 g/dL — ABNORMAL LOW (ref 6.5–8.1)

## 2018-11-20 LAB — CBC
HCT: 36.1 % — ABNORMAL LOW (ref 39.0–52.0)
Hemoglobin: 12.2 g/dL — ABNORMAL LOW (ref 13.0–17.0)
MCH: 32.4 pg (ref 26.0–34.0)
MCHC: 33.8 g/dL (ref 30.0–36.0)
MCV: 96 fL (ref 80.0–100.0)
Platelets: 260 10*3/uL (ref 150–400)
RBC: 3.76 MIL/uL — ABNORMAL LOW (ref 4.22–5.81)
RDW: 12.7 % (ref 11.5–15.5)
WBC: 6 10*3/uL (ref 4.0–10.5)
nRBC: 0 % (ref 0.0–0.2)

## 2018-11-20 LAB — GLUCOSE, CAPILLARY
Glucose-Capillary: 101 mg/dL — ABNORMAL HIGH (ref 70–99)
Glucose-Capillary: 84 mg/dL (ref 70–99)
Glucose-Capillary: 134 mg/dL — ABNORMAL HIGH (ref 70–99)

## 2018-11-20 MED ORDER — AMLODIPINE BESYLATE 2.5 MG PO TABS
2.5000 mg | ORAL_TABLET | Freq: Every day | ORAL | Status: DC
  Administered 2018-11-20 – 2018-11-22 (×3): 2.5 mg via ORAL
  Filled 2018-11-20 (×3): qty 1

## 2018-11-20 MED ORDER — ATORVASTATIN CALCIUM 80 MG PO TABS
80.0000 mg | ORAL_TABLET | Freq: Every day | ORAL | Status: DC
  Administered 2018-11-20 – 2018-11-27 (×7): 80 mg via ORAL
  Filled 2018-11-20 (×7): qty 1

## 2018-11-20 NOTE — Progress Notes (Signed)
Progress Note  Patient Name: Jeff Graves Date of Encounter: 11/20/2018  Primary Cardiologist: Lauree Chandler, MD  Subjective   No chest pain at rest. (Only happens with activity.) Denies any bleeding. No issues with cath site. No prior issues with higher statin dose.  Inpatient Medications    Scheduled Meds: . aspirin EC  81 mg Oral Daily  . atorvastatin  10 mg Oral q1800  . influenza vaccine adjuvanted  0.5 mL Intramuscular Tomorrow-1000  . losartan  50 mg Oral Daily  . ranolazine  500 mg Oral BID  . sodium chloride flush  3 mL Intravenous Q12H  . sodium chloride flush  3 mL Intravenous Q12H   Continuous Infusions: . sodium chloride     PRN Meds: sodium chloride, acetaminophen, ondansetron (ZOFRAN) IV, sodium chloride flush   Vital Signs    Vitals:   11/19/18 2054 11/20/18 0140 11/20/18 0635 11/20/18 0803  BP: (!) 154/73 (!) 151/79 138/81 (!) 158/93  Pulse: (!) 51 (!) 59 (!) 50 (!) 55  Resp: 20 18 18 18   Temp: 98.5 F (36.9 C) 97.8 F (36.6 C) 98 F (36.7 C) 98.2 F (36.8 C)  TempSrc: Oral Oral Oral Oral  SpO2: 99% 98% 99% 98%  Weight:  71.4 kg    Height:        Intake/Output Summary (Last 24 hours) at 11/20/2018 0946 Last data filed at 11/20/2018 0833 Gross per 24 hour  Intake 566 ml  Output 1300 ml  Net -734 ml   Last 3 Weights 11/20/2018 11/19/2018 11/18/2018  Weight (lbs) 157 lb 4.8 oz 157 lb 11.2 oz 157 lb 3.2 oz  Weight (kg) 71.351 kg 71.532 kg 71.305 kg     Telemetry    SB/SR (47-60s), one PVC couplet - Personally Reviewed  Physical Exam   GEN: No acute distress.  HEENT: Normocephalic, atraumatic, sclera non-icteric. Neck: No JVD or bruits. Cardiac: RRR no murmurs, rubs, or gallops.  Radials/DP/PT 1+ and equal bilaterally.  Respiratory: Clear to auscultation bilaterally. Breathing is unlabored. GI: Soft, nontender, non-distended, BS +x 4. MS: no deformity. Extremities: No clubbing or cyanosis. No edema. Distal pedal pulses are  2+ and equal bilaterally. Right radial cath site without hematoma or ecchymosis; good pulse. Neuro:  AAOx3. Follows commands. Psych:  Responds to questions appropriately with a normal affect.  Labs    High Sensitivity Troponin:  No results for input(s): TROPONINIHS in the last 720 hours.    Cardiac EnzymesNo results for input(s): TROPONINI in the last 168 hours. No results for input(s): TROPIPOC in the last 168 hours.   Chemistry Recent Labs  Lab 11/19/18 0327  NA 136  K 4.5  CL 103  CO2 23  GLUCOSE 89  BUN 16  CREATININE 1.33*  CALCIUM 8.5*  GFRNONAA 53*  GFRAA >60  ANIONGAP 10     Hematology Recent Labs  Lab 11/19/18 0327  WBC 7.4  RBC 3.57*  HGB 11.9*  HCT 34.0*  MCV 95.2  MCH 33.3  MCHC 35.0  RDW 12.8  PLT 231   Radiology    No results found.  Cardiac Studies   2D echo 11/18/18 IMPRESSIONS    1. Left ventricular ejection fraction, by visual estimation, is 60 to 65%. The left ventricle has normal function. Normal left ventricular size. Left ventricular septal wall thickness was mildly increased. There is mildly increased left ventricular  hypertrophy.  2. Global right ventricle has normal systolic function.The right ventricular size is normal. No increase in right ventricular  wall thickness.  3. Left atrial size was normal.  4. Right atrial size was normal.  5. The mitral valve is normal in structure. Mild mitral valve regurgitation. No evidence of mitral stenosis.  6. The tricuspid valve is normal in structure. Tricuspid valve regurgitation was not visualized by color flow Doppler.  7. The aortic valve is tricuspid Aortic valve regurgitation is mild by color flow Doppler. Mild aortic valve sclerosis without stenosis.  8. The pulmonic valve was normal in structure. Pulmonic valve regurgitation is not visualized by color flow Doppler.  9. The inferior vena cava is normal in size with greater than 50% respiratory variability, suggesting right atrial  pressure of 3 mmHg  Cath 11/18/18 Conclusion   Prox RCA to Dist RCA lesion is 40% stenosed.  Dist RCA lesion is 60% stenosed.  RPAV lesion is 95% stenosed.  Prox Cx lesion is 80% stenosed.  Ost LAD to Prox LAD lesion is 99% stenosed.  Prox LAD to Mid LAD lesion is 80% stenosed.   1. Severe triple vessel CAD with heavily calcified vessels.  2. Severe stenosis ostial and mid LAD 3. Severe stenosis proximal circumflex 4. Severe stenosis large caliber posterolateral artery.   Recommendations: Will consult CT surgery for CABG given severe three vessel CAD in heavily calcified coronary arteries. Continue ASA and statin.     Patient Profile     73 y.o. male with hypertension, hyperlipidemia, atrial tachycardia, sinus bradycardia, remote tobacco abuse, cervical disc disease was recently seen as outpatient with exertional chest pain. He had a prior abnormal nuclear stress test.  He underwent LHC and was found to have 3 vessel CAD, currently awaiting CABG which is scheduled for 11/23/18.  Assessment & Plan    1. Unstable angina/multivessel CAD - continue ASA, atorvastatin, Ranexa. Will titrate statin. Per prior notes, he is remaining inpatient prior to CABG given location of his multivessel disease. If he develops rest pain would add heparin but for now symptomatology is stable with exertion only.  2. Essential HTN - BP mildly elevated at times. Consider addition of amlodipine. Not on BB given baseline sinus bradycardia.  3. Hyperlipidemia - LDL 69. He is currently on atorvastatin 10mg  daily. Will add baseline LFTs and titrate to 80mg  daily given multivessel disease. If the patient is tolerating statin at time of follow-up appointment, would consider rechecking liver function/lipid panel in 6-8 weeks.  4. Mild AI/MR by echo - follow clinically, no indication felt for replacement at present time.  5. Mild anemia - Hgb 12.9 as OP, 11.9 yesterday. No bleeding reported. Will order f/u CBC x  2 days to trend.  6. CKD stage II-III by labs - baseline Cr appears around 1.3-1.4. Will order f/u post-cath level this AM and over the weekend.  7. H/o atrial tach - quiescent. Continue to monitor on telemetry. Not on BB due to baseline sinus bradycardia.  For questions or updates, please contact [place] Please consult [email or web address] for contact info under Cardiology/STEMI.  Signed, Charlie Pitter, PA-C 11/20/2018, 9:46 AM

## 2018-11-20 NOTE — Plan of Care (Signed)
  Problem: Activity: Goal: Risk for activity intolerance will decrease Outcome: Progressing   Problem: Safety: Goal: Ability to remain free from injury will improve Outcome: Progressing   

## 2018-11-20 NOTE — Progress Notes (Signed)
      [address]       [place] [address]             [phone]                 2 Days Post-Op Procedure(s) (LRB): LEFT HEART CATH AND CORONARY ANGIOGRAPHY (N/A)  LOS: 2 days   Subjective: Patient alert and without t chest pain   Objective: Vital signs in last 24 hours: Patient Vitals for the past 24 hrs:  BP Temp Temp src Pulse Resp SpO2 Weight  11/20/18 1151 126/81 98.4 F (36.9 C) Oral (!) 58 18 100 % -  11/20/18 0803 (!) 158/93 98.2 F (36.8 C) Oral (!) 55 18 98 % -  11/20/18 0635 138/81 98 F (36.7 C) Oral (!) 50 18 99 % -  11/20/18 0140 (!) 151/79 97.8 F (36.6 C) Oral (!) 59 18 98 % 71.4 kg  11/19/18 2054 (!) 154/73 98.5 F (36.9 C) Oral (!) 51 20 99 % -  11/19/18 1718 (!) 153/81 98.5 F (36.9 C) Oral (!) 53 - 100 % -    Filed Weights   11/18/18 1324 11/19/18 0129 11/20/18 0140  Weight: 71.3 kg 71.5 kg 71.4 kg    Hemodynamic parameters for last 24 hours:    Intake/Output from previous day: 10/08 0701 - 10/09 0700 In: 666 [P.O.:660; I.V.:6] Out: 1300 [Urine:1300] Intake/Output this shift: Total I/O In: 680 [P.O.:680] Out: -   Scheduled Meds: . amLODipine  2.5 mg Oral Daily  . aspirin EC  81 mg Oral Daily  . atorvastatin  80 mg Oral q1800  . influenza vaccine adjuvanted  0.5 mL Intramuscular Tomorrow-1000  . losartan  50 mg Oral Daily  . ranolazine  500 mg Oral BID  . sodium chloride flush  3 mL Intravenous Q12H  . sodium chloride flush  3 mL Intravenous Q12H   Continuous Infusions: . sodium chloride     PRN Meds:.sodium chloride, acetaminophen, ondansetron (ZOFRAN) IV, sodium chloride flush  General appearance: alert, cooperative and no distress Neurologic: intact Heart: regular rate and rhythm, S1, S2 normal, no murmur, click, rub or gallop Lungs: clear to auscultation bilaterally Abdomen: soft, non-tender; bowel sounds normal; no masses,  no organomegaly Extremities: extremities normal, atraumatic, no cyanosis or edema  Wound: cath site ok  Lab Results: CBC: Recent Labs    11/19/18 0327 11/20/18 1026  WBC 7.4 6.0  HGB 11.9* 12.2*  HCT 34.0* 36.1*  PLT 231 260   BMET:  Recent Labs    11/19/18 0327 11/20/18 1026  NA 136 135  K 4.5 4.4  CL 103 101  CO2 23 27  GLUCOSE 89 88  BUN 16 15  CREATININE 1.33* 1.23  CALCIUM 8.5* 8.8*    PT/INR: No results for input(s): LABPROT, INR in the last 72 hours.   Radiology No results found.   Assessment/Plan: S/P Procedure(s) (LRB): LEFT HEART CATH AND CORONARY ANGIOGRAPHY (N/A) ECHO reviewed Plan cabg Monday   Jeff Graves 11/20/2018 3:16 PM     Patient ID: Jeff Graves, male   DOB: [birth year], 73 y.o.   MRN: [id number]

## 2018-11-20 NOTE — Care Management Important Message (Signed)
Important Message  Patient Details  Name: Jeff Graves MRN: [id number] Date of Birth: 10-30-1945   Medicare Important Message Given:  Yes     Shelda Altes 11/20/2018, 1:10 PM

## 2018-11-20 NOTE — Progress Notes (Signed)
CARDIAC REHAB PHASE I   Preop education completed with pt. Pt given IS and able to demonstrate 2200. Pt educated on importance of walks, IS use, and sternal precautions postop. Pt given in-the-tube sheet, Cardiac surgery booklet, and [facility] care guide. Pt admits to some CP with exertion, encouraged light ambulation as able, but stressed importance of resting when if symptoms return.  [id number] Rufina Falco, RN BSN 11/20/2018 10:17 AM

## 2018-11-20 NOTE — Progress Notes (Signed)
Pre-CABG Dopplers completed. Refer to "CV Proc" under chart review to view preliminary results.  11/20/2018 3:59 PM Maudry Mayhew, MHA, RVT, RDCS, RDMS

## 2018-11-21 LAB — BASIC METABOLIC PANEL
Anion gap: 9 (ref 5–15)
BUN: 18 mg/dL (ref 8–23)
CO2: 25 mmol/L (ref 22–32)
Calcium: 8.8 mg/dL — ABNORMAL LOW (ref 8.9–10.3)
Chloride: 100 mmol/L (ref 98–111)
Creatinine, Ser: 1.26 mg/dL — ABNORMAL HIGH (ref 0.61–1.24)
GFR calc Af Amer: >60 mL/min (ref >60)
GFR calc non Af Amer: 57 mL/min — ABNORMAL LOW (ref >60)
Glucose, Bld: 92 mg/dL (ref 70–99)
Potassium: 4.2 mmol/L (ref 3.5–5.1)
Sodium: 134 mmol/L — ABNORMAL LOW (ref 135–145)

## 2018-11-21 LAB — CBC
HCT: 34.4 % — ABNORMAL LOW (ref 39.0–52.0)
Hemoglobin: 12.2 g/dL — ABNORMAL LOW (ref 13.0–17.0)
MCH: 32.9 pg (ref 26.0–34.0)
MCHC: 35.5 g/dL (ref 30.0–36.0)
MCV: 92.7 fL (ref 80.0–100.0)
Platelets: 243 10*3/uL (ref 150–400)
RBC: 3.71 MIL/uL — ABNORMAL LOW (ref 4.22–5.81)
RDW: 12.6 % (ref 11.5–15.5)
WBC: 6.6 10*3/uL (ref 4.0–10.5)
nRBC: 0 % (ref 0.0–0.2)

## 2018-11-21 LAB — GLUCOSE, CAPILLARY
Glucose-Capillary: 91 mg/dL (ref 70–99)
Glucose-Capillary: 142 mg/dL — ABNORMAL HIGH (ref 70–99)

## 2018-11-21 NOTE — Progress Notes (Signed)
Progress Note  Patient Name: Jeff Graves Date of Encounter: 11/21/2018  Primary Cardiologist: Lauree Chandler, MD  Subjective   Denies any chest pain or shortness of breath  Inpatient Medications    Scheduled Meds:  amLODipine  2.5 mg Oral Daily   aspirin EC  81 mg Oral Daily   atorvastatin  80 mg Oral q1800   influenza vaccine adjuvanted  0.5 mL Intramuscular Tomorrow-1000   losartan  50 mg Oral Daily   ranolazine  500 mg Oral BID   sodium chloride flush  3 mL Intravenous Q12H   sodium chloride flush  3 mL Intravenous Q12H   Continuous Infusions:  sodium chloride     PRN Meds: sodium chloride, acetaminophen, ondansetron (ZOFRAN) IV, sodium chloride flush   Vital Signs    Vitals:   11/21/18 0512 11/21/18 0750 11/21/18 0836 11/21/18 0941  BP: 126/77 133/87 136/82 (!) 144/80  Pulse: (!) 55 (!) 58 (!) 59 (!) 52  Resp: 18 18 18    Temp: 98.2 F (36.8 C)  97.8 F (36.6 C) 97.9 F (36.6 C)  TempSrc: Oral  Oral Oral  SpO2: 100% 100% 100% 100%  Weight: 70.5 kg     Height:        Intake/Output Summary (Last 24 hours) at 11/21/2018 0948 Last data filed at 11/21/2018 0840 Gross per 24 hour  Intake 1360 ml  Output 0 ml  Net 1360 ml   Last 3 Weights 11/21/2018 11/20/2018 11/19/2018  Weight (lbs) 155 lb 8 oz 157 lb 4.8 oz 157 lb 11.2 oz  Weight (kg) 70.534 kg 71.351 kg 71.532 kg     Telemetry    Sinus bradycardia with rate in 50s- Personally Reviewed  Physical Exam   GEN: No acute distress.  HEENT: Normocephalic, atraumatic, sclera non-icteric. Neck: No JVD Cardiac: RRR no murmurs, rubs, or gallops.  Respiratory: Clear to auscultation bilaterally. Breathing is unlabored. GI: Soft, nontende MS: no deformity. Extremities: No edema Neuro:  AAOx3. Follows commands. Psych:  Responds to questions appropriately with a normal affect.  Labs    High Sensitivity Troponin:  No results for input(s): TROPONINIHS in the last 720 hours.    Cardiac  EnzymesNo results for input(s): TROPONINI in the last 168 hours. No results for input(s): TROPIPOC in the last 168 hours.   Chemistry Recent Labs  Lab 11/19/18 0327 11/20/18 1026 11/21/18 0341  NA 136 135 134*  K 4.5 4.4 4.2  CL 103 101 100  CO2 23 27 25   GLUCOSE 89 88 92  BUN 16 15 18   CREATININE 1.33* 1.23 1.26*  CALCIUM 8.5* 8.8* 8.8*  PROT  --  5.9*  --   ALBUMIN  --  3.3*  --   AST  --  16  --   ALT  --  16  --   ALKPHOS  --  55  --   BILITOT  --  0.8  --   GFRNONAA 53* 59* 57*  GFRAA >60 >60 >60  ANIONGAP 10 7 9      Hematology Recent Labs  Lab 11/19/18 0327 11/20/18 1026 11/21/18 0341  WBC 7.4 6.0 6.6  RBC 3.57* 3.76* 3.71*  HGB 11.9* 12.2* 12.2*  HCT 34.0* 36.1* 34.4*  MCV 95.2 96.0 92.7  MCH 33.3 32.4 32.9  MCHC 35.0 33.8 35.5  RDW 12.8 12.7 12.6  PLT 231 260 243   Radiology    Vas US Doppler Pre Cabg  Result Date: 11/20/2018 PREOPERATIVE VASCULAR EVALUATION  Indications:  Pre-surgical evalution. Risk Factors:     Hypertension, hyperlipidemia, coronary artery disease. Comparison Study: No prior study. Performing Technologist: Maudry Mayhew MHA, RDMS, RVT, RDCS  Examination Guidelines: A complete evaluation includes B-mode imaging, spectral Doppler, color Doppler, and power Doppler as needed of all accessible portions of each vessel. Bilateral testing is considered an integral part of a complete examination. Limited examinations for reoccurring indications may be performed as noted.  Right Carotid Findings: +----------+--------+-------+--------+--------------------------------+--------+             PSV cm/s EDV     Stenosis Describe                         Comments                       cm/s                                                        +----------+--------+-------+--------+--------------------------------+--------+  CCA Prox   85       17               smooth and heterogenous                     +----------+--------+-------+--------+--------------------------------+--------+  CCA Distal 78       15               heterogenous and irregular                 +----------+--------+-------+--------+--------------------------------+--------+  ICA Prox   48       14               smooth, heterogenous and                                                         calcific                                   +----------+--------+-------+--------+--------------------------------+--------+  ICA Distal 77       24                                                          +----------+--------+-------+--------+--------------------------------+--------+  ECA        74       7                smooth and heterogenous                    +----------+--------+-------+--------+--------------------------------+--------+ Portions of this table do not appear on this page. +----------+--------+-------+----------------+------------+             PSV cm/s EDV cms Describe         Arm Pressure  +----------+--------+-------+----------------+------------+  Subclavian 137  Multiphasic, WNL 126           +----------+--------+-------+----------------+------------+ +---------+--------+--+--------+--+---------+  Vertebral PSV cm/s 76 EDV cm/s 21 Antegrade  +---------+--------+--+--------+--+---------+ Left Carotid Findings: +----------+--------+--------+--------+--------------------------+--------+             PSV cm/s EDV cm/s Stenosis Describe                   Comments  +----------+--------+--------+--------+--------------------------+--------+  CCA Prox   135      21                smooth and heterogenous              +----------+--------+--------+--------+--------------------------+--------+  CCA Distal 80       15                smooth and heterogenous              +----------+--------+--------+--------+--------------------------+--------+  ICA Prox   70       13                irregular and heterogenous            +----------+--------+--------+--------+--------------------------+--------+  ICA Distal 77       24                                                     +----------+--------+--------+--------+--------------------------+--------+  ECA        80       8                 smooth and heterogenous              +----------+--------+--------+--------+--------------------------+--------+ +----------+--------+--------+----------------+------------+  Subclavian PSV cm/s EDV cm/s Describe         Arm Pressure  +----------+--------+--------+----------------+------------+             98                Multiphasic, WNL               +----------+--------+--------+----------------+------------+ +---------+--------+--+--------+--+---------+  Vertebral PSV cm/s 30 EDV cm/s 10 Antegrade  +---------+--------+--+--------+--+---------+ ABI Findings: +--------+------------------+-----+---------+--------+  Right    Rt Pressure (mmHg) Index Waveform  Comment   +--------+------------------+-----+---------+--------+  Brachial 126                      triphasic           +--------+------------------+-----+---------+--------+  PTA                               triphasic           +--------+------------------+-----+---------+--------+  DP                                triphasic           +--------+------------------+-----+---------+--------+ +--------+------------------+-----+---------+----------------------------------+  Left     Lt Pressure (mmHg) Index Waveform  Comment                             +--------+------------------+-----+---------+----------------------------------+  Brachial  triphasic Pressure not obtained due to IV                                                  location.                           +--------+------------------+-----+---------+----------------------------------+  PTA                               triphasic                                      +--------+------------------+-----+---------+----------------------------------+  DP                                triphasic                                     +--------+------------------+-----+---------+----------------------------------+ Right Doppler Findings: +-----------+--------+-----+---------+--------------------+  Site        Pressure Index Doppler   Comments              +-----------+--------+-----+---------+--------------------+  Brachial    126            triphasic                       +-----------+--------+-----+---------+--------------------+  Radial                     triphasic                       +-----------+--------+-----+---------+--------------------+  Ulnar                      triphasic                       +-----------+--------+-----+---------+--------------------+  Palmar Arch                          Within normal limits  +-----------+--------+-----+---------+--------------------+  Left Doppler Findings: +-----------+--------+-----+---------+-----------------------------------------+  Site        Pressure Index Doppler   Comments                                   +-----------+--------+-----+---------+-----------------------------------------+  Brachial                   triphasic Pressure not obtained due to IV location.  +-----------+--------+-----+---------+-----------------------------------------+  Radial                     triphasic                                            +-----------+--------+-----+---------+-----------------------------------------+  Ulnar  triphasic                                            +-----------+--------+-----+---------+-----------------------------------------+  Palmar Arch                          Signal obliterates with radial                                                   compression, is unaffected with ulnar                                            compression                                 +-----------+--------+-----+---------+-----------------------------------------+  Summary: Right Carotid: Velocities in the right ICA are consistent with a 1-39% stenosis. Left Carotid: Velocities in the left ICA are consistent with a 1-39% stenosis. Vertebrals:  Bilateral vertebral arteries demonstrate antegrade flow. Subclavians: Normal flow hemodynamics were seen in bilateral subclavian              arteries. Bilateral lower extremities: Bilateral pedal waveforms are within normal limits at rest.     Preliminary     Cardiac Studies   2D echo 11/18/18 IMPRESSIONS    1. Left ventricular ejection fraction, by visual estimation, is 60 to 65%. The left ventricle has normal function. Normal left ventricular size. Left ventricular septal wall thickness was mildly increased. There is mildly increased left ventricular  hypertrophy.  2. Global right ventricle has normal systolic function.The right ventricular size is normal. No increase in right ventricular wall thickness.  3. Left atrial size was normal.  4. Right atrial size was normal.  5. The mitral valve is normal in structure. Mild mitral valve regurgitation. No evidence of mitral stenosis.  6. The tricuspid valve is normal in structure. Tricuspid valve regurgitation was not visualized by color flow Doppler.  7. The aortic valve is tricuspid Aortic valve regurgitation is mild by color flow Doppler. Mild aortic valve sclerosis without stenosis.  8. The pulmonic valve was normal in structure. Pulmonic valve regurgitation is not visualized by color flow Doppler.  9. The inferior vena cava is normal in size with greater than 50% respiratory variability, suggesting right atrial pressure of 3 mmHg  Cath 11/18/18 Conclusion   Prox RCA to Dist RCA lesion is 40% stenosed.  Dist RCA lesion is 60% stenosed.  RPAV lesion is 95% stenosed.  Prox Cx lesion is 80% stenosed.  Ost LAD to Prox LAD lesion is 99% stenosed.  Prox LAD to Mid LAD lesion is  80% stenosed.   1. Severe triple vessel CAD with heavily calcified vessels.  2. Severe stenosis ostial and mid LAD 3. Severe stenosis proximal circumflex 4. Severe stenosis large caliber posterolateral artery.   Recommendations: Will consult CT surgery for CABG given severe three vessel CAD in heavily calcified coronary arteries. Continue ASA and statin.     Patient Profile     73 y.o. male with hypertension, hyperlipidemia,  atrial tachycardia, sinus bradycardia, remote tobacco abuse, cervical disc disease was recently seen as outpatient with exertional chest pain. He had a prior abnormal nuclear stress test.  He underwent LHC and was found to have 3 vessel CAD, currently awaiting CABG which is scheduled for 11/23/18.  Assessment & Plan    1. Unstable angina/multivessel CAD - continue ASA, atorvastatin, Ranexa. Plan for CABG Monday.  If he develops rest pain would add heparin but for now symptomatology is stable with exertion only.  2. Essential HTN - BP mildly elevated at times, started on low dose amlodipine and continued on losartan.  Not on BB given baseline sinus bradycardia.  3. Hyperlipidemia - LDL 69. He was on atorvastatin 10mg  daily, titrate to 80mg  daily given multivessel disease.   4. Mild AI/MR by echo - follow clinically, no indication felt for replacement at present time.  5. Mild anemia - Hgb 12.2, stable  6. CKD stage II-III by labs - baseline Cr appears around 1.3-1.4.   7. H/o atrial tach - quiescent. Continue to monitor on telemetry. Not on BB due to baseline sinus bradycardia.  For questions or updates, please contact [place] Please consult [email or web address] for contact info under Cardiology/STEMI.  Signed, Donato Heinz, MD 11/21/2018, 9:48 AM

## 2018-11-21 NOTE — Plan of Care (Signed)
  Problem: Clinical Measurements: Goal: Will remain free from infection Outcome: Progressing   Problem: Activity: Goal: Risk for activity intolerance will decrease Outcome: Progressing   Problem: Safety: Goal: Ability to remain free from injury will improve Outcome: Progressing   

## 2018-11-22 ENCOUNTER — Encounter (HOSPITAL_COMMUNITY): Payer: Self-pay | Admitting: Anesthesiology

## 2018-11-22 ENCOUNTER — Inpatient Hospital Stay (HOSPITAL_COMMUNITY): Payer: PPO

## 2018-11-22 LAB — URINALYSIS, ROUTINE W REFLEX MICROSCOPIC
Bilirubin Urine: NEGATIVE
Glucose, UA: NEGATIVE mg/dL
Hgb urine dipstick: NEGATIVE
Ketones, ur: NEGATIVE mg/dL
Leukocytes,Ua: NEGATIVE
Nitrite: NEGATIVE
Protein, ur: NEGATIVE mg/dL
Specific Gravity, Urine: 1.018 (ref 1.005–1.030)
pH: 6 (ref 5.0–8.0)

## 2018-11-22 LAB — SURGICAL PCR SCREEN
MRSA, PCR: NEGATIVE
Staphylococcus aureus: NEGATIVE

## 2018-11-22 LAB — BLOOD GAS, ARTERIAL
Acid-Base Excess: 0.8 mmol/L (ref 0.0–2.0)
Bicarbonate: 24.8 mmol/L (ref 20.0–28.0)
Drawn by: 275531
FIO2: 21
O2 Saturation: 97.5 %
Patient temperature: 98.6
pCO2 arterial: 39 mmHg (ref 32.0–48.0)
pH, Arterial: 7.419 (ref 7.350–7.450)
pO2, Arterial: 97.6 mmHg (ref 83.0–108.0)

## 2018-11-22 LAB — ABO/RH: ABO/RH(D): O POS

## 2018-11-22 LAB — BASIC METABOLIC PANEL
Anion gap: 8 (ref 5–15)
BUN: 20 mg/dL (ref 8–23)
CO2: 25 mmol/L (ref 22–32)
Calcium: 8.7 mg/dL — ABNORMAL LOW (ref 8.9–10.3)
Chloride: 101 mmol/L (ref 98–111)
Creatinine, Ser: 1.36 mg/dL — ABNORMAL HIGH (ref 0.61–1.24)
GFR calc Af Amer: >60 mL/min (ref >60)
GFR calc non Af Amer: 52 mL/min — ABNORMAL LOW (ref >60)
Glucose, Bld: 85 mg/dL (ref 70–99)
Potassium: 4.3 mmol/L (ref 3.5–5.1)
Sodium: 134 mmol/L — ABNORMAL LOW (ref 135–145)

## 2018-11-22 LAB — PROTIME-INR
INR: 1.1 (ref 0.8–1.2)
Prothrombin Time: 13.8 seconds (ref 11.4–15.2)

## 2018-11-22 LAB — GLUCOSE, CAPILLARY
Glucose-Capillary: 85 mg/dL (ref 70–99)
Glucose-Capillary: 108 mg/dL — ABNORMAL HIGH (ref 70–99)

## 2018-11-22 LAB — APTT: aPTT: 31 seconds (ref 24–36)

## 2018-11-22 LAB — HEMOGLOBIN A1C
Hgb A1c MFr Bld: 5.4 % (ref 4.8–5.6)
Mean Plasma Glucose: 108.28 mg/dL

## 2018-11-22 MED ORDER — POTASSIUM CHLORIDE 2 MEQ/ML IV SOLN
80.0000 meq | INTRAVENOUS | Status: DC
  Filled 2018-11-22: qty 40

## 2018-11-22 MED ORDER — PLASMA-LYTE 148 IV SOLN
INTRAVENOUS | Status: AC
  Administered 2018-11-23: 500 mL
  Filled 2018-11-22: qty 2.5

## 2018-11-22 MED ORDER — DEXMEDETOMIDINE HCL IN NACL 400 MCG/100ML IV SOLN
0.1000 ug/kg/h | INTRAVENOUS | Status: DC
  Filled 2018-11-22: qty 100

## 2018-11-22 MED ORDER — EPINEPHRINE HCL 5 MG/250ML IV SOLN IN NS
0.0000 ug/min | INTRAVENOUS | Status: DC
  Filled 2018-11-22: qty 250

## 2018-11-22 MED ORDER — SODIUM CHLORIDE 0.9 % IV SOLN
750.0000 mg | INTRAVENOUS | Status: DC
  Filled 2018-11-22: qty 750

## 2018-11-22 MED ORDER — CHLORHEXIDINE GLUCONATE CLOTH 2 % EX PADS
6.0000 | MEDICATED_PAD | Freq: Once | CUTANEOUS | Status: AC
  Administered 2018-11-22: 6 via TOPICAL

## 2018-11-22 MED ORDER — BISACODYL 5 MG PO TBEC: 5.0000 mg | DELAYED_RELEASE_TABLET | Freq: Once | ORAL | Status: DC

## 2018-11-22 MED ORDER — DOPAMINE-DEXTROSE 3.2-5 MG/ML-% IV SOLN
0.0000 ug/kg/min | INTRAVENOUS | Status: AC
  Administered 2018-11-23: 3 ug/kg/min via INTRAVENOUS
  Filled 2018-11-22: qty 250

## 2018-11-22 MED ORDER — PHENYLEPHRINE HCL-NACL 20-0.9 MG/250ML-% IV SOLN
30.0000 ug/min | INTRAVENOUS | Status: DC
  Filled 2018-11-22: qty 250

## 2018-11-22 MED ORDER — INSULIN REGULAR(HUMAN) IN NACL 100-0.9 UT/100ML-% IV SOLN
INTRAVENOUS | Status: DC
  Filled 2018-11-22: qty 100

## 2018-11-22 MED ORDER — METOPROLOL TARTRATE 12.5 MG HALF TABLET: 12.5000 mg | ORAL_TABLET | Freq: Once | ORAL | Status: DC

## 2018-11-22 MED ORDER — NOREPINEPHRINE 4 MG/250ML-% IV SOLN
0.0000 ug/min | INTRAVENOUS | Status: DC
  Filled 2018-11-22: qty 250

## 2018-11-22 MED ORDER — SODIUM CHLORIDE 0.9 % IV SOLN
INTRAVENOUS | Status: DC
  Filled 2018-11-22: qty 30

## 2018-11-22 MED ORDER — TRANEXAMIC ACID (OHS) BOLUS VIA INFUSION
15.0000 mg/kg | INTRAVENOUS | Status: AC
  Administered 2018-11-23: 1068 mg via INTRAVENOUS
  Filled 2018-11-22: qty 1068

## 2018-11-22 MED ORDER — VANCOMYCIN HCL 10 G IV SOLR
1250.0000 mg | INTRAVENOUS | Status: AC
  Administered 2018-11-23: 1250 mg via INTRAVENOUS
  Filled 2018-11-22: qty 1250

## 2018-11-22 MED ORDER — NITROGLYCERIN IN D5W 200-5 MCG/ML-% IV SOLN
2.0000 ug/min | INTRAVENOUS | Status: DC
  Filled 2018-11-22: qty 250

## 2018-11-22 MED ORDER — SODIUM CHLORIDE 0.9 % IV SOLN
1.5000 g | INTRAVENOUS | Status: AC
  Administered 2018-11-23: 1.5 g via INTRAVENOUS
  Administered 2018-11-23: .75 g via INTRAVENOUS
  Filled 2018-11-22: qty 1.5

## 2018-11-22 MED ORDER — TRANEXAMIC ACID (OHS) PUMP PRIME SOLUTION
2.0000 mg/kg | INTRAVENOUS | Status: DC
  Filled 2018-11-22: qty 1.42

## 2018-11-22 MED ORDER — TRANEXAMIC ACID 1000 MG/10ML IV SOLN
1.5000 mg/kg/h | INTRAVENOUS | Status: AC
  Administered 2018-11-23: 1.5 mg/kg/h via INTRAVENOUS
  Filled 2018-11-22 (×2): qty 25

## 2018-11-22 MED ORDER — CHLORHEXIDINE GLUCONATE CLOTH 2 % EX PADS
6.0000 | MEDICATED_PAD | Freq: Once | CUTANEOUS | Status: AC
  Administered 2018-11-23: 6 via TOPICAL

## 2018-11-22 MED ORDER — TEMAZEPAM 15 MG PO CAPS: 15.0000 mg | ORAL_CAPSULE | Freq: Every evening | ORAL | Status: DC | PRN

## 2018-11-22 MED ORDER — MILRINONE LACTATE IN DEXTROSE 20-5 MG/100ML-% IV SOLN
0.3000 ug/kg/min | INTRAVENOUS | Status: DC
  Filled 2018-11-22: qty 100

## 2018-11-22 MED ORDER — MAGNESIUM SULFATE 50 % IJ SOLN
40.0000 meq | INTRAMUSCULAR | Status: DC
  Filled 2018-11-22: qty 9.85

## 2018-11-22 MED ORDER — CHLORHEXIDINE GLUCONATE 0.12 % MT SOLN
15.0000 mL | Freq: Once | OROMUCOSAL | Status: AC
  Administered 2018-11-23: 15 mL via OROMUCOSAL
  Filled 2018-11-22: qty 15

## 2018-11-22 NOTE — Anesthesia Preprocedure Evaluation (Addendum)
Anesthesia Evaluation  Patient identified by MRN, date of birth, ID band Patient awake    Reviewed: Allergy & Precautions, NPO status , Patient's Chart, lab work & pertinent test results  Airway Mallampati: I  TM Distance: >3 FB Neck ROM: Full    Dental  (+) Teeth Intact, Dental Advisory Given   Pulmonary neg pulmonary ROS, former smoker,    breath sounds clear to auscultation       Cardiovascular hypertension, + CAD   Rhythm:Regular Rate:Normal     Neuro/Psych negative neurological ROS  negative psych ROS   GI/Hepatic negative GI ROS, Neg liver ROS,   Endo/Other    Renal/GU Renal disease     Musculoskeletal  (+) Arthritis ,   Abdominal Normal abdominal exam  (+)   Peds  Hematology negative hematology ROS (+)   Anesthesia Other Findings   Reproductive/Obstetrics                            Echo: 1. Left ventricular ejection fraction, by visual estimation, is 60 to 65%. The left ventricle has normal function. Normal left ventricular size. Left ventricular septal wall thickness was mildly increased. There is mildly increased left ventricular  hypertrophy.  2. Global right ventricle has normal systolic function.The right ventricular size is normal. No increase in right ventricular wall thickness.  3. Left atrial size was normal.  4. Right atrial size was normal.  5. The mitral valve is normal in structure. Mild mitral valve regurgitation. No evidence of mitral stenosis.  6. The tricuspid valve is normal in structure. Tricuspid valve regurgitation was not visualized by color flow Doppler.  7. The aortic valve is tricuspid Aortic valve regurgitation is mild by color flow Doppler. Mild aortic valve sclerosis without stenosis.  8. The pulmonic valve was normal in structure. Pulmonic valve regurgitation is not visualized by color flow Doppler.  9. The inferior vena cava is normal in size with greater  than 50% respiratory variability, suggesting right atrial pressure of 3 mmHg.   Anesthesia Physical Anesthesia Plan  ASA: IV  Anesthesia Plan: General   Post-op Pain Management:    Induction: Intravenous  PONV Risk Score and Plan: 3 and Ondansetron and Treatment may vary due to age or medical condition  Airway Management Planned: Oral ETT  Additional Equipment: Arterial line, CVP, PA Cath, TEE and Ultrasound Guidance Line Placement  Intra-op Plan:   Post-operative Plan: Post-operative intubation/ventilation  Informed Consent: I have reviewed the patients History and Physical, chart, labs and discussed the procedure including the risks, benefits and alternatives for the proposed anesthesia with the patient or authorized representative who has indicated his/her understanding and acceptance.       Plan Discussed with: CRNA  Anesthesia Plan Comments:        Anesthesia Quick Evaluation

## 2018-11-22 NOTE — Progress Notes (Signed)
Progress Note  Patient Name: Jeff Graves Date of Encounter: 11/22/2018  Primary Cardiologist: Lauree Chandler, MD  Subjective   Denies any chest pain or shortness of breath  Inpatient Medications    Scheduled Meds:  amLODipine  2.5 mg Oral Daily   aspirin EC  81 mg Oral Daily   atorvastatin  80 mg Oral q1800   influenza vaccine adjuvanted  0.5 mL Intramuscular Tomorrow-1000   losartan  50 mg Oral Daily   ranolazine  500 mg Oral BID   sodium chloride flush  3 mL Intravenous Q12H   sodium chloride flush  3 mL Intravenous Q12H   Continuous Infusions:  sodium chloride     PRN Meds: sodium chloride, acetaminophen, ondansetron (ZOFRAN) IV, sodium chloride flush   Vital Signs    Vitals:   11/21/18 1703 11/21/18 1958 11/22/18 0123 11/22/18 0455  BP: 140/80 138/79 112/64 126/85  Pulse: (!) 53 62 (!) 55 (!) 59  Resp: 18 18 18 18   Temp: 97.7 F (36.5 C) 99.2 F (37.3 C) 98.6 F (37 C) 98.1 F (36.7 C)  TempSrc: Oral Oral Oral Oral  SpO2: 100% 97% 98% 99%  Weight:   71.2 kg   Height:        Intake/Output Summary (Last 24 hours) at 11/22/2018 [id number] Last data filed at 11/21/2018 2054 Gross per 24 hour  Intake 1200 ml  Output --  Net 1200 ml   Last 3 Weights 11/22/2018 11/21/2018 11/20/2018  Weight (lbs) 156 lb 14.4 oz 155 lb 8 oz 157 lb 4.8 oz  Weight (kg) 71.169 kg 70.534 kg 71.351 kg     Telemetry    Sinus bradycardia with rate in 50-60s- Personally Reviewed  Physical Exam   GEN: No acute distress.  HEENT: Normocephalic, atraumatic, sclera non-icteric. Neck: No JVD Cardiac: RRR no murmurs, rubs, or gallops.  Respiratory: Clear to auscultation bilaterally. Breathing is unlabored. GI: Soft, nontende MS: no deformity. Extremities: No edema Neuro:  AAOx3. Follows commands. Psych:  Responds to questions appropriately with a normal affect.  Labs    High Sensitivity Troponin:  No results for input(s): TROPONINIHS in the last 720 hours.     Cardiac EnzymesNo results for input(s): TROPONINI in the last 168 hours. No results for input(s): TROPIPOC in the last 168 hours.   Chemistry Recent Labs  Lab 11/19/18 0327 11/20/18 1026 11/21/18 0341  NA 136 135 134*  K 4.5 4.4 4.2  CL 103 101 100  CO2 23 27 25   GLUCOSE 89 88 92  BUN 16 15 18   CREATININE 1.33* 1.23 1.26*  CALCIUM 8.5* 8.8* 8.8*  PROT  --  5.9*  --   ALBUMIN  --  3.3*  --   AST  --  16  --   ALT  --  16  --   ALKPHOS  --  55  --   BILITOT  --  0.8  --   GFRNONAA 53* 59* 57*  GFRAA >60 >60 >60  ANIONGAP 10 7 9      Hematology Recent Labs  Lab 11/19/18 0327 11/20/18 1026 11/21/18 0341  WBC 7.4 6.0 6.6  RBC 3.57* 3.76* 3.71*  HGB 11.9* 12.2* 12.2*  HCT 34.0* 36.1* 34.4*  MCV 95.2 96.0 92.7  MCH 33.3 32.4 32.9  MCHC 35.0 33.8 35.5  RDW 12.8 12.7 12.6  PLT 231 260 243   Radiology    Vas US Doppler Pre Cabg  Result Date: 11/20/2018 PREOPERATIVE VASCULAR EVALUATION  Indications:  Pre-surgical evalution. Risk Factors:     Hypertension, hyperlipidemia, coronary artery disease. Comparison Study: No prior study. Performing Technologist: Maudry Mayhew MHA, RDMS, RVT, RDCS  Examination Guidelines: A complete evaluation includes B-mode imaging, spectral Doppler, color Doppler, and power Doppler as needed of all accessible portions of each vessel. Bilateral testing is considered an integral part of a complete examination. Limited examinations for reoccurring indications may be performed as noted.  Right Carotid Findings: +----------+--------+-------+--------+--------------------------------+--------+             PSV cm/s EDV     Stenosis Describe                         Comments                       cm/s                                                        +----------+--------+-------+--------+--------------------------------+--------+  CCA Prox   85       17               smooth and heterogenous                     +----------+--------+-------+--------+--------------------------------+--------+  CCA Distal 78       15               heterogenous and irregular                 +----------+--------+-------+--------+--------------------------------+--------+  ICA Prox   48       14               smooth, heterogenous and                                                         calcific                                   +----------+--------+-------+--------+--------------------------------+--------+  ICA Distal 77       24                                                          +----------+--------+-------+--------+--------------------------------+--------+  ECA        74       7                smooth and heterogenous                    +----------+--------+-------+--------+--------------------------------+--------+ Portions of this table do not appear on this page. +----------+--------+-------+----------------+------------+             PSV cm/s EDV cms Describe         Arm Pressure  +----------+--------+-------+----------------+------------+  Subclavian 137  Multiphasic, WNL 126           +----------+--------+-------+----------------+------------+ +---------+--------+--+--------+--+---------+  Vertebral PSV cm/s 76 EDV cm/s 21 Antegrade  +---------+--------+--+--------+--+---------+ Left Carotid Findings: +----------+--------+--------+--------+--------------------------+--------+             PSV cm/s EDV cm/s Stenosis Describe                   Comments  +----------+--------+--------+--------+--------------------------+--------+  CCA Prox   135      21                smooth and heterogenous              +----------+--------+--------+--------+--------------------------+--------+  CCA Distal 80       15                smooth and heterogenous              +----------+--------+--------+--------+--------------------------+--------+  ICA Prox   70       13                irregular and heterogenous            +----------+--------+--------+--------+--------------------------+--------+  ICA Distal 77       24                                                     +----------+--------+--------+--------+--------------------------+--------+  ECA        80       8                 smooth and heterogenous              +----------+--------+--------+--------+--------------------------+--------+ +----------+--------+--------+----------------+------------+  Subclavian PSV cm/s EDV cm/s Describe         Arm Pressure  +----------+--------+--------+----------------+------------+             98                Multiphasic, WNL               +----------+--------+--------+----------------+------------+ +---------+--------+--+--------+--+---------+  Vertebral PSV cm/s 30 EDV cm/s 10 Antegrade  +---------+--------+--+--------+--+---------+ ABI Findings: +--------+------------------+-----+---------+--------+  Right    Rt Pressure (mmHg) Index Waveform  Comment   +--------+------------------+-----+---------+--------+  Brachial 126                      triphasic           +--------+------------------+-----+---------+--------+  PTA                               triphasic           +--------+------------------+-----+---------+--------+  DP                                triphasic           +--------+------------------+-----+---------+--------+ +--------+------------------+-----+---------+----------------------------------+  Left     Lt Pressure (mmHg) Index Waveform  Comment                             +--------+------------------+-----+---------+----------------------------------+  Brachial  triphasic Pressure not obtained due to IV                                                  location.                           +--------+------------------+-----+---------+----------------------------------+  PTA                               triphasic                                      +--------+------------------+-----+---------+----------------------------------+  DP                                triphasic                                     +--------+------------------+-----+---------+----------------------------------+ Right Doppler Findings: +-----------+--------+-----+---------+--------------------+  Site        Pressure Index Doppler   Comments              +-----------+--------+-----+---------+--------------------+  Brachial    126            triphasic                       +-----------+--------+-----+---------+--------------------+  Radial                     triphasic                       +-----------+--------+-----+---------+--------------------+  Ulnar                      triphasic                       +-----------+--------+-----+---------+--------------------+  Palmar Arch                          Within normal limits  +-----------+--------+-----+---------+--------------------+  Left Doppler Findings: +-----------+--------+-----+---------+-----------------------------------------+  Site        Pressure Index Doppler   Comments                                   +-----------+--------+-----+---------+-----------------------------------------+  Brachial                   triphasic Pressure not obtained due to IV location.  +-----------+--------+-----+---------+-----------------------------------------+  Radial                     triphasic                                            +-----------+--------+-----+---------+-----------------------------------------+  Ulnar  triphasic                                            +-----------+--------+-----+---------+-----------------------------------------+  Palmar Arch                          Signal obliterates with radial                                                   compression, is unaffected with ulnar                                            compression                                 +-----------+--------+-----+---------+-----------------------------------------+  Summary: Right Carotid: Velocities in the right ICA are consistent with a 1-39% stenosis. Left Carotid: Velocities in the left ICA are consistent with a 1-39% stenosis. Vertebrals:  Bilateral vertebral arteries demonstrate antegrade flow. Subclavians: Normal flow hemodynamics were seen in bilateral subclavian              arteries. Bilateral lower extremities: Bilateral pedal waveforms are within normal limits at rest.     Preliminary     Cardiac Studies   2D echo 11/18/18 IMPRESSIONS    1. Left ventricular ejection fraction, by visual estimation, is 60 to 65%. The left ventricle has normal function. Normal left ventricular size. Left ventricular septal wall thickness was mildly increased. There is mildly increased left ventricular  hypertrophy.  2. Global right ventricle has normal systolic function.The right ventricular size is normal. No increase in right ventricular wall thickness.  3. Left atrial size was normal.  4. Right atrial size was normal.  5. The mitral valve is normal in structure. Mild mitral valve regurgitation. No evidence of mitral stenosis.  6. The tricuspid valve is normal in structure. Tricuspid valve regurgitation was not visualized by color flow Doppler.  7. The aortic valve is tricuspid Aortic valve regurgitation is mild by color flow Doppler. Mild aortic valve sclerosis without stenosis.  8. The pulmonic valve was normal in structure. Pulmonic valve regurgitation is not visualized by color flow Doppler.  9. The inferior vena cava is normal in size with greater than 50% respiratory variability, suggesting right atrial pressure of 3 mmHg  Cath 11/18/18 Conclusion   Prox RCA to Dist RCA lesion is 40% stenosed.  Dist RCA lesion is 60% stenosed.  RPAV lesion is 95% stenosed.  Prox Cx lesion is 80% stenosed.  Ost LAD to Prox LAD lesion is 99% stenosed.  Prox LAD to Mid LAD lesion is  80% stenosed.   1. Severe triple vessel CAD with heavily calcified vessels.  2. Severe stenosis ostial and mid LAD 3. Severe stenosis proximal circumflex 4. Severe stenosis large caliber posterolateral artery.   Recommendations: Will consult CT surgery for CABG given severe three vessel CAD in heavily calcified coronary arteries. Continue ASA and statin.     Patient Profile     73 y.o. male with hypertension, hyperlipidemia,  atrial tachycardia, sinus bradycardia, remote tobacco abuse, cervical disc disease was recently seen as outpatient with exertional chest pain. He had a prior abnormal nuclear stress test.  He underwent LHC and was found to have 3 vessel CAD, currently awaiting CABG which is scheduled for 11/23/18.  Assessment & Plan    1. Unstable angina/multivessel CAD - continue ASA, atorvastatin, Ranexa. Plan for CABG tomorrow.  If he develops rest pain would add heparin but for now symptomatology is stable with exertion only.  2. Essential HTN - BP mildly elevated at times, started on low dose amlodipine and continued on losartan.  Not on BB given baseline sinus bradycardia.  3. Hyperlipidemia - LDL 69. He was on atorvastatin 10mg  daily, titrate to 80mg  daily given multivessel disease.    4. Mild AI/MR by echo - follow clinically, no indication felt for replacement at present time.  5. Mild anemia - Hgb 12.2, stable  6. CKD stage II-III by labs - baseline Cr appears around 1.3-1.4.   7. H/o atrial tach - quiescent. Continue to monitor on telemetry. Not on BB due to baseline sinus bradycardia.  For questions or updates, please contact [place] Please consult [email or web address] for contact info under Cardiology/STEMI.  Signed, Donato Heinz, MD 11/22/2018, 7:22 AM

## 2018-11-22 NOTE — Progress Notes (Signed)
Placed orders for addition lab work for patient per previous electronic orders for surgery tomorrow.

## 2018-11-23 ENCOUNTER — Inpatient Hospital Stay (HOSPITAL_COMMUNITY): Payer: PPO

## 2018-11-23 ENCOUNTER — Inpatient Hospital Stay (HOSPITAL_COMMUNITY): Payer: PPO | Admitting: Certified Registered Nurse Anesthetist

## 2018-11-23 ENCOUNTER — Inpatient Hospital Stay (HOSPITAL_COMMUNITY)
Admission: RE | Disposition: A | Discharge status: Home / Self Care | Payer: Self-pay | Source: Home / Self Care | Attending: Internal Medicine

## 2018-11-23 DIAGNOSIS — I2510 Atherosclerotic heart disease of native coronary artery without angina pectoris: Secondary | ICD-10-CM

## 2018-11-23 HISTORY — PX: CORONARY ARTERY BYPASS GRAFT: SHX141

## 2018-11-23 HISTORY — PX: TEE WITHOUT CARDIOVERSION: SHX5443

## 2018-11-23 LAB — BASIC METABOLIC PANEL
Anion gap: 10 (ref 5–15)
BUN: 19 mg/dL (ref 8–23)
CO2: 19 mmol/L — ABNORMAL LOW (ref 22–32)
Calcium: 8 mg/dL — ABNORMAL LOW (ref 8.9–10.3)
Chloride: 105 mmol/L (ref 98–111)
Creatinine, Ser: 1.37 mg/dL — ABNORMAL HIGH (ref 0.61–1.24)
GFR calc Af Amer: 60 mL/min — ABNORMAL LOW (ref >60)
GFR calc non Af Amer: 52 mL/min — ABNORMAL LOW (ref >60)
Glucose, Bld: 133 mg/dL — ABNORMAL HIGH (ref 70–99)
Potassium: 4.3 mmol/L (ref 3.5–5.1)
Sodium: 134 mmol/L — ABNORMAL LOW (ref 135–145)

## 2018-11-23 LAB — POCT I-STAT 7, (LYTES, BLD GAS, ICA,H+H)
Acid-base deficit: 1 mmol/L (ref 0.0–2.0)
Acid-base deficit: 2 mmol/L (ref 0.0–2.0)
Acid-base deficit: 4 mmol/L — ABNORMAL HIGH (ref 0.0–2.0)
Bicarbonate: 25.1 mmol/L (ref 20.0–28.0)
Bicarbonate: 24.9 mmol/L (ref 20.0–28.0)
Bicarbonate: 22.6 mmol/L (ref 20.0–28.0)
Bicarbonate: 21.5 mmol/L (ref 20.0–28.0)
Calcium, Ion: 0.95 mmol/L — ABNORMAL LOW (ref 1.15–1.40)
Calcium, Ion: 1.25 mmol/L (ref 1.15–1.40)
Calcium, Ion: 1.17 mmol/L (ref 1.15–1.40)
Calcium, Ion: 1.21 mmol/L (ref 1.15–1.40)
HCT: 24 % — ABNORMAL LOW (ref 39.0–52.0)
HCT: 30 % — ABNORMAL LOW (ref 39.0–52.0)
HCT: 25 % — ABNORMAL LOW (ref 39.0–52.0)
HCT: 25 % — ABNORMAL LOW (ref 39.0–52.0)
Hemoglobin: 8.2 g/dL — ABNORMAL LOW (ref 13.0–17.0)
Hemoglobin: 10.2 g/dL — ABNORMAL LOW (ref 13.0–17.0)
Hemoglobin: 8.5 g/dL — ABNORMAL LOW (ref 13.0–17.0)
Hemoglobin: 8.5 g/dL — ABNORMAL LOW (ref 13.0–17.0)
O2 Saturation: 100 %
O2 Saturation: 100 %
O2 Saturation: 99 %
O2 Saturation: 97 %
Patient temperature: 36.3
Patient temperature: 36.8
Potassium: 4.4 mmol/L (ref 3.5–5.1)
Potassium: 4.5 mmol/L (ref 3.5–5.1)
Potassium: 4.7 mmol/L (ref 3.5–5.1)
Potassium: 4.4 mmol/L (ref 3.5–5.1)
Sodium: 137 mmol/L (ref 135–145)
Sodium: 134 mmol/L — ABNORMAL LOW (ref 135–145)
Sodium: 135 mmol/L (ref 135–145)
Sodium: 135 mmol/L (ref 135–145)
TCO2: 26 mmol/L (ref 22–32)
TCO2: 26 mmol/L (ref 22–32)
TCO2: 24 mmol/L (ref 22–32)
TCO2: 23 mmol/L (ref 22–32)
pCO2 arterial: 40.9 mmHg (ref 32.0–48.0)
pCO2 arterial: 43.4 mmHg (ref 32.0–48.0)
pCO2 arterial: 36 mmHg (ref 32.0–48.0)
pCO2 arterial: 41.4 mmHg (ref 32.0–48.0)
pH, Arterial: 7.397 (ref 7.350–7.450)
pH, Arterial: 7.367 (ref 7.350–7.450)
pH, Arterial: 7.402 (ref 7.350–7.450)
pH, Arterial: 7.324 — ABNORMAL LOW (ref 7.350–7.450)
pO2, Arterial: 440 mmHg — ABNORMAL HIGH (ref 83.0–108.0)
pO2, Arterial: 485 mmHg — ABNORMAL HIGH (ref 83.0–108.0)
pO2, Arterial: 143 mmHg — ABNORMAL HIGH (ref 83.0–108.0)
pO2, Arterial: 92 mmHg (ref 83.0–108.0)

## 2018-11-23 LAB — POCT I-STAT, CHEM 8
BUN: 15 mg/dL (ref 8–23)
BUN: 20 mg/dL (ref 8–23)
BUN: 18 mg/dL (ref 8–23)
BUN: 19 mg/dL (ref 8–23)
Calcium, Ion: 0.92 mmol/L — ABNORMAL LOW (ref 1.15–1.40)
Calcium, Ion: 1.16 mmol/L (ref 1.15–1.40)
Calcium, Ion: 1.01 mmol/L — ABNORMAL LOW (ref 1.15–1.40)
Calcium, Ion: 1.24 mmol/L (ref 1.15–1.40)
Chloride: 100 mmol/L (ref 98–111)
Chloride: 96 mmol/L — ABNORMAL LOW (ref 98–111)
Chloride: 96 mmol/L — ABNORMAL LOW (ref 98–111)
Chloride: 99 mmol/L (ref 98–111)
Creatinine, Ser: 0.9 mg/dL (ref 0.61–1.24)
Creatinine, Ser: 1 mg/dL (ref 0.61–1.24)
Creatinine, Ser: 1.1 mg/dL (ref 0.61–1.24)
Creatinine, Ser: 1.1 mg/dL (ref 0.61–1.24)
Glucose, Bld: 82 mg/dL (ref 70–99)
Glucose, Bld: 96 mg/dL (ref 70–99)
Glucose, Bld: 128 mg/dL — ABNORMAL HIGH (ref 70–99)
Glucose, Bld: 114 mg/dL — ABNORMAL HIGH (ref 70–99)
HCT: 25 % — ABNORMAL LOW (ref 39.0–52.0)
HCT: 31 % — ABNORMAL LOW (ref 39.0–52.0)
HCT: 25 % — ABNORMAL LOW (ref 39.0–52.0)
HCT: 27 % — ABNORMAL LOW (ref 39.0–52.0)
Hemoglobin: 10.5 g/dL — ABNORMAL LOW (ref 13.0–17.0)
Hemoglobin: 8.5 g/dL — ABNORMAL LOW (ref 13.0–17.0)
Hemoglobin: 8.5 g/dL — ABNORMAL LOW (ref 13.0–17.0)
Hemoglobin: 9.2 g/dL — ABNORMAL LOW (ref 13.0–17.0)
Potassium: 4.3 mmol/L (ref 3.5–5.1)
Potassium: 4.4 mmol/L (ref 3.5–5.1)
Potassium: 4.6 mmol/L (ref 3.5–5.1)
Potassium: 4.5 mmol/L (ref 3.5–5.1)
Sodium: 134 mmol/L — ABNORMAL LOW (ref 135–145)
Sodium: 136 mmol/L (ref 135–145)
Sodium: 130 mmol/L — ABNORMAL LOW (ref 135–145)
Sodium: 134 mmol/L — ABNORMAL LOW (ref 135–145)
TCO2: 24 mmol/L (ref 22–32)
TCO2: 28 mmol/L (ref 22–32)
TCO2: 24 mmol/L (ref 22–32)
TCO2: 26 mmol/L (ref 22–32)

## 2018-11-23 LAB — GLUCOSE, CAPILLARY
Glucose-Capillary: 102 mg/dL — ABNORMAL HIGH (ref 70–99)
Glucose-Capillary: 107 mg/dL — ABNORMAL HIGH (ref 70–99)
Glucose-Capillary: 140 mg/dL — ABNORMAL HIGH (ref 70–99)
Glucose-Capillary: 148 mg/dL — ABNORMAL HIGH (ref 70–99)
Glucose-Capillary: 90 mg/dL (ref 70–99)

## 2018-11-23 LAB — CBC
HCT: 27.7 % — ABNORMAL LOW (ref 39.0–52.0)
HCT: 25.4 % — ABNORMAL LOW (ref 39.0–52.0)
Hemoglobin: 9.5 g/dL — ABNORMAL LOW (ref 13.0–17.0)
Hemoglobin: 8.8 g/dL — ABNORMAL LOW (ref 13.0–17.0)
MCH: 32.8 pg (ref 26.0–34.0)
MCH: 33.3 pg (ref 26.0–34.0)
MCHC: 34.3 g/dL (ref 30.0–36.0)
MCHC: 34.6 g/dL (ref 30.0–36.0)
MCV: 95.5 fL (ref 80.0–100.0)
MCV: 96.2 fL (ref 80.0–100.0)
Platelets: 150 10*3/uL (ref 150–400)
Platelets: 149 10*3/uL — ABNORMAL LOW (ref 150–400)
RBC: 2.9 MIL/uL — ABNORMAL LOW (ref 4.22–5.81)
RBC: 2.64 MIL/uL — ABNORMAL LOW (ref 4.22–5.81)
RDW: 12.4 % (ref 11.5–15.5)
RDW: 12.6 % (ref 11.5–15.5)
WBC: 17.4 10*3/uL — ABNORMAL HIGH (ref 4.0–10.5)
WBC: 13.9 10*3/uL — ABNORMAL HIGH (ref 4.0–10.5)
nRBC: 0 % (ref 0.0–0.2)
nRBC: 0 % (ref 0.0–0.2)

## 2018-11-23 LAB — ECHO INTRAOPERATIVE TEE
Height: 70 in
Weight: 2499.2 oz

## 2018-11-23 LAB — POCT I-STAT 4, (NA,K, GLUC, HGB,HCT)
Glucose, Bld: 90 mg/dL (ref 70–99)
HCT: 37 % — ABNORMAL LOW (ref 39.0–52.0)
Hemoglobin: 12.6 g/dL — ABNORMAL LOW (ref 13.0–17.0)
Potassium: 4.1 mmol/L (ref 3.5–5.1)
Sodium: 134 mmol/L — ABNORMAL LOW (ref 135–145)

## 2018-11-23 LAB — PROTIME-INR
INR: 1.6 — ABNORMAL HIGH (ref 0.8–1.2)
Prothrombin Time: 18.6 seconds — ABNORMAL HIGH (ref 11.4–15.2)

## 2018-11-23 LAB — MAGNESIUM: Magnesium: 3.1 mg/dL — ABNORMAL HIGH (ref 1.7–2.4)

## 2018-11-23 LAB — PREPARE RBC (CROSSMATCH)

## 2018-11-23 LAB — APTT: aPTT: 31 seconds (ref 24–36)

## 2018-11-23 LAB — PLATELET COUNT: Platelets: 190 10*3/uL (ref 150–400)

## 2018-11-23 LAB — HEMOGLOBIN AND HEMATOCRIT, BLOOD
HCT: 25.6 % — ABNORMAL LOW (ref 39.0–52.0)
Hemoglobin: 9.1 g/dL — ABNORMAL LOW (ref 13.0–17.0)

## 2018-11-23 SURGERY — CORONARY ARTERY BYPASS GRAFTING (CABG)
Anesthesia: General | Site: Chest

## 2018-11-23 MED ORDER — ACETAMINOPHEN 500 MG PO TABS
1000.0000 mg | ORAL_TABLET | Freq: Four times a day (QID) | ORAL | Status: DC
  Administered 2018-11-24 – 2018-11-25 (×5): 1000 mg via ORAL
  Filled 2018-11-23 (×5): qty 2

## 2018-11-23 MED ORDER — ALBUMIN HUMAN 5 % IV SOLN
INTRAVENOUS | Status: DC | PRN
  Administered 2018-11-23 (×4): via INTRAVENOUS

## 2018-11-23 MED ORDER — HEMOSTATIC AGENTS (NO CHARGE) OPTIME
TOPICAL | Status: DC | PRN
  Administered 2018-11-23 (×2): 1 via TOPICAL

## 2018-11-23 MED ORDER — INSULIN REGULAR BOLUS VIA INFUSION
0.0000 [IU] | Freq: Three times a day (TID) | INTRAVENOUS | Status: DC
  Filled 2018-11-23: qty 10

## 2018-11-23 MED ORDER — SODIUM CHLORIDE 0.9 % IV SOLN
INTRAVENOUS | Status: DC | PRN
  Administered 2018-11-23: 10 ug/min via INTRAVENOUS

## 2018-11-23 MED ORDER — VASOPRESSIN 20 UNIT/ML IV SOLN
INTRAVENOUS | Status: AC
  Filled 2018-11-23: qty 1

## 2018-11-23 MED ORDER — ORAL CARE MOUTH RINSE
15.0000 mL | OROMUCOSAL | Status: DC
  Administered 2018-11-23 (×2): 15 mL via OROMUCOSAL

## 2018-11-23 MED ORDER — HEPARIN SODIUM (PORCINE) 1000 UNIT/ML IJ SOLN
INTRAMUSCULAR | Status: AC
  Filled 2018-11-23: qty 1

## 2018-11-23 MED ORDER — ACETAMINOPHEN 160 MG/5ML PO SOLN: 1000.0000 mg | Freq: Four times a day (QID) | ORAL | Status: DC

## 2018-11-23 MED ORDER — LACTATED RINGERS IV SOLN: INTRAVENOUS | Status: DC

## 2018-11-23 MED ORDER — ASPIRIN EC 325 MG PO TBEC
325.0000 mg | DELAYED_RELEASE_TABLET | Freq: Every day | ORAL | Status: DC
  Administered 2018-11-24 – 2018-11-25 (×2): 325 mg via ORAL
  Filled 2018-11-23 (×2): qty 1

## 2018-11-23 MED ORDER — SODIUM CHLORIDE 0.45 % IV SOLN: INTRAVENOUS | Status: DC | PRN

## 2018-11-23 MED ORDER — GLYCOPYRROLATE 0.2 MG/ML IJ SOLN
INTRAMUSCULAR | Status: DC | PRN
  Administered 2018-11-23: 0.1 mg via INTRAVENOUS

## 2018-11-23 MED ORDER — SODIUM CHLORIDE 0.9 % IV SOLN
INTRAVENOUS | Status: DC
  Administered 2018-11-23: 14:00:00 via INTRAVENOUS

## 2018-11-23 MED ORDER — METOCLOPRAMIDE HCL 5 MG/ML IJ SOLN
10.0000 mg | Freq: Four times a day (QID) | INTRAMUSCULAR | Status: AC
  Administered 2018-11-23 – 2018-11-24 (×4): 10 mg via INTRAVENOUS
  Filled 2018-11-23 (×3): qty 2

## 2018-11-23 MED ORDER — LACTATED RINGERS IV SOLN
INTRAVENOUS | Status: DC | PRN
  Administered 2018-11-23: 07:00:00 via INTRAVENOUS

## 2018-11-23 MED ORDER — DEXMEDETOMIDINE HCL IN NACL 400 MCG/100ML IV SOLN
INTRAVENOUS | Status: DC | PRN
  Administered 2018-11-23: .3 ug/kg/h via INTRAVENOUS

## 2018-11-23 MED ORDER — MIDAZOLAM HCL (PF) 10 MG/2ML IJ SOLN
INTRAMUSCULAR | Status: AC
  Filled 2018-11-23: qty 2

## 2018-11-23 MED ORDER — OXYCODONE HCL 5 MG PO TABS
5.0000 mg | ORAL_TABLET | ORAL | Status: DC | PRN
  Administered 2018-11-23 – 2018-11-24 (×2): 10 mg via ORAL
  Filled 2018-11-23 (×2): qty 2

## 2018-11-23 MED ORDER — MAGNESIUM SULFATE 4 GM/100ML IV SOLN
4.0000 g | Freq: Once | INTRAVENOUS | Status: AC
  Administered 2018-11-23: 4 g via INTRAVENOUS
  Filled 2018-11-23: qty 100

## 2018-11-23 MED ORDER — PROTAMINE SULFATE 10 MG/ML IV SOLN
INTRAVENOUS | Status: AC
  Filled 2018-11-23: qty 25

## 2018-11-23 MED ORDER — CALCIUM CHLORIDE 10 % IV SOLN
INTRAVENOUS | Status: AC
  Filled 2018-11-23: qty 10

## 2018-11-23 MED ORDER — PANTOPRAZOLE SODIUM 40 MG PO TBEC
40.0000 mg | DELAYED_RELEASE_TABLET | Freq: Every day | ORAL | Status: DC
  Administered 2018-11-25: 40 mg via ORAL
  Filled 2018-11-23: qty 1

## 2018-11-23 MED ORDER — MIDAZOLAM HCL 2 MG/2ML IJ SOLN
2.0000 mg | INTRAMUSCULAR | Status: DC | PRN
  Administered 2018-11-23: 2 mg via INTRAVENOUS
  Filled 2018-11-23: qty 2

## 2018-11-23 MED ORDER — BISACODYL 10 MG RE SUPP: 10.0000 mg | Freq: Every day | RECTAL | Status: DC

## 2018-11-23 MED ORDER — NITROGLYCERIN IN D5W 200-5 MCG/ML-% IV SOLN: 0.0000 ug/min | INTRAVENOUS | Status: DC

## 2018-11-23 MED ORDER — PROTAMINE SULFATE 10 MG/ML IV SOLN
INTRAVENOUS | Status: DC | PRN
  Administered 2018-11-23 (×2): 100 mg via INTRAVENOUS
  Administered 2018-11-23: 50 mg via INTRAVENOUS

## 2018-11-23 MED ORDER — ACETAMINOPHEN 650 MG RE SUPP
650.0000 mg | Freq: Once | RECTAL | Status: AC
  Administered 2018-11-23: 14:00:00 650 mg via RECTAL

## 2018-11-23 MED ORDER — ALBUMIN HUMAN 5 % IV SOLN
250.0000 mL | INTRAVENOUS | Status: AC | PRN
  Administered 2018-11-23 (×4): 12.5 g via INTRAVENOUS
  Filled 2018-11-23: qty 500

## 2018-11-23 MED ORDER — LIDOCAINE 2% (20 MG/ML) 5 ML SYRINGE
INTRAMUSCULAR | Status: AC
  Filled 2018-11-23: qty 5

## 2018-11-23 MED ORDER — DOCUSATE SODIUM 100 MG PO CAPS
200.0000 mg | ORAL_CAPSULE | Freq: Every day | ORAL | Status: DC
  Administered 2018-11-24 – 2018-11-25 (×2): 200 mg via ORAL
  Filled 2018-11-23 (×2): qty 2

## 2018-11-23 MED ORDER — CHLORHEXIDINE GLUCONATE 0.12% ORAL RINSE (MEDLINE KIT): 15.0000 mL | Freq: Two times a day (BID) | OROMUCOSAL | Status: DC

## 2018-11-23 MED ORDER — VASOPRESSIN 20 UNIT/ML IV SOLN
INTRAVENOUS | Status: DC | PRN
  Administered 2018-11-23 (×2): 1 [IU] via INTRAVENOUS
  Administered 2018-11-23: 2 [IU] via INTRAVENOUS

## 2018-11-23 MED ORDER — DEXMEDETOMIDINE HCL IN NACL 200 MCG/50ML IV SOLN: 0.0000 ug/kg/h | INTRAVENOUS | Status: DC

## 2018-11-23 MED ORDER — ROCURONIUM BROMIDE 100 MG/10ML IV SOLN
INTRAVENOUS | Status: DC | PRN
  Administered 2018-11-23: 30 mg via INTRAVENOUS
  Administered 2018-11-23 (×2): 60 mg via INTRAVENOUS
  Administered 2018-11-23: 40 mg via INTRAVENOUS

## 2018-11-23 MED ORDER — DEXMEDETOMIDINE HCL IN NACL 400 MCG/100ML IV SOLN: 0.0000 ug/kg/h | INTRAVENOUS | Status: DC

## 2018-11-23 MED ORDER — FENTANYL CITRATE (PF) 250 MCG/5ML IJ SOLN
INTRAMUSCULAR | Status: AC
  Filled 2018-11-23: qty 5

## 2018-11-23 MED ORDER — SODIUM CHLORIDE 0.9 % IV SOLN
1.5000 g | Freq: Two times a day (BID) | INTRAVENOUS | Status: AC
  Administered 2018-11-23 – 2018-11-25 (×4): 1.5 g via INTRAVENOUS
  Filled 2018-11-23 (×4): qty 1.5

## 2018-11-23 MED ORDER — SUCCINYLCHOLINE CHLORIDE 200 MG/10ML IV SOSY
PREFILLED_SYRINGE | INTRAVENOUS | Status: AC
  Filled 2018-11-23: qty 10

## 2018-11-23 MED ORDER — ARTIFICIAL TEARS OPHTHALMIC OINT
TOPICAL_OINTMENT | OPHTHALMIC | Status: DC | PRN
  Administered 2018-11-23: 1 via OPHTHALMIC

## 2018-11-23 MED ORDER — SODIUM CHLORIDE 0.9 % IV SOLN
INTRAVENOUS | Status: DC | PRN
  Administered 2018-11-23: .6 [IU]/h via INTRAVENOUS

## 2018-11-23 MED ORDER — SODIUM CHLORIDE 0.9 % IV SOLN
INTRAVENOUS | Status: DC | PRN
  Administered 2018-11-23: 13:00:00 via INTRAVENOUS

## 2018-11-23 MED ORDER — FAMOTIDINE IN NACL 20-0.9 MG/50ML-% IV SOLN
20.0000 mg | Freq: Two times a day (BID) | INTRAVENOUS | Status: DC
  Administered 2018-11-23: 20 mg via INTRAVENOUS

## 2018-11-23 MED ORDER — CHLORHEXIDINE GLUCONATE CLOTH 2 % EX PADS: 6.0000 | MEDICATED_PAD | Freq: Every day | CUTANEOUS | Status: DC

## 2018-11-23 MED ORDER — PROPOFOL 10 MG/ML IV BOLUS
INTRAVENOUS | Status: DC | PRN
  Administered 2018-11-23: 90 mg via INTRAVENOUS

## 2018-11-23 MED ORDER — METOPROLOL TARTRATE 25 MG/10 ML ORAL SUSPENSION: 12.5000 mg | Freq: Two times a day (BID) | ORAL | Status: DC

## 2018-11-23 MED ORDER — MIDAZOLAM HCL 5 MG/5ML IJ SOLN
INTRAMUSCULAR | Status: DC | PRN
  Administered 2018-11-23 (×2): 2 mg via INTRAVENOUS
  Administered 2018-11-23: 3 mg via INTRAVENOUS
  Administered 2018-11-23: 2 mg via INTRAVENOUS

## 2018-11-23 MED ORDER — INSULIN ASPART 100 UNIT/ML ~~LOC~~ SOLN
0.0000 [IU] | SUBCUTANEOUS | Status: DC
  Administered 2018-11-23 – 2018-11-24 (×2): 2 [IU] via SUBCUTANEOUS

## 2018-11-23 MED ORDER — SODIUM CHLORIDE 0.9% FLUSH: 10.0000 mL | INTRAVENOUS | Status: DC | PRN

## 2018-11-23 MED ORDER — FENTANYL CITRATE (PF) 100 MCG/2ML IJ SOLN
INTRAMUSCULAR | Status: DC | PRN
  Administered 2018-11-23 (×2): 50 ug via INTRAVENOUS
  Administered 2018-11-23: 150 ug via INTRAVENOUS
  Administered 2018-11-23 (×3): 100 ug via INTRAVENOUS
  Administered 2018-11-23 (×5): 50 ug via INTRAVENOUS
  Administered 2018-11-23: 100 ug via INTRAVENOUS

## 2018-11-23 MED ORDER — METOPROLOL TARTRATE 12.5 MG HALF TABLET
12.5000 mg | ORAL_TABLET | Freq: Two times a day (BID) | ORAL | Status: DC
  Administered 2018-11-24 – 2018-11-25 (×2): 12.5 mg via ORAL
  Filled 2018-11-23 (×2): qty 1

## 2018-11-23 MED ORDER — LACTATED RINGERS IV SOLN: 500.0000 mL | Freq: Once | INTRAVENOUS | Status: DC | PRN

## 2018-11-23 MED ORDER — ORAL CARE MOUTH RINSE
15.0000 mL | Freq: Two times a day (BID) | OROMUCOSAL | Status: DC
  Administered 2018-11-23 – 2018-11-27 (×8): 15 mL via OROMUCOSAL

## 2018-11-23 MED ORDER — INSULIN REGULAR(HUMAN) IN NACL 100-0.9 UT/100ML-% IV SOLN: INTRAVENOUS | Status: DC

## 2018-11-23 MED ORDER — ONDANSETRON HCL 4 MG/2ML IJ SOLN
4.0000 mg | Freq: Four times a day (QID) | INTRAMUSCULAR | Status: DC | PRN
  Administered 2018-11-23 – 2018-11-24 (×2): 4 mg via INTRAVENOUS
  Filled 2018-11-23 (×2): qty 2

## 2018-11-23 MED ORDER — EPHEDRINE SULFATE 50 MG/ML IJ SOLN
INTRAMUSCULAR | Status: DC | PRN
  Administered 2018-11-23 (×3): 5 mg via INTRAVENOUS

## 2018-11-23 MED ORDER — CHLORHEXIDINE GLUCONATE CLOTH 2 % EX PADS
6.0000 | MEDICATED_PAD | Freq: Every day | CUTANEOUS | Status: DC
  Administered 2018-11-23 – 2018-11-27 (×5): 6 via TOPICAL

## 2018-11-23 MED ORDER — TRAMADOL HCL 50 MG PO TABS: 50.0000 mg | ORAL_TABLET | ORAL | Status: DC | PRN

## 2018-11-23 MED ORDER — SODIUM CHLORIDE 0.9% FLUSH
10.0000 mL | Freq: Two times a day (BID) | INTRAVENOUS | Status: DC
  Administered 2018-11-23: 22:00:00 10 mL
  Administered 2018-11-24: 20 mL
  Administered 2018-11-25 – 2018-11-27 (×2): 10 mL

## 2018-11-23 MED ORDER — SODIUM CHLORIDE 0.9 % IV SOLN: 250.0000 mL | INTRAVENOUS | Status: DC

## 2018-11-23 MED ORDER — HEPARIN SODIUM (PORCINE) 1000 UNIT/ML IJ SOLN
INTRAMUSCULAR | Status: DC | PRN
  Administered 2018-11-23: 25000 [IU] via INTRAVENOUS

## 2018-11-23 MED ORDER — VANCOMYCIN HCL IN DEXTROSE 1-5 GM/200ML-% IV SOLN
1000.0000 mg | Freq: Once | INTRAVENOUS | Status: AC
  Administered 2018-11-23: 1000 mg via INTRAVENOUS
  Filled 2018-11-23: qty 200

## 2018-11-23 MED ORDER — PHENYLEPHRINE HCL-NACL 20-0.9 MG/250ML-% IV SOLN: 0.0000 ug/min | INTRAVENOUS | Status: DC

## 2018-11-23 MED ORDER — MORPHINE SULFATE (PF) 2 MG/ML IV SOLN
1.0000 mg | INTRAVENOUS | Status: DC | PRN
  Administered 2018-11-23 – 2018-11-24 (×3): 2 mg via INTRAVENOUS
  Administered 2018-11-24: 4 mg via INTRAVENOUS
  Filled 2018-11-23 (×3): qty 1
  Filled 2018-11-23: qty 2
  Filled 2018-11-23: qty 1

## 2018-11-23 MED ORDER — CALCIUM CHLORIDE 10 % IV SOLN
INTRAVENOUS | Status: DC | PRN
  Administered 2018-11-23 (×4): 100 mg via INTRAVENOUS

## 2018-11-23 MED ORDER — FENTANYL CITRATE (PF) 250 MCG/5ML IJ SOLN
INTRAMUSCULAR | Status: AC
  Filled 2018-11-23: qty 20

## 2018-11-23 MED ORDER — DOPAMINE-DEXTROSE 3.2-5 MG/ML-% IV SOLN: 1.5000 ug/kg/min | INTRAVENOUS | Status: AC

## 2018-11-23 MED ORDER — SODIUM CHLORIDE (PF) 0.9 % IJ SOLN
OROMUCOSAL | Status: DC | PRN
  Administered 2018-11-23 (×3): 4 mL via TOPICAL

## 2018-11-23 MED ORDER — 0.9 % SODIUM CHLORIDE (POUR BTL) OPTIME
TOPICAL | Status: DC | PRN
  Administered 2018-11-23: 5000 mL

## 2018-11-23 MED ORDER — ASPIRIN 81 MG PO CHEW: 324.0000 mg | CHEWABLE_TABLET | Freq: Every day | ORAL | Status: DC

## 2018-11-23 MED ORDER — SODIUM CHLORIDE 0.9% FLUSH: 3.0000 mL | INTRAVENOUS | Status: DC | PRN

## 2018-11-23 MED ORDER — PROPOFOL 10 MG/ML IV BOLUS
INTRAVENOUS | Status: AC
  Filled 2018-11-23: qty 20

## 2018-11-23 MED ORDER — CHLORHEXIDINE GLUCONATE 0.12 % MT SOLN
15.0000 mL | OROMUCOSAL | Status: AC
  Administered 2018-11-23: 15 mL via OROMUCOSAL

## 2018-11-23 MED ORDER — METOPROLOL TARTRATE 5 MG/5ML IV SOLN: 2.5000 mg | INTRAVENOUS | Status: DC | PRN

## 2018-11-23 MED ORDER — POTASSIUM CHLORIDE 10 MEQ/50ML IV SOLN: 10.0000 meq | INTRAVENOUS | Status: AC

## 2018-11-23 MED ORDER — BISACODYL 5 MG PO TBEC
10.0000 mg | DELAYED_RELEASE_TABLET | Freq: Every day | ORAL | Status: DC
  Administered 2018-11-24 – 2018-11-25 (×2): 10 mg via ORAL
  Filled 2018-11-23 (×2): qty 2

## 2018-11-23 MED ORDER — SODIUM CHLORIDE 0.9% FLUSH
3.0000 mL | Freq: Two times a day (BID) | INTRAVENOUS | Status: DC
  Administered 2018-11-24 – 2018-11-25 (×3): 3 mL via INTRAVENOUS

## 2018-11-23 MED ORDER — ACETAMINOPHEN 160 MG/5ML PO SOLN: 650.0000 mg | Freq: Once | ORAL | Status: AC

## 2018-11-23 SURGICAL SUPPLY — 81 items
ADH SKN CLS APL DERMABOND .7 (GAUZE/BANDAGES/DRESSINGS) ×4
AGENT HMST KT MTR STRL THRMB (HEMOSTASIS) ×2
APL SWBSTK 6 STRL LF DISP (MISCELLANEOUS) ×2
APPLICATOR COTTON TIP 6 STRL (MISCELLANEOUS) IMPLANT
APPLICATOR COTTON TIP 6IN STRL (MISCELLANEOUS) ×3
BAG DECANTER FOR FLEXI CONT (MISCELLANEOUS) ×3 IMPLANT
BLADE CLIPPER SURG (BLADE) ×3 IMPLANT
BLADE STERNUM SYSTEM 6 (BLADE) ×3 IMPLANT
BNDG ELASTIC 4X5.8 VLCR STR LF (GAUZE/BANDAGES/DRESSINGS) ×4 IMPLANT
BNDG ELASTIC 6X5.8 VLCR STR LF (GAUZE/BANDAGES/DRESSINGS) ×4 IMPLANT
BNDG GAUZE ELAST 4 BULKY (GAUZE/BANDAGES/DRESSINGS) ×4 IMPLANT
CANISTER SUCT 3000ML PPV (MISCELLANEOUS) ×3 IMPLANT
CANNULA VEN 2 STAGE (MISCELLANEOUS) ×1 IMPLANT
CATH CPB KIT GERHARDT (MISCELLANEOUS) ×3 IMPLANT
CATH THORACIC 28FR (CATHETERS) ×3 IMPLANT
DERMABOND ADVANCED (GAUZE/BANDAGES/DRESSINGS) ×2
DERMABOND ADVANCED .7 DNX12 (GAUZE/BANDAGES/DRESSINGS) IMPLANT
DRAIN CHANNEL 28F RND 3/8 FF (WOUND CARE) ×3 IMPLANT
DRAPE CARDIOVASCULAR INCISE (DRAPES) ×3
DRAPE SLUSH/WARMER DISC (DRAPES) ×3 IMPLANT
DRAPE SRG 135X102X78XABS (DRAPES) ×2 IMPLANT
DRSG AQUACEL AG ADV 3.5X14 (GAUZE/BANDAGES/DRESSINGS) ×3 IMPLANT
ELECT BLADE 4.0 EZ CLEAN MEGAD (MISCELLANEOUS) ×3
ELECT REM PT RETURN 9FT ADLT (ELECTROSURGICAL) ×6
ELECTRODE BLDE 4.0 EZ CLN MEGD (MISCELLANEOUS) ×2 IMPLANT
ELECTRODE REM PT RTRN 9FT ADLT (ELECTROSURGICAL) ×4 IMPLANT
FELT TEFLON 1X6 (MISCELLANEOUS) ×6 IMPLANT
GAUZE SPONGE 4X4 12PLY STRL (GAUZE/BANDAGES/DRESSINGS) ×7 IMPLANT
GLOVE BIO SURGEON STRL SZ 6 (GLOVE) ×3 IMPLANT
GLOVE BIO SURGEON STRL SZ 6.5 (GLOVE) ×13 IMPLANT
GLOVE BIOGEL PI IND STRL 6.5 (GLOVE) IMPLANT
GLOVE BIOGEL PI IND STRL 7.0 (GLOVE) IMPLANT
GLOVE BIOGEL PI IND STRL 7.5 (GLOVE) IMPLANT
GLOVE BIOGEL PI INDICATOR 6.5 (GLOVE) ×3
GLOVE BIOGEL PI INDICATOR 7.0 (GLOVE) ×2
GLOVE BIOGEL PI INDICATOR 7.5 (GLOVE) ×3
GOWN STRL REUS W/ TWL LRG LVL3 (GOWN DISPOSABLE) ×8 IMPLANT
GOWN STRL REUS W/TWL LRG LVL3 (GOWN DISPOSABLE) ×36
HEMOSTAT POWDER SURGIFOAM 1G (HEMOSTASIS) ×9 IMPLANT
HEMOSTAT SURGICEL 2X14 (HEMOSTASIS) ×3 IMPLANT
KIT BASIN OR (CUSTOM PROCEDURE TRAY) ×3 IMPLANT
KIT CATH SUCT 8FR (CATHETERS) ×3 IMPLANT
KIT SUCTION CATH 14FR (SUCTIONS) ×6 IMPLANT
KIT TURNOVER KIT B (KITS) ×3 IMPLANT
KIT VASOVIEW HEMOPRO 2 VH 4000 (KITS) ×3 IMPLANT
LEAD PACING MYOCARDI (MISCELLANEOUS) ×3 IMPLANT
MARKER GRAFT CORONARY BYPASS (MISCELLANEOUS) ×9 IMPLANT
NS IRRIG 1000ML POUR BTL (IV SOLUTION) ×15 IMPLANT
PACK E OPEN HEART (SUTURE) ×3 IMPLANT
PACK OPEN HEART (CUSTOM PROCEDURE TRAY) ×3 IMPLANT
PAD ARMBOARD 7.5X6 YLW CONV (MISCELLANEOUS) ×6 IMPLANT
PAD ELECT DEFIB RADIOL ZOLL (MISCELLANEOUS) ×3 IMPLANT
PENCIL BUTTON HOLSTER BLD 10FT (ELECTRODE) ×3 IMPLANT
POSITIONER HEAD DONUT 9IN (MISCELLANEOUS) ×3 IMPLANT
SET CARDIOPLEGIA MPS 5001102 (MISCELLANEOUS) ×1 IMPLANT
SPONGE LAP 18X18 RF (DISPOSABLE) ×3 IMPLANT
STAPLER VISISTAT 35W (STAPLE) ×1 IMPLANT
SURGIFLO W/THROMBIN 8M KIT (HEMOSTASIS) ×1 IMPLANT
SUT BONE WAX W31G (SUTURE) ×3 IMPLANT
SUT ETHILON 3 0 FSL (SUTURE) ×2 IMPLANT
SUT MNCRL AB 3-0 PS2 18 (SUTURE) ×2 IMPLANT
SUT PROLENE 3 0 SH1 36 (SUTURE) ×3 IMPLANT
SUT PROLENE 4 0 TF (SUTURE) ×6 IMPLANT
SUT PROLENE 6 0 C 1 30 (SUTURE) ×2 IMPLANT
SUT PROLENE 6 0 CC (SUTURE) ×6 IMPLANT
SUT PROLENE 7 0 BV1 MDA (SUTURE) ×4 IMPLANT
SUT PROLENE 8 0 BV175 6 (SUTURE) ×3 IMPLANT
SUT STEEL 6MS V (SUTURE) ×3 IMPLANT
SUT STEEL SZ 6 DBL 3X14 BALL (SUTURE) ×3 IMPLANT
SUT VIC AB 1 CTX 18 (SUTURE) ×6 IMPLANT
SUT VIC AB 2-0 CT1 27 (SUTURE) ×9
SUT VIC AB 2-0 CT1 TAPERPNT 27 (SUTURE) IMPLANT
SYSTEM SAHARA CHEST DRAIN ATS (WOUND CARE) ×3 IMPLANT
TAPE CLOTH SOFT 2X10 (GAUZE/BANDAGES/DRESSINGS) ×1 IMPLANT
TAPE CLOTH SURG 4X10 WHT LF (GAUZE/BANDAGES/DRESSINGS) ×3 IMPLANT
TOWEL GREEN STERILE (TOWEL DISPOSABLE) ×3 IMPLANT
TOWEL GREEN STERILE FF (TOWEL DISPOSABLE) ×3 IMPLANT
TRAY FOLEY SLVR 16FR TEMP STAT (SET/KITS/TRAYS/PACK) ×3 IMPLANT
TUBING LAP HI FLOW INSUFFLATIO (TUBING) ×3 IMPLANT
UNDERPAD 30X30 (UNDERPADS AND DIAPERS) ×3 IMPLANT
WATER STERILE IRR 1000ML POUR (IV SOLUTION) ×6 IMPLANT

## 2018-11-23 NOTE — Anesthesia Procedure Notes (Signed)
Central Venous Catheter Insertion Performed by: Duane Boston, MD, anesthesiologist Start/End10/01/2019 6:38 AM, 11/23/2018 6:48 AM Patient location: Pre-op. Preanesthetic checklist: patient identified, IV checked, site marked, risks and benefits discussed, surgical consent, monitors and equipment checked, pre-op evaluation, timeout performed and anesthesia consent Position: Trendelenburg Lidocaine 1% used for infiltration and patient sedated Hand hygiene performed , maximum sterile barriers used  and Seldinger technique used Catheter size: 8.5 Fr Total catheter length 8. PA cath was placed.Sheath introducer Swan type:thermodilution PA Cath depth:50 Procedure performed using ultrasound guided technique. Ultrasound Notes:anatomy identified, needle tip was noted to be adjacent to the nerve/plexus identified, no ultrasound evidence of intravascular and/or intraneural injection and image(s) printed for medical record Attempts: 1 Following insertion, line sutured and dressing applied. Post procedure assessment: free fluid flow, blood return through all ports and no air  Patient tolerated the procedure well with no immediate complications.

## 2018-11-23 NOTE — Progress Notes (Signed)
Echocardiogram Echocardiogram Transesophageal has been performed.  Oneal Deputy Alma Muegge 11/23/2018, 8:15 AM

## 2018-11-23 NOTE — Progress Notes (Signed)
EVENING ROUNDS NOTE :     [address]       [place] [address]             [phone]                 Day of Surgery Procedure(s) (LRB): CORONARY ARTERY BYPASS GRAFTING (CABG), ON PUMP, TIMES FOUR, LIMA to LAD, SEQ SVG to PDA, PLVB, SVG to CIRCUMFLEX, USING LEFT INTERNAL MAMMARY ARTERY AND RIGHT GREATER SAPHENOUS VEIN HARVESTED ENDOSCOPICALLY AND LEFT GREATER SAPHENOUS VEIN (N/A) TRANSESOPHAGEAL ECHOCARDIOGRAM (TEE) (N/A)  Total Length of Stay:  LOS: 5 days  BP 104/68   Pulse 89   Temp 99.3 F (37.4 C)   Resp (!) 21   Ht 5\' 10"  (1.778 m)   Wt 70.9 kg Comment: scale c  SpO2 100%   BMI 22.41 kg/m   .Intake/Output      10/12 0701 - 10/13 0700   P.O.    I.V. (mL/kg) 2211.4 (31.2)   Blood 527   IV Piggyback 2178.2   Total Intake(mL/kg) 4916.6 (69.3)   Urine (mL/kg/hr) 1320 (1.2)   Blood 950   Chest Tube 250   Total Output 2520   Net +2396.6         . sodium chloride 20 mL/hr at 11/23/18 2200  . [START ON 11/24/2018] sodium chloride    . sodium chloride 20 mL/hr at 11/23/18 1402  . cefUROXime (ZINACEF)  IV Stopped (11/23/18 1613)  . dexmedetomidine (PRECEDEX) IV infusion Stopped (11/23/18 1604)  . DOPamine 3 mcg/kg/min (11/23/18 2200)  . lactated ringers    . lactated ringers    . lactated ringers 20 mL/hr at 11/23/18 2200  . nitroGLYCERIN Stopped (11/23/18 1428)  . phenylephrine (NEO-SYNEPHRINE) Adult infusion 20 mcg/min (11/23/18 2200)     Lab Results  Component Value Date   WBC 13.9 (H) 11/23/2018   HGB 8.8 (L) 11/23/2018   HGB 8.5 (L) 11/23/2018   HCT 25.4 (L) 11/23/2018   HCT 25.0 (L) 11/23/2018   PLT 149 (L) 11/23/2018   GLUCOSE 133 (H) 11/23/2018   CHOL 145 11/20/2018   TRIG 18 11/20/2018   HDL 72 11/20/2018   LDLCALC 69 11/20/2018   ALT 16 11/20/2018   AST 16 11/20/2018   NA 134 (L) 11/23/2018   NA 135 11/23/2018   K 4.3 11/23/2018   K 4.4 11/23/2018   CL 105 11/23/2018   CREATININE 1.37 (H) 11/23/2018   BUN 19 11/23/2018    CO2 19 (L) 11/23/2018   TSH 3.22 04/16/2017   PSA 1.99 04/16/2017   INR 1.6 (H) 11/23/2018   HGBA1C 5.4 11/22/2018    Extubated uneventfully Sinus Bradycardia under pacer--- on Neo and Dopamine with good U/O CT output is low, Hgb stable at 8.8 Pain controlled, denies N/V  Dalaya Suppa, PA-C    11/23/2018 10:14 PM

## 2018-11-23 NOTE — Anesthesia Procedure Notes (Signed)
Arterial Line Insertion Start/End10/01/2019 7:11 AM Performed by: Larene Beach, CRNA, CRNA  Patient location: Pre-op. Preanesthetic checklist: patient identified, IV checked, site marked, risks and benefits discussed, surgical consent, monitors and equipment checked, pre-op evaluation, timeout performed and anesthesia consent Lidocaine 1% used for infiltration Left, radial was placed Catheter size: 20 Fr Hand hygiene performed  and maximum sterile barriers used   Attempts: 1 Procedure performed without using ultrasound guided technique. Following insertion, dressing applied and Biopatch. Post procedure assessment: normal and unchanged  Patient tolerated the procedure well with no immediate complications.

## 2018-11-23 NOTE — Procedures (Signed)
Extubation Procedure Note  Patient Details:   Name: Jeff Graves DOB: 09-28-1945 MRN: [id number]   Airway Documentation:    Vent end date: 11/23/18 Vent end time: 1808   Evaluation  O2 sats: stable throughout Complications: No apparent complications Patient did tolerate procedure well. Bilateral Breath Sounds: Clear, Diminished   Yes   NIF - 28, VC 740 mL, positive cuff leak.  Patient placed on [place] 4 L with humidity, no stridor noted, patient able to reach 500 mL using the incentive spirometer.  [name] 11/23/2018, 6:22 PM

## 2018-11-23 NOTE — Transfer of Care (Signed)
Immediate Anesthesia Transfer of Care Note  Patient: Jeff Graves  Procedure(s) Performed: CORONARY ARTERY BYPASS GRAFTING (CABG), ON PUMP, TIMES FOUR, LIMA to LAD, SEQ SVG to PDA, PLVB, SVG to CIRCUMFLEX, USING LEFT INTERNAL MAMMARY ARTERY AND RIGHT GREATER SAPHENOUS VEIN HARVESTED ENDOSCOPICALLY AND LEFT GREATER SAPHENOUS VEIN (N/A Chest) TRANSESOPHAGEAL ECHOCARDIOGRAM (TEE) (N/A )  Patient Location: ICU  Anesthesia Type:General  Level of Consciousness: sedated and Patient remains intubated per anesthesia plan  Airway & Oxygen Therapy: Patient remains intubated per anesthesia plan and Patient placed on Ventilator (see vital sign flow sheet for setting)  Post-op Assessment: Report given to RN and Post -op Vital signs reviewed and stable  Post vital signs: Reviewed  Last Vitals:  Vitals Value Taken Time  BP    Temp    Pulse    Resp    SpO2      Last Pain:  Vitals:   11/23/18 0529  TempSrc: Oral  PainSc:       Patients Stated Pain Goal: 5 ([id number])  Complications: No apparent anesthesia complications   Patient transported to ICU with standard monitors (HR, BP, SPO2, RR) and emergency drugs/equipment. Controlled ventilation maintained via ambu bag. Report given to bedside RN and respiratory therapist. Pt connected to ICU monitor and ventilator. All questions answered and vital signs stable before leaving

## 2018-11-23 NOTE — Progress Notes (Signed)
      [address]       [place] [address]             [phone]    Pre Procedure note for inpatients:   Jeff Graves has been scheduled for Procedure(s): CORONARY ARTERY BYPASS GRAFTING (CABG) (N/A) TRANSESOPHAGEAL ECHOCARDIOGRAM (TEE) (N/A) today. The various methods of treatment have been discussed with the patient. After consideration of the risks, benefits and treatment options the patient has consented to the planned procedure.   The patient has been seen and labs reviewed. There are no changes in the patient's condition to prevent proceeding with the planned procedure today.  Recent labs:  Lab Results  Component Value Date   WBC 6.6 11/21/2018   HGB 12.2 (L) 11/21/2018   HCT 34.4 (L) 11/21/2018   PLT 243 11/21/2018   GLUCOSE 85 11/22/2018   CHOL 145 11/20/2018   TRIG 18 11/20/2018   HDL 72 11/20/2018   LDLCALC 69 11/20/2018   ALT 16 11/20/2018   AST 16 11/20/2018   NA 134 (L) 11/22/2018   K 4.3 11/22/2018   CL 101 11/22/2018   CREATININE 1.36 (H) 11/22/2018   BUN 20 11/22/2018   CO2 25 11/22/2018   TSH 3.22 04/16/2017   PSA 1.99 04/16/2017   INR 1.1 11/22/2018   HGBA1C 5.4 11/22/2018   Chronic Kidney Disease   Stage I     GFR >90  Stage II    GFR 60-89  Stage IIIA GFR 45-59  Stage IIIB GFR 30-44  Stage IV   GFR 15-29  Stage V    GFR  <15  Lab Results  Component Value Date   CREATININE 1.36 (H) 11/22/2018   Estimated Creatinine Clearance: 50 mL/min (A) (by C-G formula based on SCr of 1.36 mg/dL (H)). Preop stage IIIA chronic kidney disease   [name], MD 11/23/2018 6:55 AM

## 2018-11-23 NOTE — Progress Notes (Signed)
Lopressor held for HR less than 60

## 2018-11-23 NOTE — Brief Op Note (Addendum)
      [address]       [place] [address]             [phone]    11/23/2018  1:53 PM  PATIENT:  Jeff Graves  73 y.o. male  PRE-OPERATIVE DIAGNOSIS:  Coronary Artery Disease  POST-OPERATIVE DIAGNOSIS:  Coronary Artery Disease  PROCEDURE:  Procedure(s):  CORONARY ARTERY BYPASS GRAFTING x 4 -LIMA to LAD -SEQ SVG to PDA, PLVB -SVG to CIRCUMFLEX  ENDOSCOPIC HARVEST GREATER SAPHENOUS VEIN -Right Leg  OPEN GREATER SAPHENOUS VEIN HARVEST -Left lower leg  TRANSESOPHAGEAL ECHOCARDIOGRAM (TEE) (N/A)  SURGEON:  Surgeon(s) and Role:    * [name], MD - Primary  PHYSICIAN ASSISTANT: Ellwood Handler PA-C  ANESTHESIA:   general  EBL:  950 mL   BLOOD ADMINISTERED:CELLSAVER  DRAINS: Left Pleural chest tube, mediastinal chest drains   LOCAL MEDICATIONS USED:  NONE  SPECIMEN:  No Specimen  DISPOSITION OF SPECIMEN:  N/A  COUNTS:  YES   DICTATION: .Dragon Dictation  PLAN OF CARE: Admit to inpatient   PATIENT DISPOSITION:  ICU - intubated and hemodynamically stable.   Delay start of Pharmacological VTE agent (>24hrs) due to surgical blood loss or risk of bleeding: yes

## 2018-11-23 NOTE — Anesthesia Procedure Notes (Signed)
Procedure Name: Intubation Date/Time: 11/23/2018 8:02 AM Performed by: Larene Beach, CRNA Pre-anesthesia Checklist: Patient identified, Emergency Drugs available, Suction available and Patient being monitored Patient Re-evaluated:Patient Re-evaluated prior to induction Oxygen Delivery Method: Circle system utilized Preoxygenation: Pre-oxygenation with 100% oxygen Induction Type: IV induction Ventilation: Mask ventilation without difficulty Laryngoscope Size: Mac and 4 Grade View: Grade I Tube type: Oral Tube size: 7.5 mm Number of attempts: 1 Airway Equipment and Method: Stylet Placement Confirmation: ETT inserted through vocal cords under direct vision,  positive ETCO2 and breath sounds checked- equal and bilateral Secured at: 23 cm Tube secured with: Tape Dental Injury: Teeth and Oropharynx as per pre-operative assessment

## 2018-11-24 ENCOUNTER — Inpatient Hospital Stay (HOSPITAL_COMMUNITY): Payer: PPO

## 2018-11-24 ENCOUNTER — Ambulatory Visit: Payer: PPO | Admitting: Family Medicine

## 2018-11-24 ENCOUNTER — Encounter (HOSPITAL_COMMUNITY): Payer: Self-pay | Admitting: Cardiothoracic Surgery

## 2018-11-24 DIAGNOSIS — Z951 Presence of aortocoronary bypass graft: Secondary | ICD-10-CM

## 2018-11-24 LAB — CBC
HCT: 25.7 % — ABNORMAL LOW (ref 39.0–52.0)
HCT: 24.5 % — ABNORMAL LOW (ref 39.0–52.0)
Hemoglobin: 8.8 g/dL — ABNORMAL LOW (ref 13.0–17.0)
Hemoglobin: 8.6 g/dL — ABNORMAL LOW (ref 13.0–17.0)
MCH: 33.1 pg (ref 26.0–34.0)
MCH: 33.7 pg (ref 26.0–34.0)
MCHC: 34.2 g/dL (ref 30.0–36.0)
MCHC: 35.1 g/dL (ref 30.0–36.0)
MCV: 96.6 fL (ref 80.0–100.0)
MCV: 96.1 fL (ref 80.0–100.0)
Platelets: 150 10*3/uL (ref 150–400)
Platelets: 149 10*3/uL — ABNORMAL LOW (ref 150–400)
RBC: 2.66 MIL/uL — ABNORMAL LOW (ref 4.22–5.81)
RBC: 2.55 MIL/uL — ABNORMAL LOW (ref 4.22–5.81)
RDW: 13.1 % (ref 11.5–15.5)
RDW: 13.2 % (ref 11.5–15.5)
WBC: 13.5 10*3/uL — ABNORMAL HIGH (ref 4.0–10.5)
WBC: 15.1 10*3/uL — ABNORMAL HIGH (ref 4.0–10.5)
nRBC: 0 % (ref 0.0–0.2)
nRBC: 0 % (ref 0.0–0.2)

## 2018-11-24 LAB — GLUCOSE, CAPILLARY
Glucose-Capillary: 107 mg/dL — ABNORMAL HIGH (ref 70–99)
Glucose-Capillary: 110 mg/dL — ABNORMAL HIGH (ref 70–99)
Glucose-Capillary: 112 mg/dL — ABNORMAL HIGH (ref 70–99)
Glucose-Capillary: 121 mg/dL — ABNORMAL HIGH (ref 70–99)
Glucose-Capillary: 131 mg/dL — ABNORMAL HIGH (ref 70–99)
Glucose-Capillary: 141 mg/dL — ABNORMAL HIGH (ref 70–99)
Glucose-Capillary: 153 mg/dL — ABNORMAL HIGH (ref 70–99)

## 2018-11-24 LAB — POCT I-STAT 7, (LYTES, BLD GAS, ICA,H+H)
Acid-base deficit: 1 mmol/L (ref 0.0–2.0)
Bicarbonate: 23.2 mmol/L (ref 20.0–28.0)
Calcium, Ion: 1.23 mmol/L (ref 1.15–1.40)
HCT: 27 % — ABNORMAL LOW (ref 39.0–52.0)
Hemoglobin: 9.2 g/dL — ABNORMAL LOW (ref 13.0–17.0)
O2 Saturation: 99 %
Patient temperature: 36.2
Potassium: 4.3 mmol/L (ref 3.5–5.1)
Sodium: 135 mmol/L (ref 135–145)
TCO2: 24 mmol/L (ref 22–32)
pCO2 arterial: 36.4 mmHg (ref 32.0–48.0)
pH, Arterial: 7.408 (ref 7.350–7.450)
pO2, Arterial: 155 mmHg — ABNORMAL HIGH (ref 83.0–108.0)

## 2018-11-24 LAB — BASIC METABOLIC PANEL
Anion gap: 8 (ref 5–15)
Anion gap: 8 (ref 5–15)
BUN: 23 mg/dL (ref 8–23)
BUN: 30 mg/dL — ABNORMAL HIGH (ref 8–23)
CO2: 20 mmol/L — ABNORMAL LOW (ref 22–32)
CO2: 21 mmol/L — ABNORMAL LOW (ref 22–32)
Calcium: 8.3 mg/dL — ABNORMAL LOW (ref 8.9–10.3)
Calcium: 8.3 mg/dL — ABNORMAL LOW (ref 8.9–10.3)
Chloride: 106 mmol/L (ref 98–111)
Chloride: 105 mmol/L (ref 98–111)
Creatinine, Ser: 1.26 mg/dL — ABNORMAL HIGH (ref 0.61–1.24)
Creatinine, Ser: 1.59 mg/dL — ABNORMAL HIGH (ref 0.61–1.24)
GFR calc Af Amer: >60 mL/min (ref >60)
GFR calc Af Amer: 50 mL/min — ABNORMAL LOW (ref >60)
GFR calc non Af Amer: 57 mL/min — ABNORMAL LOW (ref >60)
GFR calc non Af Amer: 43 mL/min — ABNORMAL LOW (ref >60)
Glucose, Bld: 141 mg/dL — ABNORMAL HIGH (ref 70–99)
Glucose, Bld: 117 mg/dL — ABNORMAL HIGH (ref 70–99)
Potassium: 4.6 mmol/L (ref 3.5–5.1)
Potassium: 4.5 mmol/L (ref 3.5–5.1)
Sodium: 134 mmol/L — ABNORMAL LOW (ref 135–145)
Sodium: 134 mmol/L — ABNORMAL LOW (ref 135–145)

## 2018-11-24 LAB — MAGNESIUM
Magnesium: 2.9 mg/dL — ABNORMAL HIGH (ref 1.7–2.4)
Magnesium: 3 mg/dL — ABNORMAL HIGH (ref 1.7–2.4)

## 2018-11-24 MED ORDER — ENOXAPARIN SODIUM 30 MG/0.3ML ~~LOC~~ SOLN
30.0000 mg | Freq: Every day | SUBCUTANEOUS | Status: DC
  Administered 2018-11-24 – 2018-11-27 (×4): 30 mg via SUBCUTANEOUS
  Filled 2018-11-24 (×4): qty 0.3

## 2018-11-24 MED ORDER — INSULIN ASPART 100 UNIT/ML ~~LOC~~ SOLN
0.0000 [IU] | SUBCUTANEOUS | Status: DC
  Administered 2018-11-24 – 2018-11-25 (×4): 2 [IU] via SUBCUTANEOUS

## 2018-11-24 MED FILL — Heparin Sodium (Porcine) Inj 1000 Unit/ML: INTRAMUSCULAR | Qty: 10 | Status: AC

## 2018-11-24 MED FILL — Sodium Chloride IV Soln 0.9%: INTRAVENOUS | Qty: 3000 | Status: AC

## 2018-11-24 MED FILL — Mannitol IV Soln 20%: INTRAVENOUS | Qty: 500 | Status: AC

## 2018-11-24 MED FILL — Heparin Sodium (Porcine) Inj 1000 Unit/ML: INTRAMUSCULAR | Qty: 30 | Status: AC

## 2018-11-24 MED FILL — Electrolyte-R (PH 7.4) Solution: INTRAVENOUS | Qty: 4000 | Status: AC

## 2018-11-24 MED FILL — Potassium Chloride Inj 2 mEq/ML: INTRAVENOUS | Qty: 40 | Status: AC

## 2018-11-24 MED FILL — Magnesium Sulfate Inj 50%: INTRAMUSCULAR | Qty: 10 | Status: AC

## 2018-11-24 MED FILL — Sodium Bicarbonate IV Soln 8.4%: INTRAVENOUS | Qty: 50 | Status: AC

## 2018-11-24 MED FILL — Lidocaine HCl Local Soln Prefilled Syringe 100 MG/5ML (2%): INTRAMUSCULAR | Qty: 5 | Status: AC

## 2018-11-24 NOTE — Discharge Instructions (Signed)

## 2018-11-24 NOTE — Progress Notes (Signed)
EVENING ROUNDS NOTE :     [address]       [place] [address]             [phone]                 1 Day Post-Op Procedure(s) (LRB): CORONARY ARTERY BYPASS GRAFTING (CABG), ON PUMP, TIMES FOUR, LIMA to LAD, SEQ SVG to PDA, PLVB, SVG to CIRCUMFLEX, USING LEFT INTERNAL MAMMARY ARTERY AND RIGHT GREATER SAPHENOUS VEIN HARVESTED ENDOSCOPICALLY AND LEFT GREATER SAPHENOUS VEIN (N/A) TRANSESOPHAGEAL ECHOCARDIOGRAM (TEE) (N/A)   Total Length of Stay:  LOS: 6 days  Events:  Doing well up to chair Good uop    BP 109/62   Pulse 94   Temp 99.5 F (37.5 C)   Resp (!) 28   Ht 5\' 10"  (1.778 m)   Wt 75.9 kg   SpO2 91%   BMI 24.01 kg/m   PAP: (27-46)/(8-21) 46/21 CO:  [6.2 L/min-7.6 L/min] 6.2 L/min CI:  [3.3 L/min/m2-4 L/min/m2] 3.3 L/min/m2    . sodium chloride 10 mL/hr at 11/24/18 1200  . sodium chloride    . sodium chloride 20 mL/hr at 11/23/18 1402  . cefUROXime (ZINACEF)  IV 1.5 g (11/24/18 1609)  . dexmedetomidine (PRECEDEX) IV infusion Stopped (11/23/18 1604)  . DOPamine 1.5 mcg/kg/min (11/24/18 1339)  . lactated ringers    . lactated ringers    . lactated ringers Stopped (11/24/18 1056)  . nitroGLYCERIN Stopped (11/23/18 1428)  . phenylephrine (NEO-SYNEPHRINE) Adult infusion Stopped (11/24/18 0535)    I/O last 3 completed shifts: In: 5736.3 [P.O.:240; I.V.:2691.2; Blood:527; IV Piggyback:2278.1] Out: 3116 [Urine:1646; Blood:950; Chest Tube:520]   CBC Latest Ref Rng & Units 11/24/2018 11/24/2018 11/23/2018  WBC 4.0 - 10.5 K/uL 15.1(H) 13.5(H) 13.9(H)  Hemoglobin 13.0 - 17.0 g/dL 8.6(L) 8.8(L) 8.8(L)  Hematocrit 39.0 - 52.0 % 24.5(L) 25.7(L) 25.4(L)  Platelets 150 - 400 K/uL 149(L) 150 149(L)    BMP Latest Ref Rng & Units 11/24/2018 11/24/2018 11/23/2018  Glucose 70 - 99 mg/dL 117(H) 141(H) 133(H)  BUN 8 - 23 mg/dL 30(H) 23 19  Creatinine 0.61 - 1.24 mg/dL 1.59(H) 1.26(H) 1.37(H)  BUN/Creat Ratio 10 - 24 - - -  Sodium 135 - 145 mmol/L  134(L) 134(L) 134(L)  Potassium 3.5 - 5.1 mmol/L 4.5 4.6 4.3  Chloride 98 - 111 mmol/L 105 106 105  CO2 22 - 32 mmol/L 21(L) 20(L) 19(L)  Calcium 8.9 - 10.3 mg/dL 8.3(L) 8.3(L) 8.0(L)    ABG    Component Value Date/Time   PHART 7.324 (L) 11/23/2018 1904   PCO2ART 41.4 11/23/2018 1904   PO2ART 92.0 11/23/2018 1904   HCO3 21.5 11/23/2018 1904   TCO2 23 11/23/2018 1904   ACIDBASEDEF 4.0 (H) 11/23/2018 1904   O2SAT 97.0 11/23/2018 1904       Melodie Bouillon, MD 11/24/2018 4:44 PM

## 2018-11-24 NOTE — Anesthesia Postprocedure Evaluation (Signed)
Anesthesia Post Note  Patient: Jeff Graves  Procedure(s) Performed: CORONARY ARTERY BYPASS GRAFTING (CABG), ON PUMP, TIMES FOUR, LIMA to LAD, SEQ SVG to PDA, PLVB, SVG to CIRCUMFLEX, USING LEFT INTERNAL MAMMARY ARTERY AND RIGHT GREATER SAPHENOUS VEIN HARVESTED ENDOSCOPICALLY AND LEFT GREATER SAPHENOUS VEIN (N/A Chest) TRANSESOPHAGEAL ECHOCARDIOGRAM (TEE) (N/A )     Patient location during evaluation: SICU Anesthesia Type: General Level of consciousness: awake and alert Pain management: pain level controlled Vital Signs Assessment: post-procedure vital signs reviewed and stable Respiratory status: spontaneous breathing Cardiovascular status: stable Postop Assessment: no apparent nausea or vomiting Anesthetic complications: no Comments: Comfortable and doing well.     Last Vitals:  Vitals:   11/24/18 1048 11/24/18 1100  BP: (!) 144/58   Pulse: 90 90  Resp:  (!) 30  Temp:    SpO2:  93%    Last Pain:  Vitals:   11/24/18 0800  TempSrc:   PainSc: 0-No pain                 Effie Berkshire

## 2018-11-24 NOTE — Discharge Summary (Signed)
Physician Discharge Summary  Patient ID: Jeff Graves MRN: [id number] DOB/AGE: 10/17/1945 73 y.o.  Admit date: 11/18/2018 Discharge date: 11/28/2018  Admission Diagnoses:  Patient Active Problem List   Diagnosis Date Noted  . CKD (chronic kidney disease), stage III 11/20/2018  . Coronary artery disease involving native coronary artery of native heart with unstable angina pectoris ([place]) 11/18/2018  . Coronary artery disease involving native coronary artery of native heart with angina pectoris ([place])   . Acute bronchitis 11/27/2017  . BPH with obstruction/lower urinary tract symptoms 04/16/2017  . S/P rotator cuff surgery 04/15/2014  . Diverticulosis of colon without hemorrhage 01/22/2013  . Cervical disc disorder with radiculopathy of cervical region 01/22/2013  . Postural dizziness 08/06/2012  . Atrial tachycardia 06/04/2012  . HYPERCHOLESTEROLEMIA 12/26/2006  . Essential hypertension 12/26/2006  . RENAL CALCULUS, HX OF 12/26/2006   Discharge Diagnoses:   Patient Active Problem List   Diagnosis Date Noted  . S/P CABG x 4 11/24/2018  . CKD (chronic kidney disease), stage III 11/20/2018  . Coronary artery disease involving native coronary artery of native heart with unstable angina pectoris ([place]) 11/18/2018  . Coronary artery disease involving native coronary artery of native heart with angina pectoris ([place])   . Acute bronchitis 11/27/2017  . BPH with obstruction/lower urinary tract symptoms 04/16/2017  . S/P rotator cuff surgery 04/15/2014  . Diverticulosis of colon without hemorrhage 01/22/2013  . Cervical disc disorder with radiculopathy of cervical region 01/22/2013  . Postural dizziness 08/06/2012  . Atrial tachycardia 06/04/2012  . HYPERCHOLESTEROLEMIA 12/26/2006  . Essential hypertension 12/26/2006  . RENAL CALCULUS, HX OF 12/26/2006   Discharged Condition: good  History of Present Illness:   Jeff Graves is a 73 yo white male with PMH with significant  hyperlipidemia, essential HTN, Atrial Tachycardia, diverticulosis of the colon without hemorrhage, cervical disc disorder, and BPH with obstruction.  The patient has been experiencing chest pain for about a year.  Episodes would develop when patient was exerting himself at the gym, and would resolve with rest.  He presented to Dr. Caryl Comes for a routine follow up for his Atrial Tachycardia in September.  He admitted to experiencing chest discomfort that had become more severe and the episodes were no longer resolving quickly.  He was taken off Flecainide and referred to Dr. Angelena Form for cardiac catheterization.  This was performed on 11/18/2018 and showed multivessel CAD.  It was felt coronary bypass grafting would be indicated and TCTS consult was requested.    Hospital Course:   He remained chest pain free during hospitalization.  He was evaluated by Dr. Servando Snare who was in agreement the patient would benefit from coronary artery bypass grafting.  The risks and benefits of the procedure were explained to the patient and he was agreeable to proceed.  He was taken to the operating room on 11/23/2018.  He underwent CABG x 4 utilizing LIMA to LAD, Sequential SVG to PDA and PLVB, and SVG to left circumflex.  He underwent endoscopic harvest of greater saphenous vein from his right leg and open harvest of greater saphenous vein from his left lower leg.  He tolerated the procedure without difficulty and was taken to the SICU in stable condition.  He was extubated the evening of surgery.  During his stay in the SICU the patient was weaned off Neo-synephrine and Dopamine as able.  His chest tubes and arterial lines were removed without difficulty.  His CXR showed a right apical pneumothorax.  He underwent needle decompression, which  was tolerated without difficulty.  Follow up CXR showed improvement of right apical pneumothorax.  He developed atrial fibrillation and was treated with IV Amiodarone.  He converted to NSR and  was transitioned to oral amiodarone.  He continued to maintain NSR.  His pacing wires were removed without difficulty.  He is ambulating independently.  His incisions are healing without evidence of infection.  Will d/c home today.  Significant Diagnostic Studies: angiography:    Prox RCA to Dist RCA lesion is 40% stenosed.  Dist RCA lesion is 60% stenosed.  RPAV lesion is 95% stenosed.  Prox Cx lesion is 80% stenosed.  Ost LAD to Prox LAD lesion is 99% stenosed.  Prox LAD to Mid LAD lesion is 80% stenosed.   1. Severe triple vessel CAD with heavily calcified vessels.  2. Severe stenosis ostial and mid LAD 3. Severe stenosis proximal circumflex 4. Severe stenosis large caliber posterolateral artery.   Treatments: surgery:   SURGICAL PROCEDURE:  Coronary artery bypass grafting x4 with the left internal mammary to the left anterior descending coronary artery, reverse saphenous vein graft to the circumflex coronary artery, sequential reverse saphenous vein graft to the posterior descending and posterolateral branches of the right coronary artery with right thigh and calf endoscopic vein harvesting and open left calf greater saphenous vein harvesting.  Discharge Exam: Blood pressure 118/77, pulse 74, temperature 98.5 F (36.9 C), temperature source Oral, resp. rate (!) 25, height 5\' 10"  (1.778 m), weight 73.8 kg, SpO2 97 %.  General appearance: alert, cooperative and no distress Heart: regular rate and rhythm Lungs: clear to auscultation bilaterally Abdomen: soft, non-tender; bowel sounds normal; no masses,  no organomegaly Extremities: edema trace Wound: clean and dry, staples remain in place on LLE  Discharge Medications:  Discharge Instructions    Amb Referral to Cardiac Rehabilitation   Complete by: As directed    Diagnosis: CABG   CABG X ___: 4   After initial evaluation and assessments completed: Virtual Based Care may be provided alone or in conjunction with Phase 2  Cardiac Rehab based on patient barriers.: Yes     Allergies as of 11/28/2018   No Known Allergies     Medication List    STOP taking these medications   azithromycin 250 MG tablet Commonly known as: ZITHROMAX   losartan 50 MG tablet Commonly known as: COZAAR   ranolazine 500 MG 12 hr tablet Commonly known as: RANEXA     TAKE these medications   amiodarone 200 MG tablet Commonly known as: PACERONE Take 1 tablet (200 mg total) by mouth 2 (two) times daily. For 7 days, then decrease to 200 mg daily   aspirin 325 MG EC tablet Take 1 tablet (325 mg total) by mouth daily. What changed:   medication strength  how much to take   atorvastatin 80 MG tablet Commonly known as: LIPITOR Take 1 tablet (80 mg total) by mouth daily at 6 PM. What changed:   medication strength  how much to take  when to take this   ferrous gluconate 324 MG tablet Commonly known as: FERGON Take 1 tablet (324 mg total) by mouth 2 (two) times daily with a meal.   folic acid 1 MG tablet Commonly known as: FOLVITE Take 1 tablet (1 mg total) by mouth daily.   loratadine 10 MG tablet Commonly known as: CLARITIN Take 10 mg by mouth daily as needed for allergies.   metoprolol tartrate 25 MG tablet Commonly known as: LOPRESSOR Take 1 tablet (  25 mg total) by mouth 2 (two) times daily.   multivitamin with minerals tablet Take 1 tablet by mouth daily.   traMADol 50 MG tablet Commonly known as: ULTRAM Take 1 tablet (50 mg total) by mouth every 4 (four) hours as needed for moderate pain.   Vitamin D 50 MCG (2000 UT) Caps Take 2,000 Units by mouth daily.      Follow-up Information    [name], MD Follow up on 12/31/2018.   Specialty: Cardiothoracic Surgery Why: Appointment is at 10:30, please get CXR at 10:00 at [facility] located on first floor of our office building Contact information: [place] [address] [phone]        [facility] Follow up on 12/07/2018.   Specialty: Cardiothoracic Surgery Why: Appointment is at 11:00 wtih nurse for staple removal Contact information: [place], [place] [phone]       Tommie Raymond, NP Follow up on 12/11/2018.   Specialty: Cardiology Why: Appointment is at 10:30 Contact information: [address] [place] [address] [phone]           Signed: Ellwood Handler, PA-C  11/28/2018, 12:10 PM

## 2018-11-24 NOTE — Op Note (Signed)
NAME: Jeff Graves, Jeff Graves MEDICAL RECORD [id number] ACCOUNT [id number] DATE OF BIRTH:Dec 10, 1945 FACILITY: [facility] LOCATION: [facility] PHYSICIAN:Danen Lapaglia Maryruth Bun, MD  OPERATIVE REPORT  DATE OF PROCEDURE:  11/23/2018  PREOPERATIVE DIAGNOSIS:  Coronary occlusive disease with new onset of angina.  POSTOPERATIVE DIAGNOSIS:  Coronary occlusive disease with new onset of angina.  SURGICAL PROCEDURE:  Coronary artery bypass grafting x4 with the left internal mammary to the left anterior descending coronary artery, reverse saphenous vein graft to the circumflex coronary artery, sequential reverse saphenous vein graft to the  posterior descending and posterolateral branches of the right coronary artery with right thigh and calf endoscopic vein harvesting and open left calf greater saphenous vein harvesting.  SURGEON:  Lanelle Bal, MD  FIRST ASSISTANT:  Ellwood Handler, PA-C  BRIEF HISTORY:  The patient is a 73 year old male who presented with new onset of exertional angina that was unstable in nature, new onset and increasing in severity.  He underwent evaluation by cardiology and ultimately cardiac catheterization was  performed.  This revealed significant 3-vessel coronary artery disease with diffuse disease throughout the proximal right and a 90% proximal posterolateral branch stenosis.  The LAD was a small bifid dual system.  The circumflex was a large vessel with  80% proximal lesion.  Overall, ventricular function was preserved.  Because of the patient's symptoms and significant disease, coronary artery bypass grafting was recommended to the patient who agreed and signed informed consent.  DESCRIPTION OF PROCEDURE:  With Swan-Ganz and arterial line monitors in place, the patient underwent general endotracheal anesthesia without incident.  Skin of the chest and legs were prepped with Betadine, draped in the usual sterile manner.   Appropriate timeout was performed and we initially started with  endoscopic vein harvesting of the right greater saphenous thigh and calf pain.  After harvesting, one segment of vein was of satisfactory caliber and quality.  However, it appeared that the  vein was likely a dual system and the midportion of the vein was too small to be usable.  A small incision was made at the left calf.  Again, the vein at this site appeared small.  We made an incision at the left ankle and the vein appeared to be of  excellent quality and size and was harvested in an open technique supplying a good quality segment of vein with sufficient length to sequential to the posterior descending and posterolateral branches.  Median sternotomy was performed.  Left internal  mammary artery was dissected down as a pedicle graft.  The distal artery was divided and had good free flow.  Pericardium was opened.  Overall, ventricular function appeared preserved.  This was confirmed by TEE.  The patient was systemically  heparinized.  The ascending aorta was cannulated.  The right atrium was cannulated.  An aortic root vent cardioplegia needle was introduced into the ascending aorta.  The patient was placed on cardiopulmonary bypass 2.4 liters per minute per meter  square.  Sites of anastomosis were dissected out of the epicardium.  The posterior descending and posterior lateral branches were of good quality and size.  The LAD as noted was really a bifid system.  The most medial branch was intramyocardial and is  obtained from the surface was less than 1 mm in size.  The more lateral branch was also small, but suitable for bypass.  The circumflex was a 1.5 mm vessel.  Aortic crossclamp was applied and 500 mL of cold blood potassium cardioplegia was administered  antegrade into the root to  obtain a satisfactory septal temperature.  Additional 400 mL of cardioplegia was administered.  We then proceeded with elevating the heart and identifying the posterior descending coronary artery, which was opened and  admitted  a 1.5 mm probe using a diamond type side-to-side anastomosis.  It was carried out with a segment of reverse saphenous vein graft.  Distal extent of the same vein was then carried to the posterior lateral branch.  This vessel was opened and was also a 1.5  mm vessel and of good quality.  Using a running 7-0 Prolene, distal anastomosis was performed.  Additional cold blood cardioplegia was administered down the vein graft.  Attention was then turned to the circumflex coronary artery.  This vessel was  opened and admitted a 1.5 mm probe.  Using a running 7-0 Prolene, a distal anastomosis was performed.  We then turned our attention to the left anterior descending coronary artery.  As noted, this was really a bifid LAD system.  With the more medial  branch being a very small vessel and the more lateral branch slightly larger.  We then proceeded to open the more lateral branch and admitted a 1 mm probe distally.  Using a running 8-0 Prolene, the left internal mammary artery was anastomosed to left  anterior descending coronary artery.  With cross clamp still in place, 2 punch aortotomies were performed and each of the 2 vein grafts were anastomosed to the ascending aorta.  The bulldog was removed from the mammary artery with rise in myocardial  septal temperature.  The heart was allowed to passively fill and deair.  The proximal anastomoses were completed and the cross clamp was removed with total crossclamp time of 88 minutes.  The patient spontaneously converted to a sinus rhythm.  He  required atrial pacing to increase the rate.  Sites of anastomosis were inspected and were free of bleeding.  With atrial and ventricular pacing wires in placed, the patient was then ventilated and weaned from cardiopulmonary bypass without difficulty.   He remained hemodynamically stable.  He was decannulated in the usual fashion.  Protamine sulfate was administered with operative field hemostatic.  A left pleural tube  and a Blake mediastinal drain were left in place.  Pericardium was loosely  reapproximated.  The sternum was closed with #6 stainless steel wire.  Fascia closed with interrupted 0 Vicryl, running 3-0 Vicryl, subcutaneous tissue with 3-0 subcuticular stitch and the skin edges.  Dry dressings were applied.  Sponge and needle count  was reported as correct at completion of procedure with the exception of one missing 8-0 needle.  An x-ray was not obtained as this would not be obvious on x-ray.  The patient did not require any blood bank blood products during the operative procedure.   Total pump time was 116 minutes.  TN/NUANCE  D:11/24/2018 T:11/24/2018 JOB:[id number]

## 2018-11-24 NOTE — Progress Notes (Signed)
Patient ID: DARVELL RINDAL, male   DOB: 06/29/1945, 73 y.o.   MRN: [id number] TCTS DAILY ICU PROGRESS NOTE                   [address]            [place] [address]          [phone]   1 Day Post-Op Procedure(s) (LRB): CORONARY ARTERY BYPASS GRAFTING (CABG), ON PUMP, TIMES FOUR, LIMA to LAD, SEQ SVG to PDA, PLVB, SVG to CIRCUMFLEX, USING LEFT INTERNAL MAMMARY ARTERY AND RIGHT GREATER SAPHENOUS VEIN HARVESTED ENDOSCOPICALLY AND LEFT GREATER SAPHENOUS VEIN (N/A) TRANSESOPHAGEAL ECHOCARDIOGRAM (TEE) (N/A)  Total Length of Stay:  LOS: 6 days   Subjective: Patient awake alert neurologically intact  Objective: Vital signs in last 24 hours: Temp:  [96.3 F (35.7 C)-99.5 F (37.5 C)] 99.5 F (37.5 C) (10/13 0600) Pulse Rate:  [89-90] 89 (10/13 0600) Cardiac Rhythm: Atrial paced (10/13 0400) Resp:  [7-29] 24 (10/13 0600) BP: (89-124)/(49-71) 105/66 (10/13 0600) SpO2:  [98 %-100 %] 100 % (10/13 0600) Arterial Line BP: (80-129)/(46-59) 119/53 (10/13 0600) FiO2 (%):  [40 %-50 %] 40 % (10/12 1740) Weight:  [75.9 kg] 75.9 kg (10/13 0500)  Filed Weights   11/22/18 0123 11/23/18 0529 11/24/18 0500  Weight: 71.2 kg 70.9 kg 75.9 kg    Weight change: 5.048 kg   Hemodynamic parameters for last 24 hours: PAP: (17-34)/(4-18) 33/17 CO:  [5.9 L/min-8.4 L/min] 6.2 L/min CI:  [3.2 L/min/m2-4.5 L/min/m2] 3.3 L/min/m2  Intake/Output from previous day: 10/12 0701 - 10/13 0700 In: 5452.3 [I.V.:2647.2; Blood:527; IV Piggyback:2278.1] Out: 3115 [Urine:1645; Blood:950; Chest Tube:520]  Intake/Output this shift: No intake/output data recorded.  Current Meds: Scheduled Meds: . acetaminophen  1,000 mg Oral Q6H   Or  . acetaminophen (TYLENOL) oral liquid 160 mg/5 mL  1,000 mg Per Tube Q6H  . aspirin EC  325 mg Oral Daily   Or  . aspirin  324 mg Per Tube Daily  . atorvastatin  80 mg Oral q1800  . bisacodyl  10 mg Oral Daily   Or  . bisacodyl  10 mg Rectal Daily  .  Chlorhexidine Gluconate Cloth  6 each Topical Daily  . Chlorhexidine Gluconate Cloth  6 each Topical Daily  . docusate sodium  200 mg Oral Daily  . influenza vaccine adjuvanted  0.5 mL Intramuscular Tomorrow-1000  . insulin aspart  0-24 Units Subcutaneous Q4H  . mouth rinse  15 mL Mouth Rinse BID  . metoprolol tartrate  12.5 mg Oral BID   Or  . metoprolol tartrate  12.5 mg Per Tube BID  . [START ON 11/25/2018] pantoprazole  40 mg Oral Daily  . sodium chloride flush  10-40 mL Intracatheter Q12H  . sodium chloride flush  3 mL Intravenous Q12H   Continuous Infusions: . sodium chloride 20 mL/hr at 11/24/18 0600  . sodium chloride    . sodium chloride 20 mL/hr at 11/23/18 1402  . cefUROXime (ZINACEF)  IV Stopped (11/24/18 0546)  . dexmedetomidine (PRECEDEX) IV infusion Stopped (11/23/18 1604)  . DOPamine 3 mcg/kg/min (11/24/18 0600)  . lactated ringers    . lactated ringers    . lactated ringers 20 mL/hr at 11/24/18 0600  . nitroGLYCERIN Stopped (11/23/18 1428)  . phenylephrine (NEO-SYNEPHRINE) Adult infusion Stopped (11/24/18 0535)   PRN Meds:.sodium chloride, lactated ringers, metoprolol tartrate, midazolam, morphine injection, ondansetron (ZOFRAN) IV, oxyCODONE, sodium chloride flush, sodium chloride flush, traMADol  General appearance: alert,  cooperative and no distress Neurologic: intact Heart: friction rub heard Left apex Lungs: diminished breath sounds bibasilar Abdomen: soft, non-tender; bowel sounds normal; no masses,  no organomegaly Extremities: extremities normal, atraumatic, no cyanosis or edema and Homans sign is negative, no sign of DVT Wound: Dressing intact sternum stable  Lab Results: CBC: Recent Labs    11/23/18 1904 11/24/18 0500  WBC 13.9* 13.5*  HGB 8.8*  8.5* 8.8*  HCT 25.4*  25.0* 25.7*  PLT 149* 150   BMET:  Recent Labs    11/23/18 1904 11/24/18 0500  NA 134*  135 134*  K 4.3  4.4 4.6  CL 105 106  CO2 19* 20*  GLUCOSE 133* 141*  BUN 19  23  CREATININE 1.37* 1.26*  CALCIUM 8.0* 8.3*    CMET: Lab Results  Component Value Date   WBC 13.5 (H) 11/24/2018   HGB 8.8 (L) 11/24/2018   HCT 25.7 (L) 11/24/2018   PLT 150 11/24/2018   GLUCOSE 141 (H) 11/24/2018   CHOL 145 11/20/2018   TRIG 18 11/20/2018   HDL 72 11/20/2018   LDLCALC 69 11/20/2018   ALT 16 11/20/2018   AST 16 11/20/2018   NA 134 (L) 11/24/2018   K 4.6 11/24/2018   CL 106 11/24/2018   CREATININE 1.26 (H) 11/24/2018   BUN 23 11/24/2018   CO2 20 (L) 11/24/2018   TSH 3.22 04/16/2017   PSA 1.99 04/16/2017   INR 1.6 (H) 11/23/2018   HGBA1C 5.4 11/22/2018      PT/INR:  Recent Labs    11/23/18 1409  LABPROT 18.6*  INR 1.6*   Radiology: Dg Chest Port 1 View  Result Date: 11/23/2018 CLINICAL DATA:  CABG. EXAM: PORTABLE CHEST 1 VIEW COMPARISON:  One-view chest x-ray 11/22/2018 FINDINGS: Patient is status post median sternotomy for CABG. The patient remains intubated. Endotracheal tube terminates 4.5 cm above the carina. Swan-Ganz catheter entering via right IJ sheath terminates the main pulmonary outflow tract. Side port of the NG tube is at the GE junction. Mediastinal drain and left-sided chest tubes are in place. There is no pneumothorax. There is no edema or effusion. Lung volumes are low. IMPRESSION: 1. Status post CABG. 2. Support apparatus in satisfactory position. 3. Low lung volumes. Electronically Signed   By: San Morelle M.D.   On: 11/23/2018 14:32     Assessment/Plan: S/P Procedure(s) (LRB): CORONARY ARTERY BYPASS GRAFTING (CABG), ON PUMP, TIMES FOUR, LIMA to LAD, SEQ SVG to PDA, PLVB, SVG to CIRCUMFLEX, USING LEFT INTERNAL MAMMARY ARTERY AND RIGHT GREATER SAPHENOUS VEIN HARVESTED ENDOSCOPICALLY AND LEFT GREATER SAPHENOUS VEIN (N/A) TRANSESOPHAGEAL ECHOCARDIOGRAM (TEE) (N/A) Mobilize Diuresis d/c tubes/lines Expected Acute  Blood - loss Anemia- continue to monitor  Stage III chronic kidney disease preoperatively, renal function  remained stable urine output adequate continue to monitor and avoid Toradol    [name] 11/24/2018 7:24 AM

## 2018-11-25 ENCOUNTER — Inpatient Hospital Stay (HOSPITAL_COMMUNITY): Payer: PPO

## 2018-11-25 LAB — GLUCOSE, CAPILLARY
Glucose-Capillary: 105 mg/dL — ABNORMAL HIGH (ref 70–99)
Glucose-Capillary: 154 mg/dL — ABNORMAL HIGH (ref 70–99)
Glucose-Capillary: 127 mg/dL — ABNORMAL HIGH (ref 70–99)
Glucose-Capillary: 105 mg/dL — ABNORMAL HIGH (ref 70–99)

## 2018-11-25 LAB — CBC
HCT: 24.6 % — ABNORMAL LOW (ref 39.0–52.0)
Hemoglobin: 8.1 g/dL — ABNORMAL LOW (ref 13.0–17.0)
MCH: 32.7 pg (ref 26.0–34.0)
MCHC: 32.9 g/dL (ref 30.0–36.0)
MCV: 99.2 fL (ref 80.0–100.0)
Platelets: 144 10*3/uL — ABNORMAL LOW (ref 150–400)
RBC: 2.48 MIL/uL — ABNORMAL LOW (ref 4.22–5.81)
RDW: 13.4 % (ref 11.5–15.5)
WBC: 16.5 10*3/uL — ABNORMAL HIGH (ref 4.0–10.5)
nRBC: 0 % (ref 0.0–0.2)

## 2018-11-25 LAB — BASIC METABOLIC PANEL
Anion gap: 8 (ref 5–15)
BUN: 33 mg/dL — ABNORMAL HIGH (ref 8–23)
CO2: 23 mmol/L (ref 22–32)
Calcium: 8.3 mg/dL — ABNORMAL LOW (ref 8.9–10.3)
Chloride: 103 mmol/L (ref 98–111)
Creatinine, Ser: 1.42 mg/dL — ABNORMAL HIGH (ref 0.61–1.24)
GFR calc Af Amer: 57 mL/min — ABNORMAL LOW (ref >60)
GFR calc non Af Amer: 49 mL/min — ABNORMAL LOW (ref >60)
Glucose, Bld: 115 mg/dL — ABNORMAL HIGH (ref 70–99)
Potassium: 4.6 mmol/L (ref 3.5–5.1)
Sodium: 134 mmol/L — ABNORMAL LOW (ref 135–145)

## 2018-11-25 MED ORDER — BISACODYL 10 MG RE SUPP: 10.0000 mg | Freq: Every day | RECTAL | Status: DC | PRN

## 2018-11-25 MED ORDER — AMIODARONE IV BOLUS ONLY 150 MG/100ML: 150.0000 mg | Freq: Once | INTRAVENOUS | Status: DC

## 2018-11-25 MED ORDER — BISACODYL 5 MG PO TBEC: 10.0000 mg | DELAYED_RELEASE_TABLET | Freq: Every day | ORAL | Status: DC | PRN

## 2018-11-25 MED ORDER — MOVING RIGHT ALONG BOOK
Freq: Once | Status: DC
  Administered 2018-11-25: 17:00:00

## 2018-11-25 MED ORDER — SODIUM CHLORIDE 0.9% FLUSH
3.0000 mL | Freq: Two times a day (BID) | INTRAVENOUS | Status: DC
  Administered 2018-11-25 – 2018-11-27 (×6): 3 mL via INTRAVENOUS

## 2018-11-25 MED ORDER — ASPIRIN EC 325 MG PO TBEC
325.0000 mg | DELAYED_RELEASE_TABLET | Freq: Every day | ORAL | Status: DC
  Administered 2018-11-26 – 2018-11-28 (×3): 325 mg via ORAL
  Filled 2018-11-25 (×3): qty 1

## 2018-11-25 MED ORDER — AMIODARONE LOAD VIA INFUSION
150.0000 mg | Freq: Once | INTRAVENOUS | Status: AC
  Administered 2018-11-25: 150 mg via INTRAVENOUS
  Filled 2018-11-25: qty 83.34

## 2018-11-25 MED ORDER — METOPROLOL TARTRATE 12.5 MG HALF TABLET
12.5000 mg | ORAL_TABLET | Freq: Three times a day (TID) | ORAL | Status: DC
  Administered 2018-11-25 – 2018-11-27 (×7): 12.5 mg via ORAL
  Filled 2018-11-25 (×7): qty 1

## 2018-11-25 MED ORDER — METOPROLOL TARTRATE 12.5 MG HALF TABLET: 12.5000 mg | ORAL_TABLET | Freq: Two times a day (BID) | ORAL | Status: DC

## 2018-11-25 MED ORDER — INSULIN ASPART 100 UNIT/ML ~~LOC~~ SOLN
0.0000 [IU] | Freq: Three times a day (TID) | SUBCUTANEOUS | Status: DC
  Administered 2018-11-25: 2 [IU] via SUBCUTANEOUS

## 2018-11-25 MED ORDER — PANTOPRAZOLE SODIUM 40 MG PO TBEC
40.0000 mg | DELAYED_RELEASE_TABLET | Freq: Every day | ORAL | Status: DC
  Administered 2018-11-26 – 2018-11-28 (×3): 40 mg via ORAL
  Filled 2018-11-25 (×3): qty 1

## 2018-11-25 MED ORDER — SODIUM CHLORIDE 0.9 % IV SOLN: 250.0000 mL | INTRAVENOUS | Status: DC | PRN

## 2018-11-25 MED ORDER — ONDANSETRON HCL 4 MG/2ML IJ SOLN: 4.0000 mg | Freq: Four times a day (QID) | INTRAMUSCULAR | Status: DC | PRN

## 2018-11-25 MED ORDER — TRAMADOL HCL 50 MG PO TABS: 50.0000 mg | ORAL_TABLET | ORAL | Status: DC | PRN

## 2018-11-25 MED ORDER — SODIUM CHLORIDE 0.9% FLUSH: 3.0000 mL | INTRAVENOUS | Status: DC | PRN

## 2018-11-25 MED ORDER — FOLIC ACID 1 MG PO TABS
1.0000 mg | ORAL_TABLET | Freq: Every day | ORAL | Status: DC
  Administered 2018-11-25 – 2018-11-28 (×4): 1 mg via ORAL
  Filled 2018-11-25 (×4): qty 1

## 2018-11-25 MED ORDER — AMIODARONE HCL IN DEXTROSE 360-4.14 MG/200ML-% IV SOLN
60.0000 mg/h | INTRAVENOUS | Status: AC
  Administered 2018-11-25: 60 mg/h via INTRAVENOUS

## 2018-11-25 MED ORDER — AMIODARONE HCL IN DEXTROSE 360-4.14 MG/200ML-% IV SOLN
30.0000 mg/h | INTRAVENOUS | Status: DC
  Filled 2018-11-25: qty 200

## 2018-11-25 MED ORDER — AMIODARONE HCL 200 MG PO TABS
200.0000 mg | ORAL_TABLET | Freq: Two times a day (BID) | ORAL | Status: DC
  Administered 2018-11-25 – 2018-11-28 (×6): 200 mg via ORAL
  Filled 2018-11-25 (×7): qty 1

## 2018-11-25 MED ORDER — LIDOCAINE HCL (PF) 1 % IJ SOLN
INTRAMUSCULAR | Status: AC
  Administered 2018-11-25: 09:00:00 5 mL
  Filled 2018-11-25: qty 5

## 2018-11-25 MED ORDER — ONDANSETRON HCL 4 MG PO TABS: 4.0000 mg | ORAL_TABLET | Freq: Four times a day (QID) | ORAL | Status: DC | PRN

## 2018-11-25 NOTE — Progress Notes (Signed)
  Amiodarone Drug - Drug Interaction Consult Note  Recommendations: None, monitor for now  Amiodarone is metabolized by the cytochrome P450 system and therefore has the potential to cause many drug interactions. Amiodarone has an average plasma half-life of 50 days (range 20 to 100 days).   There is potential for drug interactions to occur several weeks or months after stopping treatment and the onset of drug interactions may be slow after initiating amiodarone.   [x]  Statins: Increased risk of myopathy. Simvastatin- restrict dose to 20mg  daily. Other statins: counsel patients to report any muscle pain or weakness immediately.  []  Anticoagulants: Amiodarone can increase anticoagulant effect. Consider warfarin dose reduction. Patients should be monitored closely and the dose of anticoagulant altered accordingly, remembering that amiodarone levels take several weeks to stabilize.  []  Antiepileptics: Amiodarone can increase plasma concentration of phenytoin, the dose should be reduced. Note that small changes in phenytoin dose can result in large changes in levels. Monitor patient and counsel on signs of toxicity.  [x]  Beta blockers: increased risk of bradycardia, AV block and myocardial depression. Sotalol - avoid concomitant use.  []   Calcium channel blockers (diltiazem and verapamil): increased risk of bradycardia, AV block and myocardial depression.  []   Cyclosporine: Amiodarone increases levels of cyclosporine. Reduced dose of cyclosporine is recommended.  []  Digoxin dose should be halved when amiodarone is started.  []  Diuretics: increased risk of cardiotoxicity if hypokalemia occurs.  []  Oral hypoglycemic agents (glyburide, glipizide, glimepiride): increased risk of hypoglycemia. Patient's glucose levels should be monitored closely when initiating amiodarone therapy.   []  Drugs that prolong the QT interval:  Torsades de pointes risk may be increased with concurrent use - avoid if  possible.  Monitor QTc, also keep magnesium/potassium WNL if concurrent therapy can't be avoided. [name] Antibiotics: e.g. fluoroquinolones, erythromycin. . Antiarrhythmics: e.g. quinidine, procainamide, disopyramide, sotalol. . Antipsychotics: e.g. phenothiazines, haloperidol.  . Lithium, tricyclic antidepressants, and methadone. Thank You,  Pat Patrick  11/25/2018 4:30 PM

## 2018-11-25 NOTE — TOC Initial Note (Signed)
Transition of Care [facility]) - Initial/Assessment Note    Patient Details  Name: Jeff Graves MRN: [id number] Date of Birth: 06-15-1945  [facility] of [facility]) CM/SW Contact:    Midge Minium RN, BSN, NCM-BC, ACM-RN [phone] Phone Number: 11/25/2018, 2:37 PM  Clinical Narrative:                 CM following for dispositional needs. Patient lives at home with spouse and is s/p CABG. Progressing well with CM team to continue to follow.    Expected Discharge Plan: Home/Self Care Barriers to Discharge: Continued Medical Work up    Expected Discharge Plan and Services Expected Discharge Plan: Home/Self Care In-house Referral: NA Discharge Planning Services: CM Consult Post Acute Care Choice: NA Living arrangements for the past 2 months: Single Family Home                      Prior Living Arrangements/Services Living arrangements for the past 2 months: Single Family Home Lives with:: Self, Spouse                   Activities of Daily Living Home Assistive Devices/Equipment: None ADL Screening (condition at time of admission) Patient's cognitive ability adequate to safely complete daily activities?: Yes Is the patient deaf or have difficulty hearing?: No Does the patient have difficulty seeing, even when wearing glasses/contacts?: No Does the patient have difficulty concentrating, remembering, or making decisions?: No Patient able to express need for assistance with ADLs?: Yes Does the patient have difficulty dressing or bathing?: No Independently performs ADLs?: Yes (appropriate for developmental age) Does the patient have difficulty walking or climbing stairs?: No Weakness of Legs: None Weakness of Arms/Hands: None   Admission diagnosis:  Coronary artery disease involving native coronary artery of native heart with unstable angina pectoris ([place]) [I25.110] Patient Active Problem List   Diagnosis Date Noted  . S/P CABG x 4 11/24/2018  . CKD (chronic kidney  disease), stage III 11/20/2018  . Coronary artery disease involving native coronary artery of native heart with unstable angina pectoris ([place]) 11/18/2018  . Coronary artery disease involving native coronary artery of native heart with angina pectoris ([place])   . Acute bronchitis 11/27/2017  . BPH with obstruction/lower urinary tract symptoms 04/16/2017  . S/P rotator cuff surgery 04/15/2014  . Diverticulosis of colon without hemorrhage 01/22/2013  . Cervical disc disorder with radiculopathy of cervical region 01/22/2013  . Postural dizziness 08/06/2012  . Atrial tachycardia 06/04/2012  . HYPERCHOLESTEROLEMIA 12/26/2006  . Essential hypertension 12/26/2006  . RENAL CALCULUS, HX OF 12/26/2006   PCP:  No primary care provider on file. Pharmacy:   [facility] - [place], [place] - [address] [place] [address] Phone: [phone] Fax: [phone]     Social Determinants of Health (SDOH) Interventions    Readmission Risk Interventions No flowsheet data found.

## 2018-11-25 NOTE — Plan of Care (Signed)

## 2018-11-25 NOTE — Progress Notes (Signed)
Patient ID: Jeff Graves, male   DOB: [birth year], 73 y.o.   MRN: [id number] TCTS DAILY ICU PROGRESS NOTE                   [address]            [place] [address]          [phone]   2 Days Post-Op Procedure(s) (LRB): CORONARY ARTERY BYPASS GRAFTING (CABG), ON PUMP, TIMES FOUR, LIMA to LAD, SEQ SVG to PDA, PLVB, SVG to CIRCUMFLEX, USING LEFT INTERNAL MAMMARY ARTERY AND RIGHT GREATER SAPHENOUS VEIN HARVESTED ENDOSCOPICALLY AND LEFT GREATER SAPHENOUS VEIN (N/A) TRANSESOPHAGEAL ECHOCARDIOGRAM (TEE) (N/A)  Total Length of Stay:  LOS: 7 days   Subjective: Patient up in chair feels well this morning, ambulating without difficulty  Objective: Vital signs in last 24 hours: Temp:  [97.7 F (36.5 C)-99.5 F (37.5 C)] 98.1 F (36.7 C) (10/14 0400) Pulse Rate:  [86-95] 91 (10/14 0500) Cardiac Rhythm: Atrial paced (10/14 0400) Resp:  [15-30] 26 (10/14 0500) BP: (91-144)/(57-70) 121/69 (10/14 0500) SpO2:  [91 %-99 %] 96 % (10/14 0500) Arterial Line BP: (115-147)/(47-59) 115/47 (10/13 1500)  Filed Weights   11/22/18 0123 11/23/18 0529 11/24/18 0500  Weight: 71.2 kg 70.9 kg 75.9 kg    Weight change:    Hemodynamic parameters for last 24 hours: PAP: (35-46)/(13-21) 46/21  Intake/Output from previous day: 10/13 0701 - 10/14 0700 In: 947.3 [P.O.:450; I.V.:385.2; IV Piggyback:112] Out: 1070 [Urine:900; Chest Tube:170]  Intake/Output this shift: No intake/output data recorded.  Current Meds: Scheduled Meds: . acetaminophen  1,000 mg Oral Q6H   Or  . acetaminophen (TYLENOL) oral liquid 160 mg/5 mL  1,000 mg Per Tube Q6H  . aspirin EC  325 mg Oral Daily   Or  . aspirin  324 mg Per Tube Daily  . atorvastatin  80 mg Oral q1800  . bisacodyl  10 mg Oral Daily   Or  . bisacodyl  10 mg Rectal Daily  . Chlorhexidine Gluconate Cloth  6 each Topical Daily  . Chlorhexidine Gluconate Cloth  6 each Topical Daily  . docusate sodium  200 mg Oral Daily  . enoxaparin  (LOVENOX) injection  30 mg Subcutaneous QHS  . influenza vaccine adjuvanted  0.5 mL Intramuscular Tomorrow-1000  . insulin aspart  0-24 Units Subcutaneous Q4H  . mouth rinse  15 mL Mouth Rinse BID  . metoprolol tartrate  12.5 mg Oral BID   Or  . metoprolol tartrate  12.5 mg Per Tube BID  . pantoprazole  40 mg Oral Daily  . sodium chloride flush  10-40 mL Intracatheter Q12H  . sodium chloride flush  3 mL Intravenous Q12H   Continuous Infusions: . sodium chloride Stopped (11/25/18 0455)  . sodium chloride    . sodium chloride 20 mL/hr at 11/23/18 1402  . dexmedetomidine (PRECEDEX) IV infusion Stopped (11/23/18 1604)  . lactated ringers    . lactated ringers    . lactated ringers Stopped (11/24/18 1056)  . nitroGLYCERIN Stopped (11/23/18 1428)  . phenylephrine (NEO-SYNEPHRINE) Adult infusion Stopped (11/24/18 0535)   PRN Meds:.sodium chloride, lactated ringers, metoprolol tartrate, midazolam, morphine injection, ondansetron (ZOFRAN) IV, oxyCODONE, sodium chloride flush, sodium chloride flush, traMADol  General appearance: alert, cooperative and no distress Neurologic: intact Heart: regular rate and rhythm, S1, S2 normal, no murmur, click, rub or gallop and a paced Lungs: diminished breath sounds RUL Abdomen: soft, non-tender; bowel sounds normal; no masses,  no organomegaly Extremities: extremities  normal, atraumatic, no cyanosis or edema and Homans sign is negative, no sign of DVT Wound: sternum stable  Lab Results: CBC: Recent Labs    11/24/18 1527 11/25/18 0500  WBC 15.1* 16.5*  HGB 8.6* 8.1*  HCT 24.5* 24.6*  PLT 149* 144*   BMET:  Recent Labs    11/24/18 1527 11/25/18 0500  NA 134* 134*  K 4.5 4.6  CL 105 103  CO2 21* 23  GLUCOSE 117* 115*  BUN 30* 33*  CREATININE 1.59* 1.42*  CALCIUM 8.3* 8.3*    CMET: Lab Results  Component Value Date   WBC 16.5 (H) 11/25/2018   HGB 8.1 (L) 11/25/2018   HCT 24.6 (L) 11/25/2018   PLT 144 (L) 11/25/2018   GLUCOSE 115  (H) 11/25/2018   CHOL 145 11/20/2018   TRIG 18 11/20/2018   HDL 72 11/20/2018   LDLCALC 69 11/20/2018   ALT 16 11/20/2018   AST 16 11/20/2018   NA 134 (L) 11/25/2018   K 4.6 11/25/2018   CL 103 11/25/2018   CREATININE 1.42 (H) 11/25/2018   BUN 33 (H) 11/25/2018   CO2 23 11/25/2018   TSH 3.22 04/16/2017   PSA 1.99 04/16/2017   INR 1.6 (H) 11/23/2018   HGBA1C 5.4 11/22/2018      PT/INR:  Recent Labs    11/23/18 1409  LABPROT 18.6*  INR 1.6*   Radiology: No results found. Chest x-ray not read this morning yet but shows moderate right apical pneumothorax about the same as yesterday  Assessment/Plan: S/P Procedure(s) (LRB): CORONARY ARTERY BYPASS GRAFTING (CABG), ON PUMP, TIMES FOUR, LIMA to LAD, SEQ SVG to PDA, PLVB, SVG to CIRCUMFLEX, USING LEFT INTERNAL MAMMARY ARTERY AND RIGHT GREATER SAPHENOUS VEIN HARVESTED ENDOSCOPICALLY AND LEFT GREATER SAPHENOUS VEIN (N/A) TRANSESOPHAGEAL ECHOCARDIOGRAM (TEE) (N/A) Mobilize Diuresis Expected Acute  Blood - loss Anemia- continue to monitor  Renal function at baseline-continue to monitor appears euvolemic at this point We will plan aspiration of right apical pneumo Possible transfer to 4 E. today    [name] 11/25/2018 7:10 AM

## 2018-11-25 NOTE — Procedures (Signed)
Aspiration right pneumothorax  After confirming the patient's identity and laterality and explaining to him the procedure the right chest was prepped sterilely 5 cc of 1% lidocaine was infiltrated over the second intercostal space A 16-gauge needle attached to waterseal was introduced into the chest, with coughing the patient evacuated the apical pneumothorax, at the completion there was no further air leak in the catheter was removed Patient tolerated without complication, follow-up portable chest x-ray will be done  [name] MD      [address] [place] [address] Office [phone]   [place]

## 2018-11-25 NOTE — Progress Notes (Signed)
Patient ID: Jeff Graves, male   DOB: 11/25/1945, 73 y.o.   MRN: [id number]      [address]       [place] [address]             [phone]                 2 Days Post-Op Procedure(s) (LRB): CORONARY ARTERY BYPASS GRAFTING (CABG), ON PUMP, TIMES FOUR, LIMA to LAD, SEQ SVG to PDA, PLVB, SVG to CIRCUMFLEX, USING LEFT INTERNAL MAMMARY ARTERY AND RIGHT GREATER SAPHENOUS VEIN HARVESTED ENDOSCOPICALLY AND LEFT GREATER SAPHENOUS VEIN (N/A) TRANSESOPHAGEAL ECHOCARDIOGRAM (TEE) (N/A)  LOS: 7 days   Subjective: Patient awake and alert , comfortable breathing  Objective: Vital signs in last 24 hours: Patient Vitals for the past 24 hrs:  BP Temp Temp src Pulse Resp SpO2  11/25/18 1000 (!) 96/54 - - 79 (!) 22 94 %  11/25/18 0900 98/62 - - 89 (!) 21 98 %  11/25/18 0800 (!) 106/55 98.2 F (36.8 C) Oral 63 20 97 %  11/25/18 0700 123/71 - - 89 20 94 %  11/25/18 0500 121/69 - - 91 (!) 26 96 %  11/25/18 0400 109/65 98.1 F (36.7 C) Oral 86 (!) 25 97 %  11/25/18 0300 97/61 - - 95 20 97 %  11/25/18 0200 105/65 - - 93 19 99 %  11/25/18 0100 92/61 - - 89 17 99 %  11/25/18 0000 (!) 91/57 97.7 F (36.5 C) Oral 89 19 97 %  11/24/18 2300 97/70 - - 88 (!) 24 93 %  11/24/18 2200 100/62 - - 91 15 98 %  11/24/18 2100 100/65 - - 89 20 99 %  11/24/18 2000 104/63 98.3 F (36.8 C) Oral 88 18 98 %  11/24/18 1900 (!) 93/59 - - 90 19 98 %  11/24/18 1800 98/65 - - 89 (!) 22 97 %  11/24/18 1700 - - - 90 (!) 22 92 %  11/24/18 1600 115/70 - - 94 (!) 28 91 %  11/24/18 1500 99/60 - - 94 (!) 25 91 %  11/24/18 1400 117/68 - - 89 (!) 27 95 %  11/24/18 1300 121/65 - - 89 (!) 23 94 %    Filed Weights   11/22/18 0123 11/23/18 0529 11/24/18 0500  Weight: 71.2 kg 70.9 kg 75.9 kg    Hemodynamic parameters for last 24 hours:    Intake/Output from previous day: 10/13 0701 - 10/14 0700 In: 947.3 [P.O.:450; I.V.:385.2; IV Piggyback:112] Out: 1070 [Urine:900; Chest Tube:170] Intake/Output  this shift: Total I/O In: 157.5 [P.O.:60; I.V.:9.4; IV Piggyback:88.1] Out: 150 [Urine:150]  Scheduled Meds: . acetaminophen  1,000 mg Oral Q6H   Or  . acetaminophen (TYLENOL) oral liquid 160 mg/5 mL  1,000 mg Per Tube Q6H  . aspirin EC  325 mg Oral Daily   Or  . aspirin  324 mg Per Tube Daily  . atorvastatin  80 mg Oral q1800  . bisacodyl  10 mg Oral Daily   Or  . bisacodyl  10 mg Rectal Daily  . Chlorhexidine Gluconate Cloth  6 each Topical Daily  . Chlorhexidine Gluconate Cloth  6 each Topical Daily  . docusate sodium  200 mg Oral Daily  . enoxaparin (LOVENOX) injection  30 mg Subcutaneous QHS  . folic acid  1 mg Oral Daily  . influenza vaccine adjuvanted  0.5 mL Intramuscular Tomorrow-1000  . insulin aspart  0-24 Units Subcutaneous Q4H  . mouth rinse  15 mL Mouth Rinse BID  . metoprolol tartrate  12.5 mg Oral BID   Or  . metoprolol tartrate  12.5 mg Per Tube BID  . pantoprazole  40 mg Oral Daily  . sodium chloride flush  10-40 mL Intracatheter Q12H  . sodium chloride flush  3 mL Intravenous Q12H   Continuous Infusions: . sodium chloride Stopped (11/25/18 [id number])  . sodium chloride    . sodium chloride 20 mL/hr at 11/23/18 1402  . dexmedetomidine (PRECEDEX) IV infusion Stopped (11/23/18 1604)  . lactated ringers    . lactated ringers    . lactated ringers Stopped (11/24/18 1056)  . nitroGLYCERIN Stopped (11/23/18 1428)  . phenylephrine (NEO-SYNEPHRINE) Adult infusion Stopped (11/24/18 0535)   PRN Meds:.sodium chloride, lactated ringers, metoprolol tartrate, midazolam, morphine injection, ondansetron (ZOFRAN) IV, oxyCODONE, sodium chloride flush, sodium chloride flush, traMADol  General appearance: alert and no distress Neurologic: intact Heart: regular rate and rhythm, S1, S2 normal, no murmur, click, rub or gallop Lungs: diminished breath sounds RUL  Lab Results: CBC: Recent Labs    11/24/18 1527 11/25/18 0500  WBC 15.1* 16.5*  HGB 8.6* 8.1*  HCT 24.5* 24.6*   PLT 149* 144*   BMET:  Recent Labs    11/24/18 1527 11/25/18 0500  NA 134* 134*  K 4.5 4.6  CL 105 103  CO2 21* 23  GLUCOSE 117* 115*  BUN 30* 33*  CREATININE 1.59* 1.42*  CALCIUM 8.3* 8.3*    PT/INR:  Recent Labs    11/23/18 1409  LABPROT 18.6*  INR 1.6*     Radiology Dg Chest Port 1 View  Result Date: 11/25/2018 CLINICAL DATA:  Status post coronary bypass graft. EXAM: PORTABLE CHEST 1 VIEW COMPARISON:  November 24, 2018. FINDINGS: Stable cardiomegaly. Status post coronary bypass graft. Mild left basilar atelectasis is noted with small left pleural effusion. Right lung is clear. Chest tubes have been removed. Bony thorax is unremarkable. IMPRESSION: Mild left basilar atelectasis is noted with small left pleural effusion. No pneumothorax is noted status post chest tube removal. Electronically Signed   By: Marijo Conception M.D.   On: 11/25/2018 07:34   Dg Chest Port 1 View  Result Date: 11/24/2018 CLINICAL DATA:  Chest tube.  Sore chest EXAM: PORTABLE CHEST 1 VIEW COMPARISON:  Yesterday FINDINGS: Tracheal and esophageal extubation. Swan-Ganz catheter from the right with tip at the right main pulmonary artery. Chest tubes are present. Low volume chest with increased atelectasis. Trace left apical pneumothorax. Small to moderate right apical pneumothorax. New gaseous distention of the stomach. New gas distended colon interposed on the liver. IMPRESSION: 1. Extubation with lower volumes and increased atelectasis. 2. Bilateral pneumothorax that is trace on the left and small to moderate on the right. 3. Notable interval gaseous distention of the stomach. There is also gas distended colon over the right upper quadrant. Electronically Signed   By: Monte Fantasia M.D.   On: 11/24/2018 08:28   Dg Chest Port 1 View  Result Date: 11/23/2018 CLINICAL DATA:  CABG. EXAM: PORTABLE CHEST 1 VIEW COMPARISON:  One-view chest x-ray 11/22/2018 FINDINGS: Patient is status post median sternotomy for  CABG. The patient remains intubated. Endotracheal tube terminates 4.5 cm above the carina. Swan-Ganz catheter entering via right IJ sheath terminates the main pulmonary outflow tract. Side port of the NG tube is at the GE junction. Mediastinal drain and left-sided chest tubes are in place. There is no pneumothorax. There is no edema or effusion. Lung volumes are low. IMPRESSION:  1. Status post CABG. 2. Support apparatus in satisfactory position. 3. Low lung volumes. Electronically Signed   By: San Morelle M.D.   On: 11/23/2018 14:32     Assessment/Plan: S/P Procedure(s) (LRB): CORONARY ARTERY BYPASS GRAFTING (CABG), ON PUMP, TIMES FOUR, LIMA to LAD, SEQ SVG to PDA, PLVB, SVG to CIRCUMFLEX, USING LEFT INTERNAL MAMMARY ARTERY AND RIGHT GREATER SAPHENOUS VEIN HARVESTED ENDOSCOPICALLY AND LEFT GREATER SAPHENOUS VEIN (N/A) TRANSESOPHAGEAL ECHOCARDIOGRAM (TEE) (N/A) Mobilize   Follow chest xray shows decreased right ptx , not ptx on left Sinus with pacs with pacer off Plan to transfer to 4e  [name] MD 11/25/2018 12:04 PM

## 2018-11-25 NOTE — Progress Notes (Signed)
TCTS Evening Rounds POD #2 s/p CABG. Had right PTX aspirated today; back in AF Resting comfortably; no complaints Continue amiodarone gtt. Makar Slatter Z. Orvan Seen, [place]

## 2018-11-26 ENCOUNTER — Inpatient Hospital Stay (HOSPITAL_COMMUNITY): Payer: PPO

## 2018-11-26 LAB — CBC
HCT: 24.2 % — ABNORMAL LOW (ref 39.0–52.0)
Hemoglobin: 8.2 g/dL — ABNORMAL LOW (ref 13.0–17.0)
MCH: 32.8 pg (ref 26.0–34.0)
MCHC: 33.9 g/dL (ref 30.0–36.0)
MCV: 96.8 fL (ref 80.0–100.0)
Platelets: 174 10*3/uL (ref 150–400)
RBC: 2.5 MIL/uL — ABNORMAL LOW (ref 4.22–5.81)
RDW: 13.5 % (ref 11.5–15.5)
WBC: 13.5 10*3/uL — ABNORMAL HIGH (ref 4.0–10.5)
nRBC: 0 % (ref 0.0–0.2)

## 2018-11-26 LAB — GLUCOSE, CAPILLARY
Glucose-Capillary: 101 mg/dL — ABNORMAL HIGH (ref 70–99)
Glucose-Capillary: 104 mg/dL — ABNORMAL HIGH (ref 70–99)
Glucose-Capillary: 105 mg/dL — ABNORMAL HIGH (ref 70–99)
Glucose-Capillary: 113 mg/dL — ABNORMAL HIGH (ref 70–99)
Glucose-Capillary: 131 mg/dL — ABNORMAL HIGH (ref 70–99)
Glucose-Capillary: 96 mg/dL (ref 70–99)

## 2018-11-26 LAB — TYPE AND SCREEN
ABO/RH(D): O POS
Antibody Screen: NEGATIVE
Unit division: 0
Unit division: 0
Unit division: 0
Unit division: 0

## 2018-11-26 LAB — BASIC METABOLIC PANEL
Anion gap: 8 (ref 5–15)
BUN: 38 mg/dL — ABNORMAL HIGH (ref 8–23)
CO2: 22 mmol/L (ref 22–32)
Calcium: 8.3 mg/dL — ABNORMAL LOW (ref 8.9–10.3)
Chloride: 102 mmol/L (ref 98–111)
Creatinine, Ser: 1.45 mg/dL — ABNORMAL HIGH (ref 0.61–1.24)
GFR calc Af Amer: 56 mL/min — ABNORMAL LOW (ref >60)
GFR calc non Af Amer: 48 mL/min — ABNORMAL LOW (ref >60)
Glucose, Bld: 146 mg/dL — ABNORMAL HIGH (ref 70–99)
Potassium: 4.6 mmol/L (ref 3.5–5.1)
Sodium: 132 mmol/L — ABNORMAL LOW (ref 135–145)

## 2018-11-26 LAB — BPAM RBC
Blood Product Expiration Date: 202011122359
Blood Product Expiration Date: 202011122359
Blood Product Expiration Date: 202011162359
Blood Product Expiration Date: 202011162359
ISSUE DATE / TIME: 202010120722
ISSUE DATE / TIME: 202010120722
Unit Type and Rh: 5100
Unit Type and Rh: 5100
Unit Type and Rh: 5100
Unit Type and Rh: 5100

## 2018-11-26 MED ORDER — POTASSIUM CHLORIDE CRYS ER 20 MEQ PO TBCR
20.0000 meq | EXTENDED_RELEASE_TABLET | Freq: Every day | ORAL | Status: DC
  Administered 2018-11-26 – 2018-11-28 (×3): 20 meq via ORAL
  Filled 2018-11-26 (×3): qty 1

## 2018-11-26 MED ORDER — FUROSEMIDE 10 MG/ML IJ SOLN
40.0000 mg | Freq: Once | INTRAMUSCULAR | Status: AC
  Administered 2018-11-26: 40 mg via INTRAVENOUS
  Filled 2018-11-26: qty 4

## 2018-11-26 NOTE — Progress Notes (Signed)
Patient ID: Jeff Graves, male   DOB: 1945-12-26, 73 y.o.   MRN: [id number] EVENING ROUNDS NOTE :     [address]       [place] [address]             [phone]                 3 Days Post-Op Procedure(s) (LRB): CORONARY ARTERY BYPASS GRAFTING (CABG), ON PUMP, TIMES FOUR, LIMA to LAD, SEQ SVG to PDA, PLVB, SVG to CIRCUMFLEX, USING LEFT INTERNAL MAMMARY ARTERY AND RIGHT GREATER SAPHENOUS VEIN HARVESTED ENDOSCOPICALLY AND LEFT GREATER SAPHENOUS VEIN (N/A) TRANSESOPHAGEAL ECHOCARDIOGRAM (TEE) (N/A)  Total Length of Stay:  LOS: 8 days  BP 110/76 (BP Location: Left Arm)   Pulse 90   Temp 98.2 F (36.8 C) (Oral)   Resp 18   Ht 5\' 10"  (1.778 m)   Wt 75.6 kg   SpO2 99%   BMI 23.91 kg/m   .Intake/Output      10/15 0701 - 10/16 0700   P.O. 240   I.V. (mL/kg)    IV Piggyback    Total Intake(mL/kg) 240 (3.2)   Urine (mL/kg/hr) 1300 (1.4)   Emesis/NG output    Stool    Total Output 1300   Net -1060         . sodium chloride    . amiodarone Stopped (11/25/18 1630)     Lab Results  Component Value Date   WBC 13.5 (H) 11/26/2018   HGB 8.2 (L) 11/26/2018   HCT 24.2 (L) 11/26/2018   PLT 174 11/26/2018   GLUCOSE 146 (H) 11/26/2018   CHOL 145 11/20/2018   TRIG 18 11/20/2018   HDL 72 11/20/2018   LDLCALC 69 11/20/2018   ALT 16 11/20/2018   AST 16 11/20/2018   NA 132 (L) 11/26/2018   K 4.6 11/26/2018   CL 102 11/26/2018   CREATININE 1.45 (H) 11/26/2018   BUN 38 (H) 11/26/2018   CO2 22 11/26/2018   TSH 3.22 04/16/2017   PSA 1.99 04/16/2017   INR 1.6 (H) 11/23/2018   HGBA1C 5.4 11/22/2018   Stable day waiting for step down bed   [name] MD  Beeper [phone] Office [phone] 11/26/2018 7:30 PM

## 2018-11-26 NOTE — Progress Notes (Addendum)
[facility] DAILY ICU PROGRESS NOTE                   [address]            [place] [address]          [phone]   3 Days Post-Op Procedure(s) (LRB): CORONARY ARTERY BYPASS GRAFTING (CABG), ON PUMP, TIMES FOUR, LIMA to LAD, SEQ SVG to PDA, PLVB, SVG to CIRCUMFLEX, USING LEFT INTERNAL MAMMARY ARTERY AND RIGHT GREATER SAPHENOUS VEIN HARVESTED ENDOSCOPICALLY AND LEFT GREATER SAPHENOUS VEIN (N/A) TRANSESOPHAGEAL ECHOCARDIOGRAM (TEE) (N/A)  Total Length of Stay:  LOS: 8 days   Subjective:  Up in chair no specific complaints.  Gets pain when he moves around.  + ambulation  + BM Objective: Vital signs in last 24 hours: Temp:  [97.9 F (36.6 C)-99.6 F (37.6 C)] 99.4 F (37.4 C) (10/15 0400) Pulse Rate:  [29-128] 77 (10/15 0500) Cardiac Rhythm: Normal sinus rhythm (10/15 0400) Resp:  [19-38] 21 (10/15 0500) BP: (91-123)/(53-79) 109/69 (10/15 0500) SpO2:  [89 %-100 %] 100 % (10/15 0500) FiO2 (%):  [40 %] 40 % (10/14 0800) Weight:  [75.6 kg] 75.6 kg (10/15 0559)  Filed Weights   11/23/18 0529 11/24/18 0500 11/26/18 0559  Weight: 70.9 kg 75.9 kg 75.6 kg    Weight change:    Intake/Output from previous day: 10/14 0701 - 10/15 0700 In: 729.8 [P.O.:540; I.V.:101.7; IV Piggyback:88.1] Out: 1000 [Urine:1000]   Current Meds: Scheduled Meds: . amiodarone  200 mg Oral BID  . aspirin EC  325 mg Oral Daily  . atorvastatin  80 mg Oral q1800  . Chlorhexidine Gluconate Cloth  6 each Topical Daily  . Chlorhexidine Gluconate Cloth  6 each Topical Daily  . enoxaparin (LOVENOX) injection  30 mg Subcutaneous QHS  . folic acid  1 mg Oral Daily  . furosemide  40 mg Intravenous Once  . influenza vaccine adjuvanted  0.5 mL Intramuscular Tomorrow-1000  . mouth rinse  15 mL Mouth Rinse BID  . metoprolol tartrate  12.5 mg Oral TID  . pantoprazole  40 mg Oral QAC breakfast  . potassium chloride  20 mEq Oral Daily  . sodium chloride flush  10-40 mL Intracatheter Q12H  . sodium  chloride flush  3 mL Intravenous Q12H   Continuous Infusions: . sodium chloride    . amiodarone Stopped (11/25/18 2242)  . amiodarone Stopped (11/25/18 1630)   PRN Meds:.sodium chloride, bisacodyl **OR** bisacodyl, ondansetron **OR** ondansetron (ZOFRAN) IV, sodium chloride flush, sodium chloride flush, traMADol  General appearance: alert, cooperative and no distress Heart: regular rate and rhythm Lungs: clear to auscultation bilaterally Abdomen: soft, non-tender; bowel sounds normal; no masses,  no organomegaly Extremities: edema 1-2+ pitting Wound: clean and dry, staples in place on LLE  Lab Results: CBC: Recent Labs    11/24/18 1527 11/25/18 0500  WBC 15.1* 16.5*  HGB 8.6* 8.1*  HCT 24.5* 24.6*  PLT 149* 144*   BMET:  Recent Labs    11/24/18 1527 11/25/18 0500  NA 134* 134*  K 4.5 4.6  CL 105 103  CO2 21* 23  GLUCOSE 117* 115*  BUN 30* 33*  CREATININE 1.59* 1.42*  CALCIUM 8.3* 8.3*    CMET: Lab Results  Component Value Date   WBC 16.5 (H) 11/25/2018   HGB 8.1 (L) 11/25/2018   HCT 24.6 (L) 11/25/2018   PLT 144 (L) 11/25/2018   GLUCOSE 115 (H) 11/25/2018   CHOL 145 11/20/2018   TRIG  18 11/20/2018   HDL 72 11/20/2018   LDLCALC 69 11/20/2018   ALT 16 11/20/2018   AST 16 11/20/2018   NA 134 (L) 11/25/2018   K 4.6 11/25/2018   CL 103 11/25/2018   CREATININE 1.42 (H) 11/25/2018   BUN 33 (H) 11/25/2018   CO2 23 11/25/2018   TSH 3.22 04/16/2017   PSA 1.99 04/16/2017   INR 1.6 (H) 11/23/2018   HGBA1C 5.4 11/22/2018      PT/INR:  Recent Labs    11/23/18 1409  LABPROT 18.6*  INR 1.6*   Radiology: Dg Chest 2 View  Result Date: 11/26/2018 CLINICAL DATA:  Status post cardiac surgery. EXAM: CHEST - 2 VIEW COMPARISON:  November 25, 2018. FINDINGS: Stable cardiomediastinal silhouette. Mild to moderate right pneumothorax is noted which is slightly improved compared to prior exam. Stable left basilar atelectasis or infiltrate is noted with small pleural  effusion. Minimal right basilar atelectasis is noted. Bony thorax is unremarkable. IMPRESSION: Mild to moderate right pneumothorax is noted which is slightly improved compared to prior exam. Stable bibasilar atelectasis is noted, left greater than right. Electronically Signed   By: Marijo Conception M.D.   On: 11/26/2018 07:35   Dg Chest Port 1 View  Result Date: 11/25/2018 CLINICAL DATA:  Right pneumothorax EXAM: PORTABLE CHEST 1 VIEW COMPARISON:  11/25/2018 FINDINGS: Large right pneumothorax is again noted, stable since prior study. Cardiomegaly. Prior CABG. Vascular congestion. Left basilar atelectasis. IMPRESSION: Continued large right pneumothorax, approximately 40%. Cardiomegaly, vascular congestion. Left base atelectasis. Electronically Signed   By: Rolm Baptise M.D.   On: 11/25/2018 12:25     Assessment/Plan: S/P Procedure(s) (LRB): CORONARY ARTERY BYPASS GRAFTING (CABG), ON PUMP, TIMES FOUR, LIMA to LAD, SEQ SVG to PDA, PLVB, SVG to CIRCUMFLEX, USING LEFT INTERNAL MAMMARY ARTERY AND RIGHT GREATER SAPHENOUS VEIN HARVESTED ENDOSCOPICALLY AND LEFT GREATER SAPHENOUS VEIN (N/A) TRANSESOPHAGEAL ECHOCARDIOGRAM (TEE) (N/A)  1. CV- PAF overnight, currently in NSR- will continue Lopressor, stop Amiodarone drip, continue oral tablets 2. Pulm- right pneumothorax, decrease in size since needle decompression yesterday, continue to monitor, repeat CXR in AM 3. Renal- H/o CKD, AM value pending, + pitting edema on exam, will give 40 mg IV lasix today, supplement K 4. Expected post operative blood loss anemia, stable 5. CBGs controlled- not a diabetic, will d/c SSIP 6. Dispo- patient stable, PAF overnight, in NSR this morning on Amiodarone, diurese today, repeat CXR in AM, awaiting transfer to [place] 11/26/2018 7:54 AM   In sinus now , on po amiodrone Resume transfer to 4e  I have seen and examined Ardine Eng and agree with the above assessment  and plan.  [name] MD  Beeper [phone] Office [phone] 11/26/2018 8:23 AM

## 2018-11-26 NOTE — Progress Notes (Signed)
Chaplain provided initial visit with Jeff Graves and his wife.  Chaplain explained spiritual care support services offered.  Jeff Graves noted that today he was feeling a lot better than the previous day.  Will continue to follow-up.

## 2018-11-26 NOTE — Progress Notes (Signed)
CARDIAC REHAB PHASE I   PRE:  Rate/Rhythm: 93 SR  BP:  Supine: 98/79  Sitting:   Standing:    SaO2: 98%RA  MODE:  Ambulation: 870 ft   POST:  Rate/Rhythm: 108 ST  BP:  Supine:   Sitting: 140/77  Standing:    SaO2: 97%RA 1415-1448 Pt walked 870 ft with EVA and minimal asst. Gait steady. Will try rolling walker tomorrow. Pt tolerated well. To recliner with call bell.   Graylon Good, RN BSN  11/26/2018 2:45 PM

## 2018-11-27 ENCOUNTER — Inpatient Hospital Stay (HOSPITAL_COMMUNITY): Payer: PPO

## 2018-11-27 LAB — BASIC METABOLIC PANEL
Anion gap: 7 (ref 5–15)
BUN: 36 mg/dL — ABNORMAL HIGH (ref 8–23)
CO2: 22 mmol/L (ref 22–32)
Calcium: 8 mg/dL — ABNORMAL LOW (ref 8.9–10.3)
Chloride: 104 mmol/L (ref 98–111)
Creatinine, Ser: 1.35 mg/dL — ABNORMAL HIGH (ref 0.61–1.24)
GFR calc Af Amer: >60 mL/min (ref >60)
GFR calc non Af Amer: 52 mL/min — ABNORMAL LOW (ref >60)
Glucose, Bld: 99 mg/dL (ref 70–99)
Potassium: 4.2 mmol/L (ref 3.5–5.1)
Sodium: 133 mmol/L — ABNORMAL LOW (ref 135–145)

## 2018-11-27 LAB — CBC
HCT: 21.4 % — ABNORMAL LOW (ref 39.0–52.0)
Hemoglobin: 7.8 g/dL — ABNORMAL LOW (ref 13.0–17.0)
MCH: 34.1 pg — ABNORMAL HIGH (ref 26.0–34.0)
MCHC: 36.4 g/dL — ABNORMAL HIGH (ref 30.0–36.0)
MCV: 93.4 fL (ref 80.0–100.0)
Platelets: 197 10*3/uL (ref 150–400)
RBC: 2.29 MIL/uL — ABNORMAL LOW (ref 4.22–5.81)
RDW: 13.3 % (ref 11.5–15.5)
WBC: 9.2 10*3/uL (ref 4.0–10.5)
nRBC: 0 % (ref 0.0–0.2)

## 2018-11-27 LAB — GLUCOSE, CAPILLARY: Glucose-Capillary: 103 mg/dL — ABNORMAL HIGH (ref 70–99)

## 2018-11-27 MED ORDER — FERROUS GLUCONATE 324 (38 FE) MG PO TABS
324.0000 mg | ORAL_TABLET | Freq: Two times a day (BID) | ORAL | Status: DC
  Administered 2018-11-27 (×2): 324 mg via ORAL
  Filled 2018-11-27 (×4): qty 1

## 2018-11-27 NOTE — Progress Notes (Signed)
Patient ID: Jeff Graves, male   DOB: [birth year], 73 y.o.   MRN: [id number] [facility] DAILY ICU PROGRESS NOTE                   [address]            [place] [address]          [phone]   4 Days Post-Op Procedure(s) (LRB): CORONARY ARTERY BYPASS GRAFTING (CABG), ON PUMP, TIMES FOUR, LIMA to LAD, SEQ SVG to PDA, PLVB, SVG to CIRCUMFLEX, USING LEFT INTERNAL MAMMARY ARTERY AND RIGHT GREATER SAPHENOUS VEIN HARVESTED ENDOSCOPICALLY AND LEFT GREATER SAPHENOUS VEIN (N/A) TRANSESOPHAGEAL ECHOCARDIOGRAM (TEE) (N/A)  Total Length of Stay:  LOS: 9 days   Subjective: Patient up in chair waiting for stepdown bed, is remained in sinus for the past 24 hours  Objective: Vital signs in last 24 hours: Temp:  [97.9 F (36.6 C)-99.9 F (37.7 C)] 98.5 F (36.9 C) (10/16 0400) Pulse Rate:  [74-154] 93 (10/16 0600) Cardiac Rhythm: Normal sinus rhythm (10/16 0600) Resp:  [18-28] 18 (10/15 1949) BP: (94-148)/(65-81) 128/73 (10/16 0600) SpO2:  [92 %-100 %] 94 % (10/16 0600) Weight:  [73.3 kg] 73.3 kg (10/16 0600)  Filed Weights   11/24/18 0500 11/26/18 0559 11/27/18 0600  Weight: 75.9 kg 75.6 kg 73.3 kg    Weight change: -2.3 kg   Hemodynamic parameters for last 24 hours:    Intake/Output from previous day: 10/15 0701 - 10/16 0700 In: 480 [P.O.:480] Out: 2100 [Urine:2100]  Intake/Output this shift: No intake/output data recorded.  Current Meds: Scheduled Meds: . amiodarone  200 mg Oral BID  . aspirin EC  325 mg Oral Daily  . atorvastatin  80 mg Oral q1800  . Chlorhexidine Gluconate Cloth  6 each Topical Daily  . Chlorhexidine Gluconate Cloth  6 each Topical Daily  . enoxaparin (LOVENOX) injection  30 mg Subcutaneous QHS  . folic acid  1 mg Oral Daily  . influenza vaccine adjuvanted  0.5 mL Intramuscular Tomorrow-1000  . mouth rinse  15 mL Mouth Rinse BID  . metoprolol tartrate  12.5 mg Oral TID  . pantoprazole  40 mg Oral QAC breakfast  . potassium chloride  20  mEq Oral Daily  . sodium chloride flush  10-40 mL Intracatheter Q12H  . sodium chloride flush  3 mL Intravenous Q12H   Continuous Infusions: . sodium chloride    . amiodarone Stopped (11/25/18 1630)   PRN Meds:.sodium chloride, bisacodyl **OR** bisacodyl, ondansetron **OR** ondansetron (ZOFRAN) IV, sodium chloride flush, sodium chloride flush, traMADol  General appearance: alert and no distress Neurologic: intact Heart: regular rate and rhythm, S1, S2 normal, no murmur, click, rub or gallop Lungs: clear to auscultation bilaterally Abdomen: soft, non-tender; bowel sounds normal; no masses,  no organomegaly Extremities: extremities normal, atraumatic, no cyanosis or edema and Homans sign is negative, no sign of DVT Wound: Sternum is stable  Lab Results: CBC: Recent Labs    11/26/18 0723 11/27/18 0257  WBC 13.5* 9.2  HGB 8.2* 7.8*  HCT 24.2* 21.4*  PLT 174 197   BMET:  Recent Labs    11/26/18 0723 11/27/18 0257  NA 132* 133*  K 4.6 4.2  CL 102 104  CO2 22 22  GLUCOSE 146* 99  BUN 38* 36*  CREATININE 1.45* 1.35*  CALCIUM 8.3* 8.0*    CMET: Lab Results  Component Value Date   WBC 9.2 11/27/2018   HGB 7.8 (L) 11/27/2018   HCT 21.4 (  L) 11/27/2018   PLT 197 11/27/2018   GLUCOSE 99 11/27/2018   CHOL 145 11/20/2018   TRIG 18 11/20/2018   HDL 72 11/20/2018   LDLCALC 69 11/20/2018   ALT 16 11/20/2018   AST 16 11/20/2018   NA 133 (L) 11/27/2018   K 4.2 11/27/2018   CL 104 11/27/2018   CREATININE 1.35 (H) 11/27/2018   BUN 36 (H) 11/27/2018   CO2 22 11/27/2018   TSH 3.22 04/16/2017   PSA 1.99 04/16/2017   INR 1.6 (H) 11/23/2018   HGBA1C 5.4 11/22/2018      PT/INR: No results for input(s): LABPROT, INR in the last 72 hours. Radiology: No results found. Chest x-ray today not read yet but shows continued decrease in size of small right apical pneumothorax  Assessment/Plan: S/P Procedure(s) (LRB): CORONARY ARTERY BYPASS GRAFTING (CABG), ON PUMP, TIMES FOUR,  LIMA to LAD, SEQ SVG to PDA, PLVB, SVG to CIRCUMFLEX, USING LEFT INTERNAL MAMMARY ARTERY AND RIGHT GREATER SAPHENOUS VEIN HARVESTED ENDOSCOPICALLY AND LEFT GREATER SAPHENOUS VEIN (N/A) TRANSESOPHAGEAL ECHOCARDIOGRAM (TEE) (N/A) Mobilize Diuresis Renal function is stable at baseline Hemoglobin has dropped since yesterday without obvious blood loss, currently tolerating and will try to avoid transfusion Plan discharge home tomorrow, if hemoglobin stable DC pacing wires today   [name] 11/27/2018 7:36 AM

## 2018-11-27 NOTE — Progress Notes (Signed)
CARDIAC REHAB PHASE I   PRE:  Rate/Rhythm: 77 SR  BP:  Supine:   Sitting: 109/80  Standing:    SaO2: 96%RA  MODE:  Ambulation: 370 ft   POST:  Rate/Rhythm: 92 SR  BP:  Supine:   Sitting: 125/81  Standing:    SaO2: 94%RA 1011-1030 Since pt for possible d/c tomorrow, wanted to try to walk without EVA.  Pt walked 370 ft on RA with hand held asst. Pt not sure if he would like walker for home. More exertion to walk without EVA and pt tired with this distance. To recliner after walk with call bell. Will ed when wife present.   Graylon Good, RN BSN  11/27/2018 10:26 AM

## 2018-11-27 NOTE — Progress Notes (Signed)
[id number] Education completed with pt and wife who voiced understanding. Stressed importance of adhering to sternal precautions, discussed staying in the tube and gave handout. Discussed heart healthy food choices, walking for ex, and CRP 2. Referred to [place] CRP 2. Discussed if pt would like a rolling walker for home use. Pt is hesitant to say yes, wife is leaning toward one.  Pt wants to think about and let staff know. Wrote down how to view discharge video.  Graylon Good RN BSN 11/27/2018 1:53 PM

## 2018-11-28 LAB — CBC
HCT: 23.4 % — ABNORMAL LOW (ref 39.0–52.0)
Hemoglobin: 7.7 g/dL — ABNORMAL LOW (ref 13.0–17.0)
MCH: 32.4 pg (ref 26.0–34.0)
MCHC: 32.9 g/dL (ref 30.0–36.0)
MCV: 98.3 fL (ref 80.0–100.0)
Platelets: 257 10*3/uL (ref 150–400)
RBC: 2.38 MIL/uL — ABNORMAL LOW (ref 4.22–5.81)
RDW: 13.6 % (ref 11.5–15.5)
WBC: 7.9 10*3/uL (ref 4.0–10.5)
nRBC: 0 % (ref 0.0–0.2)

## 2018-11-28 LAB — BASIC METABOLIC PANEL
Anion gap: 6 (ref 5–15)
BUN: 30 mg/dL — ABNORMAL HIGH (ref 8–23)
CO2: 22 mmol/L (ref 22–32)
Calcium: 8 mg/dL — ABNORMAL LOW (ref 8.9–10.3)
Chloride: 106 mmol/L (ref 98–111)
Creatinine, Ser: 1.26 mg/dL — ABNORMAL HIGH (ref 0.61–1.24)
GFR calc Af Amer: >60 mL/min (ref >60)
GFR calc non Af Amer: 57 mL/min — ABNORMAL LOW (ref >60)
Glucose, Bld: 89 mg/dL (ref 70–99)
Potassium: 4.5 mmol/L (ref 3.5–5.1)
Sodium: 134 mmol/L — ABNORMAL LOW (ref 135–145)

## 2018-11-28 MED ORDER — METOPROLOL TARTRATE 25 MG PO TABS
25.0000 mg | ORAL_TABLET | Freq: Two times a day (BID) | ORAL | Status: DC
  Administered 2018-11-28: 10:00:00 25 mg via ORAL
  Filled 2018-11-28: qty 1

## 2018-11-28 MED ORDER — ASPIRIN 325 MG PO TBEC: 325.0000 mg | DELAYED_RELEASE_TABLET | Freq: Every day | ORAL | 0 refills | Status: DC

## 2018-11-28 MED ORDER — ATORVASTATIN CALCIUM 80 MG PO TABS: 80.0000 mg | ORAL_TABLET | Freq: Every day | ORAL | 3 refills | Status: DC

## 2018-11-28 MED ORDER — AMIODARONE HCL 200 MG PO TABS: 200.0000 mg | ORAL_TABLET | Freq: Two times a day (BID) | ORAL | 1 refills | Status: DC

## 2018-11-28 MED ORDER — AMIODARONE HCL IN DEXTROSE 360-4.14 MG/200ML-% IV SOLN
60.0000 mg/h | INTRAVENOUS | Status: DC
  Administered 2018-11-28: 60 mg/h via INTRAVENOUS
  Filled 2018-11-28: qty 200

## 2018-11-28 MED ORDER — FERROUS GLUCONATE 324 (38 FE) MG PO TABS: 324.0000 mg | ORAL_TABLET | Freq: Two times a day (BID) | ORAL | 0 refills | Status: DC

## 2018-11-28 MED ORDER — FOLIC ACID 1 MG PO TABS: 1.0000 mg | ORAL_TABLET | Freq: Every day | ORAL | 0 refills | Status: DC

## 2018-11-28 MED ORDER — METOPROLOL TARTRATE 5 MG/5ML IV SOLN
5.0000 mg | Freq: Once | INTRAVENOUS | Status: AC
  Administered 2018-11-28: 5 mg via INTRAVENOUS
  Filled 2018-11-28: qty 5

## 2018-11-28 MED ORDER — TRAMADOL HCL 50 MG PO TABS: 50.0000 mg | ORAL_TABLET | ORAL | 0 refills | Status: DC | PRN

## 2018-11-28 MED ORDER — METOPROLOL TARTRATE 25 MG PO TABS: 25.0000 mg | ORAL_TABLET | Freq: Two times a day (BID) | ORAL | 3 refills | Status: DC

## 2018-11-28 NOTE — Progress Notes (Addendum)
[address]       [place] [address]             [phone]      5 Days Post-Op Procedure(s) (LRB): CORONARY ARTERY BYPASS GRAFTING (CABG), ON PUMP, TIMES FOUR, LIMA to LAD, SEQ SVG to PDA, PLVB, SVG to CIRCUMFLEX, USING LEFT INTERNAL MAMMARY ARTERY AND RIGHT GREATER SAPHENOUS VEIN HARVESTED ENDOSCOPICALLY AND LEFT GREATER SAPHENOUS VEIN (N/A) TRANSESOPHAGEAL ECHOCARDIOGRAM (TEE) (N/A)   Subjective:  No new complaints.  Continues to feel well.  He was started back on IV Amiodarone this morning.  + ambulation + BM  Objective: Vital signs in last 24 hours: Temp:  [98 F (36.7 C)-99.4 F (37.4 C)] 98.5 F (36.9 C) (10/17 0428) Pulse Rate:  [74-148] 148 (10/17 0634) Cardiac Rhythm: Normal sinus rhythm (10/16 1900) Resp:  [19-28] 28 (10/17 0634) BP: (109-156)/(64-92) 149/92 (10/17 0634) SpO2:  [94 %-100 %] 95 % (10/17 0634) Weight:  [73.8 kg] 73.8 kg (10/17 0428)  General appearance: alert, cooperative and no distress Heart: regular rate and rhythm Lungs: clear to auscultation bilaterally Abdomen: soft, non-tender; bowel sounds normal; no masses,  no organomegaly Extremities: edema trace Wound: clean and dry, staples remain in place on LLE  Lab Results: Recent Labs    11/27/18 0257 11/28/18 0304  WBC 9.2 7.9  HGB 7.8* 7.7*  HCT 21.4* 23.4*  PLT 197 257   BMET:  Recent Labs    11/27/18 0257 11/28/18 0304  NA 133* 134*  K 4.2 4.5  CL 104 106  CO2 22 22  GLUCOSE 99 89  BUN 36* 30*  CREATININE 1.35* 1.26*  CALCIUM 8.0* 8.0*    PT/INR: No results for input(s): LABPROT, INR in the last 72 hours. ABG    Component Value Date/Time   PHART 7.324 (L) 11/23/2018 1904   HCO3 21.5 11/23/2018 1904   TCO2 23 11/23/2018 1904   ACIDBASEDEF 4.0 (H) 11/23/2018 1904   O2SAT 97.0 11/23/2018 1904   CBG (last 3)  Recent Labs    11/26/18 1235 11/26/18 1639 11/26/18 2141  GLUCAP 113* 103* 101*    Assessment/Plan: S/P Procedure(s) (LRB):  CORONARY ARTERY BYPASS GRAFTING (CABG), ON PUMP, TIMES FOUR, LIMA to LAD, SEQ SVG to PDA, PLVB, SVG to CIRCUMFLEX, USING LEFT INTERNAL MAMMARY ARTERY AND RIGHT GREATER SAPHENOUS VEIN HARVESTED ENDOSCOPICALLY AND LEFT GREATER SAPHENOUS VEIN (N/A) TRANSESOPHAGEAL ECHOCARDIOGRAM (TEE) (N/A)  1. CV- H/O PAF, currently in NSR... question of A. Fib this morning called via nursing.. however review of tele strip shows PACs which is chronic for him, very brief run of of A. Fib... stop IV amiodarone as this is running through peripheral IV and patient is in NSR, will increase Lopressor 25 mg BID and continue Amiodarone tablets 2. Pulm- no acute issues, continue IS 3. Renal- creatinine stable, weight is stable, no on lasix 4. Expected post operative blood loss anemia, Hgb stable at 7.7, will discharge on iron 5. Dispo- patient stable, history of Atrial tachycardia questionable brief run of A. Fib this morning, placed on IV amiodarone through peripheral IV, I have stopped this as patient is in NSR, will increase Lopressor, continue oral Amiodarone, Hgb is stable, CHADSvasc score is 2, no need for anticoagulation, will plan to discharge home today  Addendum:  Patient has had no further Atrial Tachycardia, maintaining NSR, will proceed with discharge home today   LOS: 10 days    Jeff Graves 11/28/2018   EKG rhythm strip reviewed Discharge instructions  reviewed with patient Agree with above assessment and plan  Dahlia Byes MD

## 2018-11-28 NOTE — Progress Notes (Signed)
Discharged to home with family office visits in place teaching done  

## 2018-11-28 NOTE — Progress Notes (Signed)
CARDIAC REHAB PHASE I   PRE:  Rate/Rhythm: 75 NSR  BP:  Sitting: 139/71        SaO2: 94 RA  MODE:  Ambulation: 300 ft   POST:  Rate/Rhythm: 94 NSR  BP:  Sitting: 135/79        SaO2: 99 RA  0938 - 1000  Pt ambulated 300 ft with assistance x1. Pt denies CP and SOB. Gait was steady. Pt in bed with call bell within reach.  Philis Kendall, MS, ACSM CEP 11/28/2018 9:56 AM

## 2018-12-02 ENCOUNTER — Telehealth (HOSPITAL_COMMUNITY): Payer: Self-pay

## 2018-12-02 NOTE — Telephone Encounter (Signed)
Called patient to see if he is interested in the Cardiac Rehab Program. Patient expressed interest. Explained scheduling process and went over insurance, patient verbalized understanding. Will contact patient for scheduling once f/u has been completed.  °

## 2018-12-02 NOTE — Telephone Encounter (Signed)
Pt insurance is active and benefits verified through [employer]. Co-pay $15.00, DED $0.00/$0.00 met, out of pocket $2,400.00/$0.00 met, co-insurance 0%. No pre-authorization required. Patricia/HTA, 12/02/2018 @ 11:14AM, LGK#[id number]  Will contact patient to see if he is interested in the Cardiac Rehab Program. If interested, patient will need to complete follow up appt. Once completed, patient will be contacted for scheduling upon review by the RN Navigator.

## 2018-12-04 NOTE — Progress Notes (Signed)
Cardiology Office Note   Date:  12/11/2018   ID:  Jeff Graves, DOB 1945/05/15, MRN [id number]  PCP:  Patient, No Pcp Per  Cardiologist: Dr. Lauree Chandler, MD  Chief Complaint  Patient presents with  . Hospitalization Follow-up    History of Present Illness: Jeff Graves is a 73 y.o. male who presents for CABG follow-up, seen for Dr. Angelena Form.  Jeff Graves has a past medical history significant for hyperlipidemia, essential hypertension, atrial tachycardia, diverticulosis, cervical disc disorder and BPH with obstruction.   The patient reported experiencing exertional chest pain for approximately 1 year mostly while exercising at the gym which would resolve with rest.  He presented to Dr. Caryl Comes for routine follow-up for his atrial tachycardia 10/2018.  He admitted to experiencing chest discomfort that had become more severe in which the episodes were no longer resolving as quickly.  He was taken off flecainide and referred to Dr. Angelena Form for cardiac catheterization.  This was performed 11/18/2018 which showed multivessel CAD in which coronary bypass was indicated and therefore TCTS was consulted.  CABG x4 utilizing LIMA to LAD, SVG to PDA and PLVB and SVG to left circumflex was performed on 11/23/2018.  Post operative CXR showed right apical pneumothorax in which he underwent needle decompression and tolerated without difficulty.  Repeat CXR showed improvement. He did developed atrial fibrillation which was treated with IV amiodarone and he quickly converted to normal sinus rhythm and was transitioned to oral amiodarone.  He continued to maintain NSR throughout his stay.  His home losartan 50 mg and of Ranexa were discontinued.  Plan was for amiodarone 200 mg twice daily for 7 days then decrease to 200 mg daily thereafter.  He was started on ASA 325 mg daily as well as high intensity atorvastatin as follows with Dr. Servando Snare on 12/31/2018. He was discharged 11/28/2018.    Today, he is doing very well from a cardiac perspective. He has been walking, increasing his duration slowly and has not experienced any recurrent chest pain.  Is on high-dose ASA.  Denies recurrent palpitations.  Denies fevers, chills.  Sternal borders free of redness or oozing.  Sternal precautions reviewed with understanding per patient.  Is interested in cardiac rehabilitation.  He will be acceptable to participate at this point.  Has been compliant with his medications.  Discussed plan for amiodarone to continue until next follow-up appointment.  Is in NSR per radial palpation today.  Has follow-up with Dr. Servando Snare 12/31/2018.  Will follow with him thereafter for reassessment of amiodarone as well as lipid panel.  Overall doing very well.  Past Medical History:  Diagnosis Date  . Allergy    seasonal  . Atrial tachycardia ([place]) 06/04/2012   12/13-QRS duration-102 ms 6/14-QRS duration-98 ms   . DDD (degenerative disc disease)   . Diverticulosis of colon   . Hemorrhoids   . Hypercholesterolemia   . Hypertension   . PAC (premature atrial contraction)   . Renal calculus   . Shoulder pain   . Sinus bradycardia     Past Surgical History:  Procedure Laterality Date  . COLONOSCOPY    . CORONARY ARTERY BYPASS GRAFT N/A 11/23/2018   Procedure: CORONARY ARTERY BYPASS GRAFTING (CABG), ON PUMP, TIMES FOUR, LIMA to LAD, SEQ SVG to PDA, PLVB, SVG to CIRCUMFLEX, USING LEFT INTERNAL MAMMARY ARTERY AND RIGHT GREATER SAPHENOUS VEIN HARVESTED ENDOSCOPICALLY AND LEFT GREATER SAPHENOUS VEIN;  Surgeon: [name], MD;  Location: [facility];  Service: Open  Heart Surgery;  Laterality: N/A;  . LEFT HEART CATH AND CORONARY ANGIOGRAPHY N/A 11/18/2018   Procedure: LEFT HEART CATH AND CORONARY ANGIOGRAPHY;  Surgeon: Burnell Blanks, MD;  Location: [facility];  Service: Cardiovascular;  Laterality: N/A;  . None    . SHOULDER SURGERY Left 03/29/2014  . TEE WITHOUT CARDIOVERSION N/A 11/23/2018    Procedure: TRANSESOPHAGEAL ECHOCARDIOGRAM (TEE);  Surgeon: [name], MD;  Location: [facility];  Service: Open Heart Surgery;  Laterality: N/A;     Current Outpatient Medications  Medication Sig Dispense Refill  . amiodarone (PACERONE) 200 MG tablet Take 1 tablet (200 mg total) by mouth 2 (two) times daily. For 7 days, then decrease to 200 mg daily 60 tablet 1  . aspirin EC 325 MG EC tablet Take 1 tablet (325 mg total) by mouth daily. 30 tablet 0  . atorvastatin (LIPITOR) 80 MG tablet Take 1 tablet (80 mg total) by mouth daily at 6 PM. 30 tablet 3  . Cholecalciferol (VITAMIN D) 2000 units CAPS Take 2,000 Units by mouth daily.    . ferrous gluconate (FERGON) 324 MG tablet Take 1 tablet (324 mg total) by mouth 2 (two) times daily with a meal. 60 tablet 0  . folic acid (FOLVITE) 1 MG tablet Take 1 tablet (1 mg total) by mouth daily. 30 tablet 0  . loratadine (CLARITIN) 10 MG tablet Take 10 mg by mouth daily as needed for allergies.     . metoprolol tartrate (LOPRESSOR) 25 MG tablet Take 1 tablet (25 mg total) by mouth 2 (two) times daily. 60 tablet 3  . Multiple Vitamins-Minerals (MULTIVITAMIN WITH MINERALS) tablet Take 1 tablet by mouth daily.    . vitamin B-12 (CYANOCOBALAMIN) 1000 MCG tablet Take 1,000 mcg by mouth daily.     No current facility-administered medications for this visit.     Allergies:   Patient has no known allergies.    Social History:  The patient  reports that he quit smoking about 34 years ago. His smoking use included cigarettes. He has a 10.00 pack-year smoking history. He has never used smokeless tobacco. He reports that he does not drink alcohol or use drugs.   Family History:  The patient's family history includes Breast cancer in his sister; Heart attack (age of onset: 53) in his father; Lymphoma in his sister; Valvular heart disease (age of onset: 44) in his mother.    ROS:  Please see the history of present illness. Otherwise, review of systems are  positive for none.   All other systems are reviewed and negative.    PHYSICAL EXAM: VS:  There were no vitals taken for this visit. , BMI There is no height or weight on file to calculate BMI.   General: Well developed, well nourished, NAD Skin: Warm, dry, intact  Lungs:Clear to ausculation bilaterally. No wheezes, rales, or rhonchi. Breathing is unlabored. Cardiovascular: RRR with S1 S2. No murmurs Extremities: No edema. No clubbing or cyanosis. DP pulses 2+ bilaterally Neuro: Alert and oriented. No focal deficits. No facial asymmetry. MAE spontaneously. Psych: Responds to questions appropriately with normal affect.     EKG:  EKG is not ordered today.   Recent Labs: 11/20/2018: ALT 16 11/24/2018: Magnesium 3.0 11/28/2018: BUN 30; Creatinine, Ser 1.26; Hemoglobin 7.7; Platelets 257; Potassium 4.5; Sodium 134    Lipid Panel    Component Value Date/Time   CHOL 145 11/20/2018 0519   TRIG 18 11/20/2018 0519   HDL 72 11/20/2018 0519  CHOLHDL 2.0 11/20/2018 0519   VLDL 4 11/20/2018 0519   LDLCALC 69 11/20/2018 0519     Wt Readings from Last 3 Encounters:  11/28/18 162 lb 11.2 oz (73.8 kg)  11/04/18 163 lb 3.2 oz (74 kg)  11/27/17 163 lb (73.9 kg)     Other studies Reviewed: Additional studies/ records that were reviewed today include:   Cardiac catheterization 11/18/2018:  Prox RCA to Dist RCA lesion is 40% stenosed.  Dist RCA lesion is 60% stenosed.  RPAV lesion is 95% stenosed.  Prox Cx lesion is 80% stenosed.  Ost LAD to Prox LAD lesion is 99% stenosed.  Prox LAD to Mid LAD lesion is 80% stenosed.  1. Severe triple vessel CAD with heavily calcified vessels.  2. Severe stenosis ostial and mid LAD 3. Severe stenosis proximal circumflex 4. Severe stenosis large caliber posterolateral artery.   2D echo 11/18/18  1. Left ventricular ejection fraction, by visual estimation, is 60 to 65%. The left ventricle has normal function. Normal left ventricular size.  Left ventricular septal wall thickness was mildly increased. There is mildly increased left ventricular  hypertrophy. 2. Global right ventricle has normal systolic function.The right ventricular size is normal. No increase in right ventricular wall thickness. 3. Left atrial size was normal. 4. Right atrial size was normal. 5. The mitral valve is normal in structure. Mild mitral valve regurgitation. No evidence of mitral stenosis. 6. The tricuspid valve is normal in structure. Tricuspid valve regurgitation was not visualized by color flow Doppler. 7. The aortic valve is tricuspid Aortic valve regurgitation is mild by color flow Doppler. Mild aortic valve sclerosis without stenosis. 8. The pulmonic valve was normal in structure. Pulmonic valve regurgitation is not visualized by color flow Doppler. 9. The inferior vena cava is normal in size with greater than 50% respiratory variability, suggesting right atrial pressure of 3 mmHg  ASSESSMENT AND PLAN:  1.  CAD s/p recent CABG x4 11/23/2018: -Stable, doing well from a cardiac perspective -Incision lines free of redness or discharge -Sternal precautions reviewed, patient understands and agrees -Has interest in participating in cardiac rehabilitation program in which they are in the process of scheduling after follow-up -Continue high-dose ASA 325 mg daily, high intensity atorvastatin, metoprolol 25 twice daily  2.  Postoperative atrial fibrillation: -Patient experienced brief postoperative atrial fibrillation initially treated with IV amiodarone in which this was transitioned to amiodarone 200 mg twice daily for 7 days then amiodarone 200 mg daily thereafter>>>quick conversion to NSR in hospital>>no AC initiated  -NSR/SB per radial palpation today with no complaints of palpitations -We will plan to reassess at next office visit in approximately 6 weeks  3.  Hypertension: -Stable, 118/68 -Continue metoprolol 25 mg twice daily  4.   Mild AI/MR per echocardiogram: -No indication for replacement during recent CABG -We will follow with serial echocardiograms -No symptoms  5.  CKD stage II-III: -Creatinine appears to be in the 1.3-1.4 range therefore losartan was discontinued on hospital discharge  6.  History of atrial tachycardia: -Followed by Dr. Caryl Comes -No BB secondary to baseline sinus bradycardia   Current medicines are reviewed at length with the patient today.  The patient does not have concerns regarding medicines.  The following changes have been made:  no change  Labs/ tests ordered today include: None  No orders of the defined types were placed in this encounter.    Disposition:   FU with Dr. Angelena Form in 6 weeks  Signed, Kathyrn Drown, NP  12/11/2018 8:12  AM    [facility] [address], [place], [place]  [address] Phone: [phone]; Fax: [phone]

## 2018-12-07 ENCOUNTER — Other Ambulatory Visit: Payer: Self-pay

## 2018-12-07 ENCOUNTER — Encounter (INDEPENDENT_AMBULATORY_CARE_PROVIDER_SITE_OTHER): Payer: Self-pay | Admitting: *Deleted

## 2018-12-07 DIAGNOSIS — Z4802 Encounter for removal of sutures: Secondary | ICD-10-CM

## 2018-12-11 ENCOUNTER — Other Ambulatory Visit: Payer: Self-pay

## 2018-12-11 ENCOUNTER — Encounter: Payer: Self-pay | Admitting: Cardiology

## 2018-12-11 ENCOUNTER — Ambulatory Visit: Payer: PPO | Admitting: Cardiology

## 2018-12-11 VITALS — BP 118/68 | HR 55 | Ht 70.0 in | Wt 152.1 lb

## 2018-12-11 DIAGNOSIS — Z951 Presence of aortocoronary bypass graft: Secondary | ICD-10-CM

## 2018-12-11 DIAGNOSIS — I4891 Unspecified atrial fibrillation: Secondary | ICD-10-CM

## 2018-12-11 DIAGNOSIS — I25119 Atherosclerotic heart disease of native coronary artery with unspecified angina pectoris: Secondary | ICD-10-CM

## 2018-12-11 DIAGNOSIS — I10 Essential (primary) hypertension: Secondary | ICD-10-CM | POA: Diagnosis not present

## 2018-12-11 NOTE — Patient Instructions (Signed)
Medication Instructions:  Your physician recommends that you continue on your current medications as directed. Please refer to the Current Medication list given to you today.  Labwork: None ordered.  Testing/Procedures: None ordered.  Follow-Up: Your physician recommends that you schedule a follow-up appointment in:   6 weeks with Dr. Angelena Form    Any Other Special Instructions Will Be Listed Below (If Applicable).     If you need a refill on your cardiac medications before your next appointment, please call your pharmacy.

## 2018-12-14 ENCOUNTER — Telehealth (HOSPITAL_COMMUNITY): Payer: Self-pay

## 2018-12-14 NOTE — Telephone Encounter (Signed)
Called patient to see if he was interested in participating in the Cardiac Rehab Program. Patient stated yes. Patient will come in for orientation on 01/05/2019 @ 130PM and will attend the 1:15PM exercise class.  Tourist information centre manager.

## 2018-12-28 ENCOUNTER — Telehealth (HOSPITAL_COMMUNITY): Payer: Self-pay

## 2018-12-28 NOTE — Telephone Encounter (Signed)
Cardiac Rehab Medication Review by a Pharmacist  Does the patient  feel that his/her medications are working for him/her?  yes  Has the patient been experiencing any side effects to the medications prescribed?  no  Does the patient measure his/her own blood pressure or blood glucose at home?  yes Pt keeps BP log, last BP was 129/75.  Does the patient have any problems obtaining medications due to transportation or finances?   no  Understanding of regimen: good Understanding of indications: good Potential of compliance: excellent    Pharmacist comments: None   Berenice Bouton, PharmD PGY1 Pharmacy Resident Office phone: [phone] 12/28/2018 10:26 AM

## 2018-12-30 ENCOUNTER — Other Ambulatory Visit: Payer: Self-pay | Admitting: Cardiothoracic Surgery

## 2018-12-30 DIAGNOSIS — Z951 Presence of aortocoronary bypass graft: Secondary | ICD-10-CM

## 2018-12-31 ENCOUNTER — Ambulatory Visit (INDEPENDENT_AMBULATORY_CARE_PROVIDER_SITE_OTHER): Payer: Self-pay | Admitting: Cardiothoracic Surgery

## 2018-12-31 ENCOUNTER — Other Ambulatory Visit: Payer: Self-pay

## 2018-12-31 ENCOUNTER — Ambulatory Visit
Admission: RE | Admit: 2018-12-31 | Discharge: 2018-12-31 | Disposition: A | Discharge status: Home / Self Care | Payer: PPO | Source: Ambulatory Visit | Attending: Cardiothoracic Surgery | Admitting: Cardiothoracic Surgery

## 2018-12-31 VITALS — BP 132/75 | HR 47 | Temp 97.7°F | Resp 20 | Ht 70.0 in | Wt 151.0 lb

## 2018-12-31 DIAGNOSIS — Z951 Presence of aortocoronary bypass graft: Secondary | ICD-10-CM

## 2018-12-31 DIAGNOSIS — I2510 Atherosclerotic heart disease of native coronary artery without angina pectoris: Secondary | ICD-10-CM

## 2018-12-31 NOTE — Progress Notes (Signed)
[address]       [place] [address]             [phone]      Jeff Graves [facility] Medical Record [id number] Date of Birth: 09-Jan-1946  Referring: Burnell Blanks* Primary Care: Vivi Barrack, MD Primary Cardiologist: Lauree Chandler, MD   Chief Complaint:   POST OP FOLLOW UP OPERATIVE REPORT DATE OF PROCEDURE:  11/23/2018 PREOPERATIVE DIAGNOSIS:  Coronary occlusive disease with new onset of angina. POSTOPERATIVE DIAGNOSIS:  Coronary occlusive disease with new onset of angina. SURGICAL PROCEDURE:  Coronary artery bypass grafting x4 with the left internal mammary to the left anterior descending coronary artery, reverse saphenous vein graft to the circumflex coronary artery, sequential reverse saphenous vein graft to the  posterior descending and posterolateral branches of the right coronary artery with right thigh and calf endoscopic vein harvesting and open left calf greater saphenous vein harvesting.  History of Present Illness:     Patient is making good progress postoperatively, he notes he is ambulating daily without recurrent angina or evidence of congestive heart failure.  He has had no pedal edema     Past Medical History:  Diagnosis Date  . Allergy    seasonal  . Atrial tachycardia ([place]) 06/04/2012   12/13-QRS duration-102 ms 6/14-QRS duration-98 ms   . DDD (degenerative disc disease)   . Diverticulosis of colon   . Hemorrhoids   . Hypercholesterolemia   . Hypertension   . PAC (premature atrial contraction)   . Renal calculus   . Shoulder pain   . Sinus bradycardia      Social History   Tobacco Use  Smoking Status Former Smoker  . Packs/day: 0.50  . Years: 20.00  . Pack years: 10.00  . Types: Cigarettes  . Quit date: 02/12/1984  . Years since quitting: 34.9  Smokeless Tobacco Never Used    Social History   Substance and Sexual Activity  Alcohol Use No  . Alcohol/week: 0.0 standard drinks   Comment: social use     No Known Allergies  Current Outpatient Medications  Medication Sig Dispense Refill  . amiodarone (PACERONE) 200 MG tablet Take 200 mg by mouth daily.    [name] aspirin EC 325 MG EC tablet Take 1 tablet (325 mg total) by mouth daily. 30 tablet 0  . atorvastatin (LIPITOR) 80 MG tablet Take 1 tablet (80 mg total) by mouth daily at 6 PM. 30 tablet 3  . Cholecalciferol (VITAMIN D) 2000 units CAPS Take 2,000 Units by mouth daily.    . ferrous gluconate (FERGON) 324 MG tablet Take 1 tablet (324 mg total) by mouth 2 (two) times daily with a meal. 60 tablet 0  . folic acid (FOLVITE) 1 MG tablet Take 1 tablet (1 mg total) by mouth daily. 30 tablet 0  . loratadine (CLARITIN) 10 MG tablet Take 10 mg by mouth daily as needed for allergies.     . metoprolol tartrate (LOPRESSOR) 25 MG tablet Take 1 tablet (25 mg total) by mouth 2 (two) times daily. 60 tablet 3  . Multiple Vitamins-Minerals (MULTIVITAMIN WITH MINERALS) tablet Take 1 tablet by mouth daily.    . vitamin B-12 (CYANOCOBALAMIN) 1000 MCG tablet Take 1,000 mcg by mouth daily.     No current facility-administered medications for this visit.        Physical Exam: BP 132/75   Pulse (!) 47   Temp 97.7 F (36.5 C) (Skin)  Resp 20   Ht 5\' 10"  (1.778 m)   Wt 68.5 kg   SpO2 98% Comment: RA  BMI 21.67 kg/m   General appearance: alert, cooperative and no distress Neurologic: intact Heart: regular rate and rhythm, S1, S2 normal, no murmur, click, rub or gallop Lungs: clear to auscultation bilaterally Abdomen: soft, non-tender; bowel sounds normal; no masses,  no organomegaly Extremities: extremities normal, atraumatic, no cyanosis or edema and Homans sign is negative, no sign of DVT Wound: Wounds are well-healed both leg and sternum   Diagnostic Studies & Laboratory data:     Recent Radiology Findings:   Dg Chest 2 View  Result Date: 12/31/2018 CLINICAL DATA:  Post CABG follow-up EXAM: CHEST - 2 VIEW COMPARISON:   November 27, 2018 FINDINGS: The heart size and mediastinal contours are within normal limits. Post CABG. Both lungs are clear. No pleural effusion. The visualized skeletal structures are unremarkable. IMPRESSION: No acute process in the chest. Resolution of pleural effusions and bibasilar atelectasis. Electronically Signed   By: Macy Mis M.D.   On: 12/31/2018 10:21    I have independently reviewed the above radiology studies  and reviewed the findings with the patient.    Recent Lab Findings: Lab Results  Component Value Date   WBC 7.9 11/28/2018   HGB 7.7 (L) 11/28/2018   HCT 23.4 (L) 11/28/2018   PLT 257 11/28/2018   GLUCOSE 89 11/28/2018   CHOL 145 11/20/2018   TRIG 18 11/20/2018   HDL 72 11/20/2018   LDLCALC 69 11/20/2018   ALT 16 11/20/2018   AST 16 11/20/2018   NA 134 (L) 11/28/2018   K 4.5 11/28/2018   CL 106 11/28/2018   CREATININE 1.26 (H) 11/28/2018   BUN 30 (H) 11/28/2018   CO2 22 11/28/2018   TSH 3.22 04/16/2017   INR 1.6 (H) 11/23/2018   HGBA1C 5.4 11/22/2018      Assessment / Plan:      Patient stable following coronary artery bypass grafting, he is walking on a daily basis.  On Cordarone 200 mg a day for brief episode of atrial fibrillation early postop without evidence of recurrence-possible DC Cordarone on his next cardiology visit Patient works in Education [employer], community business, notes that he is frequently climbing attics and under houses and on ladders-we will wait on returning to work for 3 months postop   Medication Changes: No orders of the defined types were placed in this encounter.     [name] MD      [address] [place] [place] [address] Office [phone]     12/31/2018 11:17 AM

## 2019-01-05 ENCOUNTER — Encounter (HOSPITAL_COMMUNITY)
Admission: RE | Admit: 2019-01-05 | Discharge: 2019-01-05 | Disposition: A | Discharge status: Home / Self Care | Payer: PPO | Source: Ambulatory Visit | Attending: Cardiovascular Disease | Admitting: Cardiovascular Disease

## 2019-01-05 ENCOUNTER — Telehealth: Payer: Self-pay | Admitting: Cardiovascular Disease

## 2019-01-05 ENCOUNTER — Encounter (HOSPITAL_COMMUNITY): Payer: Self-pay

## 2019-01-05 ENCOUNTER — Other Ambulatory Visit: Payer: Self-pay

## 2019-01-05 VITALS — BP 104/56 | HR 46 | Temp 97.2°F | Ht 68.5 in | Wt 152.8 lb

## 2019-01-05 DIAGNOSIS — Z951 Presence of aortocoronary bypass graft: Secondary | ICD-10-CM | POA: Insufficient documentation

## 2019-01-05 NOTE — Progress Notes (Addendum)
Cardiac Individual Treatment Plan  Patient Details  Name: Jeff Graves MRN: [id number] Date of Birth: 03/27/1945 Referring Provider:     CARDIAC REHAB PHASE II ORIENTATION from 01/05/2019 in [place]  Referring Provider  Evalee Jefferson, MD      Initial Encounter Date:    CARDIAC REHAB PHASE II ORIENTATION from 01/05/2019 in [place]  Date  01/05/19      Visit Diagnosis: 11/23/18 CABG x4  Patient's Home Medications on Admission:  Current Outpatient Medications:  .  amiodarone (PACERONE) 200 MG tablet, Take 200 mg by mouth daily., Disp: , Rfl:  .  aspirin EC 325 MG EC tablet, Take 1 tablet (325 mg total) by mouth daily., Disp: 30 tablet, Rfl: 0 .  atorvastatin (LIPITOR) 80 MG tablet, Take 1 tablet (80 mg total) by mouth daily at 6 PM., Disp: 30 tablet, Rfl: 3 .  Cholecalciferol (VITAMIN D) 2000 units CAPS, Take 2,000 Units by mouth daily., Disp: , Rfl:  .  ferrous gluconate (FERGON) 324 MG tablet, Take 1 tablet (324 mg total) by mouth 2 (two) times daily with a meal., Disp: 60 tablet, Rfl: 0 .  folic acid (FOLVITE) 1 MG tablet, Take 1 tablet (1 mg total) by mouth daily., Disp: 30 tablet, Rfl: 0 .  loratadine (CLARITIN) 10 MG tablet, Take 10 mg by mouth daily as needed for allergies. , Disp: , Rfl:  .  metoprolol tartrate (LOPRESSOR) 25 MG tablet, Take 1 tablet (25 mg total) by mouth 2 (two) times daily., Disp: 60 tablet, Rfl: 3 .  Multiple Vitamins-Minerals (MULTIVITAMIN WITH MINERALS) tablet, Take 1 tablet by mouth daily., Disp: , Rfl:  .  vitamin B-12 (CYANOCOBALAMIN) 1000 MCG tablet, Take 1,000 mcg by mouth daily., Disp: , Rfl:   Past Medical History: Past Medical History:  Diagnosis Date  . Allergy    seasonal  . Atrial tachycardia ([place]) 06/04/2012   12/13-QRS duration-102 ms 6/14-QRS duration-98 ms   . DDD (degenerative disc disease)   . Diverticulosis of colon   . Hemorrhoids   .  Hypercholesterolemia   . Hypertension   . PAC (premature atrial contraction)   . Renal calculus   . Shoulder pain   . Sinus bradycardia     Tobacco Use: Social History   Tobacco Use  Smoking Status Former Smoker  . Packs/day: 0.50  . Years: 20.00  . Pack years: 10.00  . Types: Cigarettes  . Quit date: 02/12/1984  . Years since quitting: 34.9  Smokeless Tobacco Never Used    Labs: Recent Review Flowsheet Data    Labs for ITP Cardiac and Pulmonary Rehab Latest Ref Rng & Units 11/23/2018 11/23/2018 11/23/2018 11/23/2018 11/23/2018   Cholestrol 0 - 200 mg/dL - - - - -   LDLCALC 0 - 99 mg/dL - - - - -   HDL >40 mg/dL - - - - -   Trlycerides <150 mg/dL - - - - -   Hemoglobin A1c 4.8 - 5.6 % - - - - -   PHART 7.350 - 7.450 - 7.367 7.408 7.402 7.324(L)   PCO2ART 32.0 - 48.0 mmHg - 43.4 36.4 36.0 41.4   HCO3 20.0 - 28.0 mmol/L - 24.9 23.2 22.6 21.5   TCO2 22 - 32 mmol/L 26 26 24 24 23    ACIDBASEDEF 0.0 - 2.0 mmol/L - 1.0 1.0 2.0 4.0(H)   O2SAT % - 100.0 99.0 99.0 97.0      Capillary Blood Glucose: Lab  Results  Component Value Date   GLUCAP 101 (H) 11/26/2018   GLUCAP 103 (H) 11/26/2018   GLUCAP 113 (H) 11/26/2018   GLUCAP 104 (H) 11/26/2018   GLUCAP 96 11/26/2018     Exercise Target Goals: Exercise Program Goal: Individual exercise prescription set using results from initial 6 min walk test and THRR while considering  patient's activity barriers and safety.   Exercise Prescription Goal: Starting with aerobic activity 30 plus minutes a day, 3 days per week for initial exercise prescription. Provide home exercise prescription and guidelines that participant acknowledges understanding prior to discharge.  Activity Barriers & Risk Stratification: Activity Barriers & Cardiac Risk Stratification - 01/05/19 1424      Activity Barriers & Cardiac Risk Stratification   Activity Barriers  None    Cardiac Risk Stratification  High       6 Minute Walk: 6 Minute Walk    Row  Name 01/05/19 1425         6 Minute Walk   Phase  Initial     Distance  1355 feet     Walk Time  6 minutes     # of Rest Breaks  0     MPH  2.57     METS  2.78     RPE  9     Perceived Dyspnea   0     VO2 Peak  9.73     Symptoms  No     Resting HR  46 bpm     Resting BP  104/56     Resting Oxygen Saturation   100 %     Exercise Oxygen Saturation  during 6 min walk  97 %     Max Ex. HR  61 bpm     Max Ex. BP  112/62     2 Minute Post BP  92/62        Oxygen Initial Assessment:   Oxygen Re-Evaluation:   Oxygen Discharge (Final Oxygen Re-Evaluation):   Initial Exercise Prescription: Initial Exercise Prescription - 01/05/19 1600      Date of Initial Exercise RX and Referring Provider   Date  01/05/19    Referring Provider  Evalee Jefferson, MD    Expected Discharge Date  03/05/19      Treadmill   MPH  2.3    Grade  0    Minutes  15    METs  2.76      NuStep   Level  2    SPM  85    Minutes  15    METs  2.5      Prescription Details   Frequency (times per week)  3    Duration  Progress to 30 minutes of continuous aerobic without signs/symptoms of physical distress      Intensity   THRR 40-80% of Max Heartrate  60-119    Ratings of Perceived Exertion  11-13    Perceived Dyspnea  0-4      Progression   Progression  Continue to progress workloads to maintain intensity without signs/symptoms of physical distress.      Resistance Training   Training Prescription  Yes    Weight  3lbs    Reps  10-15       Perform Capillary Blood Glucose checks as needed.  Exercise Prescription Changes:   Exercise Comments:   Exercise Goals and Review:  Exercise Goals    Row Name 01/05/19 1424  Exercise Goals   Increase Physical Activity  Yes       Intervention  Provide advice, education, support and counseling about physical activity/exercise needs.;Develop an individualized exercise prescription for aerobic and resistive training based  on initial evaluation findings, risk stratification, comorbidities and participant's personal goals.       Expected Outcomes  Short Term: Attend rehab on a regular basis to increase amount of physical activity.;Long Term: Exercising regularly at least 3-5 days a week.;Long Term: Add in home exercise to make exercise part of routine and to increase amount of physical activity.       Increase Strength and Stamina  Yes       Intervention  Provide advice, education, support and counseling about physical activity/exercise needs.;Develop an individualized exercise prescription for aerobic and resistive training based on initial evaluation findings, risk stratification, comorbidities and participant's personal goals.       Expected Outcomes  Short Term: Increase workloads from initial exercise prescription for resistance, speed, and METs.;Short Term: Perform resistance training exercises routinely during rehab and add in resistance training at home;Long Term: Improve cardiorespiratory fitness, muscular endurance and strength as measured by increased METs and functional capacity (6MWT)       Able to understand and use rate of perceived exertion (RPE) scale  Yes       Intervention  Provide education and explanation on how to use RPE scale       Expected Outcomes  Short Term: Able to use RPE daily in rehab to express subjective intensity level;Long Term:  Able to use RPE to guide intensity level when exercising independently       Knowledge and understanding of Target Heart Rate Range (THRR)  Yes       Intervention  Provide education and explanation of THRR including how the numbers were predicted and where they are located for reference       Expected Outcomes  Short Term: Able to state/look up THRR;Long Term: Able to use THRR to govern intensity when exercising independently;Short Term: Able to use daily as guideline for intensity in rehab       Able to check pulse independently  Yes       Intervention  Provide  education and demonstration on how to check pulse in carotid and radial arteries.;Review the importance of being able to check your own pulse for safety during independent exercise       Expected Outcomes  Short Term: Able to explain why pulse checking is important during independent exercise;Long Term: Able to check pulse independently and accurately       Understanding of Exercise Prescription  Yes       Intervention  Provide education, explanation, and written materials on patient's individual exercise prescription       Expected Outcomes  Short Term: Able to explain program exercise prescription;Long Term: Able to explain home exercise prescription to exercise independently          Exercise Goals Re-Evaluation :    Discharge Exercise Prescription (Final Exercise Prescription Changes):   Nutrition:  Target Goals: Understanding of nutrition guidelines, daily intake of sodium 1500mg , cholesterol 200mg , calories 30% from fat and 7% or less from saturated fats, daily to have 5 or more servings of fruits and vegetables.  Biometrics: Pre Biometrics - 01/05/19 1423      Pre Biometrics   Height  5' 8.5" (1.74 m)    Weight  69.3 kg    Waist Circumference  35 inches  Hip Circumference  37.5 inches    Waist to Hip Ratio  0.93 %    BMI (Calculated)  22.89    Triceps Skinfold  12 mm    % Body Fat  22.6 %    Grip Strength  27.5 kg    Flexibility  13.75 in    Single Leg Stand  8.5 seconds        Nutrition Therapy Plan and Nutrition Goals:   Nutrition Assessments:   Nutrition Goals Re-Evaluation:   Nutrition Goals Discharge (Final Nutrition Goals Re-Evaluation):   Psychosocial: Target Goals: Acknowledge presence or absence of significant depression and/or stress, maximize coping skills, provide positive support system. Participant is able to verbalize types and ability to use techniques and skills needed for reducing stress and depression.  Initial Review & Psychosocial  Screening: Initial Psych Review & Screening - 01/05/19 1630      Initial Review   Current issues with  None Identified      Family Dynamics   Good Support System?  Yes    Comments  Patient denies barriers to participation in pulmonary rehab. He has a positive attitude and outlook. He has a strong support system including family, friends, and healthcare team. He is eager to participate in cardiac rehab      Barriers   Psychosocial barriers to participate in program  There are no identifiable barriers or psychosocial needs.      Screening Interventions   Interventions  Encouraged to exercise       Quality of Life Scores: Quality of Life - 01/05/19 1624      Quality of Life   Select  Quality of Life      Quality of Life Scores   Health/Function Pre  26.27 %    Socioeconomic Pre  24.64 %    Psych/Spiritual Pre  29.64 %    Family Pre  30 %    GLOBAL Pre  27.18 %      Scores of 19 and below usually indicate a poorer quality of life in these areas.  A difference of  2-3 points is a clinically meaningful difference.  A difference of 2-3 points in the total score of the Quality of Life Index has been associated with significant improvement in overall quality of life, self-image, physical symptoms, and general health in studies assessing change in quality of life.  PHQ-9: Recent Review Flowsheet Data    Depression screen [facility] 2/9 01/05/2019 04/16/2016 01/22/2013   Decreased Interest 0 0 0   Down, Depressed, Hopeless 0 0 0   PHQ - 2 Score 0 0 0     Interpretation of Total Score  Total Score Depression Severity:  1-4 = Minimal depression, 5-9 = Mild depression, 10-14 = Moderate depression, 15-19 = Moderately severe depression, 20-27 = Severe depression   Psychosocial Evaluation and Intervention:   Psychosocial Re-Evaluation:   Psychosocial Discharge (Final Psychosocial Re-Evaluation):   Vocational Rehabilitation: Provide vocational rehab assistance to qualifying  candidates.   Vocational Rehab Evaluation & Intervention:   Education: Education Goals: Education classes will be provided on a weekly basis, covering required topics. Participant will state understanding/return demonstration of topics presented.  Learning Barriers/Preferences: Learning Barriers/Preferences - 01/05/19 1632      Learning Barriers/Preferences   Learning Barriers  None    Learning Preferences  Audio;Verbal Instruction       Education Topics: Hypertension, Hypertension Reduction -Define heart disease and high blood pressure. Discus how high blood pressure affects the body and ways  to reduce high blood pressure.   Exercise and Your Heart -Discuss why it is important to exercise, the FITT principles of exercise, normal and abnormal responses to exercise, and how to exercise safely.   Angina -Discuss definition of angina, causes of angina, treatment of angina, and how to decrease risk of having angina.   Cardiac Medications -Review what the following cardiac medications are used for, how they affect the body, and side effects that may occur when taking the medications.  Medications include Aspirin, Beta blockers, calcium channel blockers, ACE Inhibitors, angiotensin receptor blockers, diuretics, digoxin, and antihyperlipidemics.   Congestive Heart Failure -Discuss the definition of CHF, how to live with CHF, the signs and symptoms of CHF, and how keep track of weight and sodium intake.   Heart Disease and Intimacy -Discus the effect sexual activity has on the heart, how changes occur during intimacy as we age, and safety during sexual activity.   Smoking Cessation / COPD -Discuss different methods to quit smoking, the health benefits of quitting smoking, and the definition of COPD.   Nutrition I: Fats -Discuss the types of cholesterol, what cholesterol does to the heart, and how cholesterol levels can be controlled.   Nutrition II: Labels -Discuss the  different components of food labels and how to read food label   Heart Parts/Heart Disease and PAD -Discuss the anatomy of the heart, the pathway of blood circulation through the heart, and these are affected by heart disease.   Stress I: Signs and Symptoms -Discuss the causes of stress, how stress may lead to anxiety and depression, and ways to limit stress.   Stress II: Relaxation -Discuss different types of relaxation techniques to limit stress.   Warning Signs of Stroke / TIA -Discuss definition of a stroke, what the signs and symptoms are of a stroke, and how to identify when someone is having stroke.   Knowledge Questionnaire Score: Knowledge Questionnaire Score - 01/05/19 1624      Knowledge Questionnaire Score   Pre Score  12/24       Core Components/Risk Factors/Patient Goals at Admission: Personal Goals and Risk Factors at Admission - 01/05/19 1624      Core Components/Risk Factors/Patient Goals on Admission   Hypertension  Yes    Intervention  Provide education on lifestyle modifcations including regular physical activity/exercise, weight management, moderate sodium restriction and increased consumption of fresh fruit, vegetables, and low fat dairy, alcohol moderation, and smoking cessation.;Monitor prescription use compliance.    Expected Outcomes  Short Term: Continued assessment and intervention until BP is < 140/84mm HG in hypertensive participants. < 130/65mm HG in hypertensive participants with diabetes, heart failure or chronic kidney disease.;Long Term: Maintenance of blood pressure at goal levels.    Lipids  Yes    Intervention  Provide education and support for participant on nutrition & aerobic/resistive exercise along with prescribed medications to achieve LDL 70mg , HDL >40mg .    Expected Outcomes  Short Term: Participant states understanding of desired cholesterol values and is compliant with medications prescribed. Participant is following exercise  prescription and nutrition guidelines.;Long Term: Cholesterol controlled with medications as prescribed, with individualized exercise RX and with personalized nutrition plan. Value goals: LDL < 70mg , HDL > 40 mg.       Core Components/Risk Factors/Patient Goals Review:    Core Components/Risk Factors/Patient Goals at Discharge (Final Review):    ITP Comments: ITP Comments    Row Name 01/05/19 1410           ITP  Comments  Medical Director- Dr. Fransico Him, MD          Comments: Patient attended orientation on 01/05/2019 to review rules and guidelines for program.  Completed 6 minute walk test, Intitial ITP, and exercise prescription.  VSS. Telemetry-SB rate 44 at rest increased to 61 with exertion.  Asymptomatic. Strips faxed to cardiology. Message left with Caryl Pina requesting call back from Dr. Camillia Herter nurse to discuss. Safety measures and social distancing in place per CDC guidelines.

## 2019-01-05 NOTE — Telephone Encounter (Signed)
Portia from Cardiac Rehab is calling stating that patient was bradycardic at rest today in office. She states it was as low as 44, but he is asymptomatic. She will be faxing over his EKG strips from today's appointment for Dr. Angelena Form to look over. She would also like for him to look over the patients medications.

## 2019-01-06 MED ORDER — METOPROLOL TARTRATE 25 MG PO TABS: 12.5000 mg | ORAL_TABLET | Freq: Two times a day (BID) | ORAL | 3 refills | Status: DC

## 2019-01-06 NOTE — Telephone Encounter (Signed)
Can we have him lower his metoprolol to 12.5 mg po BID? Thanks, chris

## 2019-01-06 NOTE — Telephone Encounter (Signed)
Pt notified. Will send updated prescription to Kristopher Oppenheim at [facility].

## 2019-01-11 ENCOUNTER — Encounter (HOSPITAL_COMMUNITY)
Admission: RE | Admit: 2019-01-11 | Discharge: 2019-01-11 | Disposition: A | Discharge status: Home / Self Care | Payer: PPO | Source: Ambulatory Visit | Attending: Cardiovascular Disease | Admitting: Cardiovascular Disease

## 2019-01-11 ENCOUNTER — Encounter (HOSPITAL_COMMUNITY): Payer: PPO

## 2019-01-11 ENCOUNTER — Other Ambulatory Visit: Payer: Self-pay

## 2019-01-11 DIAGNOSIS — Z951 Presence of aortocoronary bypass graft: Secondary | ICD-10-CM | POA: Diagnosis not present

## 2019-01-11 NOTE — Telephone Encounter (Signed)
Tracings received and given to Dr. Angelena Form to review.

## 2019-01-11 NOTE — Progress Notes (Addendum)
Daily Session Note  Patient Details  Name: VENKAT ANKNEY MRN: [id number] Date of Birth: 1945/11/06 Referring Provider:     [place] from 01/05/2019 in [place]  Referring Provider  Evalee Jefferson, MD      Encounter Date: 01/11/2019  Check In: Session Check In - 01/11/19 1611      Check-In   Supervising physician immediately available to respond to emergencies  [facility] immediately available    Physician(s)  Dr. Tyrell Antonio    Location  [facility]    Staff Present  Deitra Mayo, BS, ACSM CEP, Exercise Physiologist;Maria Whitaker, RN, Toma Deiters, RN, Mosie Epstein, MS,ACSM CEP, Exercise Physiologist;Olinty Celesta Aver, MS, ACSM CEP, Exercise Physiologist    Virtual Visit  No    Medication changes reported      No    Fall or balance concerns reported     No    Tobacco Cessation  No Change    Warm-up and Cool-down  Performed on first and last piece of equipment    Resistance Training Performed  Yes    VAD Patient?  No    PAD/SET Patient?  No      Pain Assessment   Currently in Pain?  No/denies    Pain Score  0-No pain    Multiple Pain Sites  No       Capillary Blood Glucose: No results found for this or any previous visit (from the past 24 hour(s)).    Social History   Tobacco Use  Smoking Status Former Smoker  . Packs/day: 0.50  . Years: 20.00  . Pack years: 10.00  . Types: Cigarettes  . Quit date: 02/12/1984  . Years since quitting: 34.9  Smokeless Tobacco Never Used    Goals Met:  Exercise tolerated well No report of cardiac concerns or symptoms Strength training completed today  Goals Unmet:  Not Applicable  Comments: Pt started cardiac rehab today.  Pt tolerated light exercise without difficulty. VSS, telemetry-Sinus Brady,Sinus  Rhythm, asymptomatic.  Medication list reconciled. Pt denies barriers to medicaiton compliance.  PSYCHOSOCIAL ASSESSMENT:   PHQ-0. Pt exhibits positive coping skills, hopeful outlook with supportive family. No psychosocial needs identified at this time, no psychosocial interventions necessary.      Pt oriented to exercise equipment and routine.    Understanding verbalized.Barnet Pall, RN,BSN 01/12/2019 8:35 AM   Dr. Fransico Him is Medical Director for Cardiac Rehab at [facility].

## 2019-01-13 ENCOUNTER — Encounter (HOSPITAL_COMMUNITY): Payer: PPO

## 2019-01-13 ENCOUNTER — Other Ambulatory Visit: Payer: Self-pay

## 2019-01-13 ENCOUNTER — Encounter (HOSPITAL_COMMUNITY)
Admission: RE | Admit: 2019-01-13 | Discharge: 2019-01-13 | Disposition: A | Discharge status: Home / Self Care | Payer: PPO | Source: Ambulatory Visit | Attending: Cardiovascular Disease | Admitting: Cardiovascular Disease

## 2019-01-13 DIAGNOSIS — Z951 Presence of aortocoronary bypass graft: Secondary | ICD-10-CM | POA: Diagnosis not present

## 2019-01-15 ENCOUNTER — Encounter (HOSPITAL_COMMUNITY)
Admission: RE | Admit: 2019-01-15 | Discharge: 2019-01-15 | Disposition: A | Discharge status: Home / Self Care | Payer: PPO | Source: Ambulatory Visit | Attending: Cardiovascular Disease | Admitting: Cardiovascular Disease

## 2019-01-15 ENCOUNTER — Other Ambulatory Visit: Payer: Self-pay

## 2019-01-15 ENCOUNTER — Encounter (HOSPITAL_COMMUNITY): Payer: PPO

## 2019-01-15 DIAGNOSIS — Z951 Presence of aortocoronary bypass graft: Secondary | ICD-10-CM

## 2019-01-18 ENCOUNTER — Encounter (HOSPITAL_COMMUNITY)
Admission: RE | Admit: 2019-01-18 | Discharge: 2019-01-18 | Disposition: A | Discharge status: Home / Self Care | Payer: PPO | Source: Ambulatory Visit | Attending: Cardiovascular Disease | Admitting: Cardiovascular Disease

## 2019-01-18 ENCOUNTER — Encounter (HOSPITAL_COMMUNITY): Payer: PPO

## 2019-01-18 ENCOUNTER — Other Ambulatory Visit: Payer: Self-pay

## 2019-01-18 DIAGNOSIS — Z951 Presence of aortocoronary bypass graft: Secondary | ICD-10-CM

## 2019-01-19 NOTE — Progress Notes (Signed)
Jeff Graves 73 y.o. male Nutrition Note  Visit Diagnosis: 11/23/18 CABG x4  Past Medical History:  Diagnosis Date  . Allergy    seasonal  . Atrial tachycardia ([place]) 06/04/2012   12/13-QRS duration-102 ms 6/14-QRS duration-98 ms   . DDD (degenerative disc disease)   . Diverticulosis of colon   . Hemorrhoids   . Hypercholesterolemia   . Hypertension   . PAC (premature atrial contraction)   . Renal calculus   . Shoulder pain   . Sinus bradycardia      Medications reviewed.   Current Outpatient Medications:  .  amiodarone (PACERONE) 200 MG tablet, Take 200 mg by mouth daily., Disp: , Rfl:  .  aspirin EC 325 MG EC tablet, Take 1 tablet (325 mg total) by mouth daily., Disp: 30 tablet, Rfl: 0 .  atorvastatin (LIPITOR) 80 MG tablet, Take 1 tablet (80 mg total) by mouth daily at 6 PM., Disp: 30 tablet, Rfl: 3 .  Cholecalciferol (VITAMIN D) 2000 units CAPS, Take 2,000 Units by mouth daily., Disp: , Rfl:  .  ferrous gluconate (FERGON) 324 MG tablet, Take 1 tablet (324 mg total) by mouth 2 (two) times daily with a meal., Disp: 60 tablet, Rfl: 0 .  folic acid (FOLVITE) 1 MG tablet, Take 1 tablet (1 mg total) by mouth daily., Disp: 30 tablet, Rfl: 0 .  loratadine (CLARITIN) 10 MG tablet, Take 10 mg by mouth daily as needed for allergies. , Disp: , Rfl:  .  metoprolol tartrate (LOPRESSOR) 25 MG tablet, Take 0.5 tablets (12.5 mg total) by mouth 2 (two) times daily., Disp: 90 tablet, Rfl: 3 .  Multiple Vitamins-Minerals (MULTIVITAMIN WITH MINERALS) tablet, Take 1 tablet by mouth daily., Disp: , Rfl:  .  vitamin B-12 (CYANOCOBALAMIN) 1000 MCG tablet, Take 1,000 mcg by mouth daily., Disp: , Rfl:    Ht Readings from Last 1 Encounters:  01/05/19 5' 8.5" (1.74 m)     Wt Readings from Last 3 Encounters:  01/05/19 152 lb 12.5 oz (69.3 kg)  12/31/18 151 lb (68.5 kg)  12/11/18 152 lb 1.9 oz (69 kg)    Social History   Tobacco Use  Smoking Status Former Smoker  . Packs/day: 0.50  .  Years: 20.00  . Pack years: 10.00  . Types: Cigarettes  . Quit date: 02/12/1984  . Years since quitting: 34.9  Smokeless Tobacco Never Used     Lab Results  Component Value Date   CHOL 145 11/20/2018   Lab Results  Component Value Date   HDL 72 11/20/2018   Lab Results  Component Value Date   LDLCALC 69 11/20/2018   Lab Results  Component Value Date   TRIG 18 11/20/2018   Lab Results  Component Value Date   CHOLHDL 2.0 11/20/2018     Lab Results  Component Value Date   HGBA1C 5.4 11/22/2018     CBG (last 3)  No results for input(s): GLUCAP in the last 72 hours.   Nutrition Note  Spoke with pt. Nutrition Plan and Nutrition Survey goals reviewed with pt. Pt is not following a Heart Healthy diet.  Pt interested in making attainable diet changes. Provided appropriate recommendations and pt verbalized agreement.   Per discussion, pt does use canned/convenience foods often. Pt does not add salt to food. Pt does eat out frequently.   Pt expressed understanding of the information reviewed.   Nutrition Diagnosis ? Food-and nutrition-related knowledge deficit related to lack of exposure to information as related to  diagnosis of: ? CVD ?    Nutrition Intervention ? Pt's individual nutrition plan reviewed with pt. ? Benefits of adopting Heart Healthy diet discussed when Medficts reviewed.   ? Continue client-centered nutrition education by RD, as part of interdisciplinary care.  Goal(s) ? Pt to identify and limit food sources of saturated fat, trans fat, refined carbohydrates and sodium ? Reduce fried foods and sugary beverages with long term goal of eliminating   Plan:   Pt to attend nutrition classes ? Nutrition I ? Nutrition II ? Portion Distortion   Will provide client-centered nutrition education as part of interdisciplinary care  Monitor and evaluate progress toward nutrition goal with team.   Michaele Offer, MS, RDN, LDN

## 2019-01-20 ENCOUNTER — Encounter (HOSPITAL_COMMUNITY): Payer: PPO

## 2019-01-20 ENCOUNTER — Other Ambulatory Visit: Payer: Self-pay

## 2019-01-20 ENCOUNTER — Encounter (HOSPITAL_COMMUNITY)
Admission: RE | Admit: 2019-01-20 | Discharge: 2019-01-20 | Disposition: A | Discharge status: Home / Self Care | Payer: PPO | Source: Ambulatory Visit | Attending: Cardiovascular Disease | Admitting: Cardiovascular Disease

## 2019-01-20 DIAGNOSIS — Z951 Presence of aortocoronary bypass graft: Secondary | ICD-10-CM

## 2019-01-21 NOTE — Progress Notes (Signed)
Cardiac Individual Treatment Plan  Patient Details  Name: Jeff Graves MRN: [id number] Date of Birth: 28-Oct-1945 Referring Provider:     CARDIAC REHAB PHASE II ORIENTATION from 01/05/2019 in [place]  Referring Provider  Evalee Jefferson, MD      Initial Encounter Date:    CARDIAC REHAB PHASE II ORIENTATION from 01/05/2019 in [place]  Date  01/05/19      Visit Diagnosis: 11/23/18 CABG x4  Patient's Home Medications on Admission:  Current Outpatient Medications:  .  amiodarone (PACERONE) 200 MG tablet, Take 200 mg by mouth daily., Disp: , Rfl:  .  aspirin EC 325 MG EC tablet, Take 1 tablet (325 mg total) by mouth daily., Disp: 30 tablet, Rfl: 0 .  atorvastatin (LIPITOR) 80 MG tablet, Take 1 tablet (80 mg total) by mouth daily at 6 PM., Disp: 30 tablet, Rfl: 3 .  Cholecalciferol (VITAMIN D) 2000 units CAPS, Take 2,000 Units by mouth daily., Disp: , Rfl:  .  ferrous gluconate (FERGON) 324 MG tablet, Take 1 tablet (324 mg total) by mouth 2 (two) times daily with a meal., Disp: 60 tablet, Rfl: 0 .  folic acid (FOLVITE) 1 MG tablet, Take 1 tablet (1 mg total) by mouth daily., Disp: 30 tablet, Rfl: 0 .  loratadine (CLARITIN) 10 MG tablet, Take 10 mg by mouth daily as needed for allergies. , Disp: , Rfl:  .  metoprolol tartrate (LOPRESSOR) 25 MG tablet, Take 0.5 tablets (12.5 mg total) by mouth 2 (two) times daily., Disp: 90 tablet, Rfl: 3 .  Multiple Vitamins-Minerals (MULTIVITAMIN WITH MINERALS) tablet, Take 1 tablet by mouth daily., Disp: , Rfl:  .  vitamin B-12 (CYANOCOBALAMIN) 1000 MCG tablet, Take 1,000 mcg by mouth daily., Disp: , Rfl:   Past Medical History: Past Medical History:  Diagnosis Date  . Allergy    seasonal  . Atrial tachycardia ([place]) 06/04/2012   12/13-QRS duration-102 ms 6/14-QRS duration-98 ms   . DDD (degenerative disc disease)   . Diverticulosis of colon   . Hemorrhoids   .  Hypercholesterolemia   . Hypertension   . PAC (premature atrial contraction)   . Renal calculus   . Shoulder pain   . Sinus bradycardia     Tobacco Use: Social History   Tobacco Use  Smoking Status Former Smoker  . Packs/day: 0.50  . Years: 20.00  . Pack years: 10.00  . Types: Cigarettes  . Quit date: 02/12/1984  . Years since quitting: 34.9  Smokeless Tobacco Never Used    Labs: Recent Review Flowsheet Data    Labs for ITP Cardiac and Pulmonary Rehab Latest Ref Rng & Units 11/23/2018 11/23/2018 11/23/2018 11/23/2018 11/23/2018   Cholestrol 0 - 200 mg/dL - - - - -   LDLCALC 0 - 99 mg/dL - - - - -   HDL >40 mg/dL - - - - -   Trlycerides <150 mg/dL - - - - -   Hemoglobin A1c 4.8 - 5.6 % - - - - -   PHART 7.350 - 7.450 - 7.367 7.408 7.402 7.324(L)   PCO2ART 32.0 - 48.0 mmHg - 43.4 36.4 36.0 41.4   HCO3 20.0 - 28.0 mmol/L - 24.9 23.2 22.6 21.5   TCO2 22 - 32 mmol/L 26 26 24 24 23    ACIDBASEDEF 0.0 - 2.0 mmol/L - 1.0 1.0 2.0 4.0(H)   O2SAT % - 100.0 99.0 99.0 97.0      Capillary Blood Glucose: Lab  Results  Component Value Date   GLUCAP 101 (H) 11/26/2018   GLUCAP 103 (H) 11/26/2018   GLUCAP 113 (H) 11/26/2018   GLUCAP 104 (H) 11/26/2018   GLUCAP 96 11/26/2018     Exercise Target Goals: Exercise Program Goal: Individual exercise prescription set using results from initial 6 min walk test and THRR while considering  patient's activity barriers and safety.   Exercise Prescription Goal: Starting with aerobic activity 30 plus minutes a day, 3 days per week for initial exercise prescription. Provide home exercise prescription and guidelines that participant acknowledges understanding prior to discharge.  Activity Barriers & Risk Stratification: Activity Barriers & Cardiac Risk Stratification - 01/05/19 1424      Activity Barriers & Cardiac Risk Stratification   Activity Barriers  None    Cardiac Risk Stratification  High       6 Minute Walk: 6 Minute Walk    Row  Name 01/05/19 1425         6 Minute Walk   Phase  Initial     Distance  1355 feet     Walk Time  6 minutes     # of Rest Breaks  0     MPH  2.57     METS  2.78     RPE  9     Perceived Dyspnea   0     VO2 Peak  9.73     Symptoms  No     Resting HR  46 bpm     Resting BP  104/56     Resting Oxygen Saturation   100 %     Exercise Oxygen Saturation  during 6 min walk  97 %     Max Ex. HR  61 bpm     Max Ex. BP  112/62     2 Minute Post BP  92/62        Oxygen Initial Assessment:   Oxygen Re-Evaluation:   Oxygen Discharge (Final Oxygen Re-Evaluation):   Initial Exercise Prescription: Initial Exercise Prescription - 01/05/19 1600      Date of Initial Exercise RX and Referring Provider   Date  01/05/19    Referring Provider  Evalee Jefferson, MD    Expected Discharge Date  03/05/19      Treadmill   MPH  2.3    Grade  0    Minutes  15    METs  2.76      NuStep   Level  2    SPM  85    Minutes  15    METs  2.5      Prescription Details   Frequency (times per week)  3    Duration  Progress to 30 minutes of continuous aerobic without signs/symptoms of physical distress      Intensity   THRR 40-80% of Max Heartrate  60-119    Ratings of Perceived Exertion  11-13    Perceived Dyspnea  0-4      Progression   Progression  Continue to progress workloads to maintain intensity without signs/symptoms of physical distress.      Resistance Training   Training Prescription  Yes    Weight  3lbs    Reps  10-15       Perform Capillary Blood Glucose checks as needed.  Exercise Prescription Changes: Exercise Prescription Changes    Row Name 01/11/19 1520 01/18/19 1520           Response to Exercise  Blood Pressure (Admit)  120/70  104/70      Blood Pressure (Exercise)  108/72  136/70      Blood Pressure (Exit)  118/62  112/72      Heart Rate (Admit)  56 bpm  51 bpm      Heart Rate (Exercise)  81 bpm  93 bpm      Heart Rate (Exit)  53 bpm  56 bpm       Rating of Perceived Exertion (Exercise)  11  9      Symptoms  none  none      Comments  Off to a good start with exercise.  -      Duration  Progress to 30 minutes of  aerobic without signs/symptoms of physical distress  Progress to 30 minutes of  aerobic without signs/symptoms of physical distress      Intensity  THRR unchanged  THRR unchanged        Progression   Progression  Continue to progress workloads to maintain intensity without signs/symptoms of physical distress.  Continue to progress workloads to maintain intensity without signs/symptoms of physical distress.      Average METs  2.6  2.5        Resistance Training   Training Prescription  Yes  Yes      Weight  3lbs  3lbs      Reps  10-15  10-15      Time  10 Minutes  10 Minutes        Interval Training   Interval Training  -  No        Treadmill   MPH  2.3  2.3      Grade  0  0      Minutes  15  15      METs  2.76  2.76        NuStep   Level  2  4      SPM  85  85      Minutes  15  15      METs  2.5  2.3         Exercise Comments: Exercise Comments    Row Name 01/11/19 1610 01/20/19 1557         Exercise Comments  Patient tolerated first session of exercise well without symptoms.  Reviewed METs and goals with patient.         Exercise Goals and Review: Exercise Goals    Row Name 01/05/19 1424             Exercise Goals   Increase Physical Activity  Yes       Intervention  Provide advice, education, support and counseling about physical activity/exercise needs.;Develop an individualized exercise prescription for aerobic and resistive training based on initial evaluation findings, risk stratification, comorbidities and participant's personal goals.       Expected Outcomes  Short Term: Attend rehab on a regular basis to increase amount of physical activity.;Long Term: Exercising regularly at least 3-5 days a week.;Long Term: Add in home exercise to make exercise part of routine and to increase amount  of physical activity.       Increase Strength and Stamina  Yes       Intervention  Provide advice, education, support and counseling about physical activity/exercise needs.;Develop an individualized exercise prescription for aerobic and resistive training based on initial evaluation findings, risk stratification, comorbidities and participant's personal goals.       Expected  Outcomes  Short Term: Increase workloads from initial exercise prescription for resistance, speed, and METs.;Short Term: Perform resistance training exercises routinely during rehab and add in resistance training at home;Long Term: Improve cardiorespiratory fitness, muscular endurance and strength as measured by increased METs and functional capacity (6MWT)       Able to understand and use rate of perceived exertion (RPE) scale  Yes       Intervention  Provide education and explanation on how to use RPE scale       Expected Outcomes  Short Term: Able to use RPE daily in rehab to express subjective intensity level;Long Term:  Able to use RPE to guide intensity level when exercising independently       Knowledge and understanding of Target Heart Rate Range (THRR)  Yes       Intervention  Provide education and explanation of THRR including how the numbers were predicted and where they are located for reference       Expected Outcomes  Short Term: Able to state/look up THRR;Long Term: Able to use THRR to govern intensity when exercising independently;Short Term: Able to use daily as guideline for intensity in rehab       Able to check pulse independently  Yes       Intervention  Provide education and demonstration on how to check pulse in carotid and radial arteries.;Review the importance of being able to check your own pulse for safety during independent exercise       Expected Outcomes  Short Term: Able to explain why pulse checking is important during independent exercise;Long Term: Able to check pulse independently and accurately        Understanding of Exercise Prescription  Yes       Intervention  Provide education, explanation, and written materials on patient's individual exercise prescription       Expected Outcomes  Short Term: Able to explain program exercise prescription;Long Term: Able to explain home exercise prescription to exercise independently          Exercise Goals Re-Evaluation : Exercise Goals Re-Evaluation    Row Name 01/11/19 1610 01/20/19 1557           Exercise Goal Re-Evaluation   Exercise Goals Review  Able to understand and use rate of perceived exertion (RPE) scale;Increase Physical Activity  Able to understand and use rate of perceived exertion (RPE) scale;Increase Physical Activity;Increase Strength and Stamina;Able to check pulse independently      Comments  Patient able to understand and use RPE scale appropriately.  Patient was previously exercising at the gym using the treadmill and stepper 30 minutes, and his goal is to resume his previous exercise routine. Patient has a HR monitor and is able to check his pulse independently. Reviewed resting heart rate and parameters for cool down and heart rate after cool down with patient. Pt verbalizes understanding.      Expected Outcomes  Progress workloads as tolerated to help improve cardiorespiratory fitness.  Increase workloads as tolerated to help build strength and confidence with exercise routine so that patient can resume exercise at gym safely.          Discharge Exercise Prescription (Final Exercise Prescription Changes): Exercise Prescription Changes - 01/18/19 1520      Response to Exercise   Blood Pressure (Admit)  104/70    Blood Pressure (Exercise)  136/70    Blood Pressure (Exit)  112/72    Heart Rate (Admit)  51 bpm    Heart Rate (  Exercise)  93 bpm    Heart Rate (Exit)  56 bpm    Rating of Perceived Exertion (Exercise)  9    Symptoms  none    Duration  Progress to 30 minutes of  aerobic without signs/symptoms of physical  distress    Intensity  THRR unchanged      Progression   Progression  Continue to progress workloads to maintain intensity without signs/symptoms of physical distress.    Average METs  2.5      Resistance Training   Training Prescription  Yes    Weight  3lbs    Reps  10-15    Time  10 Minutes      Interval Training   Interval Training  No      Treadmill   MPH  2.3    Grade  0    Minutes  15    METs  2.76      NuStep   Level  4    SPM  85    Minutes  15    METs  2.3       Nutrition:  Target Goals: Understanding of nutrition guidelines, daily intake of sodium 1500mg , cholesterol 200mg , calories 30% from fat and 7% or less from saturated fats, daily to have 5 or more servings of fruits and vegetables.  Biometrics: Pre Biometrics - 01/05/19 1423      Pre Biometrics   Height  5' 8.5" (1.74 m)    Weight  69.3 kg    Waist Circumference  35 inches    Hip Circumference  37.5 inches    Waist to Hip Ratio  0.93 %    BMI (Calculated)  22.89    Triceps Skinfold  12 mm    % Body Fat  22.6 %    Grip Strength  27.5 kg    Flexibility  13.75 in    Single Leg Stand  8.5 seconds        Nutrition Therapy Plan and Nutrition Goals: Nutrition Therapy & Goals - 01/19/19 0805      Nutrition Therapy   Diet  Low Sodium/Heart Healthy    Drug/Food Interactions  Statins/Certain Fruits      Personal Nutrition Goals   Nutrition Goal  Pt to identify and limit food sources of saturated fat, trans fat, refined carbohydrates and sodium    Personal Goal #2  Pt to limit fried foods and sugary beverages with goal of eliminating      Intervention Plan   Intervention  Prescribe, educate and counsel regarding individualized specific dietary modifications aiming towards targeted core components such as weight, hypertension, lipid management, diabetes, heart failure and other comorbidities.;Nutrition handout(s) given to patient.    Expected Outcomes  Short Term Goal: Understand basic principles  of dietary content, such as calories, fat, sodium, cholesterol and nutrients.;Short Term Goal: A plan has been developed with personal nutrition goals set during dietitian appointment.;Long Term Goal: Adherence to prescribed nutrition plan.       Nutrition Assessments: Nutrition Assessments - 01/19/19 0808      MEDFICTS Scores   Pre Score  42       Nutrition Goals Re-Evaluation: Nutrition Goals Re-Evaluation    Row Name 01/19/19 0808             Goals   Current Weight  152 lb (68.9 kg)       Nutrition Goal  Pt to identify and limit food sources of saturated fat, trans fat, refined carbohydrates and  sodium         Personal Goal #2 Re-Evaluation   Personal Goal #2  Pt to limit fried foods and sugary beverages with goal of eliminating          Nutrition Goals Discharge (Final Nutrition Goals Re-Evaluation): Nutrition Goals Re-Evaluation - 01/19/19 0808      Goals   Current Weight  152 lb (68.9 kg)    Nutrition Goal  Pt to identify and limit food sources of saturated fat, trans fat, refined carbohydrates and sodium      Personal Goal #2 Re-Evaluation   Personal Goal #2  Pt to limit fried foods and sugary beverages with goal of eliminating       Psychosocial: Target Goals: Acknowledge presence or absence of significant depression and/or stress, maximize coping skills, provide positive support system. Participant is able to verbalize types and ability to use techniques and skills needed for reducing stress and depression.  Initial Review & Psychosocial Screening: Initial Psych Review & Screening - 01/05/19 1630      Initial Review   Current issues with  None Identified      Family Dynamics   Good Support System?  Yes    Comments  Patient denies barriers to participation in pulmonary rehab. He has a positive attitude and outlook. He has a strong support system including family, friends, and healthcare team. He is eager to participate in cardiac rehab      Barriers    Psychosocial barriers to participate in program  There are no identifiable barriers or psychosocial needs.      Screening Interventions   Interventions  Encouraged to exercise       Quality of Life Scores: Quality of Life - 01/05/19 1624      Quality of Life   Select  Quality of Life      Quality of Life Scores   Health/Function Pre  26.27 %    Socioeconomic Pre  24.64 %    Psych/Spiritual Pre  29.64 %    Family Pre  30 %    GLOBAL Pre  27.18 %      Scores of 19 and below usually indicate a poorer quality of life in these areas.  A difference of  2-3 points is a clinically meaningful difference.  A difference of 2-3 points in the total score of the Quality of Life Index has been associated with significant improvement in overall quality of life, self-image, physical symptoms, and general health in studies assessing change in quality of life.  PHQ-9: Recent Review Flowsheet Data    Depression screen [facility] 2/9 01/05/2019 04/16/2016 01/22/2013   Decreased Interest 0 0 0   Down, Depressed, Hopeless 0 0 0   PHQ - 2 Score 0 0 0     Interpretation of Total Score  Total Score Depression Severity:  1-4 = Minimal depression, 5-9 = Mild depression, 10-14 = Moderate depression, 15-19 = Moderately severe depression, 20-27 = Severe depression   Psychosocial Evaluation and Intervention: Psychosocial Evaluation - 01/19/19 1402      Psychosocial Evaluation & Interventions   Comments  No psychosocial barriers to participation in CR identified. No interventions needed at this time. Mr. Encarnacion does not have any hobbies. He continues to utilize his strong support system of family and friends. He continues to maintain a positive outlook and attitude and hopes to get back to the gym soon.       Psychosocial Re-Evaluation: Psychosocial Re-Evaluation    [place] Name 01/19/19  1402             Psychosocial Re-Evaluation   Comments  No psychosocial barriers to participation in CR identified. No  interventions needed at this time. Mr. Abraha does not have any hobbies. He continues to utilize his strong support system of family and friends. He continues to maintain a positive outlook and attitude and hopes to get back to the gym soon.       Expected Outcomes  Patient will maintain a positive attitude and outlook. He will continue to utilize his support system if barriers to self health management and cardiac rehab participation occur       Interventions  Encouraged to attend Cardiac Rehabilitation for the exercise       Continue Psychosocial Services   No Follow up required          Psychosocial Discharge (Final Psychosocial Re-Evaluation): Psychosocial Re-Evaluation - 01/19/19 1402      Psychosocial Re-Evaluation   Comments  No psychosocial barriers to participation in CR identified. No interventions needed at this time. Mr. Lamprecht does not have any hobbies. He continues to utilize his strong support system of family and friends. He continues to maintain a positive outlook and attitude and hopes to get back to the gym soon.    Expected Outcomes  Patient will maintain a positive attitude and outlook. He will continue to utilize his support system if barriers to self health management and cardiac rehab participation occur    Interventions  Encouraged to attend Cardiac Rehabilitation for the exercise    Continue Psychosocial Services   No Follow up required       Vocational Rehabilitation: Provide vocational rehab assistance to qualifying candidates.   Vocational Rehab Evaluation & Intervention:   Education: Education Goals: Education classes will be provided on a weekly basis, covering required topics. Participant will state understanding/return demonstration of topics presented.  Learning Barriers/Preferences: Learning Barriers/Preferences - 01/05/19 1632      Learning Barriers/Preferences   Learning Barriers  None    Learning Preferences  Audio;Verbal Instruction        Education Topics: Hypertension, Hypertension Reduction -Define heart disease and high blood pressure. Discus how high blood pressure affects the body and ways to reduce high blood pressure.   Exercise and Your Heart -Discuss why it is important to exercise, the FITT principles of exercise, normal and abnormal responses to exercise, and how to exercise safely.   Angina -Discuss definition of angina, causes of angina, treatment of angina, and how to decrease risk of having angina.   Cardiac Medications -Review what the following cardiac medications are used for, how they affect the body, and side effects that may occur when taking the medications.  Medications include Aspirin, Beta blockers, calcium channel blockers, ACE Inhibitors, angiotensin receptor blockers, diuretics, digoxin, and antihyperlipidemics.   Congestive Heart Failure -Discuss the definition of CHF, how to live with CHF, the signs and symptoms of CHF, and how keep track of weight and sodium intake.   Heart Disease and Intimacy -Discus the effect sexual activity has on the heart, how changes occur during intimacy as we age, and safety during sexual activity.   Smoking Cessation / COPD -Discuss different methods to quit smoking, the health benefits of quitting smoking, and the definition of COPD.   Nutrition I: Fats -Discuss the types of cholesterol, what cholesterol does to the heart, and how cholesterol levels can be controlled.   Nutrition II: Labels -Discuss the different components of food labels and  how to read food label   Heart Parts/Heart Disease and PAD -Discuss the anatomy of the heart, the pathway of blood circulation through the heart, and these are affected by heart disease.   Stress I: Signs and Symptoms -Discuss the causes of stress, how stress may lead to anxiety and depression, and ways to limit stress.   Stress II: Relaxation -Discuss different types of relaxation techniques to limit  stress.   Warning Signs of Stroke / TIA -Discuss definition of a stroke, what the signs and symptoms are of a stroke, and how to identify when someone is having stroke.   Knowledge Questionnaire Score: Knowledge Questionnaire Score - 01/05/19 1624      Knowledge Questionnaire Score   Pre Score  12/24       Core Components/Risk Factors/Patient Goals at Admission: Personal Goals and Risk Factors at Admission - 01/05/19 1624      Core Components/Risk Factors/Patient Goals on Admission   Hypertension  Yes    Intervention  Provide education on lifestyle modifcations including regular physical activity/exercise, weight management, moderate sodium restriction and increased consumption of fresh fruit, vegetables, and low fat dairy, alcohol moderation, and smoking cessation.;Monitor prescription use compliance.    Expected Outcomes  Short Term: Continued assessment and intervention until BP is < 140/32mm HG in hypertensive participants. < 130/30mm HG in hypertensive participants with diabetes, heart failure or chronic kidney disease.;Long Term: Maintenance of blood pressure at goal levels.    Lipids  Yes    Intervention  Provide education and support for participant on nutrition & aerobic/resistive exercise along with prescribed medications to achieve LDL 70mg , HDL >40mg .    Expected Outcomes  Short Term: Participant states understanding of desired cholesterol values and is compliant with medications prescribed. Participant is following exercise prescription and nutrition guidelines.;Long Term: Cholesterol controlled with medications as prescribed, with individualized exercise RX and with personalized nutrition plan. Value goals: LDL < 70mg , HDL > 40 mg.       Core Components/Risk Factors/Patient Goals Review:  Goals and Risk Factor Review    Row Name 01/13/19 0905 01/19/19 1403           Core Components/Risk Factors/Patient Goals Review   Personal Goals Review  Hypertension;Lipids   Hypertension;Lipids      Review  Jasen started his first day of exercise without difficulty  Mr. Pracht has 2 CAD risk factors. He is eager to continue to participate in CR for modification. His VS remain stable. Denies complaints. Continues to take medications as prescribed and adheres to all medical appointments.      Expected Outcomes  Patient will continue to partcipate in phase 2 cardiac rehab for exercise nutrition and lifestyle modifications  Patient will continue to partcipate in phase 2 cardiac rehab for exercise nutrition and lifestyle modifications         Core Components/Risk Factors/Patient Goals at Discharge (Final Review):  Goals and Risk Factor Review - 01/19/19 1403      Core Components/Risk Factors/Patient Goals Review   Personal Goals Review  Hypertension;Lipids    Review  Mr. Samson has 2 CAD risk factors. He is eager to continue to participate in CR for modification. His VS remain stable. Denies complaints. Continues to take medications as prescribed and adheres to all medical appointments.    Expected Outcomes  Patient will continue to partcipate in phase 2 cardiac rehab for exercise nutrition and lifestyle modifications       ITP Comments: ITP Comments    Row Name 01/05/19  1410 01/19/19 1325 01/19/19 1355       ITP Comments  Medical Director- Dr. Fransico Him, MD  30 Day ITP Review. Patient started his first day of exercise on 01/11/19 and exercised without difficulty  30 day ITP Review: Patient is doing well in CR. His HR is improved since his beta blocker has been decreased. VSS. He denies complaints during exercise. He is tolerating small workload increases.        Comments: see ITP comments

## 2019-01-22 ENCOUNTER — Encounter: Payer: Self-pay | Admitting: Family Medicine

## 2019-01-22 ENCOUNTER — Encounter (HOSPITAL_COMMUNITY): Payer: PPO

## 2019-01-22 ENCOUNTER — Ambulatory Visit (INDEPENDENT_AMBULATORY_CARE_PROVIDER_SITE_OTHER): Payer: PPO | Admitting: Family Medicine

## 2019-01-22 ENCOUNTER — Other Ambulatory Visit: Payer: Self-pay

## 2019-01-22 VITALS — BP 143/79 | HR 46 | Temp 97.8°F | Ht 68.5 in | Wt 156.2 lb

## 2019-01-22 DIAGNOSIS — I10 Essential (primary) hypertension: Secondary | ICD-10-CM | POA: Diagnosis not present

## 2019-01-22 DIAGNOSIS — L570 Actinic keratosis: Secondary | ICD-10-CM

## 2019-01-22 DIAGNOSIS — Z951 Presence of aortocoronary bypass graft: Secondary | ICD-10-CM

## 2019-01-22 DIAGNOSIS — I25119 Atherosclerotic heart disease of native coronary artery with unspecified angina pectoris: Secondary | ICD-10-CM

## 2019-01-22 DIAGNOSIS — I471 Supraventricular tachycardia: Secondary | ICD-10-CM

## 2019-01-22 DIAGNOSIS — N1831 Chronic kidney disease, stage 3a: Secondary | ICD-10-CM

## 2019-01-22 NOTE — Patient Instructions (Signed)
It was very nice to see you today!  We froze a spot on your neck today.  No other changes today.  Please come back when you are ready to have the cyst removed off of your back.  I would like to see you back in 1 year for your next annual checkup, or sooner if needed.  Take care, Dr Jerline Pain  Please try these tips to maintain a healthy lifestyle:   Eat at least 3 REAL meals and 1-2 snacks per day.  Aim for no more than 5 hours between eating.  If you eat breakfast, please do so within one hour of getting up.    Each meal should contain half fruits/vegetables, one quarter protein, and one quarter carbs (no bigger than a computer mouse)   Cut down on sweet beverages. This includes juice, soda, and sweet tea.     Drink at least 1 glass of water with each meal and aim for at least 8 glasses per day   Exercise at least 150 minutes every week.

## 2019-01-22 NOTE — Assessment & Plan Note (Signed)
Chronic problem.  Stable.  Continue metoprolol tartrate 12.5 mg twice daily.

## 2019-01-22 NOTE — Assessment & Plan Note (Signed)
Stable.  Continue management per cardiology. 

## 2019-01-22 NOTE — Progress Notes (Signed)
Chief Complaint:  Jeff Graves is a 73 y.o. male who presents today with a chief complaint of hypertension and to establish care.   Assessment/Plan:  Essential hypertension Chronic problem.  Stable.  Continue metoprolol tartrate 12.5 mg twice daily.  CKD (chronic kidney disease), stage III Check treatment with next blood draw.  Avoid nephrotoxic medications.  Coronary artery disease involving native coronary artery of native heart with angina pectoris (HCC) Stable.  Continue management per cardiology.  Atrial tachycardia Stable.  Continue management per cardiology.  Actinic Keratosis Cryotherapy applied today. He tolerated well. See below procedure note.     Subjective:  HPI:  His stable, chronic medical conditions are outlined below:  # Essential Hypertension  - On metoprolol tartrate 12.5mg  twice daily and tolerating well - ROS: No reported chest pain or shortness of breath  # CAD s/p CABG x4 2020 / Dyslipidemia / Atrial Tachycardia - Follows with cardiology - Dr Caryl Comes  - On aspirin 325mg  daily and atorvastatin 80mg  daily - On amiodarone 200mg  daily - On metoprolol tartrate 12.5mg  twice daily - ROS: No reported chest pain or shortness of breath  # CKD Stage 3  ROS: Per HPI, otherwise a complete review of systems was negative.   PMH:  The following were reviewed and entered/updated in epic: Past Medical History:  Diagnosis Date  . Allergy    seasonal  . Atrial tachycardia ([place]) 06/04/2012   12/13-QRS duration-102 ms 6/14-QRS duration-98 ms   . DDD (degenerative disc disease)   . Diverticulosis of colon   . Diverticulosis of colon without hemorrhage 01/22/2013  . Hemorrhoids   . Hypercholesterolemia   . Hypertension   . PAC (premature atrial contraction)   . Renal calculus   . RENAL CALCULUS, HX OF 12/26/2006   Qualifier: Diagnosis of  By: June Leap    . S/P rotator cuff surgery 04/15/2014  . Shoulder pain   . Sinus bradycardia    Patient Active  Problem List   Diagnosis Date Noted  . S/P CABG x 4 11/24/2018  . CKD (chronic kidney disease), stage III 11/20/2018  . Coronary artery disease involving native coronary artery of native heart with angina pectoris ([place])   . Cervical disc disorder with radiculopathy of cervical region 01/22/2013  . Atrial tachycardia 06/04/2012  . Essential hypertension 12/26/2006   Past Surgical History:  Procedure Laterality Date  . COLONOSCOPY    . CORONARY ARTERY BYPASS GRAFT N/A 11/23/2018   Procedure: CORONARY ARTERY BYPASS GRAFTING (CABG), ON PUMP, TIMES FOUR, LIMA to LAD, SEQ SVG to PDA, PLVB, SVG to CIRCUMFLEX, USING LEFT INTERNAL MAMMARY ARTERY AND RIGHT GREATER SAPHENOUS VEIN HARVESTED ENDOSCOPICALLY AND LEFT GREATER SAPHENOUS VEIN;  Surgeon: [name], MD;  Location: [facility];  Service: Open Heart Surgery;  Laterality: N/A;  . LEFT HEART CATH AND CORONARY ANGIOGRAPHY N/A 11/18/2018   Procedure: LEFT HEART CATH AND CORONARY ANGIOGRAPHY;  Surgeon: Burnell Blanks, MD;  Location: [facility];  Service: Cardiovascular;  Laterality: N/A;  . None    . SHOULDER SURGERY Left 03/29/2014  . TEE WITHOUT CARDIOVERSION N/A 11/23/2018   Procedure: TRANSESOPHAGEAL ECHOCARDIOGRAM (TEE);  Surgeon: [name], MD;  Location: [facility];  Service: Open Heart Surgery;  Laterality: N/A;    Family History  Problem Relation Age of Onset  . Heart attack Father 74  . Valvular heart disease Mother 13  . Lymphoma Sister        Deceased  . Breast cancer Sister  Alive  . Colon cancer Neg Hx   . Esophageal cancer Neg Hx   . Rectal cancer Neg Hx   . Stomach cancer Neg Hx     Medications- reviewed and updated Current Outpatient Medications  Medication Sig Dispense Refill  . amiodarone (PACERONE) 200 MG tablet Take 200 mg by mouth daily.    [name] aspirin EC 325 MG EC tablet Take 1 tablet (325 mg total) by mouth daily. 30 tablet 0  . atorvastatin (LIPITOR) 80 MG tablet Take 1 tablet (80 mg  total) by mouth daily at 6 PM. 30 tablet 3  . Cholecalciferol (VITAMIN D) 2000 units CAPS Take 2,000 Units by mouth daily.    . Cyanocobalamin (VITAMIN B-12) 5000 MCG LOZG Take 5,000 mcg by mouth daily.     . folic acid (FOLVITE) 1 MG tablet Take 1 tablet (1 mg total) by mouth daily. 30 tablet 0  . loratadine (CLARITIN) 10 MG tablet Take 10 mg by mouth daily as needed for allergies.     . metoprolol tartrate (LOPRESSOR) 25 MG tablet Take 0.5 tablets (12.5 mg total) by mouth 2 (two) times daily. 90 tablet 3  . Multiple Vitamins-Minerals (MULTIVITAMIN WITH MINERALS) tablet Take 1 tablet by mouth daily.     No current facility-administered medications for this visit.    Allergies-reviewed and updated No Known Allergies  Social History   Socioeconomic History  . Marital status: Married    Spouse name: brenda  . Number of children: 1  . Years of education: 27  . Highest education level: High school graduate  Occupational History  . Occupation: Chief of Staff: [employer]  Tobacco Use  . Smoking status: Former Smoker    Packs/day: 0.50    Years: 20.00    Pack years: 10.00    Types: Cigarettes    Quit date: 02/12/1984    Years since quitting: 34.9  . Smokeless tobacco: Never Used  Substance and Sexual Activity  . Alcohol use: No    Alcohol/week: 0.0 standard drinks    Comment: social use  . Drug use: No  . Sexual activity: Not on file  Other Topics Concern  . Not on file  Social History Narrative  . Not on file   Social Determinants of Health   Financial Resource Strain: Low Risk   . Difficulty of Paying Living Expenses: Not hard at all  Food Insecurity: No Food Insecurity  . Worried About Charity fundraiser in the Last Year: Never true  . Ran Out of Food in the Last Year: Never true  Transportation Needs: No Transportation Needs  . Lack of Transportation (Medical): No  . Lack of Transportation (Non-Medical): No  Physical Activity:  Insufficiently Active  . Days of Exercise per Week: 3 days  . Minutes of Exercise per Session: 30 min  Stress: No Stress Concern Present  . Feeling of Stress : Not at all  Social Connections: Slightly Isolated  . Frequency of Communication with Friends and Family: Twice a week  . Frequency of Social Gatherings with Friends and Family: Once a week  . Attends Religious Services: More than 4 times per year  . Active Member of Clubs or Organizations: No  . Attends Archivist Meetings: Never  . Marital Status: Married        Objective:  Physical Exam: BP (!) 143/79   Pulse (!) 46   Temp 97.8 F (36.6 C)   Ht 5' 8.5" (1.74  m)   Wt 156 lb 3.2 oz (70.9 kg)   SpO2 95%   BMI 23.40 kg/m   Gen: NAD, resting comfortably CV: Regular rate and rhythm with no murmurs appreciated Pulm: Normal work of breathing, clear to auscultation bilaterally with no crackles, wheezes, or rhonchi GI: Normal bowel sounds present. Soft, Nontender, Nondistended. MSK: No edema, cyanosis, or clubbing noted Skin: Warm, dry.  Sebaceous cyst noted on mid back.  Hyperkeratotic lesion noted on left neck. Neuro: Grossly normal, moves all extremities Psych: Normal affect and thought content  Cryotherapy Procedure Note  Pre-operative Diagnosis: Actinic keratosis  Locations: Left Neck  Indications: Therapeutic  Procedure Details  Patient informed of risks (permanent scarring, infection, light or dark discoloration, bleeding, infection, weakness, numbness and recurrence of the lesion) and benefits of the procedure and verbal informed consent obtained.  The areas are treated with liquid nitrogen therapy, frozen until ice ball extended 2 mm beyond lesion, allowed to thaw, and treated again. The patient tolerated procedure well.  The patient was instructed on post-op care, warned that there may be blister formation, redness and pain. Recommend OTC analgesia as needed for  pain.  Condition: Stable  Complications: none.        Algis Greenhouse. Jerline Pain, MD 01/22/2019 3:31 PM

## 2019-01-22 NOTE — Assessment & Plan Note (Signed)
Check treatment with next blood draw.  Avoid nephrotoxic medications.

## 2019-01-25 ENCOUNTER — Encounter (HOSPITAL_COMMUNITY)
Admission: RE | Admit: 2019-01-25 | Discharge: 2019-01-25 | Disposition: A | Discharge status: Home / Self Care | Payer: PPO | Source: Ambulatory Visit | Attending: Cardiovascular Disease | Admitting: Cardiovascular Disease

## 2019-01-25 ENCOUNTER — Other Ambulatory Visit: Payer: Self-pay

## 2019-01-25 ENCOUNTER — Encounter (HOSPITAL_COMMUNITY): Payer: PPO

## 2019-01-25 DIAGNOSIS — Z951 Presence of aortocoronary bypass graft: Secondary | ICD-10-CM | POA: Diagnosis not present

## 2019-01-26 NOTE — Progress Notes (Signed)
Reviewed home exercise guidelines with patient including endpoints, temperature precautions, target heart rate and rate of perceived exertion. [facility]. Pt was previously exercising 30 minutes, 3 days/week using the treadmill and stepper and will resume those modalities [facility]. Pt voices understanding of instructions given.  Sol Passer, MS, ACSM CEP

## 2019-01-27 ENCOUNTER — Other Ambulatory Visit: Payer: Self-pay

## 2019-01-27 ENCOUNTER — Encounter (HOSPITAL_COMMUNITY): Payer: PPO

## 2019-01-27 ENCOUNTER — Encounter (HOSPITAL_COMMUNITY)
Admission: RE | Admit: 2019-01-27 | Discharge: 2019-01-27 | Disposition: A | Discharge status: Home / Self Care | Payer: PPO | Source: Ambulatory Visit | Attending: Cardiovascular Disease | Admitting: Cardiovascular Disease

## 2019-01-27 DIAGNOSIS — Z951 Presence of aortocoronary bypass graft: Secondary | ICD-10-CM | POA: Diagnosis not present

## 2019-01-28 ENCOUNTER — Ambulatory Visit: Payer: PPO | Admitting: Cardiovascular Disease

## 2019-01-28 ENCOUNTER — Encounter: Payer: Self-pay | Admitting: Cardiovascular Disease

## 2019-01-28 VITALS — BP 148/84 | HR 47 | Ht 68.5 in | Wt 155.0 lb

## 2019-01-28 DIAGNOSIS — I4891 Unspecified atrial fibrillation: Secondary | ICD-10-CM

## 2019-01-28 DIAGNOSIS — I25119 Atherosclerotic heart disease of native coronary artery with unspecified angina pectoris: Secondary | ICD-10-CM

## 2019-01-28 DIAGNOSIS — E7800 Pure hypercholesterolemia, unspecified: Secondary | ICD-10-CM

## 2019-01-28 NOTE — Progress Notes (Signed)
Chief Complaint  Patient presents with  . Follow-up    CAD    History of Present Illness: 73 yo male with history of HLD, HTN, atrial tachycardia and CAD s/p CABG October 2020 who is here today for cardiac follow up. He was admitted to [facility] October 2020 with unstable angina. Cardiac cath October 2020 with severe three vessel CAD. He underwent 4V CABG on 11/23/18 (LIMA to LAD, SVG to Circumflex, SVG sequential to PDA and PLA). He had a postoperative pneumothorax and post-operative atrial fibrillation. He converted to sinus quickly on amiodarone.   He is here today for follow up. The patient denies any chest pain, dyspnea, palpitations, lower extremity edema, orthopnea, PND, dizziness, near syncope or syncope.   Primary Care Physician: Vivi Barrack, MD  Past Medical History:  Diagnosis Date  . Allergy    seasonal  . Atrial tachycardia ([place]) 06/04/2012   12/13-QRS duration-102 ms 6/14-QRS duration-98 ms   . DDD (degenerative disc disease)   . Diverticulosis of colon   . Diverticulosis of colon without hemorrhage 01/22/2013  . Hemorrhoids   . Hypercholesterolemia   . Hypertension   . PAC (premature atrial contraction)   . Renal calculus   . RENAL CALCULUS, HX OF 12/26/2006   Qualifier: Diagnosis of  By: June Leap    . S/P rotator cuff surgery 04/15/2014  . Shoulder pain   . Sinus bradycardia     Past Surgical History:  Procedure Laterality Date  . COLONOSCOPY    . CORONARY ARTERY BYPASS GRAFT N/A 11/23/2018   Procedure: CORONARY ARTERY BYPASS GRAFTING (CABG), ON PUMP, TIMES FOUR, LIMA to LAD, SEQ SVG to PDA, PLVB, SVG to CIRCUMFLEX, USING LEFT INTERNAL MAMMARY ARTERY AND RIGHT GREATER SAPHENOUS VEIN HARVESTED ENDOSCOPICALLY AND LEFT GREATER SAPHENOUS VEIN;  Surgeon: [name], MD;  Location: [facility];  Service: Open Heart Surgery;  Laterality: N/A;  . LEFT HEART CATH AND CORONARY ANGIOGRAPHY N/A 11/18/2018   Procedure: LEFT HEART CATH AND CORONARY ANGIOGRAPHY;  Surgeon:  Burnell Blanks, MD;  Location: [facility];  Service: Cardiovascular;  Laterality: N/A;  . None    . SHOULDER SURGERY Left 03/29/2014  . TEE WITHOUT CARDIOVERSION N/A 11/23/2018   Procedure: TRANSESOPHAGEAL ECHOCARDIOGRAM (TEE);  Surgeon: [name], MD;  Location: [facility];  Service: Open Heart Surgery;  Laterality: N/A;    Current Outpatient Medications  Medication Sig Dispense Refill  . aspirin EC 325 MG EC tablet Take 1 tablet (325 mg total) by mouth daily. 30 tablet 0  . atorvastatin (LIPITOR) 80 MG tablet Take 1 tablet (80 mg total) by mouth daily at 6 PM. 30 tablet 3  . Cholecalciferol (VITAMIN D) 2000 units CAPS Take 2,000 Units by mouth daily.    . Cyanocobalamin (VITAMIN B-12) 5000 MCG LOZG Take 5,000 mcg by mouth daily.     . folic acid (FOLVITE) 1 MG tablet Take 1 tablet (1 mg total) by mouth daily. 30 tablet 0  . loratadine (CLARITIN) 10 MG tablet Take 10 mg by mouth daily as needed for allergies.     . metoprolol tartrate (LOPRESSOR) 25 MG tablet Take 0.5 tablets (12.5 mg total) by mouth 2 (two) times daily. 90 tablet 3  . Multiple Vitamins-Minerals (MULTIVITAMIN WITH MINERALS) tablet Take 1 tablet by mouth daily.     No current facility-administered medications for this visit.    No Known Allergies  Social History   Socioeconomic History  . Marital status: Married    Spouse  name: brenda  . Number of children: 1  . Years of education: 60  . Highest education level: High school graduate  Occupational History  . Occupation: Chief of Staff: [employer]  Tobacco Use  . Smoking status: Former Smoker    Packs/day: 0.50    Years: 20.00    Pack years: 10.00    Types: Cigarettes    Quit date: 02/12/1984    Years since quitting: 34.9  . Smokeless tobacco: Never Used  Substance and Sexual Activity  . Alcohol use: No    Alcohol/week: 0.0 standard drinks    Comment: social use  . Drug use: No  . Sexual activity: Not on file   Other Topics Concern  . Not on file  Social History Narrative  . Not on file   Social Determinants of Health   Financial Resource Strain: Low Risk   . Difficulty of Paying Living Expenses: Not hard at all  Food Insecurity: No Food Insecurity  . Worried About Charity fundraiser in the Last Year: Never true  . Ran Out of Food in the Last Year: Never true  Transportation Needs: No Transportation Needs  . Lack of Transportation (Medical): No  . Lack of Transportation (Non-Medical): No  Physical Activity: Insufficiently Active  . Days of Exercise per Week: 3 days  . Minutes of Exercise per Session: 30 min  Stress: No Stress Concern Present  . Feeling of Stress : Not at all  Social Connections: Slightly Isolated  . Frequency of Communication with Friends and Family: Twice a week  . Frequency of Social Gatherings with Friends and Family: Once a week  . Attends Religious Services: More than 4 times per year  . Active Member of Clubs or Organizations: No  . Attends Archivist Meetings: Never  . Marital Status: Married  Human resources officer Violence: Unknown  . Fear of Current or Ex-Partner: Patient refused  . Emotionally Abused: Patient refused  . Physically Abused: Patient refused  . Sexually Abused: Patient refused    Family History  Problem Relation Age of Onset  . Heart attack Father 41  . Valvular heart disease Mother 52  . Lymphoma Sister        Deceased  . Breast cancer Sister        Alive  . Colon cancer Neg Hx   . Esophageal cancer Neg Hx   . Rectal cancer Neg Hx   . Stomach cancer Neg Hx     Review of Systems:  As stated in the HPI and otherwise negative.   BP (!) 148/84   Pulse (!) 47   Ht 5' 8.5" (1.74 m)   Wt 155 lb (70.3 kg)   SpO2 97%   BMI 23.22 kg/m   Physical Examination: General: Well developed, well nourished, NAD  HEENT: OP clear, mucus membranes moist  SKIN: warm, dry. No rashes. Neuro: No focal deficits  Musculoskeletal: Muscle  strength 5/5 all ext  Psychiatric: Mood and affect normal  Neck: No JVD, no carotid bruits, no thyromegaly, no lymphadenopathy.  Lungs:Clear bilaterally, no wheezes, rhonci, crackles Cardiovascular: Regular rate and rhythm. No murmurs, gallops or rubs. Abdomen:Soft. Bowel sounds present. Non-tender.  Extremities: No lower extremity edema. Pulses are 2 + in the bilateral DP/PT.  EKG:  EKG is ordered today. The ekg ordered today demonstrates sinus brady, rate 47 bpm  Recent Labs: 11/20/2018: ALT 16 11/24/2018: Magnesium 3.0 11/28/2018: BUN 30; Creatinine, Ser 1.26; Hemoglobin 7.7; Platelets  257; Potassium 4.5; Sodium 134   Lipid Panel    Component Value Date/Time   CHOL 145 11/20/2018 0519   TRIG 18 11/20/2018 0519   HDL 72 11/20/2018 0519   CHOLHDL 2.0 11/20/2018 0519   VLDL 4 11/20/2018 0519   LDLCALC 69 11/20/2018 0519     Wt Readings from Last 3 Encounters:  01/28/19 155 lb (70.3 kg)  01/22/19 156 lb 3.2 oz (70.9 kg)  01/05/19 152 lb 12.5 oz (69.3 kg)     Other studies Reviewed: Additional studies/ records that were reviewed today include:  Review of the above records demonstrates:    Assessment and Plan:   1. CAD s/p CABG without angina: Doing well. No chest pain. Continue ASA, statin and beta blocker.   2.Post-operative atrial fibrillation: No recurrence. No palpitations. Will stop amiodarone. Continue ASA and beta blocker.   3. Hyperlipidemia: Will check lipid and LFTs now. Continue statin.   Current medicines are reviewed at length with the patient today.  The patient does not have concerns regarding medicines.  The following changes have been made:  no change  Labs/ tests ordered today include:   Orders Placed This Encounter  Procedures  . Hepatic function panel  . Lipid Profile     Disposition:   FU with me in six months year.    Signed, Lauree Chandler, MD 01/28/2019 3:58 PM    [facility] [address],  [place], [place]  [address] Phone: [phone]; Fax: [phone]

## 2019-01-28 NOTE — Patient Instructions (Signed)
Medication Instructions:  Your physician has recommended you make the following change in your medication:  1.) stop amiodarone  *If you need a refill on your cardiac medications before your next appointment, please call your pharmacy*  Lab Work: Please return for fasting lipid and liver function panel If you have labs (blood work) drawn today and your tests are completely normal, you will receive your results only by: [name] MyChart Message (if you have MyChart) OR . A paper copy in the mail If you have any lab test that is abnormal or we need to change your treatment, we will call you to review the results.  Testing/Procedures: none  Follow-Up: At [facility], you and your health needs are our priority.  As part of our continuing mission to provide you with exceptional heart care, we have created designated Provider Care Teams.  These Care Teams include your primary Cardiologist (physician) and Advanced Practice Providers (APPs -  Physician Assistants and Nurse Practitioners) who all work together to provide you with the care you need, when you need it.  Your next appointment:   6 month(s)  The format for your next appointment:   Either In Person or Virtual  Provider:   You may see Lauree Chandler, MD or one of the following Advanced Practice Providers on your designated Care Team:    Melina Copa, PA-C  Ermalinda Barrios, PA-C   Other Instructions

## 2019-01-29 ENCOUNTER — Other Ambulatory Visit: Payer: Self-pay

## 2019-01-29 ENCOUNTER — Encounter (HOSPITAL_COMMUNITY): Payer: PPO

## 2019-01-29 ENCOUNTER — Encounter (HOSPITAL_COMMUNITY)
Admission: RE | Admit: 2019-01-29 | Discharge: 2019-01-29 | Disposition: A | Discharge status: Home / Self Care | Payer: PPO | Source: Ambulatory Visit | Attending: Cardiovascular Disease | Admitting: Cardiovascular Disease

## 2019-01-29 DIAGNOSIS — Z951 Presence of aortocoronary bypass graft: Secondary | ICD-10-CM

## 2019-01-29 NOTE — Addendum Note (Signed)
Addended by: Rose Phi on: 01/29/2019 03:15 PM   Modules accepted: Orders

## 2019-02-01 ENCOUNTER — Encounter (HOSPITAL_COMMUNITY)
Admission: RE | Admit: 2019-02-01 | Discharge: 2019-02-01 | Disposition: A | Discharge status: Home / Self Care | Payer: PPO | Source: Ambulatory Visit | Attending: Cardiovascular Disease | Admitting: Cardiovascular Disease

## 2019-02-01 ENCOUNTER — Encounter (HOSPITAL_COMMUNITY): Payer: PPO

## 2019-02-01 ENCOUNTER — Other Ambulatory Visit: Payer: Self-pay

## 2019-02-01 DIAGNOSIS — Z951 Presence of aortocoronary bypass graft: Secondary | ICD-10-CM

## 2019-02-02 NOTE — Progress Notes (Signed)
Nutrition Education Note  Spoke with pt about goals we set at initial assessment. Pt had planned to try the grilled chicfila sandwich and try half/half sweet tea. Pt has completely eliminated sweet tea and is choosing the grilled chicken sandwich every time he eats out. Pt meeting goals and feels successful. He feels this has not changed his QOL and was very simple. Desires to continue setting goals. Reviewed Food Label Education. Provided examples and handouts for reinforcement. Pt verbalized understanding. Will Continue to monitor and follow up with patient.   Michaele Offer, MS, RDN, LDN

## 2019-02-03 ENCOUNTER — Encounter (HOSPITAL_COMMUNITY): Payer: PPO

## 2019-02-03 ENCOUNTER — Encounter (HOSPITAL_COMMUNITY)
Admission: RE | Admit: 2019-02-03 | Discharge: 2019-02-03 | Disposition: A | Discharge status: Home / Self Care | Payer: PPO | Source: Ambulatory Visit | Attending: Cardiovascular Disease | Admitting: Cardiovascular Disease

## 2019-02-03 ENCOUNTER — Other Ambulatory Visit: Payer: PPO | Admitting: *Deleted

## 2019-02-03 ENCOUNTER — Other Ambulatory Visit: Payer: Self-pay

## 2019-02-03 DIAGNOSIS — E7800 Pure hypercholesterolemia, unspecified: Secondary | ICD-10-CM | POA: Diagnosis not present

## 2019-02-03 DIAGNOSIS — Z951 Presence of aortocoronary bypass graft: Secondary | ICD-10-CM | POA: Diagnosis not present

## 2019-02-03 DIAGNOSIS — I4891 Unspecified atrial fibrillation: Secondary | ICD-10-CM

## 2019-02-03 DIAGNOSIS — I25119 Atherosclerotic heart disease of native coronary artery with unspecified angina pectoris: Secondary | ICD-10-CM | POA: Diagnosis not present

## 2019-02-04 LAB — HEPATIC FUNCTION PANEL
ALT: 47 IU/L — ABNORMAL HIGH (ref 0–44)
AST: 33 IU/L (ref 0–40)
Albumin: 3.8 g/dL (ref 3.7–4.7)
Alkaline Phosphatase: 87 IU/L (ref 39–117)
Bilirubin Total: 0.6 mg/dL (ref 0.0–1.2)
Bilirubin, Direct: 0.23 mg/dL (ref 0.00–0.40)
Total Protein: 6.1 g/dL (ref 6.0–8.5)

## 2019-02-04 LAB — LIPID PANEL
Chol/HDL Ratio: 1.6 ratio (ref 0.0–5.0)
Cholesterol, Total: 129 mg/dL (ref 100–199)
HDL: 83 mg/dL (ref >39)
LDL Chol Calc (NIH): 37 mg/dL (ref 0–99)
Triglycerides: 31 mg/dL (ref 0–149)
VLDL Cholesterol Cal: 9 mg/dL (ref 5–40)

## 2019-02-08 ENCOUNTER — Encounter (HOSPITAL_COMMUNITY)
Admission: RE | Admit: 2019-02-08 | Discharge: 2019-02-08 | Disposition: A | Discharge status: Home / Self Care | Payer: PPO | Source: Ambulatory Visit | Attending: Cardiovascular Disease | Admitting: Cardiovascular Disease

## 2019-02-08 ENCOUNTER — Encounter (HOSPITAL_COMMUNITY): Payer: PPO

## 2019-02-08 ENCOUNTER — Other Ambulatory Visit: Payer: Self-pay

## 2019-02-08 VITALS — BP 110/66 | HR 59 | Temp 98.1°F | Ht 68.5 in | Wt 155.2 lb

## 2019-02-08 DIAGNOSIS — Z951 Presence of aortocoronary bypass graft: Secondary | ICD-10-CM

## 2019-02-08 NOTE — Progress Notes (Signed)
Discharge Progress Report  Patient Details  Name: Jeff Graves MRN: [id number] Date of Birth: 01-29-1946 Referring Provider:     Lazy Graves from 01/05/2019 in [place]  Referring Provider  Jeff Jefferson, MD       Number of Visits: 11  Reason for Discharge:  Patient reached a stable level of exercise. Patient independent in their exercise. Patient has met program and personal goals.  Smoking History:  Social History   Tobacco Use  Smoking Status Former Smoker  . Packs/day: 0.50  . Years: 20.00  . Pack years: 10.00  . Types: Cigarettes  . Quit date: 02/12/1984  . Years since quitting: 35.0  Smokeless Tobacco Never Used    Diagnosis:  11/23/18 CABG x4  ADL UCSD:   Initial Exercise Prescription: Initial Exercise Prescription - 01/05/19 1600      Date of Initial Exercise RX and Referring Provider   Date  01/05/19    Referring Provider  Jeff Jefferson, MD    Expected Discharge Date  03/05/19      Treadmill   MPH  2.3    Grade  0    Minutes  15    METs  2.76      NuStep   Level  2    SPM  85    Minutes  15    METs  2.5      Prescription Details   Frequency (times per week)  3    Duration  Progress to 30 minutes of continuous aerobic without signs/symptoms of physical distress      Intensity   THRR 40-80% of Max Heartrate  60-119    Ratings of Perceived Exertion  11-13    Perceived Dyspnea  0-4      Progression   Progression  Continue to progress workloads to maintain intensity without signs/symptoms of physical distress.      Resistance Training   Training Prescription  Yes    Weight  3lbs    Reps  10-15       Discharge Exercise Prescription (Final Exercise Prescription Changes): Exercise Prescription Changes - 02/08/19 1513      Response to Exercise   Blood Pressure (Admit)  110/66    Blood Pressure (Exercise)  122/82    Blood Pressure (Exit)  108/60    Heart Rate  (Admit)  59 bpm    Heart Rate (Exercise)  105 bpm    Heart Rate (Exit)  64 bpm    Rating of Perceived Exertion (Exercise)  12    Symptoms  none    Duration  Progress to 30 minutes of  aerobic without signs/symptoms of physical distress    Intensity  THRR unchanged      Progression   Progression  Continue to progress workloads to maintain intensity without signs/symptoms of physical distress.    Average METs  4.1      Resistance Training   Training Prescription  Yes    Weight  4lbs    Reps  10-15    Time  10 Minutes      Interval Training   Interval Training  No      Treadmill   MPH  3.3    Grade  2    Minutes  15    METs  4.43      NuStep   Level  4    SPM  85    Minutes  10    METs  3.7      Home Exercise Plan   Plans to continue exercise at  [facility] (comment)    Frequency  Add 3 additional days to program exercise sessions.    Initial Home Exercises Provided  01/25/19       Functional Capacity: 6 Minute Walk    Row Name 01/05/19 1425 02/08/19 1513       6 Minute Walk   Phase  Initial  Discharge    Distance  1355 feet  1449 feet    Distance % Change  --  6.94 %    Distance Feet Change  --  94 ft    Walk Time  6 minutes  6 minutes    # of Rest Breaks  0  0    MPH  2.57  2.74    METS  2.78  3.33    RPE  9  11    Perceived Dyspnea   0  0    VO2 Peak  9.73  11.66    Symptoms  No  No    Resting HR  46 bpm  59 bpm    Resting BP  104/56  110/66    Resting Oxygen Saturation   100 %  --    Exercise Oxygen Saturation  during 6 min walk  97 %  --    Max Ex. HR  61 bpm  101 bpm    Max Ex. BP  112/62  122/82    2 Minute Post BP  92/62  108/60       Psychological, QOL, Others - Outcomes: PHQ 2/9: Depression screen [facility] 2/9 01/22/2019 01/05/2019 04/16/2016 01/22/2013  Decreased Interest 0 0 0 0  Down, Depressed, Hopeless 0 0 0 0  PHQ - 2 Score 0 0 0 0    Quality of Life: Quality of Life - 02/09/19 0730      Quality of Life   Select  Quality of  Life      Quality of Life Scores   Health/Function Pre  26.27 %    Health/Function Post  29.42 %    Health/Function % Change  11.99 %    Socioeconomic Pre  24.64 %    Socioeconomic Post  28.5 %    Socioeconomic % Change   15.67 %    Psych/Spiritual Pre  29.64 %    Psych/Spiritual Post  28.25 %    Psych/Spiritual % Change  -4.69 %    Family Pre  30 %    Family Post  30 %    Family % Change  0 %    GLOBAL Pre  27.18 %    GLOBAL Post  29.15 %    GLOBAL % Change  7.25 %       Personal Goals: Goals established at orientation with interventions provided to work toward goal. Personal Goals and Risk Factors at Admission - 01/05/19 1624      Core Components/Risk Factors/Patient Goals on Admission   Hypertension  Yes    Intervention  Provide education on lifestyle modifcations including regular physical activity/exercise, weight management, moderate sodium restriction and increased consumption of fresh fruit, vegetables, and low fat dairy, alcohol moderation, and smoking cessation.;Monitor prescription use compliance.    Expected Outcomes  Short Term: Continued assessment and intervention until BP is < 140/63m HG in hypertensive participants. < 130/830mHG in hypertensive participants with diabetes, heart failure or chronic kidney disease.;Long Term: Maintenance of blood pressure at goal levels.  Lipids  Yes    Intervention  Provide education and support for participant on nutrition & aerobic/resistive exercise along with prescribed medications to achieve LDL <50m, HDL >465m    Expected Outcomes  Short Term: Participant states understanding of desired cholesterol values and is compliant with medications prescribed. Participant is following exercise prescription and nutrition guidelines.;Long Term: Cholesterol controlled with medications as prescribed, with individualized exercise RX and with personalized nutrition plan. Value goals: LDL < 7090mHDL > 40 mg.        Personal Goals  Discharge: Goals and Risk Factor Review    Row Name 01/13/19 0905 01/19/19 1403 02/09/19 1135         Core Components/Risk Factors/Patient Goals Review   Personal Goals Review  Hypertension;Lipids  Hypertension;Lipids  --     Review  SteMacaulayarted his first day of exercise without Graves  Jeff Graves 2 CAD risk factors. He is eager to continue to participate in CR for modification. His VS remain stable. Denies complaints. Continues to take medications as prescribed and adheres to all medical appointments.  --     Expected Outcomes  Patient will continue to partcipate in phase 2 cardiac rehab for exercise nutrition and lifestyle modifications  Patient will continue to partcipate in phase 2 cardiac rehab for exercise nutrition and lifestyle modifications  Mr. SumStammerll continue lifestyle modifications to decrease his CAD riskfactors post discharge.        Exercise Goals and Review: Exercise Goals    Row Name 01/05/19 1424             Exercise Goals   Increase Physical Activity  Yes       Intervention  Provide advice, education, support and counseling about physical activity/exercise needs.;Develop an individualized exercise prescription for aerobic and resistive training based on initial evaluation findings, risk stratification, comorbidities and participant's personal goals.       Expected Outcomes  Short Term: Attend rehab on a regular basis to increase amount of physical activity.;Long Term: Exercising regularly at least 3-5 days a week.;Long Term: Add in home exercise to make exercise part of routine and to increase amount of physical activity.       Increase Strength and Stamina  Yes       Intervention  Provide advice, education, support and counseling about physical activity/exercise needs.;Develop an individualized exercise prescription for aerobic and resistive training based on initial evaluation findings, risk stratification, comorbidities and participant's personal  goals.       Expected Outcomes  Short Term: Increase workloads from initial exercise prescription for resistance, speed, and METs.;Short Term: Perform resistance training exercises routinely during rehab and add in resistance training at home;Long Term: Improve cardiorespiratory fitness, muscular endurance and strength as measured by increased METs and functional capacity (6MWT)       Able to understand and use rate of perceived exertion (RPE) scale  Yes       Intervention  Provide education and explanation on how to use RPE scale       Expected Outcomes  Short Term: Able to use RPE daily in rehab to express subjective intensity level;Long Term:  Able to use RPE to guide intensity level when exercising independently       Knowledge and understanding of Target Heart Rate Range (THRR)  Yes       Intervention  Provide education and explanation of THRR including how the numbers were predicted and where they are located for reference  Expected Outcomes  Short Term: Able to state/look up THRR;Long Term: Able to use THRR to govern intensity when exercising independently;Short Term: Able to use daily as guideline for intensity in rehab       Able to check pulse independently  Yes       Intervention  Provide education and demonstration on how to check pulse in carotid and radial arteries.;Review the importance of being able to check your own pulse for safety during independent exercise       Expected Outcomes  Short Term: Able to explain why pulse checking is important during independent exercise;Long Term: Able to check pulse independently and accurately       Understanding of Exercise Prescription  Yes       Intervention  Provide education, explanation, and written materials on patient's individual exercise prescription       Expected Outcomes  Short Term: Able to explain program exercise prescription;Long Term: Able to explain home exercise prescription to exercise independently          Exercise  Goals Re-Evaluation: Exercise Goals Re-Evaluation    Row Name 01/11/19 1610 01/20/19 1557 01/25/19 1529 02/08/19 1618       Exercise Goal Re-Evaluation   Exercise Goals Review  Able to understand and use rate of perceived exertion (RPE) scale;Increase Physical Activity  Able to understand and use rate of perceived exertion (RPE) scale;Increase Physical Activity;Increase Strength and Stamina;Able to check pulse independently  Able to understand and use rate of perceived exertion (RPE) scale;Increase Physical Activity;Increase Strength and Stamina;Able to check pulse independently;Knowledge and understanding of Target Heart Rate Range (THRR);Understanding of Exercise Prescription  Able to understand and use rate of perceived exertion (RPE) scale;Increase Physical Activity;Increase Strength and Stamina;Able to check pulse independently;Knowledge and understanding of Target Heart Rate Range (THRR);Understanding of Exercise Prescription    Comments  Patient able to understand and use RPE scale appropriately.  Patient was previously exercising at the gym using the treadmill and stepper 30 minutes, and his goal is to resume his previous exercise routine. Patient has a HR monitor and is able to check his pulse independently. Reviewed resting heart rate and parameters for cool down and heart rate after cool down with patient. Pt verbalizes understanding.  Reviewed home exercise guidelines with patient including endpoints, temperature precautions, target heart rate and rate of perceived exertion. [facility]. Pt was previously exercising 30 minutes, 3 days/week using the treadmill and stepper and will resume those modalities once [facility]. Pt voices understanding of instructions given.  Patient completed the phase 2 cardiac rehab program and [facility], and plans to  exercise using the treadmill and stepper at least 30 minutes, 5 days/week. Patient was previously using the weight equipment, and I advised him to get clearance from his physician before returning to weight training. Pt will discuss with his surgeon at upcoming appointment. In the meantime, patient will use hand weights as he's been doing at cardiac rehab. Pt's functional capacity increased 7% as measured by 6MWT, and strength increased 25% as measured by grip strength test.    Expected Outcomes  Progress workloads as tolerated to help improve cardiorespiratory fitness.  Increase workloads as tolerated to help build strength and confidence with exercise routine so that patient can resume exercise at gym safely.  Patient will continue exercise  30 minutes at [facility] of [facility] program.  Patient will exericse at least 30 minutes, 5 days/week to maintain health and fitness gains.       Nutrition & Weight - Outcomes: Pre Biometrics - 01/05/19 1423      Pre Biometrics   Height  5' 8.5" (1.74 m)    Weight  69.3 kg    Waist Circumference  35 inches    Hip Circumference  37.5 inches    Waist to Hip Ratio  0.93 %    BMI (Calculated)  22.89    Triceps Skinfold  12 mm    % Body Fat  22.6 %    Grip Strength  27.5 kg    Flexibility  13.75 in    Single Leg Stand  8.5 seconds      Post Biometrics - 02/08/19 1515       Post  Biometrics   Height  5' 8.5" (1.74 m)    Weight  70.4 kg    Waist Circumference  35.75 inches    Hip Circumference  37.75 inches    Waist to Hip Ratio  0.95 %    BMI (Calculated)  23.25    Triceps Skinfold  11 mm    % Body Fat  22.8 %    Grip Strength  34.5 kg    Flexibility  13.25 in    Single Leg Stand  31 seconds       Nutrition: Nutrition Therapy & Goals - 01/19/19 0805      Nutrition Therapy   Diet  Low Sodium/Heart Healthy    Drug/Food Interactions  Statins/Certain Fruits      Personal Nutrition Goals   Nutrition  Goal  Pt to identify and limit food sources of saturated fat, trans fat, refined carbohydrates and sodium    Personal Goal #2  Pt to limit fried foods and sugary beverages with goal of eliminating      Intervention Plan   Intervention  Prescribe, educate and counsel regarding individualized specific dietary modifications aiming towards targeted core components such as weight, hypertension, lipid management, diabetes, heart failure and other comorbidities.;Nutrition handout(s) given to patient.    Expected Outcomes  Short Term Goal: Understand basic principles of dietary content, such as calories, fat, sodium, cholesterol and nutrients.;Short Term Goal: A plan has been developed with personal nutrition goals set during dietitian appointment.;Long Term Goal: Adherence to prescribed nutrition plan.       Nutrition Discharge: Nutrition Assessments - 01/19/19 0808      MEDFICTS Scores   Pre Score  42       Education Questionnaire Score: Knowledge Questionnaire Score - 02/08/19 1618      Knowledge Questionnaire Score   Pre Score  12/24    Post Score  17/24       Pt graduated from cardiac rehab program today with completion of 11 exercise sessions in Phase II. Pt maintained good attendance and progressed nicely during his participation in rehab as evidenced by increased MET level. Medication list reconciled. Repeat  PHQ score-0.  Pt has made significant lifestyle changes and should be commended for his success. Pt feels he has achieved his goals during cardiac rehab.  Goals reviewed with patient; copy given to patient.

## 2019-02-10 ENCOUNTER — Encounter (HOSPITAL_COMMUNITY): Payer: PPO

## 2019-02-15 ENCOUNTER — Encounter (HOSPITAL_COMMUNITY): Payer: PPO

## 2019-02-17 ENCOUNTER — Encounter (HOSPITAL_COMMUNITY): Payer: PPO

## 2019-02-18 ENCOUNTER — Ambulatory Visit (INDEPENDENT_AMBULATORY_CARE_PROVIDER_SITE_OTHER): Payer: Self-pay | Admitting: Cardiothoracic Surgery

## 2019-02-18 ENCOUNTER — Other Ambulatory Visit: Payer: Self-pay

## 2019-02-18 VITALS — Temp 97.5°F | Resp 20 | Ht 69.0 in | Wt 155.0 lb

## 2019-02-18 DIAGNOSIS — Z951 Presence of aortocoronary bypass graft: Secondary | ICD-10-CM

## 2019-02-18 DIAGNOSIS — I2510 Atherosclerotic heart disease of native coronary artery without angina pectoris: Secondary | ICD-10-CM

## 2019-02-18 NOTE — Progress Notes (Signed)
[address]       [place] [address]             [phone]      Faiz L Zenz [facility] Medical Record [id number] Date of Birth: 06/04/1945  Referring: Burnell Blanks* Primary Care: Vivi Barrack, MD Primary Cardiologist: Lauree Chandler, MD   Chief Complaint:   POST OP FOLLOW UP OPERATIVE REPORT DATE OF PROCEDURE:  11/23/2018 PREOPERATIVE DIAGNOSIS:  Coronary occlusive disease with new onset of angina. POSTOPERATIVE DIAGNOSIS:  Coronary occlusive disease with new onset of angina. SURGICAL PROCEDURE:  Coronary artery bypass grafting x4 with the left internal mammary to the left anterior descending coronary artery, reverse saphenous vein graft to the circumflex coronary artery, sequential reverse saphenous vein graft to the  posterior descending and posterolateral branches of the right coronary artery with right thigh and calf endoscopic vein harvesting and open left calf greater saphenous vein harvesting.  History of Present Illness:     Patient doing well following coronary artery bypass grafting, is now returned to work without difficulty.  He is careful about heavy lifting. On his last visit with cardiology his amiodarone was discontinued.    Past Medical History:  Diagnosis Date  . Allergy    seasonal  . Atrial tachycardia ([place]) 06/04/2012   12/13-QRS duration-102 ms 6/14-QRS duration-98 ms   . DDD (degenerative disc disease)   . Diverticulosis of colon   . Diverticulosis of colon without hemorrhage 01/22/2013  . Hemorrhoids   . Hypercholesterolemia   . Hypertension   . PAC (premature atrial contraction)   . Renal calculus   . RENAL CALCULUS, HX OF 12/26/2006   Qualifier: Diagnosis of  By: June Leap    . S/P rotator cuff surgery 04/15/2014  . Shoulder pain   . Sinus bradycardia      Social History   Tobacco Use  Smoking Status Former Smoker  . Packs/day: 0.50  . Years: 20.00  . Pack years: 10.00  . Types:  Cigarettes  . Quit date: 02/12/1984  . Years since quitting: 35.0  Smokeless Tobacco Never Used    Social History   Substance and Sexual Activity  Alcohol Use No  . Alcohol/week: 0.0 standard drinks   Comment: social use     No Known Allergies  Current Outpatient Medications  Medication Sig Dispense Refill  . aspirin EC 325 MG EC tablet Take 1 tablet (325 mg total) by mouth daily. 30 tablet 0  . atorvastatin (LIPITOR) 80 MG tablet Take 1 tablet (80 mg total) by mouth daily at 6 PM. 30 tablet 3  . Cholecalciferol (VITAMIN D) 2000 units CAPS Take 2,000 Units by mouth daily.    . Cyanocobalamin (VITAMIN B-12) 5000 MCG LOZG Take 5,000 mcg by mouth daily.     . folic acid (FOLVITE) 1 MG tablet Take 1 tablet (1 mg total) by mouth daily. 30 tablet 0  . loratadine (CLARITIN) 10 MG tablet Take 10 mg by mouth daily as needed for allergies.     . metoprolol tartrate (LOPRESSOR) 25 MG tablet Take 0.5 tablets (12.5 mg total) by mouth 2 (two) times daily. 90 tablet 3  . Multiple Vitamins-Minerals (MULTIVITAMIN WITH MINERALS) tablet Take 1 tablet by mouth daily.     No current facility-administered medications for this visit.       Physical Exam: Temp (!) 97.5 F (36.4 C) (Skin)   Resp 20   Ht 5\' 9"  (1.753 m)   Wt  70.3 kg   SpO2 97% Comment: RA  BMI 22.89 kg/m   General appearance: alert, cooperative and no distress Neck: no adenopathy, no carotid bruit, no JVD, supple, symmetrical, trachea midline and thyroid not enlarged, symmetric, no tenderness/mass/nodules Resp: clear to auscultation bilaterally Cardio: regular rate and rhythm, S1, S2 normal, no murmur, click, rub or gallop GI: soft, non-tender; bowel sounds normal; no masses,  no organomegaly Extremities: extremities normal, atraumatic, no cyanosis or Homans sign is negative, no sign of DVT, mild pedal edema Neurologic: Grossly normal  Diagnostic Studies & Laboratory data:     Recent Radiology Findings:     Recent Lab  Findings: Lab Results  Component Value Date   WBC 7.9 11/28/2018   HGB 7.7 (L) 11/28/2018   HCT 23.4 (L) 11/28/2018   PLT 257 11/28/2018   GLUCOSE 89 11/28/2018   CHOL 129 02/03/2019   TRIG 31 02/03/2019   HDL 83 02/03/2019   LDLCALC 37 02/03/2019   ALT 47 (H) 02/03/2019   AST 33 02/03/2019   NA 134 (L) 11/28/2018   K 4.5 11/28/2018   CL 106 11/28/2018   CREATININE 1.26 (H) 11/28/2018   BUN 30 (H) 11/28/2018   CO2 22 11/28/2018   TSH 3.22 04/16/2017   INR 1.6 (H) 11/23/2018   HGBA1C 5.4 11/22/2018      Assessment / Plan:      Patient stable following coronary artery bypass grafting, Continues on statin aspirin and beta-blocker Plan to see back as needed currently closely followed by cardiology.   Medication Changes: No orders of the defined types were placed in this encounter.     [name] MD      [place] [place] [address] Office [phone]     02/18/2019 11:36 AM

## 2019-02-19 ENCOUNTER — Other Ambulatory Visit: Payer: Self-pay | Admitting: Physician Assistant

## 2019-02-19 ENCOUNTER — Encounter (HOSPITAL_COMMUNITY): Payer: PPO

## 2019-02-22 ENCOUNTER — Encounter (HOSPITAL_COMMUNITY): Payer: PPO

## 2019-02-22 ENCOUNTER — Other Ambulatory Visit: Payer: Self-pay | Admitting: Cardiovascular Disease

## 2019-02-22 MED ORDER — ATORVASTATIN CALCIUM 80 MG PO TABS: 80.0000 mg | ORAL_TABLET | Freq: Every day | ORAL | 3 refills | Status: DC

## 2019-02-22 NOTE — Telephone Encounter (Signed)
Pt's medication was sent to pt's pharmacy as requested. Confirmation received.  °

## 2019-02-24 ENCOUNTER — Encounter (HOSPITAL_COMMUNITY): Payer: PPO

## 2019-02-26 ENCOUNTER — Encounter (HOSPITAL_COMMUNITY): Payer: PPO

## 2019-03-01 ENCOUNTER — Encounter (HOSPITAL_COMMUNITY): Payer: PPO

## 2019-03-03 ENCOUNTER — Encounter (HOSPITAL_COMMUNITY): Payer: PPO

## 2019-03-05 ENCOUNTER — Encounter (HOSPITAL_COMMUNITY): Payer: PPO

## 2019-04-20 ENCOUNTER — Telehealth: Payer: Self-pay | Admitting: Family Medicine

## 2019-04-20 NOTE — Telephone Encounter (Signed)
I called the patient to schedule AWV with Loma Sousa ([place]), but there was no answer and no option to leave a message. If patient calls back, please schedule Medicare Wellness Visit (initial) at next available opening.  VDM ([place])

## 2019-06-01 ENCOUNTER — Telehealth: Payer: Self-pay | Admitting: Family Medicine

## 2019-06-01 NOTE — Telephone Encounter (Signed)
I left a message asking the patient to call and schedule Medicare AWV with Courtney ([facility]).  If patient calls back, please schedule Medicare Wellness Visit (initial) at next available opening.  VDM (Dee[place]Dee) 

## 2019-06-08 ENCOUNTER — Ambulatory Visit (INDEPENDENT_AMBULATORY_CARE_PROVIDER_SITE_OTHER): Payer: PPO

## 2019-06-08 ENCOUNTER — Other Ambulatory Visit: Payer: Self-pay

## 2019-06-08 VITALS — BP 126/70 | HR 56 | Temp 98.3°F | Ht 69.0 in | Wt 153.2 lb

## 2019-06-08 DIAGNOSIS — Z0000 Encounter for general adult medical examination without abnormal findings: Secondary | ICD-10-CM

## 2019-06-08 NOTE — Progress Notes (Signed)
Subjective:   Jeff Graves is a 74 y.o. male who presents for an Initial Medicare Annual Wellness Visit.  Review of Systems   Cardiac Risk Factors include: advanced age (>23men, >61 women);male gender;hypertension;dyslipidemia   Objective:    Today's Vitals   06/08/19 1345  BP: 126/70  Pulse: (!) 56  Temp: 98.3 F (36.8 C)  TempSrc: Temporal  SpO2: 96%  Weight: 153 lb 3.2 oz (69.5 kg)  Height: 5\' 9"  (1.753 m)   Body mass index is 22.62 kg/m.  Advanced Directives 06/08/2019 11/18/2018  Does Patient Have a Medical Advance Directive? Yes No  Type of Advance Directive Living will;Healthcare Power of Attorney -  Does patient want to make changes to medical advance directive? No - Patient declined -  Copy of [place] in Chart? No - copy requested -  Would patient like information on creating a medical advance directive? - No - Patient declined    Current Medications (verified) Outpatient Encounter Medications as of 06/08/2019  Medication Sig  . aspirin EC 325 MG EC tablet Take 1 tablet (325 mg total) by mouth daily.  [name] atorvastatin (LIPITOR) 80 MG tablet Take 1 tablet (80 mg total) by mouth daily at 6 PM.  . Cholecalciferol (VITAMIN D) 2000 units CAPS Take 2,000 Units by mouth daily.  . Cyanocobalamin (VITAMIN B-12) 5000 MCG LOZG Take 5,000 mcg by mouth daily.   . folic acid (FOLVITE) 1 MG tablet Take 1 tablet (1 mg total) by mouth daily.  [name] loratadine (CLARITIN) 10 MG tablet Take 10 mg by mouth daily as needed for allergies.   . metoprolol tartrate (LOPRESSOR) 25 MG tablet Take 0.5 tablets (12.5 mg total) by mouth 2 (two) times daily.  . Multiple Vitamins-Minerals (MULTIVITAMIN WITH MINERALS) tablet Take 1 tablet by mouth daily.   No facility-administered encounter medications on file as of 06/08/2019.    Allergies (verified) Patient has no known allergies.   History: Past Medical History:  Diagnosis Date  . Allergy    seasonal  . Atrial  tachycardia ([place]) 06/04/2012   12/13-QRS duration-102 ms 6/14-QRS duration-98 ms   . DDD (degenerative disc disease)   . Diverticulosis of colon   . Diverticulosis of colon without hemorrhage 01/22/2013  . Hemorrhoids   . Hypercholesterolemia   . Hypertension   . PAC (premature atrial contraction)   . Renal calculus   . RENAL CALCULUS, HX OF 12/26/2006   Qualifier: Diagnosis of  By: June Leap    . S/P rotator cuff surgery 04/15/2014  . Shoulder pain   . Sinus bradycardia    Past Surgical History:  Procedure Laterality Date  . COLONOSCOPY    . CORONARY ARTERY BYPASS GRAFT N/A 11/23/2018   Procedure: CORONARY ARTERY BYPASS GRAFTING (CABG), ON PUMP, TIMES FOUR, LIMA to LAD, SEQ SVG to PDA, PLVB, SVG to CIRCUMFLEX, USING LEFT INTERNAL MAMMARY ARTERY AND RIGHT GREATER SAPHENOUS VEIN HARVESTED ENDOSCOPICALLY AND LEFT GREATER SAPHENOUS VEIN;  Surgeon: [name], MD;  Location: [facility];  Service: Open Heart Surgery;  Laterality: N/A;  . LEFT HEART CATH AND CORONARY ANGIOGRAPHY N/A 11/18/2018   Procedure: LEFT HEART CATH AND CORONARY ANGIOGRAPHY;  Surgeon: Burnell Blanks, MD;  Location: [facility];  Service: Cardiovascular;  Laterality: N/A;  . None    . SHOULDER SURGERY Left 03/29/2014  . TEE WITHOUT CARDIOVERSION N/A 11/23/2018   Procedure: TRANSESOPHAGEAL ECHOCARDIOGRAM (TEE);  Surgeon: [name], MD;  Location: [facility];  Service: Open Heart  Surgery;  Laterality: N/A;   Family History  Problem Relation Age of Onset  . Heart attack Father 61  . Valvular heart disease Mother 76  . Lymphoma Sister        Deceased  . Breast cancer Sister        Alive  . Colon cancer Neg Hx   . Esophageal cancer Neg Hx   . Rectal cancer Neg Hx   . Stomach cancer Neg Hx    Social History   Socioeconomic History  . Marital status: Married    Spouse name: brenda  . Number of children: 1  . Years of education: 34  . Highest education level: High school graduate    Occupational History  . Occupation: Chief of Staff: [employer]  Tobacco Use  . Smoking status: Former Smoker    Packs/day: 0.50    Years: 20.00    Pack years: 10.00    Types: Cigarettes    Quit date: 02/12/1984    Years since quitting: 35.3  . Smokeless tobacco: Never Used  Substance and Sexual Activity  . Alcohol use: No    Alcohol/week: 0.0 standard drinks    Comment: social use  . Drug use: No  . Sexual activity: Not on file  Other Topics Concern  . Not on file  Social History Narrative  . Not on file   Social Determinants of Health   Financial Resource Strain: Low Risk   . Difficulty of Paying Living Expenses: Not hard at all  Food Insecurity: No Food Insecurity  . Worried About Charity fundraiser in the Last Year: Never true  . Ran Out of Food in the Last Year: Never true  Transportation Needs: No Transportation Needs  . Lack of Transportation (Medical): No  . Lack of Transportation (Non-Medical): No  Physical Activity: Insufficiently Active  . Days of Exercise per Week: 3 days  . Minutes of Exercise per Session: 30 min  Stress: No Stress Concern Present  . Feeling of Stress : Not at all  Social Connections: Slightly Isolated  . Frequency of Communication with Friends and Family: Twice a week  . Frequency of Social Gatherings with Friends and Family: Once a week  . Attends Religious Services: More than 4 times per year  . Active Member of Clubs or Organizations: No  . Attends Archivist Meetings: Never  . Marital Status: Married   Tobacco Counseling Counseling given: Not Answered   Clinical Intake:  Pre-visit preparation completed: Yes  Pain : No/denies pain  Diabetes: No  How often do you need to have someone help you when you read instructions, pamphlets, or other written materials from your doctor or pharmacy?: 1 - Never  Interpreter Needed?: No  Information entered by :: Denman George LPN  Activities of  Daily Living In your present state of health, do you have any difficulty performing the following activities: 06/08/2019 11/18/2018  Hearing? N N  Vision? N N  Difficulty concentrating or making decisions? N N  Walking or climbing stairs? N N  Dressing or bathing? N N  Doing errands, shopping? N N  Preparing Food and eating ? N -  Using the Toilet? N -  In the past six months, have you accidently leaked urine? N -  Do you have problems with loss of bowel control? N -  Managing your Medications? N -  Managing your Finances? N -  Housekeeping or managing your Housekeeping? N -  Some recent data might be hidden     Immunizations and Health Maintenance Immunization History  Administered Date(s) Administered  . H1N1 01/21/2008  . Influenza Split 01/22/2011, 01/23/2012, 12/06/2012  . Influenza Whole 01/21/2007, 01/20/2009, 12/05/2009  . Influenza, High Dose Seasonal PF 10/30/2016, 01/03/2018  . Influenza,inj,Quad PF,6+ Mos 12/15/2013, 11/12/2014  . Influenza-Unspecified 12/09/2018  . Pneumococcal Conjugate-13 04/15/2014  . Pneumococcal Polysaccharide-23 04/16/2016  . Tdap 01/23/2012  . Zoster Recombinat (Shingrix) 03/22/2017, 06/16/2017, 10/26/2017   Health Maintenance Due  Topic Date Due  . COVID-19 Vaccine (1) Never done    Patient Care Team: Vivi Barrack, MD as PCP - General (Family Medicine) Burnell Blanks, MD as PCP - Cardiology (Cardiology) Macarthur Critchley, [place] as Referring Physician (Optometry)  Indicate any recent Medical Services you may have received from other than [facility] providers in the past year (date may be approximate).    Assessment:   This is a routine wellness examination for Ayaansh.  Hearing/Vision screen No exam data present  Dietary issues and exercise activities discussed: Current Exercise Habits: The patient has a physically strenuous job, but has no regular exercise apart from work.  Goals   None    Depression Screen PHQ 2/9 Scores  06/08/2019 01/22/2019 01/05/2019 04/16/2016  PHQ - 2 Score 0 0 0 0    Fall Risk Fall Risk  06/08/2019 01/22/2019 01/05/2019 04/16/2016 01/22/2013  Falls in the past year? 0 0 0 No No  Number falls in past yr: 0 - - - -  Injury with Fall? 0 - - - -  Follow up Falls evaluation completed;Education provided;Falls prevention discussed - Falls evaluation completed - -    Is the patient's home free of loose throw rugs in walkways, pet beds, electrical cords, etc?   yes      Grab bars in the bathroom? yes      Handrails on the stairs?   yes      Adequate lighting?   yes  Timed Get Up and Go performed: completed and within normal timeframe; no gait abnormalities noted   Cognitive Function: no cognitive concerns at this time    6CIT Screen 06/08/2019  What Year? 0 points  What month? 0 points  What time? 0 points  Count back from 20 0 points  Months in reverse 0 points  Repeat phrase 0 points  Total Score 0    Screening Tests Health Maintenance  Topic Date Due  . COVID-19 Vaccine (1) Never done  . Hepatitis C Screening  06/07/2028 (Originally Apr 22, 1945)  . INFLUENZA VACCINE  09/12/2019  . TETANUS/TDAP  01/22/2022  . COLONOSCOPY  04/03/2023  . PNA vac Low Risk Adult  Completed    Qualifies for Shingles Vaccine? Shingrix completed   Cancer Screenings: Lung: Low Dose CT Chest recommended if Age 62-80 years, 30 pack-year currently smoking OR have quit w/in 15years. Patient does not qualify. Colorectal: colonoscopy 04/02/13     Plan:  I have personally reviewed and addressed the Medicare Annual Wellness questionnaire and have noted the following in the patient's chart:  A. Medical and social history B. Use of alcohol, tobacco or illicit drugs  C. Current medications and supplements D. Functional ability and status E.  Nutritional status F.  Physical activity G. Advance directives H. List of other physicians I.  Hospitalizations, surgeries, and ER visits in previous 12 months J.    [place] such as hearing and vision if needed, cognitive and depression L. Referrals, records requested, and appointments- none  In addition, I have reviewed and discussed with patient certain preventive protocols, quality metrics, and best practice recommendations. A written personalized care plan for preventive services as well as general preventive health recommendations were provided to patient.   Signed,  Denman George, LPN  Nurse Health Advisor   Nurse Notes: no additional; patient states that he has completed Covid vaccine

## 2019-06-08 NOTE — Patient Instructions (Addendum)
Jeff Graves , Thank you for taking time to come for your Medicare Wellness Visit. I appreciate your ongoing commitment to your health goals. Please review the following plan we discussed and let me know if I can assist you in the future.   Screening recommendations/referrals: Colorectal Screening: up to date; last colonoscopy 04/02/13  Vision and Dental Exams: Recommended annual ophthalmology exams for early detection of glaucoma and other disorders of the eye Recommended annual dental exams for proper oral hygiene  Vaccinations: Influenza vaccine: completed 12/09/18 Pneumococcal vaccine: up to date; last 04/16/16 Tdap vaccine: up to date; last 01/23/12  Shingles vaccine: Shingrix completed  Covid vaccine: Completed   Advanced directives: Please bring a copy of your POA (Power of Attorney) and/or Living Will to your next appointment.  Goals: Recommend to drink at least 6-8 8oz glasses of water per day and consume a balanced diet rich in fresh fruits and vegetables.   Next appointment: Please schedule your Annual Wellness Visit with your Nurse Health Advisor in one year.  Preventive Care 3 Years and Older, Male Preventive care refers to lifestyle choices and visits with your health care provider that can promote health and wellness. What does preventive care include?  A yearly physical exam. This is also called an annual well check.  Dental exams once or twice a year.  Routine eye exams. Ask your health care provider how often you should have your eyes checked.  Personal lifestyle choices, including:  Daily care of your teeth and gums.  Regular physical activity.  Eating a healthy diet.  Avoiding tobacco and drug use.  Limiting alcohol use.  Practicing safe sex.  Taking low doses of aspirin every day if recommended by your health care provider..  Taking vitamin and mineral supplements as recommended by your health care provider. What happens during an annual well check?  The services and screenings done by your health care provider during your annual well check will depend on your age, overall health, lifestyle risk factors, and family history of disease. Counseling  Your health care provider may ask you questions about your:  Alcohol use.  Tobacco use.  Drug use.  Emotional well-being.  Home and relationship well-being.  Sexual activity.  Eating habits.  History of falls.  Memory and ability to understand (cognition).  Work and work Statistician. Screening  You may have the following tests or measurements:  Height, weight, and BMI.  Blood pressure.  Lipid and cholesterol levels. These may be checked every 5 years, or more frequently if you are over 59 years old.  Skin check.  Lung cancer screening. You may have this screening every year starting at age 12 if you have a 30-pack-year history of smoking and currently smoke or have quit within the past 15 years.  Fecal occult blood test (FOBT) of the stool. You may have this test every year starting at age 62.  Flexible sigmoidoscopy or colonoscopy. You may have a sigmoidoscopy every 5 years or a colonoscopy every 10 years starting at age 9.  Prostate cancer screening. Recommendations will vary depending on your family history and other risks.  Hepatitis C blood test.  Hepatitis B blood test.  Sexually transmitted disease (STD) testing.  Diabetes screening. This is done by checking your blood sugar (glucose) after you have not eaten for a while (fasting). You may have this done every 1-3 years.  Abdominal aortic aneurysm (AAA) screening. You may need this if you are a current or former smoker.  Osteoporosis. You  may be screened starting at age 24 if you are at high risk. Talk with your health care provider about your test results, treatment options, and if necessary, the need for more tests. Vaccines  Your health care provider may recommend certain vaccines, such as:  Influenza  vaccine. This is recommended every year.  Tetanus, diphtheria, and acellular pertussis (Tdap, Td) vaccine. You may need a Td booster every 10 years.  Zoster vaccine. You may need this after age 19.  Pneumococcal 13-valent conjugate (PCV13) vaccine. One dose is recommended after age 13.  Pneumococcal polysaccharide (PPSV23) vaccine. One dose is recommended after age 63. Talk to your health care provider about which screenings and vaccines you need and how often you need them. This information is not intended to replace advice given to you by your health care provider. Make sure you discuss any questions you have with your health care provider. Document Released: 02/24/2015 Document Revised: 10/18/2015 Document Reviewed: 11/29/2014 Elsevier Interactive Patient Education  2017 [place] Prevention in the Home Falls can cause injuries. They can happen to people of all ages. There are many things you can do to make your home safe and to help prevent falls. What can I do on the outside of my home?  Regularly fix the edges of walkways and driveways and fix any cracks.  Remove anything that might make you trip as you walk through a door, such as a raised step or threshold.  Trim any bushes or trees on the path to your home.  Use bright outdoor lighting.  Clear any walking paths of anything that might make someone trip, such as rocks or tools.  Regularly check to see if handrails are loose or broken. Make sure that both sides of any steps have handrails.  Any raised decks and porches should have guardrails on the edges.  Have any leaves, snow, or ice cleared regularly.  Use sand or salt on walking paths during winter.  Clean up any spills in your garage right away. This includes oil or grease spills. What can I do in the bathroom?  Use night lights.  Install grab bars by the toilet and in the tub and shower. Do not use towel bars as grab bars.  Use non-skid mats or decals  in the tub or shower.  If you need to sit down in the shower, use a plastic, non-slip stool.  Keep the floor dry. Clean up any water that spills on the floor as soon as it happens.  Remove soap buildup in the tub or shower regularly.  Attach bath mats securely with double-sided non-slip rug tape.  Do not have throw rugs and other things on the floor that can make you trip. What can I do in the bedroom?  Use night lights.  Make sure that you have a light by your bed that is easy to reach.  Do not use any sheets or blankets that are too big for your bed. They should not hang down onto the floor.  Have a firm chair that has side arms. You can use this for support while you get dressed.  Do not have throw rugs and other things on the floor that can make you trip. What can I do in the kitchen?  Clean up any spills right away.  Avoid walking on wet floors.  Keep items that you use a lot in easy-to-reach places.  If you need to reach something above you, use a strong step stool that  has a grab bar.  Keep electrical cords out of the way.  Do not use floor polish or wax that makes floors slippery. If you must use wax, use non-skid floor wax.  Do not have throw rugs and other things on the floor that can make you trip. What can I do with my stairs?  Do not leave any items on the stairs.  Make sure that there are handrails on both sides of the stairs and use them. Fix handrails that are broken or loose. Make sure that handrails are as long as the stairways.  Check any carpeting to make sure that it is firmly attached to the stairs. Fix any carpet that is loose or worn.  Avoid having throw rugs at the top or bottom of the stairs. If you do have throw rugs, attach them to the floor with carpet tape.  Make sure that you have a light switch at the top of the stairs and the bottom of the stairs. If you do not have them, ask someone to add them for you. What else can I do to help  prevent falls?  Wear shoes that:  Do not have high heels.  Have rubber bottoms.  Are comfortable and fit you well.  Are closed at the toe. Do not wear sandals.  If you use a stepladder:  Make sure that it is fully opened. Do not climb a closed stepladder.  Make sure that both sides of the stepladder are locked into place.  Ask someone to hold it for you, if possible.  Clearly mark and make sure that you can see:  Any grab bars or handrails.  First and last steps.  Where the edge of each step is.  Use tools that help you move around (mobility aids) if they are needed. These include:  Canes.  Walkers.  Scooters.  Crutches.  Turn on the lights when you go into a dark area. Replace any light bulbs as soon as they burn out.  Set up your furniture so you have a clear path. Avoid moving your furniture around.  If any of your floors are uneven, fix them.  If there are any pets around you, be aware of where they are.  Review your medicines with your doctor. Some medicines can make you feel dizzy. This can increase your chance of falling. Ask your doctor what other things that you can do to help prevent falls. This information is not intended to replace advice given to you by your health care provider. Make sure you discuss any questions you have with your health care provider. Document Released: 11/24/2008 Document Revised: 07/06/2015 Document Reviewed: 03/04/2014 Elsevier Interactive Patient Education  2017 [employer].

## 2019-08-02 NOTE — Progress Notes (Signed)
Cardiology Office Note    Date:  08/04/2019   ID:  Jeff Graves, DOB 28-Nov-1945, MRN [id number]  PCP:  Vivi Barrack, MD  Cardiologist: Lauree Chandler, MD EPS: None  No chief complaint on file.   History of Present Illness:  Jeff Graves is a 74 y.o. male with history of HLD, HTN, atrial tachycardia and CAD s/p CABG October 2020 (LIMA to LAD, SVG to Circumflex, SVG sequential to PDA and PLA). He had a postoperative pneumothorax and post-operative atrial fibrillation. He converted to sinus quickly on amiodarone.   Last saw Dr. Angelena Form 01/2019 and Amiodarone stopped.  Patient comes in for f/u. Denies chest pain, dyspnea, dizziness, palpitations or presyncope. Goes to a gym and does cardio 5 days/week and some weights. Received his covid19 vaccines.    Past Medical History:  Diagnosis Date  . Allergy    seasonal  . Atrial tachycardia ([place]) 06/04/2012   12/13-QRS duration-102 ms 6/14-QRS duration-98 ms   . DDD (degenerative disc disease)   . Diverticulosis of colon   . Diverticulosis of colon without hemorrhage 01/22/2013  . Hemorrhoids   . Hypercholesterolemia   . Hypertension   . PAC (premature atrial contraction)   . Renal calculus   . RENAL CALCULUS, HX OF 12/26/2006   Qualifier: Diagnosis of  By: June Leap    . S/P rotator cuff surgery 04/15/2014  . Shoulder pain   . Sinus bradycardia     Past Surgical History:  Procedure Laterality Date  . COLONOSCOPY    . CORONARY ARTERY BYPASS GRAFT N/A 11/23/2018   Procedure: CORONARY ARTERY BYPASS GRAFTING (CABG), ON PUMP, TIMES FOUR, LIMA to LAD, SEQ SVG to PDA, PLVB, SVG to CIRCUMFLEX, USING LEFT INTERNAL MAMMARY ARTERY AND RIGHT GREATER SAPHENOUS VEIN HARVESTED ENDOSCOPICALLY AND LEFT GREATER SAPHENOUS VEIN;  Surgeon: [name], MD;  Location: [facility];  Service: Open Heart Surgery;  Laterality: N/A;  . LEFT HEART CATH AND CORONARY ANGIOGRAPHY N/A 11/18/2018   Procedure: LEFT HEART CATH AND CORONARY  ANGIOGRAPHY;  Surgeon: Burnell Blanks, MD;  Location: [facility];  Service: Cardiovascular;  Laterality: N/A;  . None    . SHOULDER SURGERY Left 03/29/2014  . TEE WITHOUT CARDIOVERSION N/A 11/23/2018   Procedure: TRANSESOPHAGEAL ECHOCARDIOGRAM (TEE);  Surgeon: [name], MD;  Location: [facility];  Service: Open Heart Surgery;  Laterality: N/A;    Current Medications: Current Meds  Medication Sig  . aspirin EC 81 MG tablet Take 81 mg by mouth daily. Swallow whole.  [name] atorvastatin (LIPITOR) 80 MG tablet Take 1 tablet (80 mg total) by mouth daily at 6 PM.  . Cholecalciferol (VITAMIN D) 2000 units CAPS Take 2,000 Units by mouth daily.  . Cyanocobalamin (VITAMIN B-12) 5000 MCG LOZG Take 5,000 mcg by mouth daily.   . folic acid (FOLVITE) 1 MG tablet Take 1 tablet (1 mg total) by mouth daily.  [name] loratadine (CLARITIN) 10 MG tablet Take 10 mg by mouth daily as needed for allergies.   . metoprolol tartrate (LOPRESSOR) 25 MG tablet Take 0.5 tablets (12.5 mg total) by mouth 2 (two) times daily.  . Multiple Vitamins-Minerals (MULTIVITAMIN WITH MINERALS) tablet Take 1 tablet by mouth daily.  . [DISCONTINUED] aspirin EC 325 MG EC tablet Take 1 tablet (325 mg total) by mouth daily.     Allergies:   Patient has no known allergies.   Social History   Socioeconomic History  . Marital status: Married    Spouse name: brenda  .  Number of children: 1  . Years of education: 18  . Highest education level: High school graduate  Occupational History  . Occupation: Chief of Staff: [employer]  Tobacco Use  . Smoking status: Former Smoker    Packs/day: 0.50    Years: 20.00    Pack years: 10.00    Types: Cigarettes    Quit date: 02/12/1984    Years since quitting: 35.4  . Smokeless tobacco: Never Used  Substance and Sexual Activity  . Alcohol use: No    Alcohol/week: 0.0 standard drinks    Comment: social use  . Drug use: No  . Sexual activity: Not on  file  Other Topics Concern  . Not on file  Social History Narrative  . Not on file   Social Determinants of Health   Financial Resource Strain: Low Risk   . Difficulty of Paying Living Expenses: Not hard at all  Food Insecurity: No Food Insecurity  . Worried About Charity fundraiser in the Last Year: Never true  . Ran Out of Food in the Last Year: Never true  Transportation Needs: No Transportation Needs  . Lack of Transportation (Medical): No  . Lack of Transportation (Non-Medical): No  Physical Activity: Insufficiently Active  . Days of Exercise per Week: 3 days  . Minutes of Exercise per Session: 30 min  Stress: No Stress Concern Present  . Feeling of Stress : Not at all  Social Connections: Moderately Integrated  . Frequency of Communication with Friends and Family: Twice a week  . Frequency of Social Gatherings with Friends and Family: Once a week  . Attends Religious Services: More than 4 times per year  . Active Member of Clubs or Organizations: No  . Attends Archivist Meetings: Never  . Marital Status: Married     Family History:  The patient's family history includes Breast cancer in his sister; Heart attack (age of onset: 55) in his father; Lymphoma in his sister; Valvular heart disease (age of onset: 80) in his mother.   ROS:   Please see the history of present illness.    ROS All other systems reviewed and are negative.   PHYSICAL EXAM:   VS:  BP 128/82   Pulse (!) 52   Ht 5\' 9"  (1.753 m)   Wt 151 lb 9.6 oz (68.8 kg)   SpO2 99%   BMI 22.39 kg/m   Physical Exam  GEN: Well nourished, well developed, in no acute distress  Neck: no JVD, carotid bruits, or masses Cardiac:RRR; 1/6 systolic murmur LSB  Respiratory:  clear to auscultation bilaterally, normal work of breathing GI: soft, nontender, nondistended, + BS Ext: without cyanosis, clubbing, or edema, Good distal pulses bilaterally Neuro:  Alert and Oriented x 3 Psych: euthymic mood, full  affect  Wt Readings from Last 3 Encounters:  08/04/19 151 lb 9.6 oz (68.8 kg)  06/08/19 153 lb 3.2 oz (69.5 kg)  02/18/19 155 lb (70.3 kg)      Studies/Labs Reviewed:   EKG:  EKG is not ordered today.    Recent Labs: 11/24/2018: Magnesium 3.0 11/28/2018: BUN 30; Creatinine, Ser 1.26; Hemoglobin 7.7; Platelets 257; Potassium 4.5; Sodium 134 02/03/2019: ALT 47   Lipid Panel    Component Value Date/Time   CHOL 129 02/03/2019 0728   TRIG 31 02/03/2019 0728   HDL 83 02/03/2019 0728   CHOLHDL 1.6 02/03/2019 0728   CHOLHDL 2.0 11/20/2018 0519   VLDL 4  11/20/2018 0519   [place] 37 02/03/2019 0728    Additional studies/ records that were reviewed today include:  OPERATIVE REPORT DATE OF PROCEDURE:  11/23/2018 PREOPERATIVE DIAGNOSIS:  Coronary occlusive disease with new onset of angina. POSTOPERATIVE DIAGNOSIS:  Coronary occlusive disease with new onset of angina. SURGICAL PROCEDURE:  Coronary artery bypass grafting x4 with the left internal mammary to the left anterior descending coronary artery, reverse saphenous vein graft to the circumflex coronary artery, sequential reverse saphenous vein graft to the  posterior descending and posterolateral branches of the right coronary artery with right thigh and calf endoscopic vein harvesting and open left calf greater saphenous vein harvesting.   Intraop TEE 11/2018 POST-OP IMPRESSIONS  - Left Ventricle: The left ventricle is unchanged from pre-bypass.  - Right Ventricle: The right ventricle appears unchanged from pre-bypass.  - Aorta: The aorta appears unchanged from pre-bypass.  - Left Atrium: The left atrium appears unchanged from pre-bypass.  - Left Atrial Appendage: The left atrial appendage appears unchanged from  pre-bypass.  - Aortic Valve: The aortic valve appears unchanged from pre-bypass.  - Mitral Valve: The mitral valve appears unchanged from pre-bypass.  - Tricuspid Valve: The tricuspid valve appears unchanged from  pre-bypass.  - Interatrial Septum: The interatrial septum appears unchanged from  pre-bypass.  - Interventricular Septum: The interventricular septum appears unchanged  from  pre-bypass.  - Pericardium: The pericardium appears unchanged from pre-bypass.   SUMMARY    Post Bypass:    - LVEF > 50% (CO > 5 L/min, CI > 2.5) w/ Dopamine gtt  - Mild improvement of RWMA's  - Tricuspid, Pulmonic, Mitral and Aortic valve unchanged  - No aortic dissection noted after cannula removed   PRE-OP FINDINGS   Left Ventricle: The left ventricle has normal systolic function, with an  ejection fraction of 55-60%. The cavity size was normal. There is mildly  increased left ventricular wall thickness.      ASSESSMENT:    1. Coronary artery disease involving coronary bypass graft of native heart without angina pectoris   2. Paroxysmal atrial fibrillation (HCC)   3. Hyperlipidemia, unspecified hyperlipidemia type   4. Essential hypertension   5. Stage 3a chronic kidney disease      PLAN:  In order of problems listed above:   1. CAD s/p CABG without angina: Doing well. No chest pain. Continue statin and beta blocker. Reduce ASA 81 mg daily. Exercising 5 days/week   2.Post-operative atrial fibrillation: No recurrence. off amiodarone. Continue ASA and beta blocker.    3. Hyperlipidemia: . Continue statin. LDL 37 01/2019-recheck in December at f/u with Dr. Angelena Form  4. HTN: controlled   5. CKD stage 3: Crt 12.26 11/2018 recheck renal in Dec    Medication Adjustments/Labs and Tests Ordered: Current medicines are reviewed at length with the patient today.  Concerns regarding medicines are outlined above.  Medication changes, Labs and Tests ordered today are listed in the Patient Instructions below. Patient Instructions  Medication Instructions:  Your physician recommends that you continue on your current medications as directed. Please refer to the Current Medication list given to you  today.  *If you need a refill on your cardiac medications before your next appointment, please call your pharmacy*   Lab Work: Your physician recommends that you return for a FASTING lipid profile, CBC, and CMET in 6 months  If you have labs (blood work) drawn today and your tests are completely normal, you will receive your results only by: [name] MyChart Message (  if you have MyChart) OR . A paper copy in the mail If you have any lab test that is abnormal or we need to change your treatment, we will call you to review the results.   Testing/Procedures: None   Follow-Up: At [facility], you and your health needs are our priority.  As part of our continuing mission to provide you with exceptional heart care, we have created designated Provider Care Teams.  These Care Teams include your primary Cardiologist (physician) and Advanced Practice Providers (APPs -  Physician Assistants and Nurse Practitioners) who all work together to provide you with the care you need, when you need it.  We recommend signing up for the patient portal called "MyChart".  Sign up information is provided on this After Visit Summary.  MyChart is used to connect with patients for Virtual Visits (Telemedicine).  Patients are able to view lab/test results, encounter notes, upcoming appointments, etc.  Non-urgent messages can be sent to your provider as well.   To learn more about what you can do with MyChart, go to [email or web address].    Your next appointment:   6 month(s)  The format for your next appointment:   In Person  Provider:   You may see Lauree Chandler, MD or one of the following Advanced Practice Providers on your designated Care Team:    Melina Copa, PA-C  Ermalinda Barrios, PA-C    Other Instructions None     Signed, Ermalinda Barrios, Hershal Coria  08/04/2019 8:30 AM    [address], [place], [place]  [address] Phone: [phone]; Fax: [phone]

## 2019-08-04 ENCOUNTER — Other Ambulatory Visit: Payer: Self-pay

## 2019-08-04 ENCOUNTER — Encounter: Payer: Self-pay | Admitting: Physician Assistant

## 2019-08-04 ENCOUNTER — Ambulatory Visit: Payer: PPO | Admitting: Physician Assistant

## 2019-08-04 VITALS — BP 128/82 | HR 52 | Ht 69.0 in | Wt 151.6 lb

## 2019-08-04 DIAGNOSIS — E785 Hyperlipidemia, unspecified: Secondary | ICD-10-CM

## 2019-08-04 DIAGNOSIS — I10 Essential (primary) hypertension: Secondary | ICD-10-CM | POA: Diagnosis not present

## 2019-08-04 DIAGNOSIS — I480 Paroxysmal atrial fibrillation: Secondary | ICD-10-CM

## 2019-08-04 DIAGNOSIS — N1831 Chronic kidney disease, stage 3a: Secondary | ICD-10-CM

## 2019-08-04 DIAGNOSIS — I25810 Atherosclerosis of coronary artery bypass graft(s) without angina pectoris: Secondary | ICD-10-CM

## 2019-08-04 NOTE — Patient Instructions (Addendum)
Medication Instructions:  Your physician has recommended you make the following change in your medication:   DECREASE: aspirin to 81 mg once a day  *If you need a refill on your cardiac medications before your next appointment, please call your pharmacy*   Lab Work: Your physician recommends that you return for a FASTING lipid profile, CBC, and CMET in 6 months  If you have labs (blood work) drawn today and your tests are completely normal, you will receive your results only by: [name] MyChart Message (if you have MyChart) OR . A paper copy in the mail If you have any lab test that is abnormal or we need to change your treatment, we will call you to review the results.   Testing/Procedures: None   Follow-Up: At [facility], you and your health needs are our priority.  As part of our continuing mission to provide you with exceptional heart care, we have created designated Provider Care Teams.  These Care Teams include your primary Cardiologist (physician) and Advanced Practice Providers (APPs -  Physician Assistants and Nurse Practitioners) who all work together to provide you with the care you need, when you need it.  We recommend signing up for the patient portal called "MyChart".  Sign up information is provided on this After Visit Summary.  MyChart is used to connect with patients for Virtual Visits (Telemedicine).  Patients are able to view lab/test results, encounter notes, upcoming appointments, etc.  Non-urgent messages can be sent to your provider as well.   To learn more about what you can do with MyChart, go to [email or web address].    Your next appointment:   6 month(s)  The format for your next appointment:   In Person  Provider:   You may see Lauree Chandler, MD or one of the following Advanced Practice Providers on your designated Care Team:    Melina Copa, PA-C  Ermalinda Barrios, PA-C    Other Instructions None

## 2019-09-06 DIAGNOSIS — M25511 Pain in right shoulder: Secondary | ICD-10-CM | POA: Diagnosis not present

## 2019-09-06 DIAGNOSIS — M19011 Primary osteoarthritis, right shoulder: Secondary | ICD-10-CM | POA: Diagnosis not present

## 2019-09-06 DIAGNOSIS — M19019 Primary osteoarthritis, unspecified shoulder: Secondary | ICD-10-CM | POA: Diagnosis not present

## 2019-09-20 DIAGNOSIS — M19011 Primary osteoarthritis, right shoulder: Secondary | ICD-10-CM | POA: Diagnosis not present

## 2019-09-28 DIAGNOSIS — M19011 Primary osteoarthritis, right shoulder: Secondary | ICD-10-CM | POA: Diagnosis not present

## 2020-01-25 ENCOUNTER — Encounter: Payer: PPO | Admitting: Family Medicine

## 2020-02-15 ENCOUNTER — Ambulatory Visit (INDEPENDENT_AMBULATORY_CARE_PROVIDER_SITE_OTHER): Payer: PPO

## 2020-02-15 ENCOUNTER — Encounter: Payer: Self-pay | Admitting: Family Medicine

## 2020-02-15 ENCOUNTER — Other Ambulatory Visit: Payer: Self-pay

## 2020-02-15 ENCOUNTER — Ambulatory Visit (INDEPENDENT_AMBULATORY_CARE_PROVIDER_SITE_OTHER): Payer: PPO | Admitting: Family Medicine

## 2020-02-15 VITALS — BP 114/67 | HR 56 | Temp 98.1°F | Ht 69.0 in | Wt 153.0 lb

## 2020-02-15 DIAGNOSIS — I10 Essential (primary) hypertension: Secondary | ICD-10-CM

## 2020-02-15 DIAGNOSIS — N1831 Chronic kidney disease, stage 3a: Secondary | ICD-10-CM

## 2020-02-15 DIAGNOSIS — Z0001 Encounter for general adult medical examination with abnormal findings: Secondary | ICD-10-CM

## 2020-02-15 DIAGNOSIS — Z87891 Personal history of nicotine dependence: Secondary | ICD-10-CM

## 2020-02-15 DIAGNOSIS — I25119 Atherosclerotic heart disease of native coronary artery with unspecified angina pectoris: Secondary | ICD-10-CM

## 2020-02-15 DIAGNOSIS — I480 Paroxysmal atrial fibrillation: Secondary | ICD-10-CM | POA: Diagnosis not present

## 2020-02-15 DIAGNOSIS — I700 Atherosclerosis of aorta: Secondary | ICD-10-CM | POA: Diagnosis not present

## 2020-02-15 DIAGNOSIS — I471 Supraventricular tachycardia: Secondary | ICD-10-CM | POA: Diagnosis not present

## 2020-02-15 NOTE — Assessment & Plan Note (Signed)
On aspirin and statin per cardiology. °

## 2020-02-15 NOTE — Assessment & Plan Note (Signed)
Stable.  Regular rate and rhythm today.  Continue management per cardiology.

## 2020-02-15 NOTE — Patient Instructions (Signed)
It was very nice to see you today!  Keep up the good work!  No changes today.  We will check a chest x-ray.  I will see back in 1 year for your next annual physical.  Please see me sooner if needed.  Take care, Dr Jerline Pain  Please try these tips to maintain a healthy lifestyle:   Eat at least 3 REAL meals and 1-2 snacks per day.  Aim for no more than 5 hours between eating.  If you eat breakfast, please do so within one hour of getting up.    Each meal should contain half fruits/vegetables, one quarter protein, and one quarter carbs (no bigger than a computer mouse)   Cut down on sweet beverages. This includes juice, soda, and sweet tea.     Drink at least 1 glass of water with each meal and aim for at least 8 glasses per day   Exercise at least 150 minutes every week.    Preventive Care 5 Years and Older, Male Preventive care refers to lifestyle choices and visits with your health care provider that can promote health and wellness. This includes:  A yearly physical exam. This is also called an annual well check.  Regular dental and eye exams.  Immunizations.  Screening for certain conditions.  Healthy lifestyle choices, such as diet and exercise. What can I expect for my preventive care visit? Physical exam Your health care provider will check:  Height and weight. These may be used to calculate body mass index (BMI), which is a measurement that tells if you are at a healthy weight.  Heart rate and blood pressure.  Your skin for abnormal spots. Counseling Your health care provider may ask you questions about:  Alcohol, tobacco, and drug use.  Emotional well-being.  Home and relationship well-being.  Sexual activity.  Eating habits.  History of falls.  Memory and ability to understand (cognition).  Work and work Statistician. What immunizations do I need?  Influenza (flu) vaccine  This is recommended every year. Tetanus, diphtheria, and  pertussis (Tdap) vaccine  You may need a Td booster every 10 years. Varicella (chickenpox) vaccine  You may need this vaccine if you have not already been vaccinated. Zoster (shingles) vaccine  You may need this after age 4. Pneumococcal conjugate (PCV13) vaccine  One dose is recommended after age 35. Pneumococcal polysaccharide (PPSV23) vaccine  One dose is recommended after age 37. Measles, mumps, and rubella (MMR) vaccine  You may need at least one dose of MMR if you were born in 1957 or later. You may also need a second dose. Meningococcal conjugate (MenACWY) vaccine  You may need this if you have certain conditions. Hepatitis A vaccine  You may need this if you have certain conditions or if you travel or work in places where you may be exposed to hepatitis A. Hepatitis B vaccine  You may need this if you have certain conditions or if you travel or work in places where you may be exposed to hepatitis B. Haemophilus influenzae type b (Hib) vaccine  You may need this if you have certain conditions. You may receive vaccines as individual doses or as more than one vaccine together in one shot (combination vaccines). Talk with your health care provider about the risks and benefits of combination vaccines. What tests do I need? Blood tests  Lipid and cholesterol levels. These may be checked every 5 years, or more frequently depending on your overall health.  Hepatitis C test.  Hepatitis B test. Screening  Lung cancer screening. You may have this screening every year starting at age 53 if you have a 30-pack-year history of smoking and currently smoke or have quit within the past 15 years.  Colorectal cancer screening. All adults should have this screening starting at age 31 and continuing until age 24. Your health care provider may recommend screening at age 16 if you are at increased risk. You will have tests every 1-10 years, depending on your results and the type of  screening test.  Prostate cancer screening. Recommendations will vary depending on your family history and other risks.  Diabetes screening. This is done by checking your blood sugar (glucose) after you have not eaten for a while (fasting). You may have this done every 1-3 years.  Abdominal aortic aneurysm (AAA) screening. You may need this if you are a current or former smoker.  Sexually transmitted disease (STD) testing. Follow these instructions at home: Eating and drinking  Eat a diet that includes fresh fruits and vegetables, whole grains, lean protein, and low-fat dairy products. Limit your intake of foods with high amounts of sugar, saturated fats, and salt.  Take vitamin and mineral supplements as recommended by your health care provider.  Do not drink alcohol if your health care provider tells you not to drink.  If you drink alcohol: ? Limit how much you have to 0-2 drinks a day. ? Be aware of how much alcohol is in your drink. In the [place] one drink equals one 12 oz bottle of beer (355 mL), one 5 oz glass of wine (148 mL), or one 1 oz glass of hard liquor (44 mL). Lifestyle  Take daily care of your teeth and gums.  Stay active. Exercise for at least 30 minutes on 5 or more days each week.  Do not use any products that contain nicotine or tobacco, such as cigarettes, e-cigarettes, and chewing tobacco. If you need help quitting, ask your health care provider.  If you are sexually active, practice safe sex. Use a condom or other form of protection to prevent STIs (sexually transmitted infections).  Talk with your health care provider about taking a low-dose aspirin or statin. What's next?  Visit your health care provider once a year for a well check visit.  Ask your health care provider how often you should have your eyes and teeth checked.  Stay up to date on all vaccines. This information is not intended to replace advice given to you by your health care provider.  Make sure you discuss any questions you have with your health care provider. Document Revised: 01/22/2018 Document Reviewed: 01/22/2018 Elsevier Patient Education  2020 [employer].

## 2020-02-15 NOTE — Assessment & Plan Note (Signed)
Stable on metoprolol 12.5 mg twice daily.

## 2020-02-15 NOTE — Progress Notes (Signed)
Chief Complaint:  Jeff Graves is a 75 y.o. male who presents today for his annual comprehensive physical exam.    Assessment/Plan:  Chronic Problems Addressed Today: Essential hypertension Stable on metoprolol 12.5 mg twice daily.  CKD (chronic kidney disease), stage III We will need to check BMET next blood draw.  Coronary artery disease involving native coronary artery of native heart with angina pectoris ([place]) On aspirin and statin per cardiology.  Atrial tachycardia Stable.  Regular rate and rhythm today.  Continue management per cardiology.  Former smoker Does not qualify for low dose CT lung cancer screening. Will check CXR per patient request.   Preventative Healthcare: UTD on vaccines and screenings. Due for next colonoscopy in 2025.  Patient Counseling(The following topics were reviewed and/or handout was given):  -Nutrition: Stressed importance of moderation in sodium/caffeine intake, saturated fat and cholesterol, caloric balance, sufficient intake of fresh fruits, vegetables, and fiber.  -Stressed the importance of regular exercise.   -Substance Abuse: Discussed cessation/primary prevention of tobacco, alcohol, or other drug use; driving or other dangerous activities under the influence; availability of treatment for abuse.   -Injury prevention: Discussed safety belts, safety helmets, smoke detector, smoking near bedding or upholstery.   -Sexuality: Discussed sexually transmitted diseases, partner selection, use of condoms, avoidance of unintended pregnancy and contraceptive alternatives.   -Dental health: Discussed importance of regular tooth brushing, flossing, and dental visits.  -Health maintenance and immunizations reviewed. Please refer to Health maintenance section.  Return to care in 1 year for next preventative visit.     Subjective:  HPI:  He has no acute complaints today.   Lifestyle Diet: Balanced. Getting plenty of vegetables.  Exercise:  Goes to gym 4-5 times per week. Working on cardio.   Depression screen [facility] 2/9 06/08/2019  Decreased Interest 0  Down, Depressed, Hopeless 0  PHQ - 2 Score 0   ROS: Per HPI, otherwise a complete review of systems was negative.   PMH:  The following were reviewed and entered/updated in epic: Past Medical History:  Diagnosis Date  . Allergy    seasonal  . Atrial tachycardia ([place]) 06/04/2012   12/13-QRS duration-102 ms 6/14-QRS duration-98 ms   . DDD (degenerative disc disease)   . Diverticulosis of colon   . Diverticulosis of colon without hemorrhage 01/22/2013  . Hemorrhoids   . Hypercholesterolemia   . Hypertension   . PAC (premature atrial contraction)   . Renal calculus   . RENAL CALCULUS, HX OF 12/26/2006   Qualifier: Diagnosis of  By: June Leap    . S/P rotator cuff surgery 04/15/2014  . Shoulder pain   . Sinus bradycardia    Patient Active Problem List   Diagnosis Date Noted  . Former smoker 02/15/2020  . S/P CABG x 4 11/24/2018  . CKD (chronic kidney disease), stage III 11/20/2018  . Coronary artery disease involving native coronary artery of native heart with angina pectoris ([place])   . Cervical disc disorder with radiculopathy of cervical region 01/22/2013  . Atrial tachycardia 06/04/2012  . Essential hypertension 12/26/2006   Past Surgical History:  Procedure Laterality Date  . COLONOSCOPY    . CORONARY ARTERY BYPASS GRAFT N/A 11/23/2018   Procedure: CORONARY ARTERY BYPASS GRAFTING (CABG), ON PUMP, TIMES FOUR, LIMA to LAD, SEQ SVG to PDA, PLVB, SVG to CIRCUMFLEX, USING LEFT INTERNAL MAMMARY ARTERY AND RIGHT GREATER SAPHENOUS VEIN HARVESTED ENDOSCOPICALLY AND LEFT GREATER SAPHENOUS VEIN;  Surgeon: [name], MD;  Location: [facility];  Service:  Open Heart Surgery;  Laterality: N/A;  . LEFT HEART CATH AND CORONARY ANGIOGRAPHY N/A 11/18/2018   Procedure: LEFT HEART CATH AND CORONARY ANGIOGRAPHY;  Surgeon: Kathleene Hazel, MD;  [facility]: [facility];  Service: Cardiovascular;  Laterality: N/A;  . None    . SHOULDER SURGERY Left 03/29/2014  . TEE WITHOUT CARDIOVERSION N/A 11/23/2018   Procedure: TRANSESOPHAGEAL ECHOCARDIOGRAM (TEE);  Surgeon: Delight Ovens, MD;  Location: [facility] OR;  Service: Open Heart Surgery;  Laterality: N/A;    Family History  Problem Relation Age of Onset  . Heart attack Father 37  . Valvular heart disease Mother 73  . Lymphoma Sister        Deceased  . Breast cancer Sister        Alive  . Colon cancer Neg Hx   . Esophageal cancer Neg Hx   . Rectal cancer Neg Hx   . Stomach cancer Neg Hx     Medications- reviewed and updated Current Outpatient Medications  Medication Sig Dispense Refill  . aspirin EC 81 MG tablet Take 81 mg by mouth daily. Swallow whole.    [name] atorvastatin (LIPITOR) 80 MG tablet Take 1 tablet (80 mg total) by mouth daily at 6 PM. 90 tablet 3  . Cholecalciferol (VITAMIN D) 2000 units CAPS Take 2,000 Units by mouth daily.    . Cyanocobalamin (VITAMIN B-12) 5000 MCG LOZG Take 5,000 mcg by mouth daily.     [name] loratadine (CLARITIN) 10 MG tablet Take 10 mg by mouth daily as needed for allergies.     . metoprolol tartrate (LOPRESSOR) 25 MG tablet Take 0.5 tablets (12.5 mg total) by mouth 2 (two) times daily. 90 tablet 3  . Multiple Vitamins-Minerals (MULTIVITAMIN WITH MINERALS) tablet Take 1 tablet by mouth daily.     No current facility-administered medications for this visit.    Allergies-reviewed and updated No Known Allergies  Social History   Socioeconomic History  . Marital status: Married    Spouse name: brenda  . Number of children: 1  . Years of education: 26  . Highest education level: High school graduate  Occupational History  . Occupation: Loss adjuster, chartered: [employer]  Tobacco Use  . Smoking status: Former Smoker    Packs/day: 0.50    Years: 20.00    Pack years: 10.00    Types: Cigarettes    Quit date: 02/12/1984    Years since  quitting: 36.0  . Smokeless tobacco: Never Used  Substance and Sexual Activity  . Alcohol use: No    Alcohol/week: 0.0 standard drinks    Comment: social use  . Drug use: No  . Sexual activity: Not on file  Other Topics Concern  . Not on file  Social History Narrative  . Not on file   Social Determinants of Health   Financial Resource Strain: Not on file  Food Insecurity: Not on file  Transportation Needs: Not on file  Physical Activity: Not on file  Stress: Not on file  Social Connections: Not on file        Objective:  Physical Exam: BP 114/67   Pulse (!) 56   Temp 98.1 F (36.7 C) (Temporal)   Ht 5\' 9"  (1.753 m)   Wt 153 lb (69.4 kg)   SpO2 99%   BMI 22.59 kg/m   Body mass index is 22.59 kg/m. Wt Readings from Last 3 Encounters:  02/15/20 153 lb (69.4 kg)  08/04/19 151  lb 9.6 oz (68.8 kg)  06/08/19 153 lb 3.2 oz (69.5 kg)   Gen: NAD, resting comfortably HEENT: TMs normal bilaterally. OP clear. No thyromegaly noted.  CV: RRR with no murmurs appreciated Pulm: NWOB, CTAB with no crackles, wheezes, or rhonchi GI: Normal bowel sounds present. Soft, Nontender, Nondistended. MSK: no edema, cyanosis, or clubbing noted Skin: warm, dry Neuro: CN2-12 grossly intact. Strength 5/5 in upper and lower extremities. Reflexes symmetric and intact bilaterally.  Psych: Normal affect and thought content     Jamilette Suchocki M. Jerline Pain, MD 02/15/2020 10:18 AM

## 2020-02-15 NOTE — Assessment & Plan Note (Addendum)
Does not qualify for low dose CT lung cancer screening. Will check CXR per patient request.

## 2020-02-15 NOTE — Assessment & Plan Note (Signed)
We will need to check BMET next blood draw.

## 2020-02-15 NOTE — Progress Notes (Signed)
Please inform patient of the following:  Chest xray is clear.  Jeff Graves. Jimmey Ralph, MD 02/15/2020 2:49 PM

## 2020-02-16 ENCOUNTER — Encounter: Payer: Self-pay | Admitting: Physician Assistant

## 2020-02-16 NOTE — Progress Notes (Unsigned)
Cardiology Office Note    Date:  02/18/2020   ID:  Jeff Graves, DOB [birth year], MRN [id number]  PCP:  Vivi Barrack, MD  Cardiologist:  Lauree Chandler, MD  Electrophysiologist:  Virl Axe, MD   Chief Complaint: f/u CAD, arrhythmias  History of Present Illness:   Jeff Graves is a 75 y.o. male with history of atrial tachycardia, CAD s/p CABG 11/2018 with post-op PTX and PAF (briefly on amiodarone), HTN, HLD, CKD, sinus bradycardia, mild stage IIIa by labs who presents for routine follow-up. He remotely followed with Dr. Caryl Comes for his atrial tachycardia and was on flecainide for a period of time, stopped due to abnormal stress test. He was transitioned to Ranexa at that time. In 2020 he developed exertional angina and cath demonstrated multivessel disease, leading to CABG. EF was normal. Post-surgery course notable for post-op pneumothorax and atrial fib. He was on amiodarone for a period of time, discontinued when seen in outpatient follow-up. Last OV 07/2019 and doing well.  He is seen back for follow-up doing well without any angina or dyspnea. He is still going to the gym every day and doing cardio without functional limitation. He has noticed a change in his HR recently by [employer]. Usually when he does physical exertion his HR will get up to roughly 90bpm but lately he's been noticing towards the end of exercise it can be in the 140s. He does not have any symptoms with this. It comes down normally gradually when he completes the exercise. When resting he reports his HR tends to be in the 40s-50s (similar to 01/2019 EKG when HR was 47) - he can feel his heart "beating" during this but when he puts his hand on his chest it feels normal. No recent med changes, illness or other symptoms with this. He is pending wisdom tooth extraction per his report.  Labwork independently reviewed: 01/2019 LFTs with mildly elevated ALT 47, LDL 37 11/2018 Hgb 7.7, K 4.5, Cr 1.26   Past  Medical History:  Diagnosis Date  . Allergy    seasonal  . Atrial tachycardia ([place]) 06/04/2012   12/13-QRS duration-102 ms 6/14-QRS duration-98 ms   . CAD (coronary artery disease)    a. s/p CABG 11/2018  . DDD (degenerative disc disease)   . Diverticulosis of colon   . Hemorrhoids   . Hypercholesterolemia   . Hypertension   . PAC (premature atrial contraction)   . Pneumothorax   . Postoperative atrial fibrillation ([place])   . Renal calculus   . RENAL CALCULUS, HX OF 12/26/2006   Qualifier: Diagnosis of  By: June Leap    . S/P rotator cuff surgery 04/15/2014  . Shoulder pain   . Sinus bradycardia     Past Surgical History:  Procedure Laterality Date  . COLONOSCOPY    . CORONARY ARTERY BYPASS GRAFT N/A 11/23/2018   Procedure: CORONARY ARTERY BYPASS GRAFTING (CABG), ON PUMP, TIMES FOUR, LIMA to LAD, SEQ SVG to PDA, PLVB, SVG to CIRCUMFLEX, USING LEFT INTERNAL MAMMARY ARTERY AND RIGHT GREATER SAPHENOUS VEIN HARVESTED ENDOSCOPICALLY AND LEFT GREATER SAPHENOUS VEIN;  Surgeon: [name], MD;  Location: [facility];  Service: Open Heart Surgery;  Laterality: N/A;  . LEFT HEART CATH AND CORONARY ANGIOGRAPHY N/A 11/18/2018   Procedure: LEFT HEART CATH AND CORONARY ANGIOGRAPHY;  Surgeon: Burnell Blanks, MD;  Location: [facility];  Service: Cardiovascular;  Laterality: N/A;  . None    . SHOULDER SURGERY Left 03/29/2014  .  TEE WITHOUT CARDIOVERSION N/A 11/23/2018   Procedure: TRANSESOPHAGEAL ECHOCARDIOGRAM (TEE);  Surgeon: Delight Ovens, MD;  Location: [facility] OR;  Service: Open Heart Surgery;  Laterality: N/A;    Current Medications: Current Meds  Medication Sig  . aspirin EC 81 MG tablet Take 81 mg by mouth daily. Swallow whole.  [name] atorvastatin (LIPITOR) 80 MG tablet Take 1 tablet (80 mg total) by mouth daily at 6 PM.  . Cholecalciferol (VITAMIN D) 2000 units CAPS Take 2,000 Units by mouth daily.  . Cyanocobalamin (VITAMIN B-12) 5000 MCG LOZG Take 5,000 mcg by mouth  daily.   [name] loratadine (CLARITIN) 10 MG tablet Take 10 mg by mouth daily as needed for allergies.   . metoprolol tartrate (LOPRESSOR) 25 MG tablet Take 0.5 tablets (12.5 mg total) by mouth 2 (two) times daily.  . Multiple Vitamins-Minerals (MULTIVITAMIN WITH MINERALS) tablet Take 1 tablet by mouth daily.      Allergies:   Patient has no known allergies.   Social History   Socioeconomic History  . Marital status: Married    Spouse name: brenda  . Number of children: 1  . Years of education: 49  . Highest education level: High school graduate  Occupational History  . Occupation: Loss adjuster, chartered: [employer]  Tobacco Use  . Smoking status: Former Smoker    Packs/day: 0.50    Years: 20.00    Pack years: 10.00    Types: Cigarettes    Quit date: 02/12/1984    Years since quitting: 36.0  . Smokeless tobacco: Never Used  Substance and Sexual Activity  . Alcohol use: No    Alcohol/week: 0.0 standard drinks    Comment: social use  . Drug use: No  . Sexual activity: Not on file  Other Topics Concern  . Not on file  Social History Narrative  . Not on file   Social Determinants of Health   Financial Resource Strain: Not on file  Food Insecurity: Not on file  Transportation Needs: Not on file  Physical Activity: Not on file  Stress: Not on file  Social Connections: Not on file     Family History:  The patient's family history includes Breast cancer in his sister; Heart attack (age of onset: 33) in his father; Lymphoma in his sister; Valvular heart disease (age of onset: 43) in his mother. There is no history of Colon cancer, Esophageal cancer, Rectal cancer, or Stomach cancer.  ROS:   Please see the history of present illness. All other systems are reviewed and otherwise negative.    EKGs/Labs/Other Studies Reviewed:    Studies reviewed are outlined and summarized above. Reports included below if pertinent.  TEE 11/2018 POST-OP IMPRESSIONS  -  Left Ventricle: The left ventricle is unchanged from pre-bypass.  - Right Ventricle: The right ventricle appears unchanged from pre-bypass.  - Aorta: The aorta appears unchanged from pre-bypass.  - Left Atrium: The left atrium appears unchanged from pre-bypass.  - Left Atrial Appendage: The left atrial appendage appears unchanged from  pre-bypass.  - Aortic Valve: The aortic valve appears unchanged from pre-bypass.  - Mitral Valve: The mitral valve appears unchanged from pre-bypass.  - Tricuspid Valve: The tricuspid valve appears unchanged from pre-bypass.  - Interatrial Septum: The interatrial septum appears unchanged from  pre-bypass.  - Interventricular Septum: The interventricular septum appears unchanged  from  pre-bypass.  - Pericardium: The pericardium appears unchanged from pre-bypass.   SUMMARY    Post Bypass:    -  LVEF > 50% (CO > 5 L/min, CI > 2.5) w/ Dopamine gtt  - Mild improvement of RWMA's  - Tricuspid, Pulmonic, Mitral and Aortic valve unchanged  - No aortic dissection noted after cannula removed  PRE-OP FINDINGS  Left Ventricle: The left ventricle has normal systolic function, with an  ejection fraction of 55-60%. The cavity size was normal. There is mildly  increased left ventricular wall thickness.   Right Ventricle: The right ventricle has normal systolic function. The  cavity was normal. There is increased right ventricular wall thickness.   Left Atrium: Left atrial size was normal in size. The left atrial  appendage is well visualized and there is no evidence of thrombus present.  Left atrial appendage velocity is normal at greater than 40 cm/s.   Right Atrium: Right atrial size was normal in size. Right atrial pressure  is estimated at 10 mmHg.   Interatrial Septum: No atrial level shunt detected by color flow Doppler.   Pericardium: There is no evidence of pericardial effusion. There is no  evidence of cardiac tamponade. There is no pleural  effusion.   Mitral Valve: The mitral valve is normal in structure. Mild thickening of  the mitral valve leaflet. Mild calcification of the mitral valve leaflet.  Mitral valve regurgitation is mild by color flow Doppler. The MR jet is  centrally-directed. There is no  evidence of mitral valve vegetation.   Tricuspid Valve: The tricuspid valve was normal in structure. Tricuspid  valve regurgitation was not visualized by color flow Doppler. No TV  vegetation was visualized.   Aortic Valve: The aortic valve is tricuspid There is Mild thickening of  the aortic valve Aortic valve regurgitation is trivial by color flow  Doppler. There is no evidence of aortic valve stenosis. There is no  evidence of a vegetation on the aortic valve.   Pulmonic Valve: The pulmonic valve was normal in structure No evidence of  pumonic stenosis.  Pulmonic valve regurgitation is trivial by color flow Doppler.    Aorta: The aortic root, ascending aorta and aortic arch are normal in size  and structure. There is evidence of protruding and layered plaque in the  descending aorta; Grade II, measuring 2-54mm in size.   2D Echo 11/2018 1. Left ventricular ejection fraction, by visual estimation, is 60 to  65%. The left ventricle has normal function. Normal left ventricular size.  Left ventricular septal wall thickness was mildly increased. There is  mildly increased left ventricular  hypertrophy.  2. Global right ventricle has normal systolic function.The right  ventricular size is normal. No increase in right ventricular wall  thickness.  3. Left atrial size was normal.  4. Right atrial size was normal.  5. The mitral valve is normal in structure. Mild mitral valve  regurgitation. No evidence of mitral stenosis.  6. The tricuspid valve is normal in structure. Tricuspid valve  regurgitation was not visualized by color flow Doppler.  7. The aortic valve is tricuspid Aortic valve regurgitation is mild by   color flow Doppler. Mild aortic valve sclerosis without stenosis.  8. The pulmonic valve was normal in structure. Pulmonic valve  regurgitation is not visualized by color flow Doppler.  9. The inferior vena cava is normal in size with greater than 50%  respiratory variability, suggesting right atrial pressure of 3 mmHg.   Cardiac Cath 11/2018  Prox RCA to Dist RCA lesion is 40% stenosed.  Dist RCA lesion is 60% stenosed.  RPAV lesion is 95% stenosed.  Prox Cx lesion is 80% stenosed.  Ost LAD to Prox LAD lesion is 99% stenosed.  Prox LAD to Mid LAD lesion is 80% stenosed.   1. Severe triple vessel CAD with heavily calcified vessels.  2. Severe stenosis ostial and mid LAD 3. Severe stenosis proximal circumflex 4. Severe stenosis large caliber posterolateral artery.   Recommendations: Will consult CT surgery for CABG given severe three vessel CAD in heavily calcified coronary arteries. Continue ASA and statin.       EKG:  EKG is ordered today, personally reviewed, demonstrating SB 57bpm, nonspecific STT changes similar to prior - TWI avL, ST sagging V5-V6 without change from 01/2019  Recent Labs: No results found for requested labs within last 8760 hours.  Recent Lipid Panel    Component Value Date/Time   CHOL 129 02/03/2019 0728   TRIG 31 02/03/2019 0728   HDL 83 02/03/2019 0728   CHOLHDL 1.6 02/03/2019 0728   CHOLHDL 2.0 11/20/2018 0519   VLDL 4 11/20/2018 0519   LDLCALC 37 02/03/2019 0728    PHYSICAL EXAM:    VS:  BP 130/90   Pulse (!) 57   Ht 5\' 9"  (1.753 m)   Wt 152 lb 9.6 oz (69.2 kg)   SpO2 96%   BMI 22.54 kg/m   BMI: Body mass index is 22.54 kg/m.  GEN: Well nourished, well developed WM, in no acute distress HEENT: normocephalic, atraumatic Neck: no JVD, carotid bruits, or masses Cardiac: RRR (borderline bradycardic); no murmurs, rubs, or gallops, no edema  Respiratory:  clear to auscultation bilaterally, normal work of breathing GI: soft,  nontender, nondistended, + BS MS: no deformity or atrophy Skin: warm and dry, no rash Neuro:  Alert and Oriented x 3, Strength and sensation are intact, follows commands Psych: euthymic mood, full affect  Wt Readings from Last 3 Encounters:  02/18/20 152 lb 9.6 oz (69.2 kg)  02/15/20 153 lb (69.4 kg)  08/04/19 151 lb 9.6 oz (68.8 kg)     ASSESSMENT & PLAN:   1. CAD s/p CABG - clinically doing well without angina. EKG stable. Continue present regimen. See below regarding further evaluation. He also notes he is pending wisdom tooth extraction soon due to an impacted wisdom tooth giving him trouble. Given clinical stability I feel he is at low risk to proceed with this. Simple dental extractions are considered low risk procedures per guidelines. It is also generally accepted that for simple extractions and dental cleanings, there is no need to interrupt blood thinner therapy. SBE is not required for this patient. Will also update CBC, CMET, lipid profile today.  2. Palpitations, with prior history of atrial tachycardia remotely as well as post-op atrial fib - HR variation noted on Apple watch recently. He is otherwise largely asymptomatic. EKG shows baseline sinus bradycardia without acute change from prior. He is not sure he will have symptoms in a 3 day timeframe so we will plan on a 7 day Zio patch. In addition to labs above, plan thyroid and Mg check as well. He denies any other recent physiologic stressors or absence from exercise to explain the change.  3. Essential HTN - blood pressure marginally above goal but was 114/67 by primary care 3 days prior. We need a better idea of what it's running at home. The patient was provided instructions on monitoring blood pressure at home and notifying our office if tending to notice readings >130/80.  4. HLD goal LDL <70 - tolerating statin. Check CMET/lipid profile today. He  is fasting.  Disposition: F/u with me virtually in 6-8 weeks.  Medication  Adjustments/Labs and Tests Ordered: Current medicines are reviewed at length with the patient today.  Concerns regarding medicines are outlined above. Medication changes, Labs and Tests ordered today are summarized above and listed in the Patient Instructions accessible in Encounters.   Signed, Charlie Pitter, PA-C  02/18/2020 8:49 AM    [address], [place], [place]  [address] Phone: [phone]; Fax: [phone]

## 2020-02-18 ENCOUNTER — Ambulatory Visit: Payer: PPO | Admitting: Physician Assistant

## 2020-02-18 ENCOUNTER — Telehealth: Payer: Self-pay | Admitting: *Deleted

## 2020-02-18 ENCOUNTER — Ambulatory Visit (INDEPENDENT_AMBULATORY_CARE_PROVIDER_SITE_OTHER): Payer: PPO

## 2020-02-18 ENCOUNTER — Encounter: Payer: Self-pay | Admitting: Physician Assistant

## 2020-02-18 ENCOUNTER — Other Ambulatory Visit: Payer: Self-pay

## 2020-02-18 ENCOUNTER — Encounter: Payer: Self-pay | Admitting: Radiology

## 2020-02-18 VITALS — BP 130/90 | HR 57 | Ht 69.0 in | Wt 152.6 lb

## 2020-02-18 DIAGNOSIS — R002 Palpitations: Secondary | ICD-10-CM | POA: Diagnosis not present

## 2020-02-18 DIAGNOSIS — I2510 Atherosclerotic heart disease of native coronary artery without angina pectoris: Secondary | ICD-10-CM | POA: Diagnosis not present

## 2020-02-18 DIAGNOSIS — Z951 Presence of aortocoronary bypass graft: Secondary | ICD-10-CM

## 2020-02-18 DIAGNOSIS — I10 Essential (primary) hypertension: Secondary | ICD-10-CM

## 2020-02-18 DIAGNOSIS — I4891 Unspecified atrial fibrillation: Secondary | ICD-10-CM

## 2020-02-18 DIAGNOSIS — I9789 Other postprocedural complications and disorders of the circulatory system, not elsewhere classified: Secondary | ICD-10-CM

## 2020-02-18 DIAGNOSIS — I471 Supraventricular tachycardia: Secondary | ICD-10-CM

## 2020-02-18 DIAGNOSIS — E785 Hyperlipidemia, unspecified: Secondary | ICD-10-CM

## 2020-02-18 LAB — LIPID PANEL
Chol/HDL Ratio: 1.6 ratio (ref 0.0–5.0)
Cholesterol, Total: 140 mg/dL (ref 100–199)
HDL: 87 mg/dL (ref >39)
LDL Chol Calc (NIH): 45 mg/dL (ref 0–99)
Triglycerides: 24 mg/dL (ref 0–149)
VLDL Cholesterol Cal: 8 mg/dL (ref 5–40)

## 2020-02-18 LAB — COMPREHENSIVE METABOLIC PANEL
ALT: 26 IU/L (ref 0–44)
AST: 24 IU/L (ref 0–40)
Albumin/Globulin Ratio: 1.7 (ref 1.2–2.2)
Albumin: 4 g/dL (ref 3.7–4.7)
Alkaline Phosphatase: 96 IU/L (ref 44–121)
BUN/Creatinine Ratio: 14 (ref 10–24)
BUN: 18 mg/dL (ref 8–27)
Bilirubin Total: 0.6 mg/dL (ref 0.0–1.2)
CO2: 24 mmol/L (ref 20–29)
Calcium: 9 mg/dL (ref 8.6–10.2)
Chloride: 102 mmol/L (ref 96–106)
Creatinine, Ser: 1.25 mg/dL (ref 0.76–1.27)
GFR calc Af Amer: 66 mL/min/{1.73_m2} (ref >59)
GFR calc non Af Amer: 57 mL/min/{1.73_m2} — ABNORMAL LOW (ref >59)
Globulin, Total: 2.3 g/dL (ref 1.5–4.5)
Glucose: 88 mg/dL (ref 65–99)
Potassium: 4.6 mmol/L (ref 3.5–5.2)
Sodium: 137 mmol/L (ref 134–144)
Total Protein: 6.3 g/dL (ref 6.0–8.5)

## 2020-02-18 LAB — CBC
Hematocrit: 40.2 % (ref 37.5–51.0)
Hemoglobin: 13.5 g/dL (ref 13.0–17.7)
MCH: 32 pg (ref 26.6–33.0)
MCHC: 33.6 g/dL (ref 31.5–35.7)
MCV: 95 fL (ref 79–97)
Platelets: 239 10*3/uL (ref 150–450)
RBC: 4.22 x10E6/uL (ref 4.14–5.80)
RDW: 12.9 % (ref 11.6–15.4)
WBC: 6.1 10*3/uL (ref 3.4–10.8)

## 2020-02-18 LAB — TSH: TSH: 2.99 u[IU]/mL (ref 0.450–4.500)

## 2020-02-18 LAB — MAGNESIUM: Magnesium: 2.1 mg/dL (ref 1.6–2.3)

## 2020-02-18 NOTE — Telephone Encounter (Signed)
  Patient Consent for Virtual Visit         Jeff Graves has provided verbal consent on 02/18/2020 for a virtual visit (video or telephone).   CONSENT FOR VIRTUAL VISIT FOR:  Jeff Graves  By participating in this virtual visit I agree to the following:  I hereby voluntarily request, consent and authorize [facility] and its employed or contracted physicians, physician assistants, nurse practitioners or other licensed health care professionals (the Practitioner), to provide me with telemedicine health care services (the "Services") as deemed necessary by the treating Practitioner. I acknowledge and consent to receive the Services by the Practitioner via telemedicine. I understand that the telemedicine visit will involve communicating with the Practitioner through live audiovisual communication technology and the disclosure of certain medical information by electronic transmission. I acknowledge that I have been given the opportunity to request an in-person assessment or other available alternative prior to the telemedicine visit and am voluntarily participating in the telemedicine visit.  I understand that I have the right to withhold or withdraw my consent to the use of telemedicine in the course of my care at any time, without affecting my right to future care or treatment, and that the Practitioner or I may terminate the telemedicine visit at any time. I understand that I have the right to inspect all information obtained and/or recorded in the course of the telemedicine visit and may receive copies of available information for a reasonable fee.  I understand that some of the potential risks of receiving the Services via telemedicine include:  [name] Delay or interruption in medical evaluation due to technological equipment failure or disruption; . Information transmitted may not be sufficient (e.g. poor resolution of images) to allow for appropriate medical decision making by the  Practitioner; and/or  . In rare instances, security protocols could fail, causing a breach of personal health information.  Furthermore, I acknowledge that it is my responsibility to provide information about my medical history, conditions and care that is complete and accurate to the best of my ability. I acknowledge that Practitioner's advice, recommendations, and/or decision may be based on factors not within their control, such as incomplete or inaccurate data provided by me or distortions of diagnostic images or specimens that may result from electronic transmissions. I understand that the practice of medicine is not an exact science and that Practitioner makes no warranties or guarantees regarding treatment outcomes. I acknowledge that a copy of this consent can be made available to me via my patient portal ([facility]), or I can request a printed copy by calling the office of [place].    I understand that my insurance will be billed for this visit.   I have read or had this consent read to me. . I understand the contents of this consent, which adequately explains the benefits and risks of the Services being provided via telemedicine.  . I have been provided ample opportunity to ask questions regarding this consent and the Services and have had my questions answered to my satisfaction. . I give my informed consent for the services to be provided through the use of telemedicine in my medical care

## 2020-02-18 NOTE — Progress Notes (Signed)
Enrolled patient for a 7 day Zio XT monitor to be mailed to patients home.  

## 2020-02-18 NOTE — Patient Instructions (Addendum)
Medication Instructions:  Your physician recommends that you continue on your current medications as directed. Please refer to the Current Medication list given to you today.  *If you need a refill on your cardiac medications before your next appointment, please call your pharmacy*   Lab Work: TODAY:  CMET, CBC, TSH, MAG, & LIPID  If you have labs (blood work) drawn today and your tests are completely normal, you will receive your results only by:  [place] (if you have MyChart) OR  A paper copy in the mail If you have any lab test that is abnormal or we need to change your treatment, we will call you to review the results.   Testing/Procedures: [place] [place]- Long Term Monitor Instructions   Your physician has requested you wear your ZIO patch monitor 7 days.   This is a single patch monitor.  [employer] supplies one patch monitor per enrollment.  Additional stickers are not available.   Please do not apply patch if you will be having a Nuclear Stress Test, Echocardiogram, Cardiac CT, MRI, or Chest Xray during the time frame you would be wearing the monitor. The patch cannot be worn during these tests.  You cannot remove and re-apply the ZIO XT patch monitor.   Your ZIO patch monitor will be sent USPS Priority mail from [facility] directly to your home address. The monitor may also be mailed to a PO BOX if home delivery is not available.   It may take 3-5 days to receive your monitor after you have been enrolled.   Once you have received you monitor, please review enclosed instructions.  Your monitor has already been registered assigning a specific monitor serial # to you.   Applying the monitor   Shave hair from upper left chest.   Hold abrader disc by orange tab.  Rub abrader in 40 strokes over left upper chest as indicated in your monitor instructions.   Clean area with 4 enclosed alcohol pads .  Use all pads to assure are is cleaned thoroughly.  Let dry.   Apply  patch as indicated in monitor instructions.  Patch will be place under collarbone on left side of chest with arrow pointing upward.   Rub patch adhesive wings for 2 minutes.Remove white label marked "1".  Remove white label marked "2".  Rub patch adhesive wings for 2 additional minutes.   While looking in a mirror, press and release button in center of patch.  A small green light will flash 3-4 times .  This will be your only indicator the monitor has been turned on.     Do not shower for the first 24 hours.  You may shower after the first 24 hours.   Press button if you feel a symptom. You will hear a small click.  Record Date, Time and Symptom in the Patient Log Book.   When you are ready to remove patch, follow instructions on last 2 pages of Patient Log Book.  Stick patch monitor onto last page of Patient Log Book.   Place Patient Log Book in [place] box.  Use locking tab on box and tape box closed securely.  The [employer] and [employer] has [employer] on it.  Please place in mailbox as soon as possible.  Your physician should have your test results approximately 7 days after the monitor has been mailed back to [facility].   Call [place] at [phone] if you have questions regarding your ZIO XT patch  monitor.  Call them immediately if you see an orange light blinking on your monitor.   If your monitor falls off in less than 4 days contact our Monitor department at [phone].  If your monitor becomes loose or falls off after 4 days call [employer] at [phone] for suggestions on securing your monitor.     Follow-Up: At [facility], you and your health needs are our priority.  As part of our continuing mission to provide you with exceptional heart care, we have created designated Provider Care Teams.  These Care Teams include your primary Cardiologist (physician) and Advanced Practice Providers (APPs -  Physician Assistants and Nurse Practitioners) who all  work together to provide you with the care you need, when you need it.  We recommend signing up for the patient portal called "MyChart".  Sign up information is provided on this After Visit Summary.  MyChart is used to connect with patients for Virtual Visits (Telemedicine).  Patients are able to view lab/test results, encounter notes, upcoming appointments, etc.  Non-urgent messages can be sent to your provider as well.   To learn more about what you can do with MyChart, go to [email or web address].    Your next appointment:   6 -8 WEEKS   03/31/2020 8:15  The format for your next appointment:   In Person  Provider:   You may see Melina Copa, PA-C or one of the following Advanced Practice Providers on your designated Care Team:      Other Instructions Your blood pressure was running a little high in the office today so I would recommend checking it at home over the next 4-5 days. I would recommend using a blood pressure cuff that goes on your arm. The wrist ones can be inaccurate. If possible, try to select one that also reports your heart rate. To check your blood pressure, choose a time at least 3 hours after taking your blood pressure medicines. If you can sample it at different times of the day, that's great - it might give you more information about how your blood pressure fluctuates. Remain seated in a chair for 5 minutes quietly beforehand, then check it.Call us if you tend to get readings of greater than 130 on the top number or 80 on the bottom number.   Your [facility] team (Cardiologist [Heart Doctor] and Advanced Practice Provider [phone] Assistant; Nurse Practitioner]) has arranged for your next office appointment to be a virtual visit (also known as "Telehealth", "Telemedicine", "E-Visit").   We now offer virtual visits for all our patients.  This helps [place] to expand our ability to see patients in a timely and safe manner.  These visits are billed to your insurance just like  traditional, in person, appointments.  Please review this IMPORTANT information about your upcoming appointment.   **PLEASE READ THE SECTION BELOW LABELED "CONSENT".**   **CALL OUR OFFICE WITH QUESTIONS.**   WHAT YOU NEED FOR YOUR VIRTUAL VISIT:  You will need a SmartPhone with microphone and video capability.  [If you are using MyChart to connect to your visit, it is also possible to use a desktop/laptop computer (with an Internet connection), as long as you have microphone and video capability.]  You will need to use Chrome, Edge or [employer] as your Wellsite geologist.  We highly recommend that you have a MyChart account as this will make connecting to your visit seamless.  A MyChart account not only allows you to connect to your provider for a virtual  visit, but also allows you to see the results of all your tests, provider notes, medications and upcoming appointments.    A MyChart account is not absolutely necessary.  We can still complete your visit if you do not have one.    If you do not have a computer or SmartPhone with video/microphone capability or your Internet/cell service is weak on the day of your visit, we will do your visit by telephone.    A blood pressure cuff and scale are essential to collect your vital signs at home.  If you do not have these and you are unable to obtain them, please contact our office so that we can make arrangements for you.  If you have a pulse oximeter, Apple watch, Kardia mobile device, etc, you can collect data from these devices as well to share with the provider for your visit.  These devices are not required for a virtual visit.    WHAT TO DO ON THE DAY OF YOUR APPOINTMENT: 30 minutes before your appointment:  Take your blood pressure, pulse or heart rate (if your blood pressure machine is able to collect it) and weight.  Write all these numbers down so you can give it to the nurse or medical assistant that calls.  Get all of the medications  you currently take and put them where you will be sitting for the appointment.  The nurse/medical assistant will go over these with you when he/she calls.  15 minutes before your appointment:  You will receive a phone call from a nurse or medical assistant from our office.  The caller ID on your phone may indicate that the caller is either "[facility]" or "[place]".  However, the number may come across as spam.  Please turn off any spam blocker so you do not miss the call.  The nurse will:  Ask for your blood pressure, pulse, weight, height.  Go over all of your medications to make sure your chart is correct.  Review your allergies, smoking history, reason for appointment, etc.   Give you instructions on how to connect with the video platform or telephone.  IF YOUR VISIT IS BY TELEPHONE ONLY: After the nurse finishes getting you ready for the visit, your provider will call you on the phone number you provide to [place].   TO CONNECT WITH YOUR PROVIDER FOR YOUR APPOINTMENT (BY VIDEO): You will either connect with the provider with your MyChart account or (if you are not using MyChart) with a link sent to your SmartPhone by text message.  If you are using MyChart, see below.  If you are not using MyChart, the nurse will send you the text message during the phone call.     If you are connecting with your MyChart account:   (The nurse that calls you will tell you when to do this):  You will log into your account. At the top of your home screen, you should see the following prompt that tells you to "Begin your video visit with . . . ".  Click the green button (BEGIN VISIT).    The next screen will say "It's time to start your video visit!" Click the green button (BEGIN VIDEO VISIT)    There may be a screen that appears that asks for permission to use your camera and/or microphone.  Click ALLOW.  This will open the browser where your appointment will take place.   You may see the  message: "Welcome.  Waiting for  the call to begin."  Or, you will see that the nurse or your provider are already "in" the room waiting on you.   If you are connecting with a link sent to your SmartPhone via text: The nurse that calls you will send the link by text message. Click on the link (it should look something like this):     If asked to give permission to use the camera and or microphone, click ALLOW This will open the browser where your appointment will take place.      You may see the message: "Welcome.  Waiting for the call to begin."  Or, you will see that the nurse or your provider are already "in" the room waiting on you.    The controls for your visit look like the picture below.  Please note that this is what the microphone and camera look like when they are ON.  If muted, they will have a line through them.    After the appointment: Once your provider leaves the appointment, he/she will go over instructions with the nurse/medical assistant.  If needed, this person will call you with any instructions, appointments, etc.   A copy of your After Visit Summary (AVS) will be available later that day in your MyChart account.  This document will have all of your instructions, medications, appointments, etc.  If you are not using MyChart, we will mail it to your home.     *CONSENT FOR TELE-HEALTH VISIT - PLEASE REVIEW* By participating in the scheduled virtual visit (and any virtual visit scheduled within 365 days of the printing of this document), I agree to the following:   I hereby voluntarily request, consent and authorize [facility] and its employed or contracted physicians, physician assistants, nurse practitioners or other licensed health care professionals (the Practitioner), to provide me with telemedicine health care services (the Services") as deemed necessary by the treating Practitioner. I acknowledge and consent to receive the Services by the Practitioner via  telemedicine. I understand that the telemedicine visit will involve communicating with the Practitioner through live audiovisual communication technology and the disclosure of certain medical information by electronic transmission. I acknowledge that I have been given the opportunity to request an in-person assessment or other available alternative prior to the telemedicine visit and am voluntarily participating in the telemedicine visit.  I understand that I have the right to withhold or withdraw my consent to the use of telemedicine in the course of my care at any time, without affecting my right to future care or treatment, and that the Practitioner or I may terminate the telemedicine visit at any time. I understand that I have the right to inspect all information obtained and/or recorded in the course of the telemedicine visit and may receive copies of available information for a reasonable fee.  I understand that some of the potential risks of receiving the Services via telemedicine include:   Delay or interruption in medical evaluation due to technological equipment failure or disruption;  Information transmitted may not be sufficient (e.g. poor resolution of images) to allow for appropriate medical decision making by the Practitioner; and/or   In rare instances, security protocols could fail, causing a breach of personal health information.  Furthermore, I acknowledge that it is my responsibility to provide information about my medical history, conditions and care that is complete and accurate to the best of my ability. I acknowledge that Practitioner's advice, recommendations, and/or decision may be based on factors not within their  control, such as incomplete or inaccurate data provided by me or distortions of diagnostic images or specimens that may result from electronic transmissions. I understand that the practice of medicine is not an exact science and that Practitioner makes no warranties or  guarantees regarding treatment outcomes. I acknowledge that a copy of this consent can be made available to me via my patient portal ([place]), or I can request a printed copy by calling the office of [place].    I understand that my insurance will be billed for this visit.   I have read or had this consent read to me.  I understand the contents of this consent, which adequately explains the benefits and risks of the Services being provided via telemedicine.   I have been provided ample opportunity to ask questions regarding this consent and the Services and have had my questions answered to my satisfaction.  I give my informed consent for the services to be provided through the use of telemedicine in my medical care

## 2020-02-24 DIAGNOSIS — D3709 Neoplasm of uncertain behavior of other specified sites of the oral cavity: Secondary | ICD-10-CM | POA: Diagnosis not present

## 2020-02-26 ENCOUNTER — Other Ambulatory Visit: Payer: Self-pay | Admitting: Cardiovascular Disease

## 2020-03-02 ENCOUNTER — Telehealth: Payer: Self-pay

## 2020-03-02 NOTE — Telephone Encounter (Signed)
Labs are all stable.  Algis Greenhouse. Jerline Pain, MD 03/02/2020 4:08 PM

## 2020-03-02 NOTE — Telephone Encounter (Signed)
See below, labs were already reviewed with pt by someone.

## 2020-03-02 NOTE — Telephone Encounter (Signed)
Pt is requesting Dr. Jerline Pain look at his labs that were taken on the 7th and tell him what they say and his opinion.

## 2020-03-02 NOTE — Telephone Encounter (Signed)
Date of birth verified by patient  Lab results given,Pt verbalized understanding   

## 2020-03-05 DIAGNOSIS — Z951 Presence of aortocoronary bypass graft: Secondary | ICD-10-CM | POA: Diagnosis not present

## 2020-03-05 DIAGNOSIS — I4891 Unspecified atrial fibrillation: Secondary | ICD-10-CM | POA: Diagnosis not present

## 2020-03-05 DIAGNOSIS — E785 Hyperlipidemia, unspecified: Secondary | ICD-10-CM

## 2020-03-05 DIAGNOSIS — I10 Essential (primary) hypertension: Secondary | ICD-10-CM

## 2020-03-05 DIAGNOSIS — I2510 Atherosclerotic heart disease of native coronary artery without angina pectoris: Secondary | ICD-10-CM | POA: Diagnosis not present

## 2020-03-05 DIAGNOSIS — R002 Palpitations: Secondary | ICD-10-CM

## 2020-03-05 DIAGNOSIS — I9789 Other postprocedural complications and disorders of the circulatory system, not elsewhere classified: Secondary | ICD-10-CM

## 2020-03-05 DIAGNOSIS — I471 Supraventricular tachycardia: Secondary | ICD-10-CM

## 2020-03-17 DIAGNOSIS — Z951 Presence of aortocoronary bypass graft: Secondary | ICD-10-CM | POA: Diagnosis not present

## 2020-03-17 DIAGNOSIS — I471 Supraventricular tachycardia: Secondary | ICD-10-CM | POA: Diagnosis not present

## 2020-03-17 DIAGNOSIS — I9789 Other postprocedural complications and disorders of the circulatory system, not elsewhere classified: Secondary | ICD-10-CM | POA: Diagnosis not present

## 2020-03-17 DIAGNOSIS — I2510 Atherosclerotic heart disease of native coronary artery without angina pectoris: Secondary | ICD-10-CM | POA: Diagnosis not present

## 2020-03-21 ENCOUNTER — Telehealth: Payer: Self-pay | Admitting: *Deleted

## 2020-03-21 DIAGNOSIS — I471 Supraventricular tachycardia: Secondary | ICD-10-CM

## 2020-03-21 NOTE — Telephone Encounter (Signed)
-----   Message from Charlie Pitter, [place] sent at 03/17/2020  5:01 PM EST ----- Hi triage. Please let pt know monitor overall looked good, but a few findings - he had 2 brief episodes of fast HR from the bottom of his heart that were very short, max 7 beats. He did have several short runs of fast HR from the top of his heart as well, longest lasting only 10 beats. He had rare skips. Reviewed with Dr. Angelena Form.  None of these are anything we are particularly concerned about needing to change any medicines acutely since he had been feeling well lately otherwise. Electrolytes looked great. Let's please get a 2d echocardiogram to assess for any structural changes predisposing him to these brief rhythm changes and we'll review this in follow-up. Can we make sure his followup is after his echo? The followup can be virtual - was scheduled for 2/18 but not sure we'll have echo back by then so move out if needed.  (Documentation purposes only for reminder to self: Dr. Angelena Form said "He is already on a beta blocker.  I would probably continue that. Given some bradycardia, I would not increase the beta blocker dose".)

## 2020-03-21 NOTE — Telephone Encounter (Signed)
Spoke with pt and reviewed results and recommendations.  Pt agreeable to plan.  Echo ordered and OV moved to March.

## 2020-03-30 DIAGNOSIS — M5459 Other low back pain: Secondary | ICD-10-CM | POA: Diagnosis not present

## 2020-03-30 DIAGNOSIS — M25551 Pain in right hip: Secondary | ICD-10-CM | POA: Diagnosis not present

## 2020-03-31 ENCOUNTER — Telehealth: Payer: PPO | Admitting: Physician Assistant

## 2020-04-07 DIAGNOSIS — M5459 Other low back pain: Secondary | ICD-10-CM | POA: Diagnosis not present

## 2020-04-07 DIAGNOSIS — M25551 Pain in right hip: Secondary | ICD-10-CM | POA: Diagnosis not present

## 2020-04-08 ENCOUNTER — Other Ambulatory Visit: Payer: Self-pay | Admitting: Cardiovascular Disease

## 2020-04-14 ENCOUNTER — Other Ambulatory Visit: Payer: Self-pay

## 2020-04-14 ENCOUNTER — Ambulatory Visit (HOSPITAL_COMMUNITY): Payer: PPO | Attending: Cardiology

## 2020-04-14 DIAGNOSIS — I471 Supraventricular tachycardia: Secondary | ICD-10-CM | POA: Insufficient documentation

## 2020-04-14 LAB — ECHOCARDIOGRAM COMPLETE
Area-P 1/2: 1.4 cm2
P 1/2 time: 702 msec
S' Lateral: 3.2 cm

## 2020-04-24 ENCOUNTER — Encounter: Payer: Self-pay | Admitting: Physician Assistant

## 2020-04-24 NOTE — Progress Notes (Signed)
Cardiology Office Note    Date:  04/26/2020   ID:  Jeff Graves, DOB December 02, 1945, MRN [id number]  PCP:  Jeff Barrack, MD  Cardiologist:  Jeff Chandler, MD  Electrophysiologist:  Jeff Axe, MD   Chief Complaint: f/u monitor  History of Present Illness:   Jeff Graves is a 75 y.o. male with history of atrial tachycardia, CAD s/p CABG 11/2018 with post-op PTX and PAF (briefly on amiodarone), HTN, HLD, CKD stage II-IIIa, sinus bradycardia, mild dilation of aorta presents for routine follow-up. He remotely followed with Dr. Caryl Graves for his atrial tachycardia and was on flecainide for a period of time, stopped due to abnormal stress test. He was transitioned to Ranexa at that time. In 2020 he developed exertional angina and cath demonstrated multivessel disease, leading to CABG. EF was normal. Post-surgery course notable for post-op pneumothorax and atrial fib. He was on amiodarone for a period of time, discontinued when seen in outpatient follow-up. I saw him recently 02/2020 for routine follow-up and he was doing well but had noticed higher recent HR variation by his smartwatch towards the end of exercise (up to 140s instead of 90bpm as usual). Zio monitor showed predominantly NSR with 2 episodes NSVT (max 7 beats), 42 runs of SVT (max 10 beats), some episodes were atrial tach with variable block, PACs/PVCs present - PVC burden 2.5%, average HR 61bpm, min HR 41, max 187bpm. Reviewed with Dr. Angelena Graves who suggested continuation of BB. 2D Echo undertaken to ensure stability 04/2020 showed EF 60-65%, mild LVH, mild dilation of aortic root and ascending aorta.   He returns for follow-up overall feeling well. He denies any chest pain, SOB, palpitations, pre-syncope or syncope. He's not had any recent issues seeing high HR on his smartwatch. He is feeling well. He brings in a list of BP readings but they are primarily from January except for one. He was seeing readings 130-150 range at that  time.  Labwork independently reviewed: 02/2020 Mg 2.1, CBC wnl, CMET ok Cr 1.25, K 4.6, LDL 45, TSH wnl   Past Medical History:  Diagnosis Date  . Allergy    seasonal  . Atrial tachycardia ([place]) 06/04/2012   12/13-QRS duration-102 ms 6/14-QRS duration-98 ms   . CAD (coronary artery disease)    a. s/p CABG 11/2018  . CKD (chronic kidney disease), stage II   . DDD (degenerative disc disease)   . Diverticulosis of colon   . Hemorrhoids   . Hypercholesterolemia   . Hypertension   . NSVT (nonsustained ventricular tachycardia) ([place])   . PAC (premature atrial contraction)   . Pneumothorax   . Postoperative atrial fibrillation ([place])   . Renal calculus   . RENAL CALCULUS, HX OF 12/26/2006   Qualifier: Diagnosis of  By: Jeff Graves    . S/P rotator cuff surgery 04/15/2014  . Shoulder pain   . Sinus bradycardia     Past Surgical History:  Procedure Laterality Date  . COLONOSCOPY    . CORONARY ARTERY BYPASS GRAFT N/A 11/23/2018   Procedure: CORONARY ARTERY BYPASS GRAFTING (CABG), ON PUMP, TIMES FOUR, LIMA to LAD, SEQ SVG to PDA, PLVB, SVG to CIRCUMFLEX, USING LEFT INTERNAL MAMMARY ARTERY AND RIGHT GREATER SAPHENOUS VEIN HARVESTED ENDOSCOPICALLY AND LEFT GREATER SAPHENOUS VEIN;  Surgeon: Jeff Isaac, MD;  Location: [name];  Service: Open Heart Surgery;  Laterality: N/A;  . LEFT HEART CATH AND CORONARY ANGIOGRAPHY N/A 11/18/2018   Procedure: LEFT HEART CATH AND CORONARY ANGIOGRAPHY;  Surgeon: Jeff Graves,  Jeff Brod, MD;  Location: [facility];  Service: Cardiovascular;  Laterality: N/A;  . None    . SHOULDER SURGERY Left 03/29/2014  . TEE WITHOUT CARDIOVERSION N/A 11/23/2018   Procedure: TRANSESOPHAGEAL ECHOCARDIOGRAM (TEE);  Surgeon: Jeff Isaac, MD;  Location: [facility];  Service: Open Heart Surgery;  Laterality: N/A;    Current Medications: Current Meds  Medication Sig  . aspirin EC 81 MG tablet Take 81 mg by mouth daily. Swallow whole.  [name] atorvastatin (LIPITOR) 80  MG tablet TAKE ONE TABLET BY MOUTH DAILY AT 6PM  . Cholecalciferol (VITAMIN D) 2000 units CAPS Take 2,000 Units by mouth daily.  . Cyanocobalamin (VITAMIN B-12) 5000 MCG LOZG Take 5,000 mcg by mouth daily.   [name] loratadine (CLARITIN) 10 MG tablet Take 10 mg by mouth daily as needed for allergies.   . metoprolol tartrate (LOPRESSOR) 25 MG tablet TAKE 1/2 TABLET BY MOUTH TWO TIMES A DAY  . Multiple Vitamins-Minerals (MULTIVITAMIN WITH MINERALS) tablet Take 1 tablet by mouth daily.      Allergies:   Patient has no known allergies.   Social History   Socioeconomic History  . Marital status: Married    Spouse name: Jeff Graves  . Number of children: 1  . Years of education: 38  . Highest education level: High school graduate  Occupational History  . Occupation: Chief of Staff: [employer]  Tobacco Use  . Smoking status: Former Smoker    Packs/day: 0.50    Years: 20.00    Pack years: 10.00    Types: Cigarettes    Quit date: 02/12/1984    Years since quitting: 36.2  . Smokeless tobacco: Never Used  Vaping Use  . Vaping Use: Never used  Substance and Sexual Activity  . Alcohol use: No    Alcohol/week: 0.0 standard drinks    Comment: social use  . Drug use: No  . Sexual activity: Not on file  Other Topics Concern  . Not on file  Social History Narrative  . Not on file   Social Determinants of Health   Financial Resource Strain: Not on file  Food Insecurity: Not on file  Transportation Needs: Not on file  Physical Activity: Not on file  Stress: Not on file  Social Connections: Not on file     Family History:  The patient's family history includes Breast cancer in his sister; Heart attack (age of onset: 76) in his father; Lymphoma in his sister; Valvular heart disease (age of onset: 83) in his mother. There is no history of Colon cancer, Esophageal cancer, Rectal cancer, or Stomach cancer.  ROS:   Please see the history of present illness.  All other  systems are reviewed and otherwise negative.    EKGs/Labs/Other Studies Reviewed:    Studies reviewed are outlined and summarized above. Reports included below if pertinent.  2D echo 04/2020 1. Left ventricular ejection fraction, by estimation, is 60 to 65%. The  left ventricle has normal function. The left ventricle has no regional  wall motion abnormalities. There is mild left ventricular hypertrophy.  Left ventricular diastolic parameters  are indeterminate.  2. Right ventricular systolic function is mildly reduced. The right  ventricular size is normal. There is normal pulmonary artery systolic  pressure.  3. Left atrial size was moderately dilated.  4. The mitral valve is normal in structure. Trivial mitral valve  regurgitation.  5. The aortic valve is tricuspid. Aortic valve regurgitation is mild. No  aortic stenosis is present.  6. Aortic dilatation noted. There is mild dilatation of the aortic root,  measuring 40 mm. There is dilatation of the ascending aorta, measuring 41  mm.   Zio Monitor 03/2020 Patient had a min HR of 41 bpm, max HR of 187 bpm, and avg HR of 61 bpm. Predominant underlying rhythm was Sinus Rhythm. 2 Ventricular Tachycardia runs occurred, the run with the fastest interval lasting 4 beats with a max rate of 184 bpm, the longest  lasting 7 beats with an avg rate of 116 bpm. 42 Supraventricular Tachycardia runs occurred, the run with the fastest interval lasting 5 beats with a max rate of 187 bpm, the longest lasting 10 beats with an avg rate of 141 bpm. Some episodes of  Supraventricular Tachycardia may be possible Atrial Tachycardia with variable block. Isolated SVEs were occasional (2.8%, 17067), SVE Couplets were rare (<1.0%, 1638), and SVE Triplets were rare (<1.0%, 236). Isolated VEs were occasional (2.5%, 15535),  VE Couplets were rare (<1.0%, 509), and VE Triplets were rare (<1.0%, 9).  ========== TEE 11/2018 POST-OP IMPRESSIONS  - Left  Ventricle: The left ventricle is unchanged from pre-bypass.  - Right Ventricle: The right ventricle appears unchanged from pre-bypass.  - Aorta: The aorta appears unchanged from pre-bypass.  - Left Atrium: The left atrium appears unchanged from pre-bypass.  - Left Atrial Appendage: The left atrial appendage appears unchanged from  pre-bypass.  - Aortic Valve: The aortic valve appears unchanged from pre-bypass.  - Mitral Valve: The mitral valve appears unchanged from pre-bypass.  - Tricuspid Valve: The tricuspid valve appears unchanged from pre-bypass.  - Interatrial Septum: The interatrial septum appears unchanged from  pre-bypass.  - Interventricular Septum: The interventricular septum appears unchanged  from  pre-bypass.  - Pericardium: The pericardium appears unchanged from pre-bypass.   SUMMARY    Post Bypass:    - LVEF > 50% (CO > 5 L/min, CI > 2.5) w/ Dopamine gtt  - Mild improvement of RWMA's  - Tricuspid, Pulmonic, Mitral and Aortic valve unchanged  - No aortic dissection noted after cannula removed  PRE-OP FINDINGS  Left Ventricle: The left ventricle has normal systolic function, with an  ejection fraction of 55-60%. The cavity size was normal. There is mildly  increased left ventricular wall thickness.   Right Ventricle: The right ventricle has normal systolic function. The  cavity was normal. There is increased right ventricular wall thickness.   Left Atrium: Left atrial size was normal in size. The left atrial  appendage is well visualized and there is no evidence of thrombus present.  Left atrial appendage velocity is normal at greater than 40 cm/s.   Right Atrium: Right atrial size was normal in size. Right atrial pressure  is estimated at 10 mmHg.   Interatrial Septum: No atrial level shunt detected by color flow Doppler.   Pericardium: There is no evidence of pericardial effusion. There is no  evidence of cardiac tamponade. There is no pleural effusion.    Mitral Valve: The mitral valve is normal in structure. Mild thickening of  the mitral valve leaflet. Mild calcification of the mitral valve leaflet.  Mitral valve regurgitation is mild by color flow Doppler. The MR jet is  centrally-directed. There is no  evidence of mitral valve vegetation.   Tricuspid Valve: The tricuspid valve was normal in structure. Tricuspid  valve regurgitation was not visualized by color flow Doppler. No TV  vegetation was visualized.   Aortic Valve: The aortic valve  is tricuspid There is Mild thickening of  the aortic valve Aortic valve regurgitation is trivial by color flow  Doppler. There is no evidence of aortic valve stenosis. There is no  evidence of a vegetation on the aortic valve.   Pulmonic Valve: The pulmonic valve was normal in structure No evidence of  pumonic stenosis.  Pulmonic valve regurgitation is trivial by color flow Doppler.    Aorta: The aortic root, ascending aorta and aortic arch are normal in size  and structure. There is evidence of protruding and layered plaque in the  descending aorta; Grade II, measuring 2-52mm in size.   2D Echo 11/2018 1. Left ventricular ejection fraction, by visual estimation, is 60 to  65%. The left ventricle has normal function. Normal left ventricular size.  Left ventricular septal wall thickness was mildly increased. There is  mildly increased left ventricular  hypertrophy.  2. Global right ventricle has normal systolic function.The right  ventricular size is normal. No increase in right ventricular wall  thickness.  3. Left atrial size was normal.  4. Right atrial size was normal.  5. The mitral valve is normal in structure. Mild mitral valve  regurgitation. No evidence of mitral stenosis.  6. The tricuspid valve is normal in structure. Tricuspid valve  regurgitation was not visualized by color flow Doppler.  7. The aortic valve is tricuspid Aortic valve regurgitation is mild by  color  flow Doppler. Mild aortic valve sclerosis without stenosis.  8. The pulmonic valve was normal in structure. Pulmonic valve  regurgitation is not visualized by color flow Doppler.  9. The inferior vena cava is normal in size with greater than 50%  respiratory variability, suggesting right atrial pressure of 3 mmHg.   Cardiac Cath 11/2018  Prox RCA to Dist RCA lesion is 40% stenosed.  Dist RCA lesion is 60% stenosed.  RPAV lesion is 95% stenosed.  Prox Cx lesion is 80% stenosed.  Ost LAD to Prox LAD lesion is 99% stenosed.  Prox LAD to Mid LAD lesion is 80% stenosed.  1. Severe triple vessel CAD with heavily calcified vessels.  2. Severe stenosis ostial and mid LAD 3. Severe stenosis proximal circumflex 4. Severe stenosis large caliber posterolateral artery.   Recommendations: Will consult CT surgery for CABG given severe three vessel CAD in heavily calcified coronary arteries. Continue ASA and statin.      EKG:  EKG is not ordered  Recent Labs: 02/18/2020: ALT 26; BUN 18; Creatinine, Ser 1.25; Hemoglobin 13.5; Magnesium 2.1; Platelets 239; Potassium 4.6; Sodium 137; TSH 2.990  Recent Lipid Panel    Component Value Date/Time   CHOL 140 02/18/2020 0857   TRIG 24 02/18/2020 0857   HDL 87 02/18/2020 0857   CHOLHDL 1.6 02/18/2020 0857   CHOLHDL 2.0 11/20/2018 0519   VLDL 4 11/20/2018 0519   LDLCALC 45 02/18/2020 0857    PHYSICAL EXAM:    VS:  BP 132/80 Comment: left arm  Pulse (!) 54   Ht 5\' 10"  (1.778 m)   Wt 154 lb (69.9 kg)   SpO2 98%   BMI 22.10 kg/m   BMI: Body mass index is 22.1 kg/m.  GEN: Well nourished, well developed male in no acute distress HEENT: normocephalic, atraumatic Neck: no JVD, carotid bruits, or masses Cardiac: RRR; no murmurs, rubs, or gallops, no edema  Respiratory:  clear to auscultation bilaterally, normal work of breathing GI: soft, nontender, nondistended, + BS MS: no deformity or atrophy Skin: warm and dry, no rash Neuro:  Alert and Oriented x 3, Strength and sensation are intact, follows commands Psych: euthymic mood, full affect  Wt Readings from Last 3 Encounters:  04/26/20 154 lb (69.9 kg)  02/18/20 152 lb 9.6 oz (69.2 kg)  02/15/20 153 lb (69.4 kg)     ASSESSMENT & PLAN:   1. PSVT/atrial tachycardia - noted in brief bursts by event monitor but he is totally asymptomatic. He has not noticed any recent unusual HR readings by SmartWatch. Per discussion with Dr. Angelena Graves, would continue current beta blocker dose and observe for any development of symptoms. Warning sx reviewed.  2. Brief NSVT - LVEF was normal and no recent anginal symptoms. Electrolytes OK. Per d/w Dr. Angelena Graves, continue beta blocker.  3. CAD with HLD goal LDL <70 - he is exercising without anginal symptoms. Continue ASA, BB, statin.  4. Essential HTN - he brings in readings that show SBPs 130-150s, but these were from January primarily. He will check his pressure over the next few days and relay results to [place] on Monday for review. If he needs additional BP control I would choose amlodipine to start.  5. Dilation of aorta - discussed finding with patient. Anticipate repeat echo would be helpful in 04/2021. This can be arranged at his next follow-up. Continue plan for BP control as above.  Disposition: F/u with Dr. Angelena Graves in 6 months.   Medication Adjustments/Labs and Tests Ordered: Current medicines are reviewed at length with the patient today.  Concerns regarding medicines are outlined above. Medication changes, Labs and Tests ordered today are summarized above and listed in the Patient Instructions accessible in Encounters.   Signed, Charlie Pitter, PA-C  04/26/2020 2:45 PM    [facility] [address], [place], [place]  [address] Phone: [phone]; Fax: [phone]

## 2020-04-26 ENCOUNTER — Ambulatory Visit: Payer: PPO | Admitting: Physician Assistant

## 2020-04-26 ENCOUNTER — Other Ambulatory Visit: Payer: Self-pay

## 2020-04-26 ENCOUNTER — Encounter: Payer: Self-pay | Admitting: Physician Assistant

## 2020-04-26 VITALS — BP 132/80 | HR 54 | Ht 70.0 in | Wt 154.0 lb

## 2020-04-26 DIAGNOSIS — I10 Essential (primary) hypertension: Secondary | ICD-10-CM | POA: Diagnosis not present

## 2020-04-26 DIAGNOSIS — I471 Supraventricular tachycardia: Secondary | ICD-10-CM | POA: Diagnosis not present

## 2020-04-26 DIAGNOSIS — I4729 Other ventricular tachycardia: Secondary | ICD-10-CM

## 2020-04-26 DIAGNOSIS — I2510 Atherosclerotic heart disease of native coronary artery without angina pectoris: Secondary | ICD-10-CM

## 2020-04-26 DIAGNOSIS — I472 Ventricular tachycardia: Secondary | ICD-10-CM

## 2020-04-26 DIAGNOSIS — I77819 Aortic ectasia, unspecified site: Secondary | ICD-10-CM | POA: Diagnosis not present

## 2020-04-26 NOTE — Patient Instructions (Addendum)
Medication Instructions:  Your physician recommends that you continue on your current medications as directed. Please refer to the Current Medication list given to you today.  *If you need a refill on your cardiac medications before your next appointment, please call your pharmacy*   Lab Work: None ordered  If you have labs (blood work) drawn today and your tests are completely normal, you will receive your results only by: [name] MyChart Message (if you have MyChart) OR . A paper copy in the mail If you have any lab test that is abnormal or we need to change your treatment, we will call you to review the results.   Testing/Procedures: None ordered   Follow-Up: At [facility], you and your health needs are our priority.  As part of our continuing mission to provide you with exceptional heart care, we have created designated Provider Care Teams.  These Care Teams include your primary Cardiologist (physician) and Advanced Practice Providers (APPs -  Physician Assistants and Nurse Practitioners) who all work together to provide you with the care you need, when you need it.  We recommend signing up for the patient portal called "MyChart".  Sign up information is provided on this After Visit Summary.  MyChart is used to connect with patients for Virtual Visits (Telemedicine).  Patients are able to view lab/test results, encounter notes, upcoming appointments, etc.  Non-urgent messages can be sent to your provider as well.   To learn more about what you can do with MyChart, go to [email or web address].    Your next appointment:   6 month(s)  The format for your next appointment:   In Person  Provider:   You may see Lauree Chandler, MD or one of the following Advanced Practice Providers on your designated Care Team:    Melina Copa, PA-C  Ermalinda Barrios, PA-C    Other Instructions Please check your blood pressure twice a day over the next few days and call us on Monday with  report.

## 2020-04-28 DIAGNOSIS — M5416 Radiculopathy, lumbar region: Secondary | ICD-10-CM | POA: Diagnosis not present

## 2020-05-01 ENCOUNTER — Telehealth: Payer: Self-pay | Admitting: Cardiovascular Disease

## 2020-05-01 MED ORDER — AMLODIPINE BESYLATE 5 MG PO TABS: 5.0000 mg | ORAL_TABLET | Freq: Every day | ORAL | 1 refills | Status: DC

## 2020-05-01 NOTE — Telephone Encounter (Signed)
Call placed to pt regarding blood pressure readings below. Pt has been made aware to start Amlodipine 5 mg daily and to continue to monitor hs blood pressure and to send in another log in 1 week. Pt also made aware to remember good blood pressure control comes from healthy diet, decreased salt & alcohol, and healthy exercise an rest.  Pt verbalized understanding.

## 2020-05-01 NOTE — Telephone Encounter (Signed)
Blood pressures are a smidge elevated. Would start amlodipine 5mg  daily and please submit BP readings in 1 week for review. (For documentation sake did not increase BB due to resting bradycardia.) If he would like a formal follow-up in the office to review can also offer HTN clinic visit but I am OK with correspondence this way as long as he is sure he's able to provide the feedback. Also remember the important things we talk about good for BP control - healthy diet, decreasing salt, decreasing alcohol (if using any), healthy exercise and adequate rest. Thanks!

## 2020-05-01 NOTE — Telephone Encounter (Signed)
Pt c/o BP issue: STAT if pt c/o blurred vision, one-sided weakness or slurred speech  1. What are your last 5 BP readings?   3/17: 131/83 65           146/76 54  03/18: 136/82 68  137/73 64  03/19: 144/86 59  140/82 58  03/20: 148/83 52  152/84 56   2. Are you having any other symptoms (ex. Dizziness, headache, blurred vision, passed out)? No   3. What is your BP issue?  Patient is calling to provide his past few BP readings for Jeff Copa, PA.

## 2020-05-08 DIAGNOSIS — M5459 Other low back pain: Secondary | ICD-10-CM | POA: Diagnosis not present

## 2020-05-15 ENCOUNTER — Telehealth: Payer: Self-pay | Admitting: Physician Assistant

## 2020-05-15 NOTE — Telephone Encounter (Signed)
Can you ask patient to provide a few mid day readings this week for review and we will go from there? Thx.

## 2020-05-15 NOTE — Telephone Encounter (Signed)
Pt c/o BP issue: STAT if pt c/o blurred vision, one-sided weakness or slurred speech  1. What are your last 5 BP readings?  05/11/20 134/74 HR 57 8:00 PM 05/12/20 125/68 HR 51 8:00 AM 127/70 HR 59 8:00 PM 05/13/20 131/80 HR 59 9:00 AM 135/68 HR 54 9:00 PM 05/14/20 137/77 HR 54 9:30 AM   2. Are you having any other symptoms (ex. Dizziness, headache, blurred vision, passed out)? No   3. What is your BP issue? Jeff Graves is calling to report his BP readings per Dayna's request.

## 2020-05-15 NOTE — Telephone Encounter (Signed)
Call placed to pt and he will send in some readings from his blood pressure from mid day.

## 2020-05-30 ENCOUNTER — Telehealth: Payer: Self-pay | Admitting: Physician Assistant

## 2020-05-30 NOTE — Telephone Encounter (Signed)
Patient calling with BP readings:  05/15/20: 131/79/62, 1:30 pm 05/17/20:111/71/67, 1:30 pm 05/18/20: 118/77/61, 1:30 pm 05/19/20: 133/79/59, 2:30 pm 05/23/20: 138/79/54, 1:00 pm 05/24/20: 123/75/61, 12:30 pm

## 2020-05-31 MED ORDER — AMLODIPINE BESYLATE 5 MG PO TABS: 7.5000 mg | ORAL_TABLET | Freq: Every day | ORAL | 1 refills | Status: DC

## 2020-05-31 NOTE — Telephone Encounter (Signed)
BPs are good but still some occasional numbers over 130 looking at the last few trends he sent in. Would increase amlodipine ever so slightly to 7.5mg  daily and send BP readings after 4-5 days. If he experiences any persistently low readings (I.e. less than 110) on this dose, can go back to 5mg  but would try the increase for now to try and bring all values <130.

## 2020-05-31 NOTE — Telephone Encounter (Signed)
Patient returning call.

## 2020-05-31 NOTE — Addendum Note (Signed)
Addended by: Truddie Hidden on: 05/31/2020 08:35 AM   Modules accepted: Orders

## 2020-05-31 NOTE — Telephone Encounter (Signed)
Left vm for pt to callback 

## 2020-05-31 NOTE — Telephone Encounter (Signed)
Recommendations reviewed with pt as per Danya Dunn's note.  Pt verbalized understanding and had no additional questions.

## 2020-07-03 ENCOUNTER — Ambulatory Visit: Payer: PPO

## 2020-07-03 DIAGNOSIS — Z0000 Encounter for general adult medical examination without abnormal findings: Secondary | ICD-10-CM

## 2020-07-20 DIAGNOSIS — M5416 Radiculopathy, lumbar region: Secondary | ICD-10-CM | POA: Diagnosis not present

## 2020-07-31 DIAGNOSIS — M5459 Other low back pain: Secondary | ICD-10-CM | POA: Diagnosis not present

## 2020-08-21 DIAGNOSIS — M5416 Radiculopathy, lumbar region: Secondary | ICD-10-CM | POA: Diagnosis not present

## 2020-09-07 DIAGNOSIS — M5416 Radiculopathy, lumbar region: Secondary | ICD-10-CM | POA: Diagnosis not present

## 2020-09-18 DIAGNOSIS — M5416 Radiculopathy, lumbar region: Secondary | ICD-10-CM | POA: Diagnosis not present

## 2020-10-02 DIAGNOSIS — M545 Low back pain, unspecified: Secondary | ICD-10-CM | POA: Diagnosis not present

## 2020-10-02 DIAGNOSIS — M48061 Spinal stenosis, lumbar region without neurogenic claudication: Secondary | ICD-10-CM | POA: Diagnosis not present

## 2020-10-02 DIAGNOSIS — M5136 Other intervertebral disc degeneration, lumbar region: Secondary | ICD-10-CM | POA: Diagnosis not present

## 2020-11-04 IMAGING — DX DG CHEST 1V PORT
1 series · 1 of 1 positions shown · non-contrast
Comparison: One-view chest x-ray 11/22/2018

CLINICAL DATA: CABG.

EXAM:
PORTABLE CHEST 1 VIEW

[chest]
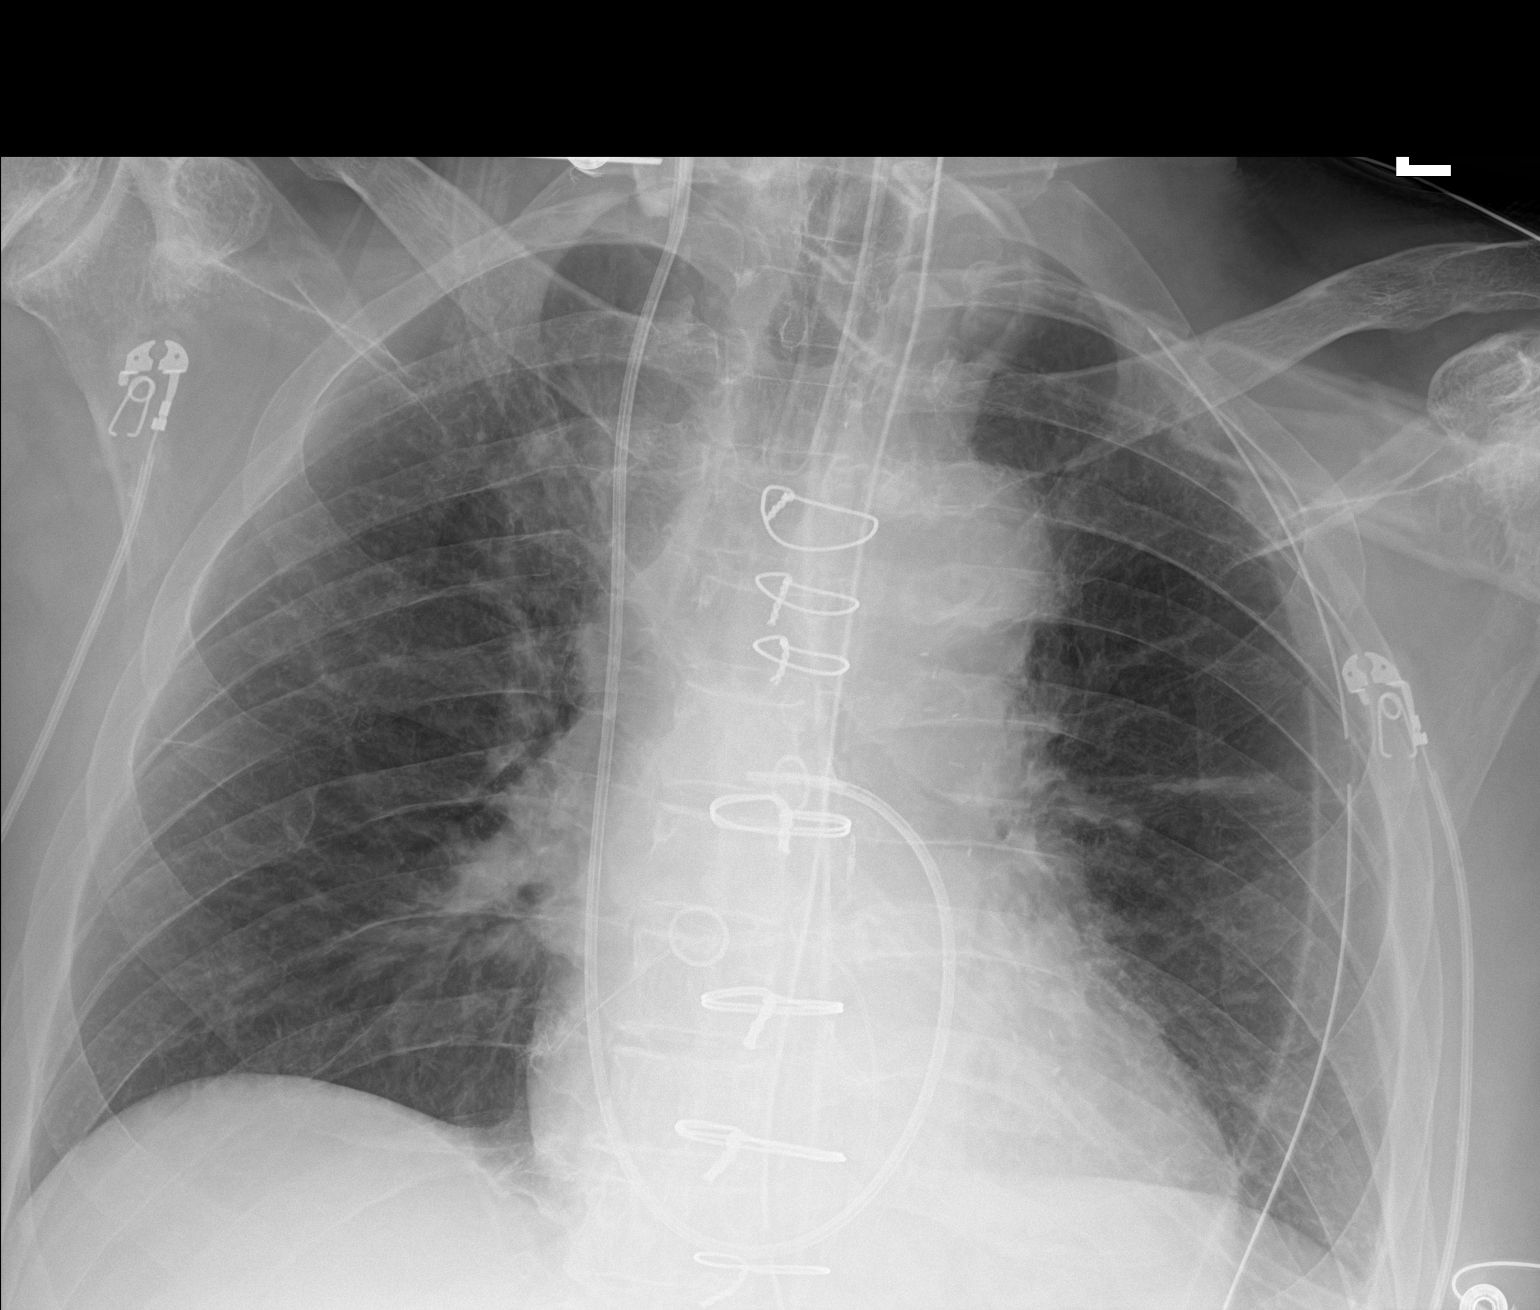

[1 of 1 positions shown; findings below may reference images not displayed]

FINDINGS: Patient is status post median sternotomy for CABG. The patient
remains intubated. Endotracheal tube terminates 4.5 cm above the
carina.

Swan-Ganz catheter entering via right IJ sheath terminates the main
pulmonary outflow tract. Side port of the NG tube is at the GE
junction. Mediastinal drain and left-sided chest tubes are in place.
There is no pneumothorax. There is no edema or effusion. Lung
volumes are low.
IMPRESSION: 1. Status post CABG.
2. Support apparatus in satisfactory position.
3. Low lung volumes.

## 2020-11-06 IMAGING — DX DG CHEST 1V PORT
1 series · 1 of 1 positions shown · non-contrast
Comparison: November 24, 2018.
COMPARISON: November 24, 2018.

Addendum:
CLINICAL DATA: Status post coronary bypass graft.

EXAM:
PORTABLE CHEST 1 VIEW

[chest ap]
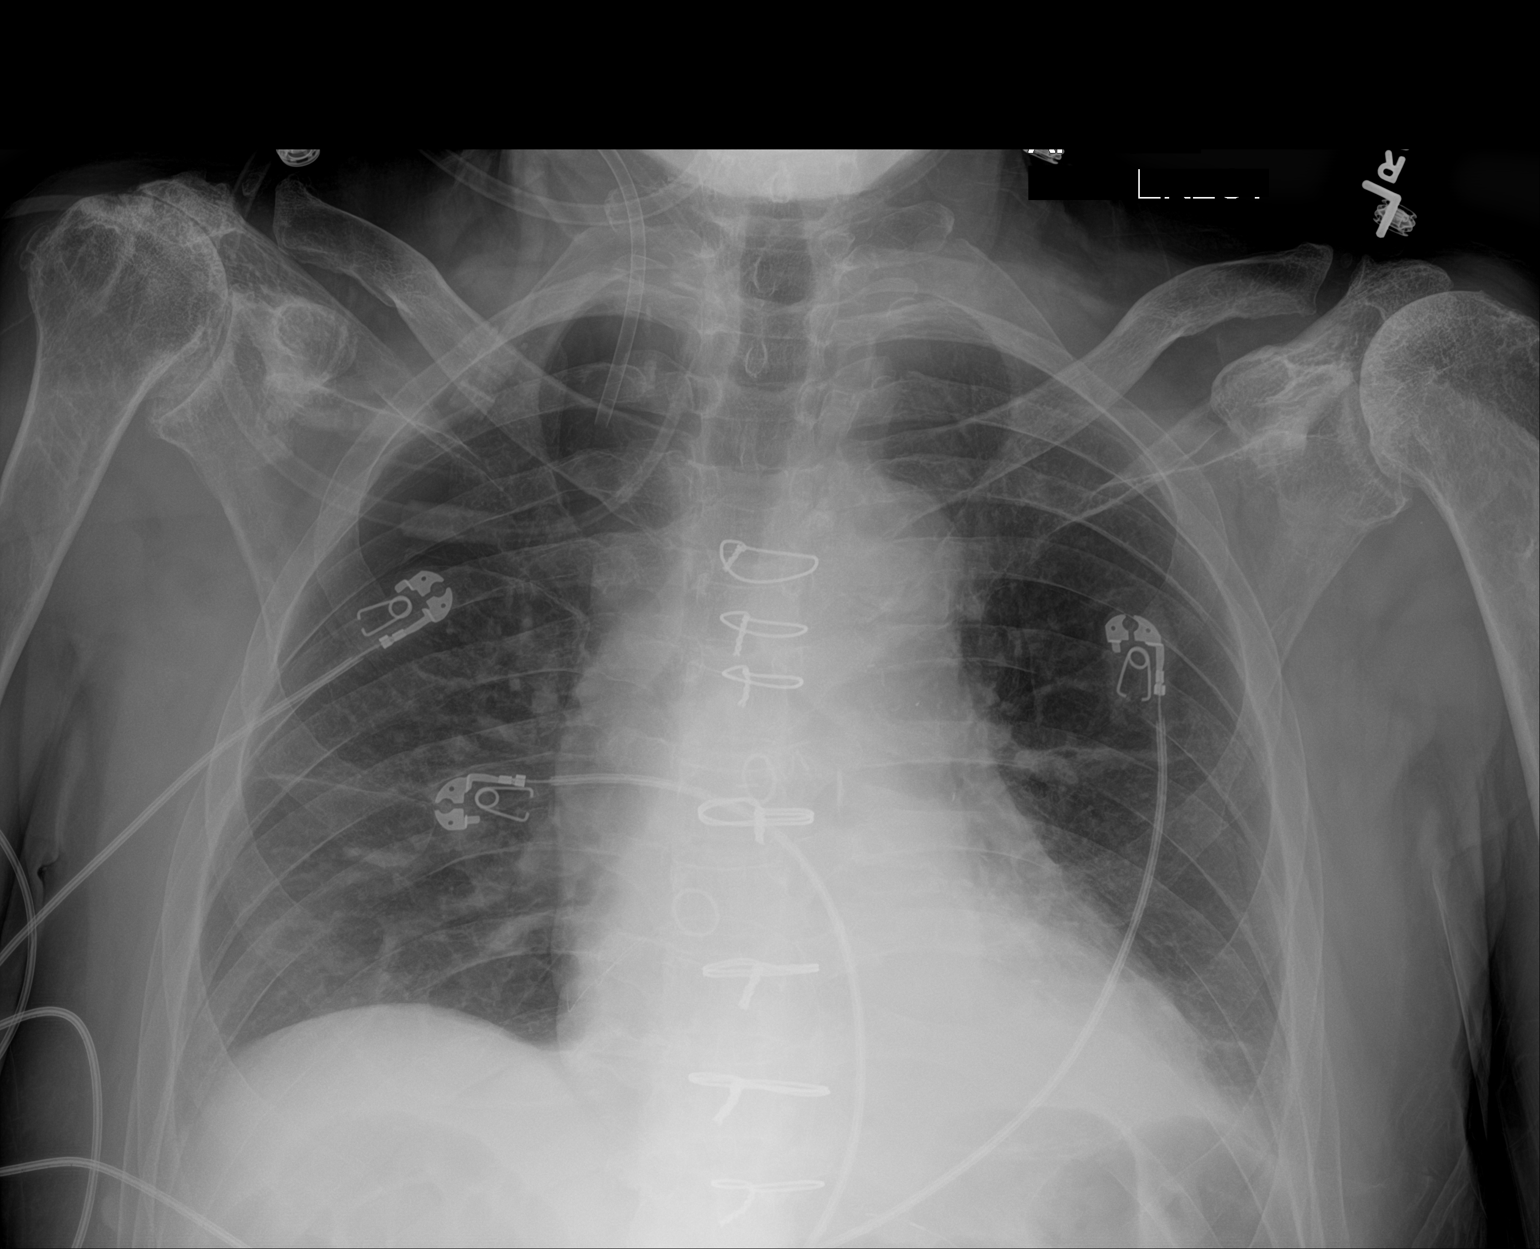

[1 of 1 positions shown; findings below may reference images not displayed]

FINDINGS: Stable cardiomegaly. Status post coronary bypass graft. Mild left
basilar atelectasis is noted with small left pleural effusion. Right
lung is clear. Chest tubes have been removed. Bony thorax is
unremarkable.
IMPRESSION: Mild left basilar atelectasis is noted with small left pleural
effusion. No pneumothorax is noted status post chest tube removal.

ADDENDUM:
Upon further review, moderate size right apical pneumothorax is
noted which is not significantly changed compared to prior exam.
These results were discussed by telephone at the time of
interpretation on 11/25/2018 at [DATE] to provider RIJN RASH
.

*** End of Addendum ***
FINDINGS: Stable cardiomegaly. Status post coronary bypass graft. Mild left
basilar atelectasis is noted with small left pleural effusion. Right
lung is clear. Chest tubes have been removed. Bony thorax is
unremarkable.
IMPRESSION: Mild left basilar atelectasis is noted with small left pleural
effusion. No pneumothorax is noted status post chest tube removal.

## 2020-11-07 IMAGING — CR DG CHEST 2V
1 series · 1 of 1 positions shown · non-contrast
Comparison: November 25, 2018.

CLINICAL DATA: Status post cardiac surgery.

EXAM:
CHEST - 2 VIEW

[chest pa]
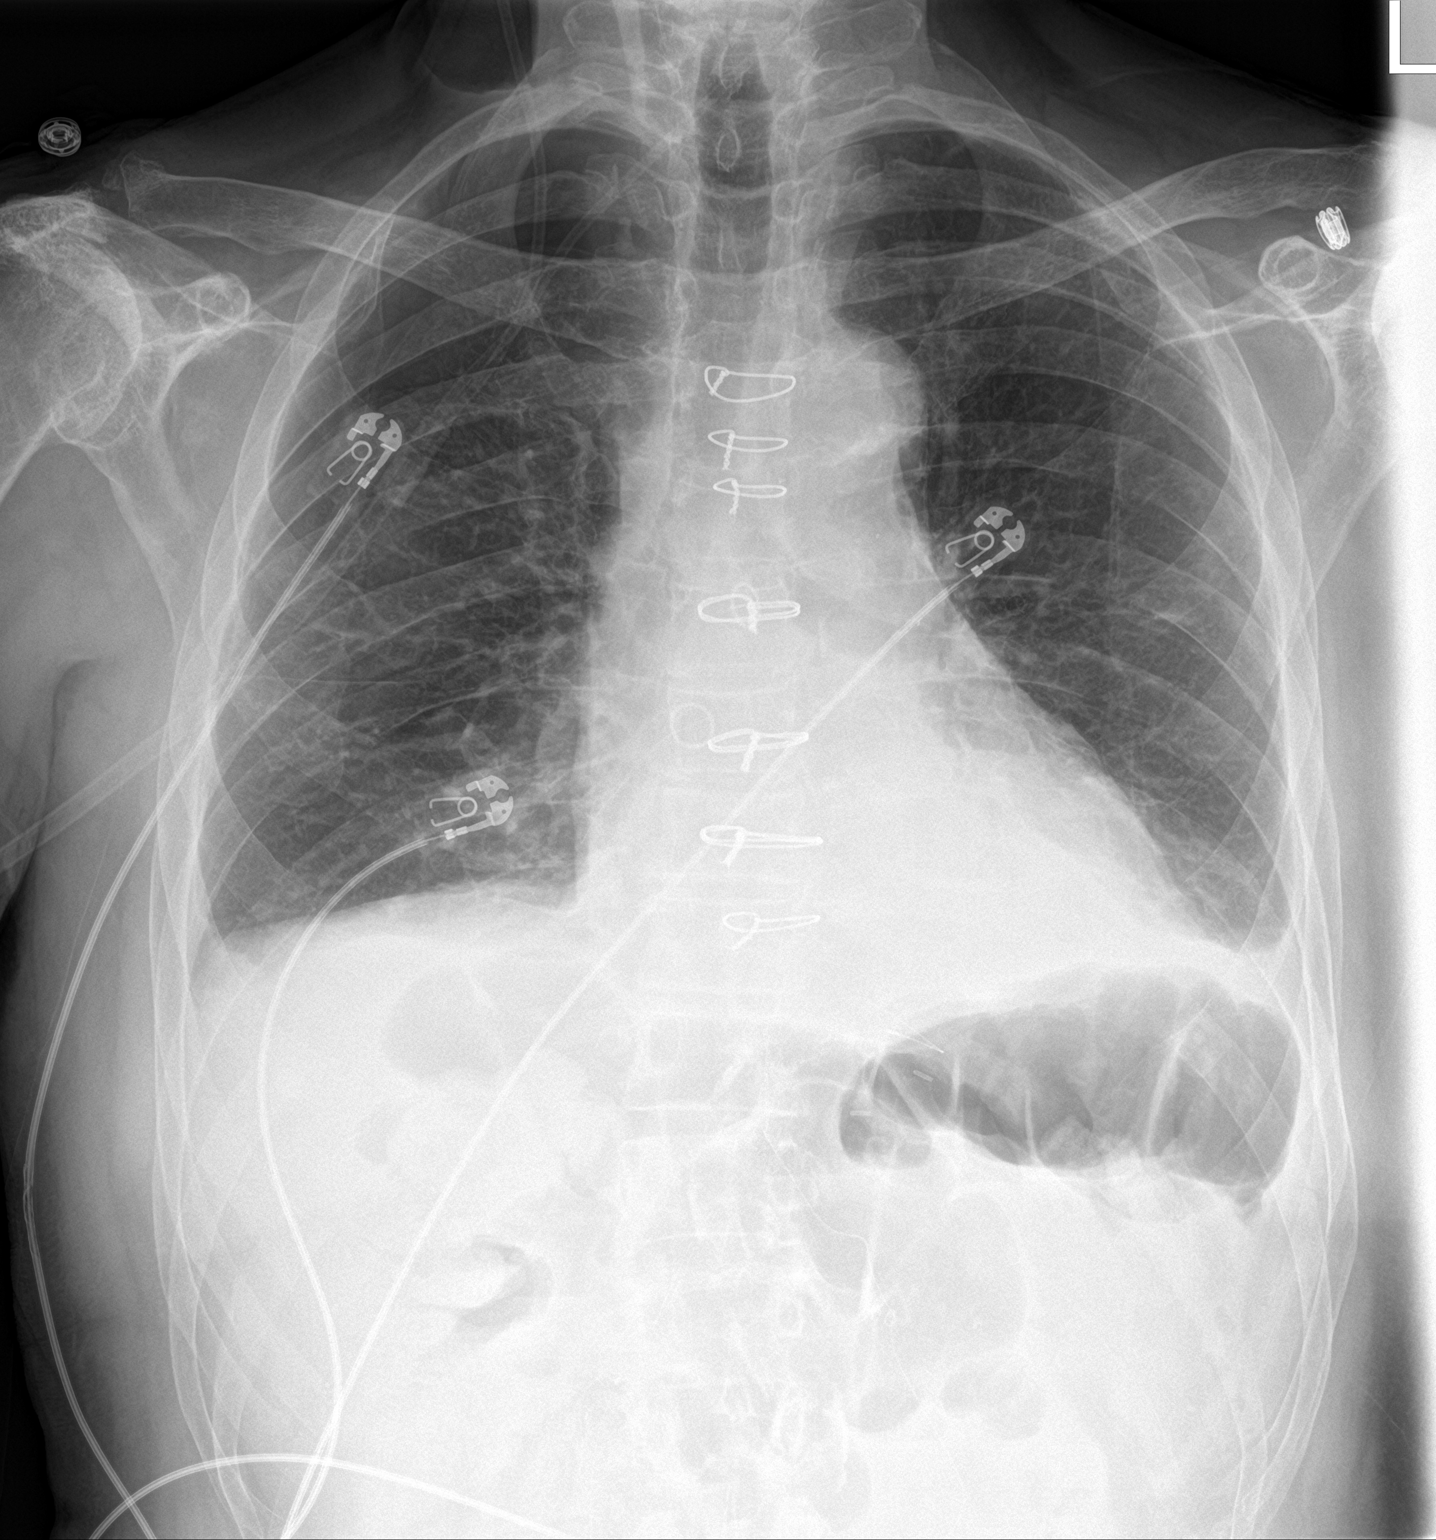

[1 of 1 positions shown; findings below may reference images not displayed]

FINDINGS: Stable cardiomediastinal silhouette. Mild to moderate right
pneumothorax is noted which is slightly improved compared to prior
exam. Stable left basilar atelectasis or infiltrate is noted with
small pleural effusion. Minimal right basilar atelectasis is noted.
Bony thorax is unremarkable.
IMPRESSION: Mild to moderate right pneumothorax is noted which is slightly
improved compared to prior exam. Stable bibasilar atelectasis is
noted, left greater than right.

## 2020-11-08 IMAGING — DX DG CHEST 2V
2 series · 2 of 2 positions shown · non-contrast
Comparison: 11/26/2018

CLINICAL DATA: Postop

EXAM:
CHEST - 2 VIEW

[chest pa]
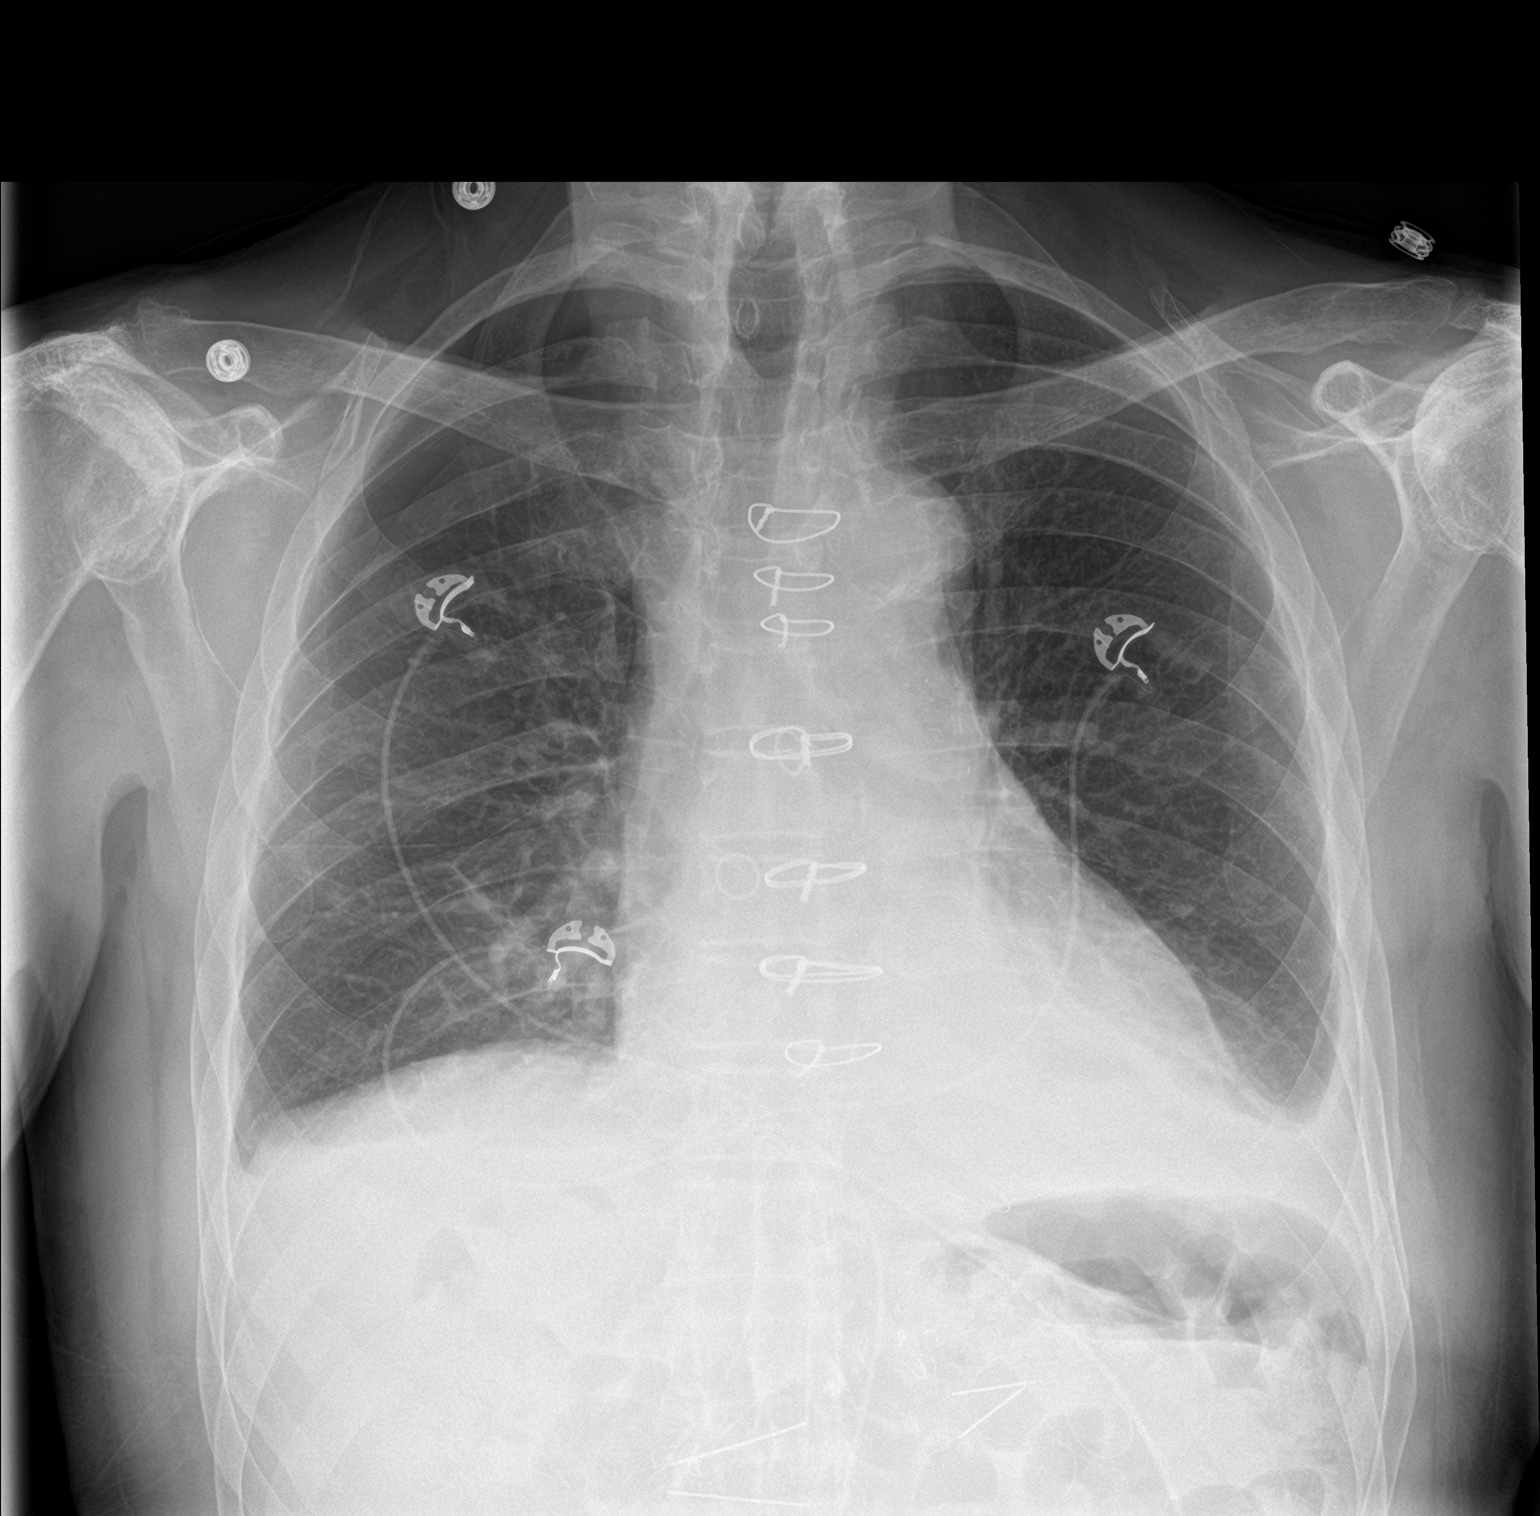

[chest lat]
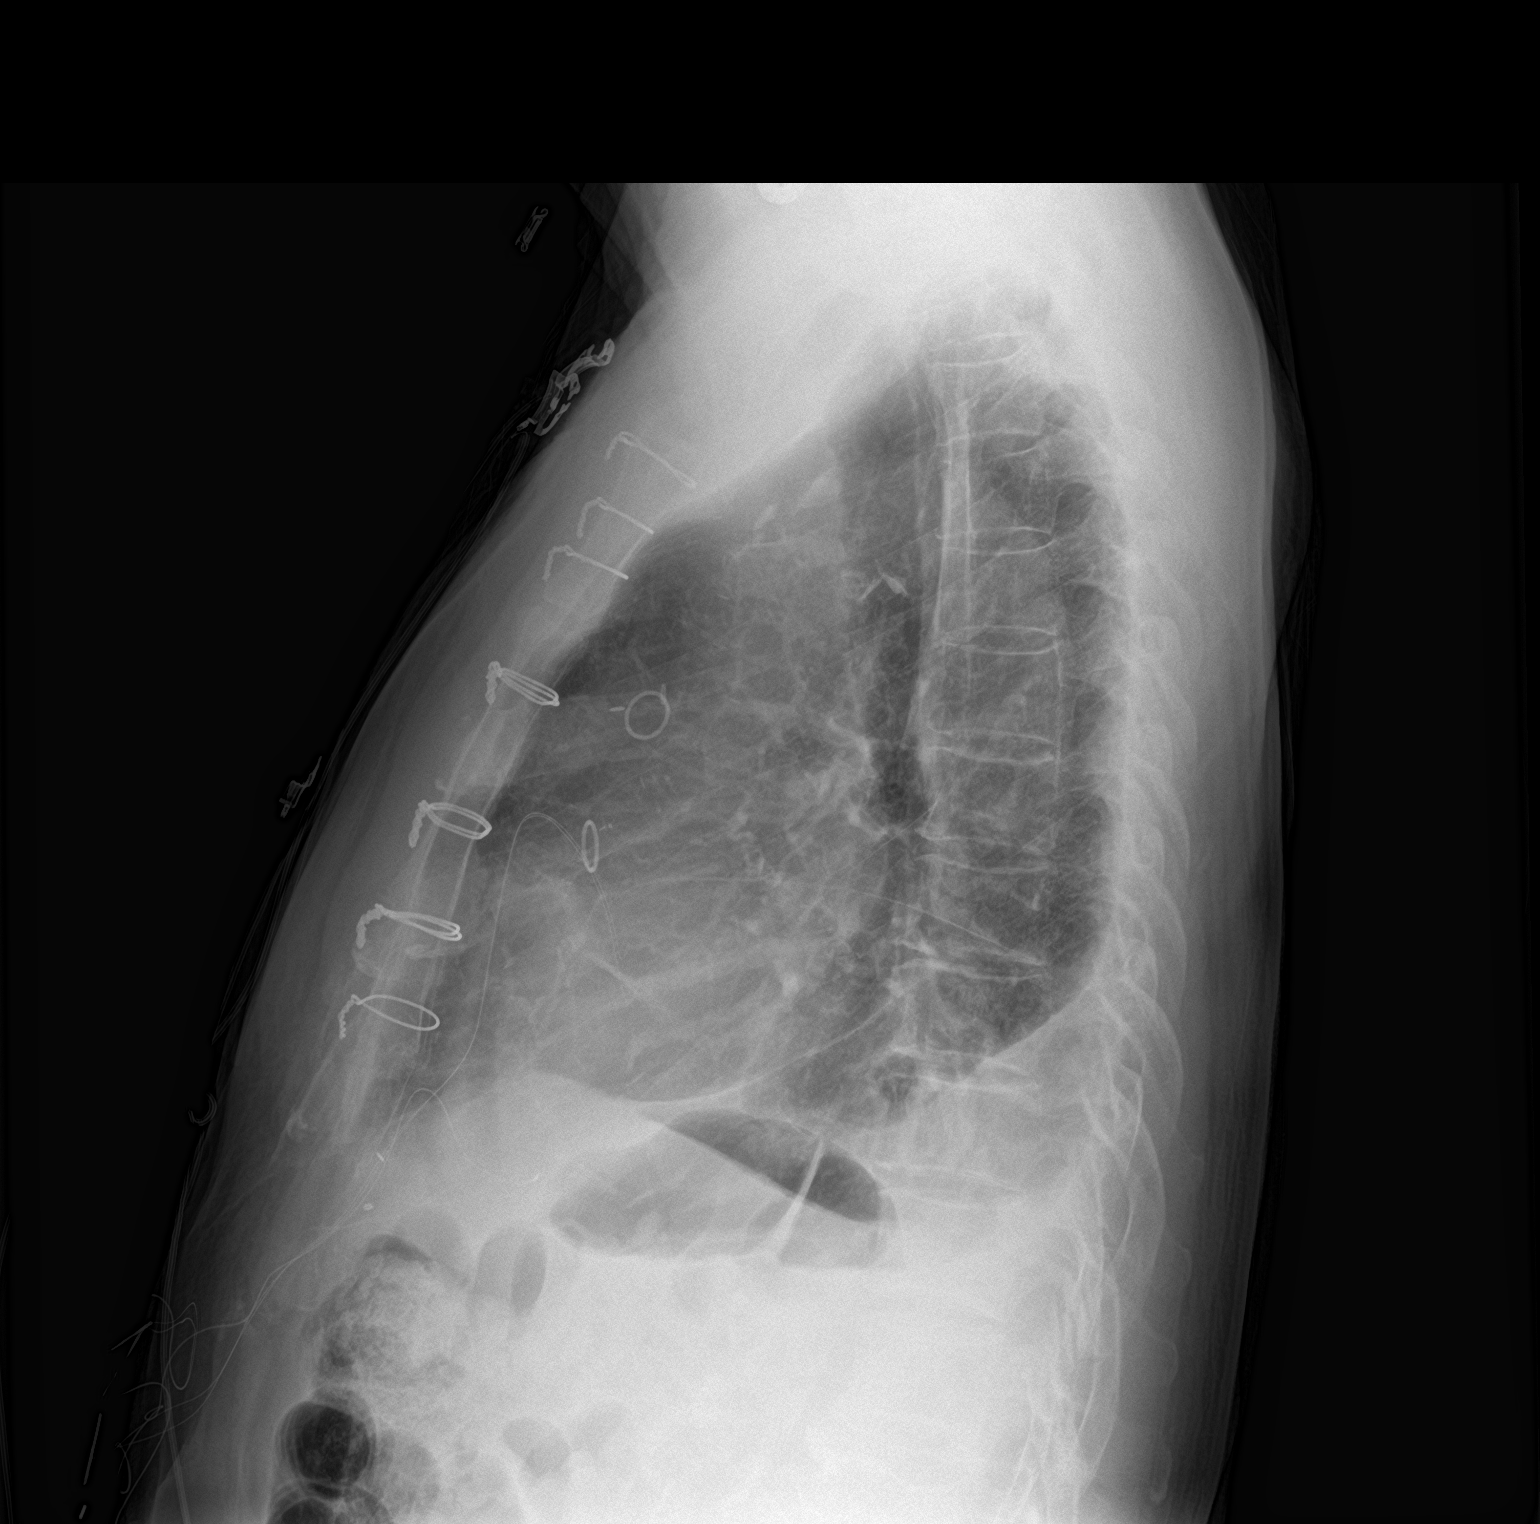

[2 of 2 positions shown; findings below may reference images not displayed]

FINDINGS: Right pneumothorax again noted, stable. Changes of CABG. Small
bilateral effusions with bibasilar atelectasis. Heart is normal
size.
IMPRESSION: Bibasilar atelectasis with small effusions.

Stable right pneumothorax.

## 2020-12-27 ENCOUNTER — Other Ambulatory Visit: Payer: Self-pay | Admitting: Physician Assistant

## 2021-02-16 ENCOUNTER — Ambulatory Visit (INDEPENDENT_AMBULATORY_CARE_PROVIDER_SITE_OTHER): Payer: PPO | Admitting: Family Medicine

## 2021-02-16 ENCOUNTER — Other Ambulatory Visit: Payer: Self-pay

## 2021-02-16 ENCOUNTER — Encounter: Payer: Self-pay | Admitting: Family Medicine

## 2021-02-16 VITALS — BP 126/78 | HR 58 | Temp 97.3°F | Ht 70.0 in | Wt 152.8 lb

## 2021-02-16 DIAGNOSIS — N1831 Chronic kidney disease, stage 3a: Secondary | ICD-10-CM | POA: Diagnosis not present

## 2021-02-16 DIAGNOSIS — I129 Hypertensive chronic kidney disease with stage 1 through stage 4 chronic kidney disease, or unspecified chronic kidney disease: Secondary | ICD-10-CM | POA: Diagnosis not present

## 2021-02-16 DIAGNOSIS — I25119 Atherosclerotic heart disease of native coronary artery with unspecified angina pectoris: Secondary | ICD-10-CM

## 2021-02-16 DIAGNOSIS — E785 Hyperlipidemia, unspecified: Secondary | ICD-10-CM

## 2021-02-16 DIAGNOSIS — M51369 Other intervertebral disc degeneration, lumbar region without mention of lumbar back pain or lower extremity pain: Secondary | ICD-10-CM

## 2021-02-16 DIAGNOSIS — Z0001 Encounter for general adult medical examination with abnormal findings: Secondary | ICD-10-CM

## 2021-02-16 DIAGNOSIS — M5136 Other intervertebral disc degeneration, lumbar region: Secondary | ICD-10-CM | POA: Diagnosis not present

## 2021-02-16 DIAGNOSIS — Z951 Presence of aortocoronary bypass graft: Secondary | ICD-10-CM | POA: Diagnosis not present

## 2021-02-16 DIAGNOSIS — R739 Hyperglycemia, unspecified: Secondary | ICD-10-CM

## 2021-02-16 DIAGNOSIS — I10 Essential (primary) hypertension: Secondary | ICD-10-CM | POA: Diagnosis not present

## 2021-02-16 LAB — COMPREHENSIVE METABOLIC PANEL
ALT: 24 U/L (ref 0–53)
AST: 24 U/L (ref 0–37)
Albumin: 3.6 g/dL (ref 3.5–5.2)
Alkaline Phosphatase: 111 U/L (ref 39–117)
BUN: 19 mg/dL (ref 6–23)
CO2: 28 mEq/L (ref 19–32)
Calcium: 9 mg/dL (ref 8.4–10.5)
Chloride: 105 mEq/L (ref 96–112)
Creatinine, Ser: 1.22 mg/dL (ref 0.40–1.50)
GFR: 58.62 mL/min — ABNORMAL LOW (ref >60.00)
Glucose, Bld: 88 mg/dL (ref 70–99)
Potassium: 4.2 mEq/L (ref 3.5–5.1)
Sodium: 137 mEq/L (ref 135–145)
Total Bilirubin: 0.8 mg/dL (ref 0.2–1.2)
Total Protein: 6.2 g/dL (ref 6.0–8.3)

## 2021-02-16 LAB — LIPID PANEL
Cholesterol: 125 mg/dL (ref 0–200)
HDL: 67.7 mg/dL (ref >39.00)
LDL Cholesterol: 49 mg/dL (ref 0–99)
NonHDL: 57.39
Total CHOL/HDL Ratio: 2
Triglycerides: 40 mg/dL (ref 0.0–149.0)
VLDL: 8 mg/dL (ref 0.0–40.0)

## 2021-02-16 LAB — CBC
HCT: 37.4 % — ABNORMAL LOW (ref 39.0–52.0)
Hemoglobin: 12.3 g/dL — ABNORMAL LOW (ref 13.0–17.0)
MCHC: 32.9 g/dL (ref 30.0–36.0)
MCV: 94.5 fl (ref 78.0–100.0)
Platelets: 263 10*3/uL (ref 150.0–400.0)
RBC: 3.96 Mil/uL — ABNORMAL LOW (ref 4.22–5.81)
RDW: 13.3 % (ref 11.5–15.5)
WBC: 6.5 10*3/uL (ref 4.0–10.5)

## 2021-02-16 LAB — HEMOGLOBIN A1C: Hgb A1c MFr Bld: 6 % (ref 4.6–6.5)

## 2021-02-16 LAB — TSH: TSH: 2.96 u[IU]/mL (ref 0.35–5.50)

## 2021-02-16 NOTE — Assessment & Plan Note (Signed)
Check labs.  Continue Lipitor 80 mg daily.

## 2021-02-16 NOTE — Assessment & Plan Note (Signed)
Continue management per orthopedics. 

## 2021-02-16 NOTE — Progress Notes (Signed)
Chief Complaint:  Jeff Graves is a 76 y.o. male who presents today for his annual comprehensive physical exam.    Assessment/Plan:  Chronic Problems Addressed Today: Essential hypertension On amlodipine 7.5 mg daily and metoprolol 12.5 mg twice daily.  Tolerating well.  At goal today.  Dyslipidemia Check labs.  Continue Lipitor 80 mg daily.  Hyperglycemia Check A1c.  Degenerative disc disease, lumbar Continue management per orthopedics.  S/P CABG x 4 Continue management per cardiology.  On aspirin and statin.  CKD (chronic kidney disease), stage III Check c-Met.  Coronary artery disease involving native coronary artery of native heart with angina pectoris ([place]) On aspirin and statin per cardiology.  Preventative Healthcare: Up-to-date colon cancer screening and vaccines.  We will check labs today.  Patient Counseling(The following topics were reviewed and/or handout was given):  -Nutrition: Stressed importance of moderation in sodium/caffeine intake, saturated fat and cholesterol, caloric balance, sufficient intake of fresh fruits, vegetables, and fiber.  -Stressed the importance of regular exercise.   -Substance Abuse: Discussed cessation/primary prevention of tobacco, alcohol, or other drug use; driving or other dangerous activities under the influence; availability of treatment for abuse.   -Injury prevention: Discussed safety belts, safety helmets, smoke detector, smoking near bedding or upholstery.   -Sexuality: Discussed sexually transmitted diseases, partner selection, use of condoms, avoidance of unintended pregnancy and contraceptive alternatives.   -Dental health: Discussed importance of regular tooth brushing, flossing, and dental visits.  -Health maintenance and immunizations reviewed. Please refer to Health maintenance section.  Return to care in 1 year for next preventative visit.     Subjective:  HPI:  He has no acute complaints today. See A/p for  status of chronic conditions.   Lifestyle Diet: Balanced.  Exercise: Goes to gym 3 times per week.   Depression screen PHQ 2/9 02/16/2021  Decreased Interest 0  Down, Depressed, Hopeless 0  PHQ - 2 Score 0    Health Maintenance Due  Topic Date Due   COVID-19 Vaccine (4 - Booster for Moderna series) 02/17/2020     ROS: Per HPI, otherwise a complete review of systems was negative.   PMH:  The following were reviewed and entered/updated in epic: Past Medical History:  Diagnosis Date   Allergy    seasonal   Atrial tachycardia ([place]) 06/04/2012   12/13-QRS duration-102 ms 6/14-QRS duration-98 ms    CAD (coronary artery disease)    a. s/p CABG 11/2018   CKD (chronic kidney disease), stage II    DDD (degenerative disc disease)    Diverticulosis of colon    Hemorrhoids    Hypercholesterolemia    Hypertension    NSVT (nonsustained ventricular tachycardia)    PAC (premature atrial contraction)    Pneumothorax    Postoperative atrial fibrillation ([place])    Renal calculus    RENAL CALCULUS, HX OF 12/26/2006   Qualifier: Diagnosis of  By: June Leap     S/P rotator cuff surgery 04/15/2014   Shoulder pain    Sinus bradycardia    Patient Active Problem List   Diagnosis Date Noted   Degenerative disc disease, lumbar 02/16/2021   Hyperglycemia 02/16/2021   Dyslipidemia 02/16/2021   Former smoker 02/15/2020   S/P CABG x 4 11/24/2018   CKD (chronic kidney disease), stage III 11/20/2018   Coronary artery disease involving native coronary artery of native heart with angina pectoris ([place])    Cervical disc disorder with radiculopathy of cervical region 01/22/2013   Atrial tachycardia 06/04/2012  Essential hypertension 12/26/2006   Past Surgical History:  Procedure Laterality Date   COLONOSCOPY     CORONARY ARTERY BYPASS GRAFT N/A 11/23/2018   Procedure: CORONARY ARTERY BYPASS GRAFTING (CABG), ON PUMP, TIMES FOUR, LIMA to LAD, SEQ SVG to PDA, PLVB, SVG to CIRCUMFLEX, USING LEFT  INTERNAL MAMMARY ARTERY AND RIGHT GREATER SAPHENOUS VEIN HARVESTED ENDOSCOPICALLY AND LEFT GREATER SAPHENOUS VEIN;  Surgeon: [name], MD;  Location: [facility];  Service: Open Heart Surgery;  Laterality: N/A;   LEFT HEART CATH AND CORONARY ANGIOGRAPHY N/A 11/18/2018   Procedure: LEFT HEART CATH AND CORONARY ANGIOGRAPHY;  Surgeon: Burnell Blanks, MD;  Location: [facility];  Service: Cardiovascular;  Laterality: N/A;   None     SHOULDER SURGERY Left 03/29/2014   TEE WITHOUT CARDIOVERSION N/A 11/23/2018   Procedure: TRANSESOPHAGEAL ECHOCARDIOGRAM (TEE);  Surgeon: [name], MD;  Location: [facility];  Service: Open Heart Surgery;  Laterality: N/A;    Family History  Problem Relation Age of Onset   Heart attack Father 32   Valvular heart disease Mother 81   Lymphoma Sister        Deceased   Breast cancer Sister        Alive   Colon cancer Neg Hx    Esophageal cancer Neg Hx    Rectal cancer Neg Hx    Stomach cancer Neg Hx     Medications- reviewed and updated Current Outpatient Medications  Medication Sig Dispense Refill   amLODipine (NORVASC) 5 MG tablet TAKE 1 AND 1/2 TABLET BY MOUTH DAILY 135 tablet 1   aspirin EC 81 MG tablet Take 81 mg by mouth daily. Swallow whole.     atorvastatin (LIPITOR) 80 MG tablet TAKE ONE TABLET BY MOUTH DAILY AT 6PM 90 tablet 3   Cholecalciferol (VITAMIN D) 2000 units CAPS Take 2,000 Units by mouth daily.     Cyanocobalamin (VITAMIN B-12) 5000 MCG LOZG Take 5,000 mcg by mouth daily.      loratadine (CLARITIN) 10 MG tablet Take 10 mg by mouth daily as needed for allergies.      metoprolol tartrate (LOPRESSOR) 25 MG tablet TAKE 1/2 TABLET BY MOUTH TWO TIMES A DAY 90 tablet 3   Multiple Vitamins-Minerals (MULTIVITAMIN WITH MINERALS) tablet Take 1 tablet by mouth daily.     No current facility-administered medications for this visit.    Allergies-reviewed and updated No Known Allergies  Social History   Socioeconomic History    Marital status: Married    Spouse name: brenda   Number of children: 1   Years of education: 12   Highest education level: High school graduate  Occupational History   Occupation: HVAC with air quality    Employer: [employer]  Tobacco Use   Smoking status: Former    Packs/day: 0.50    Years: 20.00    Pack years: 10.00    Types: Cigarettes    Quit date: 02/12/1984    Years since quitting: 37.0   Smokeless tobacco: Never  Vaping Use   Vaping Use: Never used  Substance and Sexual Activity   Alcohol use: No    Alcohol/week: 0.0 standard drinks    Comment: social use   Drug use: No   Sexual activity: Not on file  Other Topics Concern   Not on file  Social History Narrative   Not on file   Social Determinants of Health   Financial Resource Strain: Not on file  Food Insecurity: Not on file  Transportation Needs: Not on file  Physical Activity: Not on file  Stress: Not on file  Social Connections: Not on file        Objective:  Physical Exam: BP 126/78 (BP Location: Left Arm, Patient Position: Sitting, Cuff Size: Normal)    Pulse (!) 58    Temp (!) 97.3 F (36.3 C) (Temporal)    Ht _0  (1.778 m)    Wt 152 lb 12.8 oz (69.3 kg)    SpO2 94%    BMI 21.92 kg/m   Body mass index is 21.92 kg/m. Wt Readings from Last 3 Encounters:  02/16/21 152 lb 12.8 oz (69.3 kg)  04/26/20 154 lb (69.9 kg)  02/18/20 152 lb 9.6 oz (69.2 kg)   Gen: NAD, resting comfortably HEENT: TMs normal bilaterally. OP clear. No thyromegaly noted.  CV: RRR with no murmurs appreciated Pulm: NWOB, CTAB with no crackles, wheezes, or rhonchi GI: Normal bowel sounds present. Soft, Nontender, Nondistended. MSK: no edema, cyanosis, or clubbing noted Skin: warm, dry Neuro: CN2-12 grossly intact. Strength 5/5 in upper and lower extremities. Reflexes symmetric and intact bilaterally.  Psych: Normal affect and thought content     Sairah Knobloch M. Jerline Pain, MD 02/16/2021 8:47 AM

## 2021-02-16 NOTE — Assessment & Plan Note (Signed)
Continue management per cardiology. On aspirin and statin.  

## 2021-02-16 NOTE — Assessment & Plan Note (Signed)
Check c-Met. 

## 2021-02-16 NOTE — Assessment & Plan Note (Signed)
Check A1c. 

## 2021-02-16 NOTE — Assessment & Plan Note (Signed)
On amlodipine 7.5 mg daily and metoprolol 12.5 mg twice daily.  Tolerating well.  At goal today.

## 2021-02-16 NOTE — Assessment & Plan Note (Signed)
On aspirin and statin per cardiology.

## 2021-02-16 NOTE — Patient Instructions (Signed)
It was very nice to see you today!  We will check blood work today.  Please continue to work on diet and exercise.  We will see you back in 1 year.  Please come back to see [place] sooner if needed.  Take care, Dr Jerline Pain  PLEASE NOTE:  If you had any lab tests please let us know if you have not heard back within a few days. You may see your results on mychart before we have a chance to review them but we will give you a call once they are reviewed by [place]. If we ordered any referrals today, please let us know if you have not heard from their office within the next week.   Please try these tips to maintain a healthy lifestyle:  Eat at least 3 REAL meals and 1-2 snacks per day.  Aim for no more than 5 hours between eating.  If you eat breakfast, please do so within one hour of getting up.   Each meal should contain half fruits/vegetables, one quarter protein, and one quarter carbs (no bigger than a computer mouse)  Cut down on sweet beverages. This includes juice, soda, and sweet tea.   Drink at least 1 glass of water with each meal and aim for at least 8 glasses per day  Exercise at least 150 minutes every week.    Preventive Care 43 Years and Older, Male Preventive care refers to lifestyle choices and visits with your health care provider that can promote health and wellness. Preventive care visits are also called wellness exams. What can I expect for my preventive care visit? Counseling During your preventive care visit, your health care provider may ask about your: Medical history, including: Past medical problems. Family medical history. History of falls. Current health, including: Emotional well-being. Home life and relationship well-being. Sexual activity. Memory and ability to understand (cognition). Lifestyle, including: Alcohol, nicotine or tobacco, and drug use. Access to firearms. Diet, exercise, and sleep habits. Work and work Statistician. Sunscreen use. Safety  issues such as seatbelt and bike helmet use. Physical exam Your health care provider will check your: Height and weight. These may be used to calculate your BMI (body mass index). BMI is a measurement that tells if you are at a healthy weight. Waist circumference. This measures the distance around your waistline. This measurement also tells if you are at a healthy weight and may help predict your risk of certain diseases, such as type 2 diabetes and high blood pressure. Heart rate and blood pressure. Body temperature. Skin for abnormal spots. What immunizations do I need? Vaccines are usually given at various ages, according to a schedule. Your health care provider will recommend vaccines for you based on your age, medical history, and lifestyle or other factors, such as travel or where you work. What tests do I need? Screening Your health care provider may recommend screening tests for certain conditions. This may include: Lipid and cholesterol levels. Diabetes screening. This is done by checking your blood sugar (glucose) after you have not eaten for a while (fasting). Hepatitis C test. Hepatitis B test. HIV (human immunodeficiency virus) test. STI (sexually transmitted infection) testing, if you are at risk. Lung cancer screening. Colorectal cancer screening. Prostate cancer screening. Abdominal aortic aneurysm (AAA) screening. You may need this if you are a current or former smoker. Talk with your health care provider about your test results, treatment options, and if necessary, the need for more tests. Follow these instructions at home: Eating  and drinking  Eat a diet that includes fresh fruits and vegetables, whole grains, lean protein, and low-fat dairy products. Limit your intake of foods with high amounts of sugar, saturated fats, and salt. Take vitamin and mineral supplements as recommended by your health care provider. Do not drink alcohol if your health care provider tells  you not to drink. If you drink alcohol: Limit how much you have to 0-2 drinks a day. Know how much alcohol is in your drink. In the [place] one drink equals one 12 oz bottle of beer (355 mL), one 5 oz glass of wine (148 mL), or one 1 oz glass of hard liquor (44 mL). Lifestyle Brush your teeth every morning and night with fluoride toothpaste. Floss one time each day. Exercise for at least 30 minutes 5 or more days each week. Do not use any products that contain nicotine or tobacco. These products include cigarettes, chewing tobacco, and vaping devices, such as e-cigarettes. If you need help quitting, ask your health care provider. Do not use drugs. If you are sexually active, practice safe sex. Use a condom or other form of protection to prevent STIs. Take aspirin only as told by your health care provider. Make sure that you understand how much to take and what form to take. Work with your health care provider to find out whether it is safe and beneficial for you to take aspirin daily. Ask your health care provider if you need to take a cholesterol-lowering medicine (statin). Find healthy ways to manage stress, such as: Meditation, yoga, or listening to music. Journaling. Talking to a trusted person. Spending time with friends and family. Safety Always wear your seat belt while driving or riding in a vehicle. Do not drive: If you have been drinking alcohol. Do not ride with someone who has been drinking. When you are tired or distracted. While texting. If you have been using any mind-altering substances or drugs. Wear a helmet and other protective equipment during sports activities. If you have firearms in your house, make sure you follow all gun safety procedures. Minimize exposure to UV radiation to reduce your risk of skin cancer. What's next? Visit your health care provider once a year for an annual wellness visit. Ask your health care provider how often you should have your eyes and  teeth checked. Stay up to date on all vaccines. This information is not intended to replace advice given to you by your health care provider. Make sure you discuss any questions you have with your health care provider. Document Revised: 07/26/2020 Document Reviewed: 07/26/2020 Elsevier Patient Education  [place].

## 2021-02-19 NOTE — Progress Notes (Signed)
Please inform patient of the following:  His blood sugar is borderline elevated but stable. Everything else is NORMAL. Do not need to make any changes to his treatment plan at this time. He should continue working on diet and exercise and we can recheck in a year or so.  Jeff Graves. Jerline Pain, MD 02/19/2021 12:15 PM

## 2021-02-26 ENCOUNTER — Encounter: Payer: Self-pay | Admitting: Family Medicine

## 2021-02-26 ENCOUNTER — Ambulatory Visit (INDEPENDENT_AMBULATORY_CARE_PROVIDER_SITE_OTHER): Payer: PPO | Admitting: Family Medicine

## 2021-02-26 ENCOUNTER — Other Ambulatory Visit: Payer: Self-pay

## 2021-02-26 VITALS — BP 146/77 | HR 46 | Temp 97.6°F | Ht 70.0 in | Wt 157.4 lb

## 2021-02-26 DIAGNOSIS — I10 Essential (primary) hypertension: Secondary | ICD-10-CM

## 2021-02-26 DIAGNOSIS — R739 Hyperglycemia, unspecified: Secondary | ICD-10-CM | POA: Diagnosis not present

## 2021-02-26 MED ORDER — AZITHROMYCIN 250 MG PO TABS: ORAL_TABLET | ORAL | 0 refills | Status: DC

## 2021-02-26 MED ORDER — BENZONATATE 200 MG PO CAPS: 200.0000 mg | ORAL_CAPSULE | Freq: Two times a day (BID) | ORAL | 0 refills | Status: DC | PRN

## 2021-02-26 NOTE — Patient Instructions (Addendum)
It was very nice to see you today!  Please start the zpack and tessalon.  Please cortisone cream for the spots on your skin.   No other changes today.  Please let me know if you are not improving.   Take care, Dr Jerline Pain  PLEASE NOTE:  If you had any lab tests please let us know if you have not heard back within a few days. You may see your results on mychart before we have a chance to review them but we will give you a call once they are reviewed by [place]. If we ordered any referrals today, please let us know if you have not heard from their office within the next week.   Please try these tips to maintain a healthy lifestyle:  Eat at least 3 REAL meals and 1-2 snacks per day.  Aim for no more than 5 hours between eating.  If you eat breakfast, please do so within one hour of getting up.   Each meal should contain half fruits/vegetables, one quarter protein, and one quarter carbs (no bigger than a computer mouse)  Cut down on sweet beverages. This includes juice, soda, and sweet tea.   Drink at least 1 glass of water with each meal and aim for at least 8 glasses per day  Exercise at least 150 minutes every week.

## 2021-02-26 NOTE — Assessment & Plan Note (Signed)
Last A1c 6.0.  Discussed lifestyle modifications. 

## 2021-02-26 NOTE — Progress Notes (Signed)
° °  Jeff Graves is a 76 y.o. male who presents today for an office visit.  Assessment/Plan:  New/Acute Problems: Cough No red flags.  Reassuring lung exam exam.  Likely has sinusitis. Given length of symptoms will start azithromycin.  Also start Tessalon.  Encouraged hydration.  Discussed reasons to return to care.  Continues over-the-counter meds if needed.  Xeroderma No red flags.  Recommend over-the-counter cortisone cream.  Chronic Problems Addressed Today: Essential hypertension On amlodipine 7.5 mg daily and metoprolol 12.5 mg twice daily.  At goal today.  Hyperglycemia Last A1c 6.0.  Discussed lifestyle modifications.     Subjective:  HPI:  Patient with congestion for the last 10 days. Associated with cough and drainage. No fevers or chills. No shortness of breath or chest pain.  Has not tried any OTC medications. No known sick contacts.   He also has a few dry irritated spots on his arms and legs.  Start about a month ago.  No treatments tried.  See A/p for status of chronic conditions.          Objective:  Physical Exam: BP (!) 146/77    Pulse (!) 46    Temp 97.6 F (36.4 C) (Temporal)    Ht 5\' 10"  (1.778 m)    Wt 157 lb 6 oz (71.4 kg)    SpO2 98%    BMI 22.58 kg/m   Gen: No acute distress, resting comfortably HEENT: TMs clear.  OP erythematous CV: Regular rate and rhythm with no murmurs appreciated Pulm: Normal work of breathing, clear to auscultation bilaterally with no crackles, wheezes, or rhonchi Neuro: Grossly normal, moves all extremities Psych: Normal affect and thought content      Jeff Graves M. Jerline Pain, MD 02/26/2021 9:11 AM

## 2021-02-26 NOTE — Assessment & Plan Note (Signed)
On amlodipine 7.5 mg daily and metoprolol 12.5 mg twice daily.  At goal today.

## 2021-03-12 ENCOUNTER — Other Ambulatory Visit: Payer: Self-pay | Admitting: Cardiovascular Disease

## 2021-04-12 ENCOUNTER — Telehealth: Payer: Self-pay | Admitting: Family Medicine

## 2021-04-12 NOTE — Telephone Encounter (Signed)
Patient finished his meds and is still not feeling better with congestion. Stated if Dr Jerline Pain can call him in a new med or should he come in for an office visit.?  ?

## 2021-04-12 NOTE — Telephone Encounter (Signed)
Its been almost 2 months. Can we have him come in for another visit? ? ?Jeff Graves. Jerline Pain, MD ?04/12/2021 3:31 PM  ? ?

## 2021-04-12 NOTE — Telephone Encounter (Signed)
Pt is schedule for appt  ?

## 2021-04-12 NOTE — Telephone Encounter (Signed)
Please call patient and schedule office visit per Dr. Jerline Pain. ?

## 2021-04-12 NOTE — Telephone Encounter (Signed)
Please advise 

## 2021-04-13 ENCOUNTER — Ambulatory Visit (INDEPENDENT_AMBULATORY_CARE_PROVIDER_SITE_OTHER)
Admission: RE | Admit: 2021-04-13 | Discharge: 2021-04-13 | Disposition: A | Discharge status: Home / Self Care | Payer: PPO | Source: Ambulatory Visit | Attending: Family Medicine | Admitting: Family Medicine

## 2021-04-13 ENCOUNTER — Ambulatory Visit (INDEPENDENT_AMBULATORY_CARE_PROVIDER_SITE_OTHER): Payer: PPO | Admitting: Family Medicine

## 2021-04-13 ENCOUNTER — Other Ambulatory Visit: Payer: Self-pay

## 2021-04-13 ENCOUNTER — Encounter: Payer: Self-pay | Admitting: Family Medicine

## 2021-04-13 VITALS — BP 126/75 | HR 52 | Temp 98.0°F | Ht 70.0 in | Wt 153.2 lb

## 2021-04-13 DIAGNOSIS — Z87891 Personal history of nicotine dependence: Secondary | ICD-10-CM

## 2021-04-13 DIAGNOSIS — I10 Essential (primary) hypertension: Secondary | ICD-10-CM

## 2021-04-13 DIAGNOSIS — R059 Cough, unspecified: Secondary | ICD-10-CM

## 2021-04-13 MED ORDER — AMOXICILLIN-POT CLAVULANATE 875-125 MG PO TABS: 1.0000 | ORAL_TABLET | Freq: Two times a day (BID) | ORAL | 0 refills | Status: DC

## 2021-04-13 NOTE — Assessment & Plan Note (Signed)
At goal today on amlodipine 7.5 mg daily and metoprolol tartrate 12.5 mg twice daily. ?

## 2021-04-13 NOTE — Progress Notes (Signed)
? ?  Jeff Graves is a 76 y.o. male who presents today for an office visit. ? ?Assessment/Plan:  ?New/Acute Problems: ?Cough ?Likely still has persistent sinusitis with postnasal drip causing his cough.  His lung exam today is clear that he is a former smoker.  We will check x-ray given his length of symptoms to rule out any other pulmonary causes.  We will treat his persistent sinusitis with a course of Augmentin.  If he continues to have nasal congestion and cough beyond this will likely need referral to ENT for further evaluation.  We discussed reasons to return to care. ? ?Chronic Problems Addressed Today: ?Essential hypertension ?At goal today on amlodipine 7.5 mg daily and metoprolol tartrate 12.5 mg twice daily. ? ?Former smoker ?He is not qualified for low-dose CT lung cancer screening in the past.  May have small amount of underlying COPD or emphysema though has a reassuring lung exam today.  Will check chest x-ray.  If cough persist may need referral to pulmonology for PFTs. ? ? ?  ?Subjective:  ?HPI: ? ?Patient here with persistent sinusitis. We saw him about 6 weeks ago for this. We started him on azithromycin and symptoms improved modestly but it still persisted.  Has a lot of nasal congestion.  Cough is worse at night.  Little bit of wheezing.  No shortness of breath.  No chest pain.  No fevers or chills.  No other specific treatments tried. ? ?   ?  ?Objective:  ?Physical Exam: ?BP 126/75 (BP Location: Left Arm)   Pulse (!) 52   Temp 98 ?F (36.7 ?C) (Temporal)   Ht 5\' 10"  (1.778 m)   Wt 153 lb 3.2 oz (69.5 kg)   SpO2 100%   BMI 21.98 kg/m?   ?Gen: No acute distress, resting comfortably ?HEENT: TMs with clear effusion.  OP erythematous.  Nasal mucosa erythematous and boggy bilaterally. ?CV: Regular rate and rhythm with no murmurs appreciated ?Pulm: Normal work of breathing, clear to auscultation bilaterally with no crackles, wheezes, or rhonchi ?Neuro: Grossly normal, moves all  extremities ?Psych: Normal affect and thought content ? ?   ? ?Algis Greenhouse. Jerline Pain, MD ?04/13/2021 1:41 PM  ?

## 2021-04-13 NOTE — Assessment & Plan Note (Signed)
He is not qualified for low-dose CT lung cancer screening in the past.  May have small amount of underlying COPD or emphysema though has a reassuring lung exam today.  Will check chest x-ray.  If cough persist may need referral to pulmonology for PFTs. ?

## 2021-04-13 NOTE — Patient Instructions (Signed)
It was very nice to see you today! ? ?Please start the Augmentin.  Go to the [facility] office to have an x-ray done to make sure there is nothing going on in your lungs. ? ?Let me know if not improving in 1 to 2 weeks. ? ?Take care, ?Dr Jerline Pain ? ?PLEASE NOTE: ? ?If you had any lab tests please let us know if you have not heard back within a few days. You may see your results on mychart before we have a chance to review them but we will give you a call once they are reviewed by [place]. If we ordered any referrals today, please let us know if you have not heard from their office within the next week.  ? ?Please try these tips to maintain a healthy lifestyle: ? ?Eat at least 3 REAL meals and 1-2 snacks per day.  Aim for no more than 5 hours between eating.  If you eat breakfast, please do so within one hour of getting up.  ? ?Each meal should contain half fruits/vegetables, one quarter protein, and one quarter carbs (no bigger than a computer mouse) ? ?Cut down on sweet beverages. This includes juice, soda, and sweet tea.  ? ?Drink at least 1 glass of water with each meal and aim for at least 8 glasses per day ? ?Exercise at least 150 minutes every week.   ?

## 2021-04-16 ENCOUNTER — Other Ambulatory Visit: Payer: Self-pay | Admitting: Cardiovascular Disease

## 2021-04-16 NOTE — Progress Notes (Signed)
Please inform patient of the following: ? ?His xray is normal with no causes for his cough.  Would like for him to let us know if symptoms or not improving.

## 2021-04-23 ENCOUNTER — Other Ambulatory Visit: Payer: Self-pay | Admitting: Cardiovascular Disease

## 2021-04-25 ENCOUNTER — Other Ambulatory Visit: Payer: Self-pay

## 2021-04-25 NOTE — Progress Notes (Signed)
Error

## 2021-04-28 ENCOUNTER — Other Ambulatory Visit: Payer: Self-pay | Admitting: Cardiovascular Disease

## 2021-05-04 NOTE — Progress Notes (Signed)
? ?Cardiology Office Note   ? ?Date:  05/09/2021  ? ?ID:  Jeff Graves, DOB [birth year], MRN [id number] ? ? ?PCP:  Vivi Barrack, MD ?  ?[facility] [place]  ?Cardiologist:  Lauree Chandler, MD   ?Advanced Practice Provider:  No care team member to display ?Electrophysiologist:  Virl Axe, MD  ? ?[id number]}  ? ?Chief Complaint  ?Patient presents with  ? Follow-up  ? ? ?History of Present Illness:  ?Jeff Graves is a 76 y.o. male  with history of atrial tachycardia, CAD s/p CABG 11/2018 with post-op PTX and PAF (briefly on amiodarone), HTN, HLD, CKD stage II-IIIa, sinus bradycardia, mild dilation of aorta  ? ?Zio monitor showed predominantly NSR with 2 episodes NSVT (max 7 beats), 42 runs of SVT (max 10 beats), some episodes were atrial tach with variable block, PACs/PVCs present - PVC burden 2.5%, average HR 61bpm, min HR 41, max 187bpm.  Dr. Angelena Form who suggested continuation of BB. 2D Echo undertaken to ensure stability 04/2020 showed EF 60-65%, mild LVH, mild dilation of aortic root and ascending aorta.   ? ?Patient last saw Dayna Dunn PA-C 04/2020 and BP running high, amlodipine started and titrated up. ? ?Patient comes in for f/u. 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 Has had a couple of episodes of lightheadedness and exhaustion when going up and down attic steps and sits down and it passes. His watch shows HR 70-80's. Has happened 2-3 times recently.  Doesn't feel heart racing or skipping. No chest tightness. Tries to stay hydrated. Still exercising 30 min cardio 4-5 days/week. No symptoms. Previously on  flecainide and  followed by Dr. Caryl Comes before surgery. Drinking 5-6 glasses 1/2&1/2 sweet tea daily. ? ?Past Medical History:  ?Diagnosis Date  ? Allergy   ? seasonal  ? Atrial tachycardia ([place]) 06/04/2012  ? 12/13-QRS duration-102 ms 6/14-QRS duration-98 ms   ? CAD (coronary artery disease)   ? a. s/p CABG 11/2018  ? CKD (chronic kidney disease),  stage II   ? DDD (degenerative disc disease)   ? Diverticulosis of colon   ? Hemorrhoids   ? Hypercholesterolemia   ? Hypertension   ? NSVT (nonsustained ventricular tachycardia)   ? PAC (premature atrial contraction)   ? Pneumothorax   ? Postoperative atrial fibrillation (HCC)   ? Renal calculus   ? RENAL CALCULUS, HX OF 12/26/2006  ? Qualifier: Diagnosis of  By: June Leap    ? S/P rotator cuff surgery 04/15/2014  ? Shoulder pain   ? Sinus bradycardia   ? ? ?Past Surgical History:  ?Procedure Laterality Date  ? COLONOSCOPY    ? CORONARY ARTERY BYPASS GRAFT N/A 11/23/2018  ? Procedure: CORONARY ARTERY BYPASS GRAFTING (CABG), ON PUMP, TIMES FOUR, LIMA to LAD, SEQ SVG to PDA, PLVB, SVG to CIRCUMFLEX, USING LEFT INTERNAL MAMMARY ARTERY AND RIGHT GREATER SAPHENOUS VEIN HARVESTED ENDOSCOPICALLY AND LEFT GREATER SAPHENOUS VEIN;  Surgeon: [name], MD;  Location: [facility];  Service: Open Heart Surgery;  Laterality: N/A;  ? LEFT HEART CATH AND CORONARY ANGIOGRAPHY N/A 11/18/2018  ? Procedure: LEFT HEART CATH AND CORONARY ANGIOGRAPHY;  Surgeon: Burnell Blanks, MD;  Location: [facility];  Service: Cardiovascular;  Laterality: N/A;  ? None    ? SHOULDER SURGERY Left 03/29/2014  ? TEE WITHOUT CARDIOVERSION N/A 11/23/2018  ? Procedure: TRANSESOPHAGEAL ECHOCARDIOGRAM (TEE);  Surgeon: [name], MD;  Location: [facility];  Service: Open Heart Surgery;  Laterality: N/A;  ? ? ?Current Medications: ?Current Meds  ?Medication Sig  ? amLODipine (NORVASC) 5 MG tablet TAKE 1 AND 1/2 TABLET BY MOUTH DAILY  ? aspirin EC 81 MG tablet Take 81 mg by mouth daily. Swallow whole.  ? atorvastatin (LIPITOR) 80 MG tablet TAKE ONE TABLET BY MOUTH EVERY EVENING AT 6:00PM  ? Cholecalciferol (VITAMIN D) 2000 units CAPS Take 2,000 Units by mouth daily.  ? Cyanocobalamin (VITAMIN B-12) 5000 MCG LOZG Take 5,000 mcg by mouth daily.   ? loratadine (CLARITIN) 10 MG tablet Take 10 mg by mouth daily as needed for allergies.   ?  metoprolol tartrate (LOPRESSOR) 25 MG tablet TAKE 1/2 TABLET BY MOUTH TWICE A DAY  ? Multiple Vitamins-Minerals (MULTIVITAMIN WITH MINERALS) tablet Take 1 tablet by mouth daily.  ?  ? ?Allergies:   Patient has no known allergies.  ? ?Social History  ? ?Socioeconomic History  ? Marital status: Married  ?  Spouse name: brenda  ? Number of children: 1  ? Years of education: 14  ? Highest education level: High school graduate  ?Occupational History  ? Occupation: HVAC with air quality  ?  Employer: [employer]  ?Tobacco Use  ? Smoking status: Former  ?  Packs/day: 0.50  ?  Years: 20.00  ?  Pack years: 10.00  ?  Types: Cigarettes  ?  Quit date: 02/12/1984  ?  Years since quitting: 37.2  ? Smokeless tobacco: Never  ?Vaping Use  ? Vaping Use: Never used  ?Substance and Sexual Activity  ? Alcohol use: No  ?  Alcohol/week: 0.0 standard drinks  ?  Comment: social use  ? Drug use: No  ? Sexual activity: Not on file  ?Other Topics Concern  ? Not on file  ?Social History Narrative  ? Not on file  ? ?Social Determinants of Health  ? ?Financial Resource Strain: Not on file  ?Food Insecurity: Not on file  ?Transportation Needs: Not on file  ?Physical Activity: Not on file  ?Stress: Not on file  ?Social Connections: Not on file  ?  ? ?Family History:  The patient's  family history includes Breast cancer in his sister; Heart attack (age of onset: 28) in his father; Lymphoma in his sister; Valvular heart disease (age of onset: 98) in his mother.  ? ?ROS:   ?Please see the history of present illness.    ?ROS All other systems reviewed and are negative. ? ? ?PHYSICAL EXAM:   ?VS:  BP 122/68   Ht '5\' 10"'$  (1.778 m)   Wt 152 lb (68.9 kg)   SpO2 100%   BMI 21.81 kg/m?   ?Physical Exam  ?GEN: Thin, in no acute distress  ?Neck: no JVD, carotid bruits, or masses ?Cardiac:RRR; no murmurs, rubs, or gallops  ?Respiratory:  clear to auscultation bilaterally, normal work of breathing ?GI: soft, nontender, nondistended, + BS ?Ext: without  cyanosis, clubbing, or edema, Good distal pulses bilaterally ?Neuro:  Alert and Oriented x 3 ?Psych: euthymic mood, full affect ? ?Wt Readings from Last 3 Encounters:  ?05/09/21 152 lb (68.9 kg)  ?04/13/21 153 lb 3.2 oz (69.5 kg)  ?02/26/21 157 lb 6 oz (71.4 kg)  ?  ? ? ?Studies/Labs Reviewed:  ? ?EKG:  EKG is  ordered today.  The ekg ordered today demonstrates sinus bradycardia 48/m ? ?Recent Labs: ?02/16/2021: ALT 24; BUN 19; Creatinine, Ser 1.22; Hemoglobin 12.3; Platelets 263.0; Potassium 4.2; Sodium 137; TSH 2.96  ? ?Lipid Panel ?   ?  Component Value Date/Time  ? CHOL 125 02/16/2021 0846  ? CHOL 140 02/18/2020 0857  ? TRIG 40.0 02/16/2021 0846  ? HDL 67.70 02/16/2021 0846  ? HDL 87 02/18/2020 0857  ? CHOLHDL 2 02/16/2021 0846  ? VLDL 8.0 02/16/2021 0846  ? [place] 49 02/16/2021 0846  ? [place] 45 02/18/2020 0857  ? ? ?Additional studies/ records that were reviewed today include:  ?2D echo 04/2020 ? 1. Left ventricular ejection fraction, by estimation, is 60 to 65%. The  ?left ventricle has normal function. The left ventricle has no regional  ?wall motion abnormalities. There is mild left ventricular hypertrophy.  ?Left ventricular diastolic parameters  ?are indeterminate.  ? 2. Right ventricular systolic function is mildly reduced. The right  ?ventricular size is normal. There is normal pulmonary artery systolic  ?pressure.  ? 3. Left atrial size was moderately dilated.  ? 4. The mitral valve is normal in structure. Trivial mitral valve  ?regurgitation.  ? 5. The aortic valve is tricuspid. Aortic valve regurgitation is mild. No  ?aortic stenosis is present.  ? 6. Aortic dilatation noted. There is mild dilatation of the aortic root,  ?measuring 40 mm. There is dilatation of the ascending aorta, measuring 41  ?mm.  ?  ?Zio Monitor 03/2020 ?Patient had a min HR of 41 bpm, max HR of 187 bpm, and avg HR of 61 bpm. Predominant underlying rhythm was Sinus Rhythm. 2 Ventricular Tachycardia runs occurred, the run with the  fastest interval lasting 4 beats with a max rate of 184 bpm, the longest  ?lasting 7 beats with an avg rate of 116 bpm. 42 Supraventricular Tachycardia runs occurred, the run with the fastest interval lasting 5 bea

## 2021-05-09 ENCOUNTER — Encounter: Payer: Self-pay | Admitting: Physician Assistant

## 2021-05-09 ENCOUNTER — Other Ambulatory Visit: Payer: Self-pay

## 2021-05-09 ENCOUNTER — Ambulatory Visit (INDEPENDENT_AMBULATORY_CARE_PROVIDER_SITE_OTHER): Payer: PPO

## 2021-05-09 ENCOUNTER — Ambulatory Visit: Payer: PPO | Admitting: Physician Assistant

## 2021-05-09 VITALS — BP 122/68 | Ht 70.0 in | Wt 152.0 lb

## 2021-05-09 DIAGNOSIS — I10 Essential (primary) hypertension: Secondary | ICD-10-CM

## 2021-05-09 DIAGNOSIS — I2510 Atherosclerotic heart disease of native coronary artery without angina pectoris: Secondary | ICD-10-CM | POA: Diagnosis not present

## 2021-05-09 DIAGNOSIS — I471 Supraventricular tachycardia, unspecified: Secondary | ICD-10-CM

## 2021-05-09 DIAGNOSIS — E785 Hyperlipidemia, unspecified: Secondary | ICD-10-CM

## 2021-05-09 DIAGNOSIS — I77819 Aortic ectasia, unspecified site: Secondary | ICD-10-CM | POA: Diagnosis not present

## 2021-05-09 NOTE — Progress Notes (Unsigned)
Applied a 14 day Zio XT monitor to patient in the office ° °Dr. McAlhany to read °

## 2021-05-09 NOTE — Patient Instructions (Signed)
Medication Instructions:  ?Your physician recommends that you continue on your current medications as directed. Please refer to the Current Medication list given to you today. ? ?*If you need a refill on your cardiac medications before your next appointment, please call your pharmacy* ? ? ?Lab Work: ?None ?If you have labs (blood work) drawn today and your tests are completely normal, you will receive your results only by: ?MyChart Message (if you have MyChart) OR ?A paper copy in the mail ?If you have any lab test that is abnormal or we need to change your treatment, we will call you to review the results. ? ?Testing: ?Your physician has requested that you have an echocardiogram. Echocardiography is a painless test that uses sound waves to create images of your heart. It provides your doctor with information about the size and shape of your heart and how well your heart?s chambers and valves are working. This procedure takes approximately one hour. There are no restrictions for this procedure. ? ? ?Follow-Up: ?At [facility], you and your health needs are our priority.  As part of our continuing mission to provide you with exceptional heart care, we have created designated Provider Care Teams.  These Care Teams include your primary Cardiologist (physician) and Advanced Practice Providers (APPs -  Physician Assistants and Nurse Practitioners) who all work together to provide you with the care you need, when you need it. ? ?We recommend signing up for the patient portal called "MyChart".  Sign up information is provided on this After Visit Summary.  MyChart is used to connect with patients for Virtual Visits (Telemedicine).  Patients are able to view lab/test results, encounter notes, upcoming appointments, etc.  Non-urgent messages can be sent to your provider as well.   ?To learn more about what you can do with MyChart, go to [email or web address].   ? ?Your next appointment:   ?1 year(s) ? ?The format for  your next appointment:   ?In Person ? ?Provider:   ?Lauree Chandler, MD   ? ? ?Other Instructions ?Decrease amount of Tea you are drinking ? ? ?ZIO XT- Long Term Monitor Instructions ? ?Your physician has requested you wear a ZIO patch monitor for 14 days.  ?This is a single patch monitor. [employer] supplies one patch monitor per enrollment. Additional ?stickers are not available. Please do not apply patch if you will be having a Nuclear Stress Test,  ?Echocardiogram, Cardiac CT, MRI, or Chest Xray during the period you would be wearing the  ?monitor. The patch cannot be worn during these tests. You cannot remove and re-apply the  ?ZIO XT patch monitor.  ?Your ZIO patch monitor will be mailed 3 day USPS to your address on file. It may take 3-5 days  ?to receive your monitor after you have been enrolled.  ?Once you have received your monitor, please review the enclosed instructions. Your monitor  ?has already been registered assigning a specific monitor serial # to you. ? ?Billing and Patient Assistance Program Information ? ?We have supplied [employer] with any of your insurance information on file for billing purposes. ?[employer] offers a sliding scale Patient Assistance Program for patients that do not have  ?insurance, or whose insurance does not completely cover the cost of the ZIO monitor.  ?You must apply for the Patient Assistance Program to qualify for this discounted rate.  ?To apply, please call [employer] at [phone], select option 4, select option 2, ask to apply for  ?Patient Assistance Program. Theodore Demark will ask your  household income, and how many people  ?are in your household. They will quote your out-of-pocket cost based on that information.  ?[employer] will also be able to set up a 20-month interest-free payment plan if needed. ? ?Applying the monitor ?  ?Shave hair from upper left chest.  ?Hold abrader disc by orange tab. Rub abrader in 40 strokes over the upper left chest as  ?indicated in your  monitor instructions.  ?Clean area with 4 enclosed alcohol pads. Let dry.  ?Apply patch as indicated in monitor instructions. Patch will be placed under collarbone on left  ?side of chest with arrow pointing upward.  ?Rub patch adhesive wings for 2 minutes. Remove white label marked "1". Remove the white  ?label marked "2". Rub patch adhesive wings for 2 additional minutes.  ?While looking in a mirror, press and release button in center of patch. A small green light will  ?flash 3-4 times. This will be your only indicator that the monitor has been turned on.  ?Do not shower for the first 24 hours. You may shower after the first 24 hours.  ?Press the button if you feel a symptom. You will hear a small click. Record Date, Time and  ?Symptom in the Patient Logbook.  ?When you are ready to remove the patch, follow instructions on the last 2 pages of Patient  ?Logbook. Stick patch monitor onto the last page of Patient Logbook.  ?Place Patient Logbook in the blue and white box. Use locking tab on box and tape box closed  ?securely. The blue and white box has prepaid postage on it. Please place it in the mailbox as  ?soon as possible. Your physician should have your test results approximately 7 days after the  ?monitor has [facility]  ?Call [facility]at [phone] you have questions regarding  ?your ZIO XT patch monitor. Call them immediately if you see an orange light blinking on your  ?monitor.  ?If your monitor falls off in less than 4 days, contact our Monitor department at [phone]  ?If your monitor becomes loose or falls off after 4 days call [employer] at [phone]  ?suggestions on securing your monitor ?  ?

## 2021-05-16 ENCOUNTER — Other Ambulatory Visit: Payer: Self-pay | Admitting: Cardiovascular Disease

## 2021-05-28 ENCOUNTER — Ambulatory Visit (HOSPITAL_COMMUNITY): Payer: PPO | Attending: Cardiology

## 2021-05-28 DIAGNOSIS — I471 Supraventricular tachycardia: Secondary | ICD-10-CM | POA: Diagnosis not present

## 2021-05-28 DIAGNOSIS — I77819 Aortic ectasia, unspecified site: Secondary | ICD-10-CM | POA: Diagnosis not present

## 2021-05-28 LAB — ECHOCARDIOGRAM COMPLETE
Area-P 1/2: 2.76 cm2
P 1/2 time: 748 msec
S' Lateral: 3.2 cm

## 2021-05-30 DIAGNOSIS — I471 Supraventricular tachycardia: Secondary | ICD-10-CM | POA: Diagnosis not present

## 2021-05-31 DIAGNOSIS — H6123 Impacted cerumen, bilateral: Secondary | ICD-10-CM | POA: Diagnosis not present

## 2021-06-01 ENCOUNTER — Other Ambulatory Visit: Payer: Self-pay

## 2021-06-01 MED ORDER — METOPROLOL TARTRATE 25 MG PO TABS: 25.0000 mg | ORAL_TABLET | Freq: Two times a day (BID) | ORAL | 3 refills | Status: DC

## 2021-06-10 DIAGNOSIS — R001 Bradycardia, unspecified: Secondary | ICD-10-CM | POA: Insufficient documentation

## 2021-06-11 ENCOUNTER — Ambulatory Visit: Payer: PPO | Admitting: Internal Medicine

## 2021-06-11 ENCOUNTER — Encounter: Payer: Self-pay | Admitting: Internal Medicine

## 2021-06-11 ENCOUNTER — Other Ambulatory Visit: Payer: Self-pay | Admitting: *Deleted

## 2021-06-11 VITALS — BP 138/72 | HR 55 | Ht 70.0 in | Wt 157.8 lb

## 2021-06-11 DIAGNOSIS — I471 Supraventricular tachycardia: Secondary | ICD-10-CM

## 2021-06-11 DIAGNOSIS — D649 Anemia, unspecified: Secondary | ICD-10-CM

## 2021-06-11 DIAGNOSIS — R001 Bradycardia, unspecified: Secondary | ICD-10-CM | POA: Diagnosis not present

## 2021-06-11 MED ORDER — AMLODIPINE BESYLATE 5 MG PO TABS
7.5000 mg | ORAL_TABLET | Freq: Every day | ORAL | 3 refills | Status: DC
Start: 2021-06-11 — End: 2022-02-19

## 2021-06-11 MED ORDER — METOPROLOL TARTRATE 25 MG PO TABS: 12.5000 mg | ORAL_TABLET | Freq: Two times a day (BID) | ORAL | 3 refills | Status: DC

## 2021-06-11 NOTE — Progress Notes (Signed)
? ?Patient Care Team: ?Vivi Barrack, MD as PCP - General (Family Medicine) ?Burnell Blanks, MD as PCP - Cardiology (Cardiology) ?[name], MD as PCP - Electrophysiology (Cardiology) ?Macarthur Critchley, OD as Referring Physician (Optometry) ? ? ?HPI ? ?Jeff Graves is a 76 y.o. male Seen in followup for atrial tachycardia for which he previously took flecainide   ? ?Coronary artery disease and underwent bypass surgery 10/20.  ? ?The patient denies chest pain, shortness of breath, nocturnal dyspnea, orthopnea or peripheral edema.  There have been no palpitations or syncope.  Episode of dizziness  occurred at work  sat down and improved over about 15 min, residual fatigue for about 1-2 hrs and was similar to prodromes previously. has an Apple watch but did not happen to look at the heart rate ? ?DATE TEST EF   ?10/22 LHC    % 3V CAD  ?4/23 Echo  50-55%   ?     ? ? ? ?Date Cr K Hgb  ?3/19 1.28 4.3 13.7  ? 1/23 1.22 4.3 12.3   ? ?4/23 Event Recorder personnally reviewed PVCs-occasional 1.5%, PACs 2.5% ?Short runs of atrial tachycardia 4 beats--10 seconds with rates ranging 120-190 ? ?Overall fatigue over the last couple years and dyspnea on exertion. ? ?Past Medical History:  ?Diagnosis Date  ? Allergy   ? seasonal  ? Atrial tachycardia ([place]) 06/04/2012  ? 12/13-QRS duration-102 ms 6/14-QRS duration-98 ms   ? CAD (coronary artery disease)   ? a. s/p CABG 11/2018  ? CKD (chronic kidney disease), stage II   ? DDD (degenerative disc disease)   ? Diverticulosis of colon   ? Hemorrhoids   ? Hypercholesterolemia   ? Hypertension   ? NSVT (nonsustained ventricular tachycardia) ([place])   ? PAC (premature atrial contraction)   ? Pneumothorax   ? Postoperative atrial fibrillation (HCC)   ? Renal calculus   ? RENAL CALCULUS, HX OF 12/26/2006  ? Qualifier: Diagnosis of  By: June Leap    ? S/P rotator cuff surgery 04/15/2014  ? Shoulder pain   ? Sinus bradycardia   ? ? ?Past Surgical History:  ?Procedure Laterality  Date  ? COLONOSCOPY    ? CORONARY ARTERY BYPASS GRAFT N/A 11/23/2018  ? Procedure: CORONARY ARTERY BYPASS GRAFTING (CABG), ON PUMP, TIMES FOUR, LIMA to LAD, SEQ SVG to PDA, PLVB, SVG to CIRCUMFLEX, USING LEFT INTERNAL MAMMARY ARTERY AND RIGHT GREATER SAPHENOUS VEIN HARVESTED ENDOSCOPICALLY AND LEFT GREATER SAPHENOUS VEIN;  Surgeon: [name], MD;  Location: [facility];  Service: Open Heart Surgery;  Laterality: N/A;  ? LEFT HEART CATH AND CORONARY ANGIOGRAPHY N/A 11/18/2018  ? Procedure: LEFT HEART CATH AND CORONARY ANGIOGRAPHY;  Surgeon: Burnell Blanks, MD;  Location: [facility];  Service: Cardiovascular;  Laterality: N/A;  ? None    ? SHOULDER SURGERY Left 03/29/2014  ? TEE WITHOUT CARDIOVERSION N/A 11/23/2018  ? Procedure: TRANSESOPHAGEAL ECHOCARDIOGRAM (TEE);  Surgeon: [name], MD;  Location: [facility];  Service: Open Heart Surgery;  Laterality: N/A;  ? ? ?Current Outpatient Medications  ?Medication Sig Dispense Refill  ? amLODipine (NORVASC) 5 MG tablet Take 1.5 tablets (7.5 mg total) by mouth daily. 135 tablet 3  ? aspirin EC 81 MG tablet Take 81 mg by mouth daily. Swallow whole.    ? atorvastatin (LIPITOR) 80 MG tablet Take 1 tablet (80 mg total) by mouth daily. 90 tablet 3  ? Cholecalciferol (VITAMIN D) 2000 units CAPS Take 2,000  Units by mouth daily.    ? Cyanocobalamin (VITAMIN B-12) 5000 MCG LOZG Take 5,000 mcg by mouth daily.     ? loratadine (CLARITIN) 10 MG tablet Take 10 mg by mouth daily as needed for allergies.     ? metoprolol tartrate (LOPRESSOR) 25 MG tablet Take 1 tablet (25 mg total) by mouth 2 (two) times daily. 180 tablet 3  ? Multiple Vitamins-Minerals (MULTIVITAMIN WITH MINERALS) tablet Take 1 tablet by mouth daily.    ? ?No current facility-administered medications for this visit.  ? ? ?No Known Allergies ? ?Review of Systems negative except from HPI and PMH ? ?Physical Exam ?BP 138/72   Pulse (!) 55   Ht '5\' 10"'$  (1.778 m)   Wt 157 lb 12.8 oz (71.6 kg)   SpO2  90%   BMI 22.64 kg/m?  ?Well developed and nourished in no acute distress ?HENT normal ?Neck supple with JVP-  flat   ?Clear ?Regular rate and rhythm, no murmurs or gallops ?Abd-soft with active BS ?No Clubbing cyanosis edema ?Skin-warm and dry ?A & Oriented  Grossly normal sensory and motor function ? ?ECG sinus @ 55 ?19/09/42  ? ?Otherwise normal ? ?Assessment and  Plan ?  ? ?Atrial tachycardia-  ? ?Lightheadedness likely neurally mediated  ? ?Sinus bradycardia ? ?Ischemic heart disease with prior CABG ? ?Hypertension ? ? ? ?Blood pressure well controlled.  Continue him on amlodipine with his bradycardia we will decrease his metoprolol back to 12.5 twice daily. ? ?I suspect that his lightheadedness is neurally mediated.  Had a prodrome similar to prior events, the residual fatigue is characteristic.  I encouraged him to use his watch next time to help [place] assess heart rate and also to help record his heart rhythm if possible.  He will talk with his daughter ? ?No angina  continue ASA and amlodipine ?  ? ? ?  ? ?  ? ? ?

## 2021-06-11 NOTE — Patient Instructions (Addendum)
Medication Instructions:  ?Your physician has recommended you make the following change in your medication:  ? ?Decrease Metoprolol Tartrate to 1/2 tablet (12.'5mg'$ ) by mouth twice daily. ? ? ?*If you need a refill on your cardiac medications before your next appointment, please call your pharmacy* ? ? ?Lab Work: ?CBC 06/14/2021 - You may come anytime between 8am and 430pm. ?If you have labs (blood work) drawn today and your tests are completely normal, you will receive your results only by: ?MyChart Message (if you have MyChart) OR ?A paper copy in the mail ?If you have any lab test that is abnormal or we need to change your treatment, we will call you to review the results. ? ? ?Testing/Procedures: ?None ordered. ? ? ? ?Follow-Up: ?At [facility], you and your health needs are our priority.  As part of our continuing mission to provide you with exceptional heart care, we have created designated Provider Care Teams.  These Care Teams include your primary Cardiologist (physician) and Advanced Practice Providers (APPs -  Physician Assistants and Nurse Practitioners) who all work together to provide you with the care you need, when you need it. ? ?We recommend signing up for the patient portal called "MyChart".  Sign up information is provided on this After Visit Summary.  MyChart is used to connect with patients for Virtual Visits (Telemedicine).  Patients are able to view lab/test results, encounter notes, upcoming appointments, etc.  Non-urgent messages can be sent to your provider as well.   ?To learn more about what you can do with MyChart, go to [email or web address].   ? ?Your next appointment:   ?12 months with Dr Olin Pia PA ? ?Important Information About Sugar ? ? ? ? ?  ?

## 2021-06-14 ENCOUNTER — Other Ambulatory Visit: Payer: PPO

## 2021-06-14 DIAGNOSIS — D649 Anemia, unspecified: Secondary | ICD-10-CM | POA: Diagnosis not present

## 2021-06-14 LAB — CBC
Hematocrit: 35.6 % — ABNORMAL LOW (ref 37.5–51.0)
Hemoglobin: 12.4 g/dL — ABNORMAL LOW (ref 13.0–17.7)
MCH: 31.8 pg (ref 26.6–33.0)
MCHC: 34.8 g/dL (ref 31.5–35.7)
MCV: 91 fL (ref 79–97)
Platelets: 244 10*3/uL (ref 150–450)
RBC: 3.9 x10E6/uL — ABNORMAL LOW (ref 4.14–5.80)
RDW: 14.2 % (ref 11.6–15.4)
WBC: 6.2 10*3/uL (ref 3.4–10.8)

## 2021-07-03 ENCOUNTER — Telehealth: Payer: Self-pay

## 2021-07-03 NOTE — Telephone Encounter (Signed)
Spoke with pt and advised per Dr Caryl Comes, labs show nearly normal Hgb and continue follow up with PCP.  Pt verbalizes understanding and thanked Therapist, sports for the call.

## 2021-07-03 NOTE — Telephone Encounter (Signed)
-----   Message from [name], MD sent at 06/30/2021  6:06 AM EDT ----- Please Inform Patient that labs show nearly normal Hgb    He should follow with PCP  Thanks

## 2021-11-30 ENCOUNTER — Telehealth: Payer: Self-pay | Admitting: Family Medicine

## 2021-11-30 NOTE — Telephone Encounter (Signed)
LVM for pt to rtn my call to schedule AWV with [facility] call back # [phone] 

## 2021-12-10 ENCOUNTER — Ambulatory Visit (INDEPENDENT_AMBULATORY_CARE_PROVIDER_SITE_OTHER): Payer: PPO

## 2021-12-10 VITALS — BP 102/64 | HR 52 | Temp 97.7°F | Wt 156.8 lb

## 2021-12-10 DIAGNOSIS — Z0000 Encounter for general adult medical examination without abnormal findings: Secondary | ICD-10-CM

## 2021-12-10 NOTE — Progress Notes (Signed)
Subjective:   Jeff Graves is a 76 y.o. male who presents for Medicare Annual/Subsequent preventive examination.  Review of Systems     Cardiac Risk Factors include: advanced age (>39mn, >>32women);hypertension;dyslipidemia;male gender     Objective:    Today's Vitals   12/10/21 0934  BP: 102/64  Pulse: (!) 52  Temp: 97.7 F (36.5 C)  SpO2: 99%  Weight: 156 lb 12.8 oz (71.1 kg)   Body mass index is 22.5 kg/m.     12/10/2021    9:42 AM 06/08/2019    2:02 PM 11/18/2018    6:46 AM  Advanced Directives  Does Patient Have a Medical Advance Directive? Yes Yes No  Type of [place]amed[place]of [place] will Living will;Healthcare Power of Attorney   Does patient want to make changes to medical advance directive?  No - Patient declined   [place]? No - copy requested No - copy requested   Would patient like information on creating a medical advance directive?   No - Patient declined    Current Medications (verified) Outpatient Encounter Medications as of 12/10/2021  Medication Sig   amLODipine (NORVASC) 5 MG tablet Take 1.5 tablets (7.5 mg total) by mouth daily.   aspirin EC 81 MG tablet Take 81 mg by mouth daily. Swallow whole.   atorvastatin (LIPITOR) 80 MG tablet Take 1 tablet (80 mg total) by mouth daily.   Cholecalciferol (VITAMIN D) 2000 units CAPS Take 2,000 Units by mouth daily.   Cyanocobalamin (VITAMIN B-12) 5000 MCG LOZG Take 5,000 mcg by mouth daily.    loratadine (CLARITIN) 10 MG tablet Take 10 mg by mouth daily as needed for allergies.    metoprolol tartrate (LOPRESSOR) 25 MG tablet Take 0.5 tablets (12.5 mg total) by mouth 2 (two) times daily.   Multiple Vitamins-Minerals (MULTIVITAMIN WITH MINERALS) tablet Take 1 tablet by mouth daily.   Influenza vac split quadrivalent PF (FLUZONE HIGH-DOSE) 0.5 ML injection    No facility-administered encounter medications on file as of 12/10/2021.     Allergies (verified) Patient has no known allergies.   History: Past Medical History:  Diagnosis Date   Allergy    seasonal   Atrial tachycardia 06/04/2012   12/13-QRS duration-102 ms 6/14-QRS duration-98 ms    CAD (coronary artery disease)    a. s/p CABG 11/2018   CKD (chronic kidney disease), stage II    DDD (degenerative disc disease)    Diverticulosis of colon    Hemorrhoids    Hypercholesterolemia    Hypertension    NSVT (nonsustained ventricular tachycardia) (HCC)    PAC (premature atrial contraction)    Pneumothorax    [place]operative atrial fibrillation [place]    Renal calculus    RENAL CALCULUS, HX OF 12/26/2006   Qualifier: Diagnosis of  By: WJune Leap    S/P rotator cuff surgery 04/15/2014   Shoulder pain    Sinus bradycardia    Past Surgical History:  Procedure Laterality Date   COLONOSCOPY     CORONARY ARTERY BYPASS GRAFT N/A 11/23/2018   Procedure: CORONARY ARTERY BYPASS GRAFTING (CABG), ON PUMP, TIMES FOUR, LIMA to LAD, SEQ SVG to PDA, PLVB, SVG to CIRCUMFLEX, USING LEFT INTERNAL MAMMARY ARTERY AND RIGHT GREATER SAPHENOUS VEIN HARVESTED ENDOSCOPICALLY AND LEFT GREATER SAPHENOUS VEIN;  Surgeon: GGrace Isaac MD;  [place]  Service: Open Heart Surgery;  Laterality: N/A;   LEFT HEART CATH AND CORONARY ANGIOGRAPHY N/A 11/18/2018  Procedure: LEFT HEART CATH AND CORONARY ANGIOGRAPHY;  Surgeon: Burnell Blanks, MD;  Location: [facility];  Service: Cardiovascular;  Laterality: N/A;   None     SHOULDER SURGERY Left 03/29/2014   TEE WITHOUT CARDIOVERSION N/A 11/23/2018   Procedure: TRANSESOPHAGEAL ECHOCARDIOGRAM (TEE);  Surgeon: [name], MD;  [place] [facility];  Service: Open Heart Surgery;  Laterality: N/A;   Family History  Problem Relation Age of Onset   Heart attack Father 79   Valvular heart disease Mother 104   Lymphoma Sister        Deceased   Breast cancer Sister        Alive   Colon cancer Neg Hx    Esophageal  cancer Neg Hx    Rectal cancer Neg Hx    Stomach cancer Neg Hx    Social History   Socioeconomic History   Marital status: Married    Spouse name: brenda   Number of children: 1   Years of education: 12   Highest education level: High school graduate  Occupational History   Occupation: HVAC with air quality    Employer: [employer]  Tobacco Use   Smoking status: Former    Packs/day: 0.50    Years: 20.00    Total pack years: 10.00    Types: Cigarettes    Quit date: 02/12/1984    Years since quitting: 37.8   Smokeless tobacco: Never  Vaping Use   Vaping Use: Never used  Substance and Sexual Activity   Alcohol use: No    Alcohol/week: 0.0 standard drinks of alcohol    Comment: social use   Drug use: No   Sexual activity: Not on file  Other Topics Concern   Not on file  Social History Narrative   Not on file   Social Determinants of Health   Financial Resource Strain: Low Risk  (12/10/2021)   Overall Financial Resource Strain (CARDIA)    Difficulty of Paying Living Expenses: Not hard at all  Food Insecurity: No Food Insecurity (12/10/2021)   Hunger Vital Sign    Worried About Running Out of Food in the Last Year: Never true    Ran Out of Food in the Last Year: Never true  Transportation Needs: No Transportation Needs (12/10/2021)   PRAPARE - Hydrologist (Medical): No    Lack of Transportation (Non-Medical): No  Physical Activity: Sufficiently Active (12/10/2021)   Exercise Vital Sign    Days of Exercise per Week: 4 days    Minutes of Exercise per Session: 60 min  Stress: No Stress Concern Present (12/10/2021)   [place]    Feeling of Stress : Not at all  Social Connections: Moderately Integrated (12/10/2021)   Social Connection and Isolation Panel [NHANES]    Frequency of Communication with Friends and Family: More than three times a week    Frequency of  Social Gatherings with Friends and Family: More than three times a week    Attends Religious Services: More than 4 times per year    Active Member of [employer] or Organizations: No    Attends Archivist Meetings: Never    Marital Status: Married    Tobacco Counseling Counseling given: Not Answered   Clinical Intake:  Pre-visit preparation completed: Yes  Pain : No/denies pain     BMI - recorded: 22.5 Nutritional Status: BMI of 19-24  Normal Nutritional Risks: None Diabetes:  No  How often do you need to have someone help you when you read instructions, pamphlets, or other written materials from your doctor or pharmacy?: 1 - Never  Diabetic?no  Interpreter Needed?: No  Information entered by :: Charlott Rakes, LPN   Activities of Daily Living    12/10/2021    9:44 AM  In your present state of health, do you have any difficulty performing the following activities:  Hearing? 0  Vision? 0  Difficulty concentrating or making decisions? 0  Walking or climbing stairs? 0  Dressing or bathing? 0  Doing errands, shopping? 0  Preparing Food and eating ? N  Using the Toilet? N  In the past six months, have you accidently leaked urine? N  Do you have problems with loss of bowel control? N  Managing your Medications? N  Managing your Finances? N  Housekeeping or managing your Housekeeping? N    Patient Care Team: Vivi Barrack, MD as PCP - General (Family Medicine) Burnell Blanks, MD as PCP - Cardiology (Cardiology) [name], MD as PCP - Electrophysiology (Cardiology) Macarthur Critchley, [place] as Referring Physician (Optometry)  Indicate any recent Medical Services you may have received from other than [facility] providers in the past year (date may be approximate).     Assessment:   This is a routine wellness examination for Regina.  Hearing/Vision screen Hearing Screening - Comments:: Pt denies any  hearing  Vision Screening - Comments:: Pt follows up  with Dr Macarthur Critchley for annul eye exams   Dietary issues and exercise activities discussed: Current Exercise Habits: Home exercise routine, Type of exercise: Other - see comments, Time (Minutes): 30, Frequency (Times/Week): 4, Weekly Exercise (Minutes/Week): 120   Goals Addressed             This Visit's Progress    Patient Stated       Maintain health        Depression Screen    12/10/2021    9:40 AM 02/16/2021    8:23 AM 06/08/2019    2:04 PM 01/22/2019    2:27 PM 01/05/2019    3:12 PM 04/16/2016    9:13 AM 01/22/2013    8:00 AM  PHQ 2/9 Scores  PHQ - 2 Score 0 0 0 0 0 0 0    Fall Risk    12/10/2021    9:43 AM 02/16/2021    8:23 AM 06/08/2019    2:04 PM 01/22/2019    2:26 PM 01/05/2019    3:28 PM  Fall Risk   Falls in the past year? 0 0 0 0 0  Number falls in past yr: 0 0 0    Injury with Fall? 0 0 0    Risk for fall due to : Impaired vision      Follow up Falls prevention discussed Falls evaluation completed Falls evaluation completed;Education provided;Falls prevention discussed  Falls evaluation completed    FALL RISK PREVENTION PERTAINING TO THE HOME:  Any stairs in or around the home? Yes  If so, are there any without handrails? No  Home free of loose throw rugs in walkways, pet beds, electrical cords, etc? Yes  Adequate lighting in your home to reduce risk of falls? Yes   ASSISTIVE DEVICES UTILIZED TO PREVENT FALLS:  Life alert? No  Use of a cane, walker or w/c? No  Grab bars in the bathroom? No  Shower chair or bench in shower? No  Elevated toilet seat or a handicapped toilet?  No   TIMED UP AND GO:  Was the test performed? No .   Cognitive Function:        12/10/2021    9:44 AM 06/08/2019    2:03 PM  6CIT Screen  What Year? 0 points 0 points  What month? 0 points 0 points  What time? 0 points 0 points  Count back from 20 0 points 0 points  Months in reverse 0 points 0 points  Repeat phrase 0 points 0 points  Total Score 0 points 0 points     Immunizations Immunization History  Administered Date(s) Administered   H1N1 01/21/2008   Influenza Split 01/22/2011, 01/23/2012, 12/06/2012   Influenza Whole 01/21/2007, 01/20/2009, 12/05/2009   Influenza, High Dose Seasonal PF 10/30/2016, 01/03/2018   Influenza,inj,Quad PF,6+ Mos 12/15/2013, 11/12/2014   Influenza-Unspecified 12/09/2018, 11/12/2019, 12/08/2020   Moderna Sars-Covid-2 Vaccination 03/19/2019, 04/21/2019, 12/23/2019   Pneumococcal Conjugate-13 04/15/2014   Pneumococcal Polysaccharide-23 04/16/2016   Tdap 01/23/2012   Zoster Recombinat (Shingrix) 03/22/2017, 06/16/2017, 10/26/2017    TDAP status: Due, Education has been provided regarding the importance of this vaccine. Advised may receive this vaccine at local pharmacy or Health Dept. Aware to provide a copy of the vaccination record if obtained from local pharmacy or Health Dept. Verbalized acceptance and understanding.  Flu Vaccine status: Up to date  Pneumococcal vaccine status: Up to date  Covid-19 vaccine status: Completed vaccines  Qualifies for Shingles Vaccine? Yes   Zostavax completed Yes   Shingrix Completed?: Yes  Screening Tests Health Maintenance  Topic Date Due   COVID-19 Vaccine (4 - Moderna risk series) 02/17/2020   INFLUENZA VACCINE  09/11/2021   Hepatitis C Screening  06/07/2028 (Originally 02/23/1963)   TETANUS/TDAP  01/22/2022   Medicare Annual Wellness (AWV)  12/11/2022   COLONOSCOPY (Pts 45-26yr Insurance coverage will need to be confirmed)  04/03/2023   Pneumonia Vaccine 76 Years old  Completed   Zoster Vaccines- Shingrix  Completed   HPV VACCINES  Aged Out    Health Maintenance  Health Maintenance Due  Topic Date Due   COVID-19 Vaccine (4 - Moderna risk series) 02/17/2020   INFLUENZA VACCINE  09/11/2021    Colorectal cancer screening: Type of screening: Colonoscopy. Completed 04/02/13. Repeat every 10 years   Additional Screening:  Hepatitis C Screening: does  qualify;  Vision Screening: Recommended annual ophthalmology exams for early detection of glaucoma and other disorders of the eye. Is the patient up to date with their annual eye exam?  Yes  Who is the provider or what is the name of the office in which the patient attends annual eye exams?  Dr JWille Glaserscott If pt is not established with a provider, would they like to be referred to a provider to establish care? No .   Dental Screening: Recommended annual dental exams for proper oral hygiene  Community Resource Referral / Chronic Care Management: CRR required this visit?  No   CCM required this visit?  No      Plan:     I have personally reviewed and noted the following in the patient's chart:   Medical and social history Use of alcohol, tobacco or illicit drugs  Current medications and supplements including opioid prescriptions. Patient is not currently taking opioid prescriptions. Functional ability and status Nutritional status Physical activity Advanced directives List of other physicians Hospitalizations, surgeries, and ER visits in previous 12 months Vitals Screenings to include cognitive, depression, and falls Referrals and appointments  In addition, I have reviewed and  discussed with patient certain preventive protocols, quality metrics, and best practice recommendations. A written personalized care plan for preventive services as well as general preventive health recommendations were provided to patient.     Willette Brace, LPN   28/24/1753   Nurse Notes: none

## 2021-12-10 NOTE — Patient Instructions (Signed)
Jeff Graves , Thank you for taking time to come for your Medicare Wellness Visit. I appreciate your ongoing commitment to your health goals. Please review the following plan we discussed and let me know if I can assist you in the future.   These are the goals we discussed:  Goals      Patient Stated     Maintain health         This is a list of the screening recommended for you and due dates:  Health Maintenance  Topic Date Due   COVID-19 Vaccine (4 - Moderna risk series) 02/17/2020   Flu Shot  09/11/2021   Hepatitis C Screening: USPSTF Recommendation to screen - Ages 18-79 yo.  06/07/2028*   Tetanus Vaccine  01/22/2022   Medicare Annual Wellness Visit  12/11/2022   Colon Cancer Screening  04/03/2023   Pneumonia Vaccine  Completed   Zoster (Shingles) Vaccine  Completed   HPV Vaccine  Aged Out  *Topic was postponed. The date shown is not the original due date.    Advanced directives: Please bring a copy of your health care power of attorney and living will to the office at your convenience.  Conditions/risks identified: maintain  HEALTH   Next appointment: Follow up in one year for your annual wellness visit.    Preventive Care 37 Years and Older, Male  Preventive care refers to lifestyle choices and visits with your health care provider that can promote health and wellness. What does preventive care include? A yearly physical exam. This is also called an annual well check. Dental exams once or twice a year. Routine eye exams. Ask your health care provider how often you should have your eyes checked. Personal lifestyle choices, including: Daily care of your teeth and gums. Regular physical activity. Eating a healthy diet. Avoiding tobacco and drug use. Limiting alcohol use. Practicing safe sex. Taking low doses of aspirin every day. Taking vitamin and mineral supplements as recommended by your health care provider. What happens during an annual well check? The  services and screenings done by your health care provider during your annual well check will depend on your age, overall health, lifestyle risk factors, and family history of disease. Counseling  Your health care provider may ask you questions about your: Alcohol use. Tobacco use. Drug use. Emotional well-being. Home and relationship well-being. Sexual activity. Eating habits. History of falls. Memory and ability to understand (cognition). Work and work Statistician. Screening  You may have the following tests or measurements: Height, weight, and BMI. Blood pressure. Lipid and cholesterol levels. These may be checked every 5 years, or more frequently if you are over 39 years old. Skin check. Lung cancer screening. You may have this screening every year starting at age 34 if you have a 30-pack-year history of smoking and currently smoke or have quit within the past 15 years. Fecal occult blood test (FOBT) of the stool. You may have this test every year starting at age 72. Flexible sigmoidoscopy or colonoscopy. You may have a sigmoidoscopy every 5 years or a colonoscopy every 10 years starting at age 70. Prostate cancer screening. Recommendations will vary depending on your family history and other risks. Hepatitis C blood test. Hepatitis B blood test. Sexually transmitted disease (STD) testing. Diabetes screening. This is done by checking your blood sugar (glucose) after you have not eaten for a while (fasting). You may have this done every 1-3 years. Abdominal aortic aneurysm (AAA) screening. You may need this if you  are a current or former smoker. Osteoporosis. You may be screened starting at age 37 if you are at high risk. Talk with your health care provider about your test results, treatment options, and if necessary, the need for more tests. Vaccines  Your health care provider may recommend certain vaccines, such as: Influenza vaccine. This is recommended every year. Tetanus,  diphtheria, and acellular pertussis (Tdap, Td) vaccine. You may need a Td booster every 10 years. Zoster vaccine. You may need this after age 26. Pneumococcal 13-valent conjugate (PCV13) vaccine. One dose is recommended after age 3. Pneumococcal polysaccharide (PPSV23) vaccine. One dose is recommended after age 58. Talk to your health care provider about which screenings and vaccines you need and how often you need them. This information is not intended to replace advice given to you by your health care provider. Make sure you discuss any questions you have with your health care provider. Document Released: 02/24/2015 Document Revised: 10/18/2015 Document Reviewed: 11/29/2014 Elsevier Interactive Patient Education  2017 [place] Prevention in the Home Falls can cause injuries. They can happen to people of all ages. There are many things you can do to make your home safe and to help prevent falls. What can I do on the outside of my home? Regularly fix the edges of walkways and driveways and fix any cracks. Remove anything that might make you trip as you walk through a door, such as a raised step or threshold. Trim any bushes or trees on the path to your home. Use bright outdoor lighting. Clear any walking paths of anything that might make someone trip, such as rocks or tools. Regularly check to see if handrails are loose or broken. Make sure that both sides of any steps have handrails. Any raised decks and porches should have guardrails on the edges. Have any leaves, snow, or ice cleared regularly. Use sand or salt on walking paths during winter. Clean up any spills in your garage right away. This includes oil or grease spills. What can I do in the bathroom? Use night lights. Install grab bars by the toilet and in the tub and shower. Do not use towel bars as grab bars. Use non-skid mats or decals in the tub or shower. If you need to sit down in the shower, use a plastic,  non-slip stool. Keep the floor dry. Clean up any water that spills on the floor as soon as it happens. Remove soap buildup in the tub or shower regularly. Attach bath mats securely with double-sided non-slip rug tape. Do not have throw rugs and other things on the floor that can make you trip. What can I do in the bedroom? Use night lights. Make sure that you have a light by your bed that is easy to reach. Do not use any sheets or blankets that are too big for your bed. They should not hang down onto the floor. Have a firm chair that has side arms. You can use this for support while you get dressed. Do not have throw rugs and other things on the floor that can make you trip. What can I do in the kitchen? Clean up any spills right away. Avoid walking on wet floors. Keep items that you use a lot in easy-to-reach places. If you need to reach something above you, use a strong step stool that has a grab bar. Keep electrical cords out of the way. Do not use floor polish or wax that makes floors slippery. If you  must use wax, use non-skid floor wax. Do not have throw rugs and other things on the floor that can make you trip. What can I do with my stairs? Do not leave any items on the stairs. Make sure that there are handrails on both sides of the stairs and use them. Fix handrails that are broken or loose. Make sure that handrails are as long as the stairways. Check any carpeting to make sure that it is firmly attached to the stairs. Fix any carpet that is loose or worn. Avoid having throw rugs at the top or bottom of the stairs. If you do have throw rugs, attach them to the floor with carpet tape. Make sure that you have a light switch at the top of the stairs and the bottom of the stairs. If you do not have them, ask someone to add them for you. What else can I do to help prevent falls? Wear shoes that: Do not have high heels. Have rubber bottoms. Are comfortable and fit you well. Are closed  at the toe. Do not wear sandals. If you use a stepladder: Make sure that it is fully opened. Do not climb a closed stepladder. Make sure that both sides of the stepladder are locked into place. Ask someone to hold it for you, if possible. Clearly mark and make sure that you can see: Any grab bars or handrails. First and last steps. Where the edge of each step is. Use tools that help you move around (mobility aids) if they are needed. These include: Canes. Walkers. Scooters. Crutches. Turn on the lights when you go into a dark area. Replace any light bulbs as soon as they burn out. Set up your furniture so you have a clear path. Avoid moving your furniture around. If any of your floors are uneven, fix them. If there are any pets around you, be aware of where they are. Review your medicines with your doctor. Some medicines can make you feel dizzy. This can increase your chance of falling. Ask your doctor what other things that you can do to help prevent falls. This information is not intended to replace advice given to you by your health care provider. Make sure you discuss any questions you have with your health care provider. Document Released: 11/24/2008 Document Revised: 07/06/2015 Document Reviewed: 03/04/2014 Elsevier Interactive Patient Education  2017 [employer].

## 2022-02-18 ENCOUNTER — Other Ambulatory Visit: Payer: Self-pay | Admitting: Physician Assistant

## 2022-03-27 ENCOUNTER — Ambulatory Visit (INDEPENDENT_AMBULATORY_CARE_PROVIDER_SITE_OTHER): Payer: PPO | Admitting: Physician Assistant

## 2022-03-27 ENCOUNTER — Encounter: Payer: Self-pay | Admitting: Physician Assistant

## 2022-03-27 VITALS — BP 110/70 | HR 56 | Temp 97.5°F | Ht 70.0 in | Wt 157.0 lb

## 2022-03-27 DIAGNOSIS — J0100 Acute maxillary sinusitis, unspecified: Secondary | ICD-10-CM | POA: Diagnosis not present

## 2022-03-27 DIAGNOSIS — R509 Fever, unspecified: Secondary | ICD-10-CM

## 2022-03-27 LAB — POC COVID19 BINAXNOW: SARS Coronavirus 2 Ag: NEGATIVE

## 2022-03-27 MED ORDER — PREDNISONE 20 MG PO TABS: 40.0000 mg | ORAL_TABLET | Freq: Every day | ORAL | 0 refills | Status: AC

## 2022-03-27 MED ORDER — AMOXICILLIN-POT CLAVULANATE 875-125 MG PO TABS: 1.0000 | ORAL_TABLET | Freq: Two times a day (BID) | ORAL | 0 refills | Status: AC

## 2022-03-27 NOTE — Progress Notes (Addendum)
Subjective:    Patient ID: Jeff Graves, male    DOB: October 03, 1945, 77 y.o.   MRN: [id number]  Chief Complaint  Patient presents with   Cough    Pt in the office for cough and congestion, also has low grade fever; pt states it has been a week with symptoms and taking OTC meds but no helping; not tested for Covid; pt also states slight headaches and sinus pressure, pt was exposed to Covid less than a week ago as well    Cough   Patient is in today for acute sick symptoms. Cough and congestion lingering for the last week. Sinus pressure and headache. Slight low fever at home. Taking Mucinex at home.   Sister currently in the hospital with COVID-19; pt was exposed to COVID through her last week.   Past Medical History:  Diagnosis Date   Allergy    seasonal   Atrial tachycardia 06/04/2012   12/13-QRS duration-102 ms 6/14-QRS duration-98 ms    CAD (coronary artery disease)    a. s/p CABG 11/2018   CKD (chronic kidney disease), stage II    DDD (degenerative disc disease)    Diverticulosis of colon    Hemorrhoids    Hypercholesterolemia    Hypertension    NSVT (nonsustained ventricular tachycardia) (HCC)    PAC (premature atrial contraction)    Pneumothorax    Postoperative atrial fibrillation ([place])    Renal calculus    RENAL CALCULUS, HX OF 12/26/2006   Qualifier: Diagnosis of  By: June Leap     S/P rotator cuff surgery 04/15/2014   Shoulder pain    Sinus bradycardia     Past Surgical History:  Procedure Laterality Date   COLONOSCOPY     CORONARY ARTERY BYPASS GRAFT N/A 11/23/2018   Procedure: CORONARY ARTERY BYPASS GRAFTING (CABG), ON PUMP, TIMES FOUR, LIMA to LAD, SEQ SVG to PDA, PLVB, SVG to CIRCUMFLEX, USING LEFT INTERNAL MAMMARY ARTERY AND RIGHT GREATER SAPHENOUS VEIN HARVESTED ENDOSCOPICALLY AND LEFT GREATER SAPHENOUS VEIN;  Surgeon: [name], MD;  Location: [facility];  Service: Open Heart Surgery;  Laterality: N/A;   LEFT HEART CATH AND CORONARY ANGIOGRAPHY  N/A 11/18/2018   Procedure: LEFT HEART CATH AND CORONARY ANGIOGRAPHY;  Surgeon: Burnell Blanks, MD;  Location: [facility];  Service: Cardiovascular;  Laterality: N/A;   None     SHOULDER SURGERY Left 03/29/2014   TEE WITHOUT CARDIOVERSION N/A 11/23/2018   Procedure: TRANSESOPHAGEAL ECHOCARDIOGRAM (TEE);  Surgeon: [name], MD;  Location: [facility];  Service: Open Heart Surgery;  Laterality: N/A;    Family History  Problem Relation Age of Onset   Heart attack Father 42   Valvular heart disease Mother 25   Lymphoma Sister        Deceased   Breast cancer Sister        Alive   Colon cancer Neg Hx    Esophageal cancer Neg Hx    Rectal cancer Neg Hx    Stomach cancer Neg Hx     Social History   Tobacco Use   Smoking status: Former    Packs/day: 0.50    Years: 20.00    Total pack years: 10.00    Types: Cigarettes    Quit date: 02/12/1984    Years since quitting: 38.1   Smokeless tobacco: Never  Vaping Use   Vaping Use: Never used  Substance Use Topics   Alcohol use: No    Alcohol/week: 0.0 standard drinks  of alcohol    Comment: social use   Drug use: No     No Known Allergies  Review of Systems  Respiratory:  Positive for cough.    NEGATIVE UNLESS OTHERWISE INDICATED IN HPI      Objective:     BP 110/70 (BP Location: Left Arm)   Pulse (!) 56   Temp (!) 97.5 F (36.4 C) (Temporal)   Ht 5' 10"$  (1.778 m)   Wt 157 lb (71.2 kg)   SpO2 98%   BMI 22.53 kg/m   Wt Readings from Last 3 Encounters:  03/27/22 157 lb (71.2 kg)  12/10/21 156 lb 12.8 oz (71.1 kg)  06/11/21 157 lb 12.8 oz (71.6 kg)    BP Readings from Last 3 Encounters:  03/27/22 110/70  12/10/21 102/64  06/11/21 138/72     Physical Exam Vitals and nursing note reviewed.  Constitutional:      General: He is not in acute distress.    Appearance: Normal appearance. He is not ill-appearing.  HENT:     Head: Normocephalic.     Right Ear: Tympanic membrane, ear canal and  external ear normal.     Left Ear: Tympanic membrane, ear canal and external ear normal.     Nose: Congestion present.     Right Sinus: Maxillary sinus tenderness present.     Left Sinus: Maxillary sinus tenderness present.     Mouth/Throat:     Mouth: Mucous membranes are moist.     Pharynx: No oropharyngeal exudate or posterior oropharyngeal erythema.  Eyes:     Extraocular Movements: Extraocular movements intact.     Conjunctiva/sclera: Conjunctivae normal.     Pupils: Pupils are equal, round, and reactive to light.  Cardiovascular:     Rate and Rhythm: Normal rate and regular rhythm.     Pulses: Normal pulses.     Heart sounds: Normal heart sounds. No murmur heard. Pulmonary:     Effort: Pulmonary effort is normal. No respiratory distress.     Breath sounds: Normal breath sounds. No wheezing.  Musculoskeletal:     Cervical back: Normal range of motion.  Skin:    General: Skin is warm.  Neurological:     Mental Status: He is alert and oriented to person, place, and time.  Psychiatric:        Mood and Affect: Mood normal.        Behavior: Behavior normal.        Assessment & Plan:  Acute maxillary sinusitis, recurrence not specified -     POC COVID-19 BinaxNow  Fever, unspecified fever cause -     POC COVID-19 BinaxNow  Other orders -     predniSONE; Take 2 tablets (40 mg total) by mouth daily with breakfast for 5 days.  Dispense: 10 tablet; Refill: 0 -     Amoxicillin-Pot Clavulanate; Take 1 tablet by mouth 2 (two) times daily for 7 days.  Dispense: 14 tablet; Refill: 0    Persistent symptoms >7 days despite conservative efforts at home. POC COVID-19 testing negative in office. Will Rx Augmentin at this time, take with food. Cautioned on antibiotic use and possible side effects. Advised nasal saline, humidifier, and pushing fluids. Prednisone for added relief. Take as directed. Call if worse or no improvement.      Return if symptoms worsen or fail to  improve.  This note was prepared with assistance of Systems analyst. Occasional wrong-word or sound-a-like substitutions may have occurred due to the  inherent limitations of voice recognition software.     Paityn Balsam M Christle Nolting, PA-C

## 2022-03-31 NOTE — Progress Notes (Unsigned)
Office Visit    Patient Name: Jeff Graves Date of Encounter: 04/01/2022  PCP:  Vivi Barrack, [place]  Cardiologist:  Lauree Chandler, MD  Advanced Practice Provider:  No care team member to display Electrophysiologist:  Virl Axe, MD   HPI    Jeff Graves is a 77 y.o. male with a past medical history of atrial tachycardia, CAD status post CABG 11/2018 with postop PTX and PAF (previously on amiodarone), HTN, HLD, CKD stage II-IIIa, sinus bradycardia, mild dilatation of aorta. Presents today for follow-up appointment.  ZIO monitor showed predominantly normal sinus rhythm with episodes NSVT (max 7 beats), 42 runs of SVT (max 10 beats), some episodes were atrial tach with variable block, PACs/PVCs were present.  PVC burden 2.5%, average heart rate 61, minimal heart rate 41, max 187 bpm.  Dr. Angelena Form suggested continuation of beta-blocker.  2D echo showed EF 60 to 65%, mild LVH, mild dilatation of aortic root and ascending aorta.  Patient saw Melina Copa, PA-C 04/2020 and BP was running high.  Amlodipine was started and titrated up.  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  He had a couple episodes of lightheadedness and exhaustion when going up and down attic steps and when he sits down it passes.  His watch shows heart rates in the 70s to 80s.  Does not feel his heart racing or skipping.  No chest tightness.  Tries to stay hydrated.  Still exercising 30 minutes cardio 4 to 5 days a week.  No symptoms.  Previously on flecainide and followed by Dr. Caryl Comes before surgery.  Drinks 5 to 6 glasses of half sweet half unsweet tea daily.  Today, he states that he retired in the winter.  He has been doing well from a heart standpoint as far as he knows.  There has been no change.  Last lipid panel from a year ago LDL 49, triglycerides 40.  He continues on Lipitor 80 mg daily.  He wants  to know if he can be on a lesser dose or different medication for his cholesterol.  We went over several options including Zetia, Repatha, Praluent, Leqvio.  He continues to exercise regularly.  He is excited to spend time with his family and his retirement.  Reports no shortness of breath nor dyspnea on exertion. Reports no chest pain, pressure, or tightness. No edema, orthopnea, PND. Reports no palpitations.   Past Medical History    Past Medical History:  Diagnosis Date   Allergy    seasonal   Atrial tachycardia 06/04/2012   12/13-QRS duration-102 ms 6/14-QRS duration-98 ms    CAD (coronary artery disease)    a. s/p CABG 11/2018   CKD (chronic kidney disease), stage II    DDD (degenerative disc disease)    Diverticulosis of colon    Hemorrhoids    Hypercholesterolemia    Hypertension    NSVT (nonsustained ventricular tachycardia) (HCC)    PAC (premature atrial contraction)    Pneumothorax    Postoperative atrial fibrillation ([place])    Renal calculus    RENAL CALCULUS, HX OF 12/26/2006   Qualifier: Diagnosis of  By: June Leap     S/P rotator cuff surgery 04/15/2014   Shoulder pain    Sinus bradycardia    Past Surgical History:  Procedure Laterality Date   COLONOSCOPY     CORONARY ARTERY BYPASS GRAFT N/A  11/23/2018   Procedure: CORONARY ARTERY BYPASS GRAFTING (CABG), ON PUMP, TIMES FOUR, LIMA to LAD, SEQ SVG to PDA, PLVB, SVG to CIRCUMFLEX, USING LEFT INTERNAL MAMMARY ARTERY AND RIGHT GREATER SAPHENOUS VEIN HARVESTED ENDOSCOPICALLY AND LEFT GREATER SAPHENOUS VEIN;  Surgeon: [name], MD;  Location: [facility];  Service: Open Heart Surgery;  Laterality: N/A;   LEFT HEART CATH AND CORONARY ANGIOGRAPHY N/A 11/18/2018   Procedure: LEFT HEART CATH AND CORONARY ANGIOGRAPHY;  Surgeon: Burnell Blanks, MD;  Location: [facility];  Service: Cardiovascular;  Laterality: N/A;   None     SHOULDER SURGERY Left 03/29/2014   TEE WITHOUT CARDIOVERSION N/A 11/23/2018    Procedure: TRANSESOPHAGEAL ECHOCARDIOGRAM (TEE);  Surgeon: [name], MD;  Location: [facility];  Service: Open Heart Surgery;  Laterality: N/A;    Allergies  No Known Allergies   EKGs/Labs/Other Studies Reviewed:   The following studies were reviewed today: 2D echo 04/2020  1. Left ventricular ejection fraction, by estimation, is 60 to 65%. The  left ventricle has normal function. The left ventricle has no regional  wall motion abnormalities. There is mild left ventricular hypertrophy.  Left ventricular diastolic parameters  are indeterminate.   2. Right ventricular systolic function is mildly reduced. The right  ventricular size is normal. There is normal pulmonary artery systolic  pressure.   3. Left atrial size was moderately dilated.   4. The mitral valve is normal in structure. Trivial mitral valve  regurgitation.   5. The aortic valve is tricuspid. Aortic valve regurgitation is mild. No  aortic stenosis is present.   6. Aortic dilatation noted. There is mild dilatation of the aortic root,  measuring 40 mm. There is dilatation of the ascending aorta, measuring 41  mm.    Zio Monitor 03/2020 Patient had a min HR of 41 bpm, max HR of 187 bpm, and avg HR of 61 bpm. Predominant underlying rhythm was Sinus Rhythm. 2 Ventricular Tachycardia runs occurred, the run with the fastest interval lasting 4 beats with a max rate of 184 bpm, the longest  lasting 7 beats with an avg rate of 116 bpm. 42 Supraventricular Tachycardia runs occurred, the run with the fastest interval lasting 5 beats with a max rate of 187 bpm, the longest lasting 10 beats with an avg rate of 141 bpm. Some episodes of  Supraventricular Tachycardia may be possible Atrial Tachycardia with variable block. Isolated SVEs were occasional (2.8%, 17067), SVE Couplets were rare (<1.0%, 1638), and SVE Triplets were rare (<1.0%, 236). Isolated VEs were occasional (2.5%, 15535),  VE Couplets were rare (<1.0%, 509), and VE  Triplets were rare (<1.0%, 9).   ========== TEE 11/2018 POST-OP IMPRESSIONS  - Left Ventricle: The left ventricle is unchanged from pre-bypass.  - Right Ventricle: The right ventricle appears unchanged from pre-bypass.  - Aorta: The aorta appears unchanged from pre-bypass.  - Left Atrium: The left atrium appears unchanged from pre-bypass.  - Left Atrial Appendage: The left atrial appendage appears unchanged from  pre-bypass.  - Aortic Valve: The aortic valve appears unchanged from pre-bypass.  - Mitral Valve: The mitral valve appears unchanged from pre-bypass.  - Tricuspid Valve: The tricuspid valve appears unchanged from pre-bypass.  - Interatrial Septum: The interatrial septum appears unchanged from  pre-bypass.  - Interventricular Septum: The interventricular septum appears unchanged  from  pre-bypass.  - Pericardium: The pericardium appears unchanged from pre-bypass.   SUMMARY     Post Bypass:     - LVEF >  50% (CO > 5 L/min, CI > 2.5) w/ Dopamine gtt  - Mild improvement of RWMA's  - Tricuspid, Pulmonic, Mitral and Aortic valve unchanged  - No aortic dissection noted after cannula removed   PRE-OP FINDINGS   Left Ventricle: The left ventricle has normal systolic function, with an  ejection fraction of 55-60%. The cavity size was normal. There is mildly  increased left ventricular wall thickness.   Right Ventricle: The right ventricle has normal systolic function. The  cavity was normal. There is increased right ventricular wall thickness.   Left Atrium: Left atrial size was normal in size. The left atrial  appendage is well visualized and there is no evidence of thrombus present.  Left atrial appendage velocity is normal at greater than 40 cm/s.   Right Atrium: Right atrial size was normal in size. Right atrial pressure  is estimated at 10 mmHg.   Interatrial Septum: No atrial level shunt detected by color flow Doppler.   Pericardium: There is no evidence of  pericardial effusion. There is no  evidence of cardiac tamponade. There is no pleural effusion.   Mitral Valve: The mitral valve is normal in structure. Mild thickening of  the mitral valve leaflet. Mild calcification of the mitral valve leaflet.  Mitral valve regurgitation is mild by color flow Doppler. The MR jet is  centrally-directed. There is no  evidence of mitral valve vegetation.   Tricuspid Valve: The tricuspid valve was normal in structure. Tricuspid  valve regurgitation was not visualized by color flow Doppler. No TV  vegetation was visualized.   Aortic Valve: The aortic valve is tricuspid There is Mild thickening of  the aortic valve Aortic valve regurgitation is trivial by color flow  Doppler. There is no evidence of aortic valve stenosis. There is no  evidence of a vegetation on the aortic valve.   Pulmonic Valve: The pulmonic valve was normal in structure No evidence of  pumonic stenosis.  Pulmonic valve regurgitation is trivial by color flow Doppler.    Aorta: The aortic root, ascending aorta and aortic arch are normal in size  and structure. There is evidence of protruding and layered plaque in the  descending aorta; Grade II, measuring 2-73m in size.    2D Echo 11/2018  1. Left ventricular ejection fraction, by visual estimation, is 60 to  65%. The left ventricle has normal function. Normal left ventricular size.  Left ventricular septal wall thickness was mildly increased. There is  mildly increased left ventricular  hypertrophy.   2. Global right ventricle has normal systolic function.The right  ventricular size is normal. No increase in right ventricular wall  thickness.   3. Left atrial size was normal.   4. Right atrial size was normal.   5. The mitral valve is normal in structure. Mild mitral valve  regurgitation. No evidence of mitral stenosis.   6. The tricuspid valve is normal in structure. Tricuspid valve  regurgitation was not visualized by color  flow Doppler.   7. The aortic valve is tricuspid Aortic valve regurgitation is mild by  color flow Doppler. Mild aortic valve sclerosis without stenosis.   8. The pulmonic valve was normal in structure. Pulmonic valve  regurgitation is not visualized by color flow Doppler.   9. The inferior vena cava is normal in size with greater than 50%  respiratory variability, suggesting right atrial pressure of 3 mmHg.    Cardiac Cath 11/2018 Prox RCA to Dist RCA lesion is 40% stenosed. Dist RCA lesion  is 60% stenosed. RPAV lesion is 95% stenosed. Prox Cx lesion is 80% stenosed. Ost LAD to Prox LAD lesion is 99% stenosed. Prox LAD to Mid LAD lesion is 80% stenosed.   1. Severe triple vessel CAD with heavily calcified vessels.  2. Severe stenosis ostial and mid LAD 3. Severe stenosis proximal circumflex 4. Severe stenosis large caliber posterolateral artery.    Recommendations: Will consult CT surgery for CABG given severe three vessel CAD in heavily calcified coronary arteries. Continue ASA and statin.     EKG:  EKG is not ordered today.  \  Recent Labs: 06/14/2021: Hemoglobin 12.4; Platelets 244  Recent Lipid Panel    Component Value Date/Time   CHOL 125 02/16/2021 0846   CHOL 140 02/18/2020 0857   TRIG 40.0 02/16/2021 0846   HDL 67.70 02/16/2021 0846   HDL 87 02/18/2020 0857   CHOLHDL 2 02/16/2021 0846   VLDL 8.0 02/16/2021 0846   LDLCALC 49 02/16/2021 0846   LDLCALC 45 02/18/2020 0857    Home Medications   Current Meds  Medication Sig   amLODipine (NORVASC) 5 MG tablet TAKE 1 TABLET BY MOUTH DAILY   amoxicillin-clavulanate (AUGMENTIN) 875-125 MG tablet Take 1 tablet by mouth 2 (two) times daily for 7 days.   aspirin EC 81 MG tablet Take 81 mg by mouth daily. Swallow whole.   atorvastatin (LIPITOR) 80 MG tablet Take 1 tablet (80 mg total) by mouth daily.   Cholecalciferol (VITAMIN D) 2000 units CAPS Take 2,000 Units by mouth daily.   Cyanocobalamin (VITAMIN B-12) 5000 MCG LOZG  Take 5,000 mcg by mouth daily.    Influenza vac split quadrivalent PF (FLUZONE HIGH-DOSE) 0.5 ML injection    loratadine (CLARITIN) 10 MG tablet Take 10 mg by mouth daily as needed for allergies.    metoprolol tartrate (LOPRESSOR) 25 MG tablet Take 0.5 tablets (12.5 mg total) by mouth 2 (two) times daily.   Multiple Vitamins-Minerals (MULTIVITAMIN WITH MINERALS) tablet Take 1 tablet by mouth daily.   predniSONE (DELTASONE) 20 MG tablet Take 2 tablets (40 mg total) by mouth daily with breakfast for 5 days.     Review of Systems      All other systems reviewed and are otherwise negative except as noted above.  Physical Exam    VS:  BP 128/78   Pulse (!) 50   Ht 5' 10"$  (1.778 m)   Wt 160 lb 6.4 oz (72.8 kg)   SpO2 98%   BMI 23.02 kg/m  , BMI Body mass index is 23.02 kg/m.  Wt Readings from Last 3 Encounters:  04/01/22 160 lb 6.4 oz (72.8 kg)  03/27/22 157 lb (71.2 kg)  12/10/21 156 lb 12.8 oz (71.1 kg)     GEN: Well nourished, well developed, in no acute distress. HEENT: normal. Neck: Supple, no JVD, carotid bruits, or masses. Cardiac: sinus bradycardia, no murmurs, rubs, or gallops. No clubbing, cyanosis, edema.  Radials/PT 2+ and equal bilaterally.  Respiratory:  Respirations regular and unlabored, clear to auscultation bilaterally. GI: Soft, nontender, nondistended. MS: No deformity or atrophy. Skin: Warm and dry, no rash. Neuro:  Strength and sensation are intact. Psych: Normal affect.  Assessment & Plan    CAD in native artery -no chest pain or SOB -echo from 4/23 reviewed -Continue GDMT: Amlodipine 5 mg daily, aspirin 81 mg daily, Lipitor 80 mg daily, Lopressor 12.5 mg twice daily  PSVT/NSVT -no symptoms at this time -he has an appointment with Dr. Caryl Comes in May -last EKG 5/23  reviewed  Hypertension -well controlled today -continue current medication regimen  Hyperlipidemia -will plan for lipid panel and LFTs -continue statin for now 56m, may consider  dropping to 432m  Dilatation of the aorta -echo next year, 40 mm  -continue tight control of BP         Disposition: Follow up 1 year with ChLauree ChandlerMD or APP.  Signed, TeElgie CollardPA-C 04/01/2022, 11:00 AM [place]

## 2022-04-01 ENCOUNTER — Encounter: Payer: Self-pay | Admitting: Physician Assistant

## 2022-04-01 ENCOUNTER — Ambulatory Visit: Payer: HMO | Attending: Physician Assistant | Admitting: Physician Assistant

## 2022-04-01 VITALS — BP 128/78 | HR 50 | Ht 70.0 in | Wt 160.4 lb

## 2022-04-01 DIAGNOSIS — I77819 Aortic ectasia, unspecified site: Secondary | ICD-10-CM | POA: Diagnosis not present

## 2022-04-01 DIAGNOSIS — I2510 Atherosclerotic heart disease of native coronary artery without angina pectoris: Secondary | ICD-10-CM | POA: Diagnosis not present

## 2022-04-01 DIAGNOSIS — I4729 Other ventricular tachycardia: Secondary | ICD-10-CM | POA: Diagnosis not present

## 2022-04-01 DIAGNOSIS — I10 Essential (primary) hypertension: Secondary | ICD-10-CM | POA: Diagnosis not present

## 2022-04-01 DIAGNOSIS — I471 Supraventricular tachycardia, unspecified: Secondary | ICD-10-CM

## 2022-04-01 DIAGNOSIS — E785 Hyperlipidemia, unspecified: Secondary | ICD-10-CM | POA: Diagnosis not present

## 2022-04-01 NOTE — Patient Instructions (Signed)
Medication Instructions:  Your physician recommends that you continue on your current medications as directed. Please refer to the Current Medication list given to you today.  *If you need a refill on your cardiac medications before your next appointment, please call your pharmacy*   Lab Work: Fasting lipid panel and lft's tomorrow, you can come anytime between 7:15 AM and 4:30 PM If you have labs (blood work) drawn today and your tests are completely normal, you will receive your results only by: [facility] (if you have MyChart) OR A paper copy in the mail If you have any lab test that is abnormal or we need to change your treatment, we will call you to review the results.   Follow-Up: At [facility], you and your health needs are our priority.  As part of our continuing mission to provide you with exceptional heart care, we have created designated Provider Care Teams.  These Care Teams include your primary Cardiologist (physician) and Advanced Practice Providers (APPs -  Physician Assistants and Nurse Practitioners) who all work together to provide you with the care you need, when you need it.  We recommend signing up for the patient portal called "MyChart".  Sign up information is provided on this After Visit Summary.  MyChart is used to connect with patients for Virtual Visits (Telemedicine).  Patients are able to view lab/test results, encounter notes, upcoming appointments, etc.  Non-urgent messages can be sent to your provider as well.   To learn more about what you can do with MyChart, go to [email or web address].    Your next appointment:   1 year(s)  Provider:   Lauree Chandler, MD

## 2022-04-02 ENCOUNTER — Ambulatory Visit: Payer: HMO | Attending: Physician Assistant

## 2022-04-02 DIAGNOSIS — I471 Supraventricular tachycardia, unspecified: Secondary | ICD-10-CM | POA: Diagnosis not present

## 2022-04-02 DIAGNOSIS — I2510 Atherosclerotic heart disease of native coronary artery without angina pectoris: Secondary | ICD-10-CM

## 2022-04-02 DIAGNOSIS — E785 Hyperlipidemia, unspecified: Secondary | ICD-10-CM

## 2022-04-02 DIAGNOSIS — I10 Essential (primary) hypertension: Secondary | ICD-10-CM

## 2022-04-02 DIAGNOSIS — I4729 Other ventricular tachycardia: Secondary | ICD-10-CM | POA: Diagnosis not present

## 2022-04-02 DIAGNOSIS — I77819 Aortic ectasia, unspecified site: Secondary | ICD-10-CM | POA: Diagnosis not present

## 2022-04-02 LAB — HEPATIC FUNCTION PANEL
ALT: 27 IU/L (ref 0–44)
AST: 25 IU/L (ref 0–40)
Albumin: 3.6 g/dL — ABNORMAL LOW (ref 3.8–4.8)
Alkaline Phosphatase: 92 IU/L (ref 44–121)
Bilirubin Total: 0.6 mg/dL (ref 0.0–1.2)
Bilirubin, Direct: 0.23 mg/dL (ref 0.00–0.40)
Total Protein: 5.9 g/dL — ABNORMAL LOW (ref 6.0–8.5)

## 2022-04-02 LAB — LIPID PANEL
Chol/HDL Ratio: 1.7 ratio (ref 0.0–5.0)
Cholesterol, Total: 126 mg/dL (ref 100–199)
HDL: 73 mg/dL (ref >39)
LDL Chol Calc (NIH): 43 mg/dL (ref 0–99)
Triglycerides: 41 mg/dL (ref 0–149)
VLDL Cholesterol Cal: 10 mg/dL (ref 5–40)

## 2022-04-08 ENCOUNTER — Ambulatory Visit (INDEPENDENT_AMBULATORY_CARE_PROVIDER_SITE_OTHER): Payer: HMO | Admitting: Family Medicine

## 2022-04-08 ENCOUNTER — Encounter: Payer: Self-pay | Admitting: Family Medicine

## 2022-04-08 VITALS — BP 116/63 | HR 59 | Temp 98.2°F | Ht 70.0 in | Wt 159.0 lb

## 2022-04-08 DIAGNOSIS — K529 Noninfective gastroenteritis and colitis, unspecified: Secondary | ICD-10-CM | POA: Diagnosis not present

## 2022-04-08 DIAGNOSIS — I4719 Other supraventricular tachycardia: Secondary | ICD-10-CM | POA: Diagnosis not present

## 2022-04-08 DIAGNOSIS — R519 Headache, unspecified: Secondary | ICD-10-CM

## 2022-04-08 DIAGNOSIS — I10 Essential (primary) hypertension: Secondary | ICD-10-CM | POA: Diagnosis not present

## 2022-04-08 DIAGNOSIS — N200 Calculus of kidney: Secondary | ICD-10-CM | POA: Diagnosis not present

## 2022-04-08 LAB — URINALYSIS, ROUTINE W REFLEX MICROSCOPIC
Bilirubin Urine: NEGATIVE
Ketones, ur: NEGATIVE
Leukocytes,Ua: NEGATIVE
Nitrite: NEGATIVE
Specific Gravity, Urine: 1.02 (ref 1.000–1.030)
Total Protein, Urine: 30 — AB
Urine Glucose: NEGATIVE
Urobilinogen, UA: 1 (ref 0.0–1.0)
pH: 6.5 (ref 5.0–8.0)

## 2022-04-08 LAB — POC COVID19 BINAXNOW: SARS Coronavirus 2 Ag: NEGATIVE

## 2022-04-08 MED ORDER — ONDANSETRON HCL 4 MG PO TABS: 4.0000 mg | ORAL_TABLET | Freq: Three times a day (TID) | ORAL | 0 refills | Status: DC | PRN

## 2022-04-08 NOTE — Assessment & Plan Note (Signed)
Blood pressure at goal on amlodipine 5 mg daily and metoprolol tartrate 12.5 mg twice daily.

## 2022-04-08 NOTE — Patient Instructions (Signed)
It was very nice to see you today!  I think you probably have a stomach bug.  This should be getting better over the next few days.  Use the Zofran as needed.  Will check a urine sample today.  Please make sure that you are getting plenty of fluids.  Let me know if your symptoms or not improving.  We will see you back later this year for your annual physical.  Please come back sooner if needed.  Take care, Dr Jerline Pain  PLEASE NOTE:  If you had any lab tests, please let us know if you have not heard back within a few days. You may see your results on mychart before we have a chance to review them but we will give you a call once they are reviewed by [place].   If we ordered any referrals today, please let us know if you have not heard from their office within the next week.   If you had any urgent prescriptions sent in today, please check with the pharmacy within an hour of our visit to make sure the prescription was transmitted appropriately.   Please try these tips to maintain a healthy lifestyle:  Eat at least 3 REAL meals and 1-2 snacks per day.  Aim for no more than 5 hours between eating.  If you eat breakfast, please do so within one hour of getting up.   Each meal should contain half fruits/vegetables, one quarter protein, and one quarter carbs (no bigger than a computer mouse)  Cut down on sweet beverages. This includes juice, soda, and sweet tea.   Drink at least 1 glass of water with each meal and aim for at least 8 glasses per day  Exercise at least 150 minutes every week.

## 2022-04-08 NOTE — Assessment & Plan Note (Signed)
Follows with cardiology and regular rate and rhythm today.  On Metroprolol tartrate 12.5 mg twice daily.

## 2022-04-08 NOTE — Progress Notes (Signed)
   Jeff Graves is a 77 y.o. male who presents today for an office visit.  Assessment/Plan:  New/Acute Problems: Gastroenteritis  No red flags.  It is reassuring symptoms seem to be improving.  Reassuring vital signs today.  Will start Zofran.  Encouraged hydration.  He can use Imodium as needed for diarrhea as well.  Discussed typical course of illness.  He will let me know if symptoms do not continue to improve over the next few days.  Back pain Likely related to above gastroenteritis however he is concerned about possible nephrolithiasis acute infection.  No signs of systemic illness today.  Will check labs per patient request.  Chronic Problems Addressed Today: Essential hypertension Blood pressure at goal on amlodipine 5 mg daily and metoprolol tartrate 12.5 mg twice daily.  Atrial tachycardia Follows with cardiology and regular rate and rhythm today.  On Metroprolol tartrate 12.5 mg twice daily.     Subjective:  HPI:  See A/p for status of chronic conditions.  HE is here today with nausea. No vomiting. Had sinusitis about 2 weeks ago and was treated with augmentin. This has mostly improved but still has some congestion.  Appetite has been normal. Symptoms to be improving slightly.   He did have some back pain couple days ago and is worried about a kidney stone or kidney infection.  Not currently having any symptoms.  No fevers.       Objective:  Physical Exam: BP 116/63   Pulse (!) 59   Temp 98.2 F (36.8 C) (Temporal)   Ht 5' 10"$  (1.778 m)   Wt 159 lb (72.1 kg)   SpO2 98%   BMI 22.81 kg/m   Gen: No acute distress, resting comfortably CV: Regular rate and rhythm with no murmurs appreciated Pulm: Normal work of breathing, clear to auscultation bilaterally with no crackles, wheezes, or rhonchi MSK: No CVA tenderness. Neuro: CN2-12 intact.  Strength 5 out of 5 in upper and lower extremities.  Sensation light touch intact throughout.  Psych: Normal affect and  thought content      Chinmayi Rumer M. Jerline Pain, MD 04/08/2022 12:29 PM

## 2022-04-09 LAB — URINE CULTURE
MICRO NUMBER:: 14614216
Result:: NO GROWTH
SPECIMEN QUALITY:: ADEQUATE

## 2022-04-10 ENCOUNTER — Other Ambulatory Visit (INDEPENDENT_AMBULATORY_CARE_PROVIDER_SITE_OTHER): Payer: HMO

## 2022-04-10 ENCOUNTER — Other Ambulatory Visit: Payer: Self-pay | Admitting: *Deleted

## 2022-04-10 DIAGNOSIS — R319 Hematuria, unspecified: Secondary | ICD-10-CM | POA: Diagnosis not present

## 2022-04-10 NOTE — Progress Notes (Signed)
Please inform patient of the following:  His blood work is negative but he did have some blood in his urine.  This could indicate kidney stone.  Please have him come back to recheck send out urinalysis next week.   Patient let us know if his pain is not improving.

## 2022-04-11 LAB — URINALYSIS, ROUTINE W REFLEX MICROSCOPIC
Bilirubin Urine: NEGATIVE
Hgb urine dipstick: NEGATIVE
Leukocytes,Ua: NEGATIVE
Nitrite: NEGATIVE
Specific Gravity, Urine: >=1.03 — AB (ref 1.000–1.030)
Total Protein, Urine: 30 — AB
Urine Glucose: NEGATIVE
Urobilinogen, UA: 1 (ref 0.0–1.0)
pH: 6 (ref 5.0–8.0)

## 2022-04-12 ENCOUNTER — Other Ambulatory Visit: Payer: Self-pay | Admitting: *Deleted

## 2022-04-12 DIAGNOSIS — R319 Hematuria, unspecified: Secondary | ICD-10-CM

## 2022-04-12 NOTE — Progress Notes (Signed)
Please inform patient of the following:  He still has trace blood in his urine but this is better than last time.  He also had some crystals in his urine and likely had a kidney stone.  He needs to come back NEXT WEEK to make sure that the blood in his urine has resolved. Please place order for SEND OUT urinalysis.  Patient will this now his symptoms are improving.

## 2022-05-21 ENCOUNTER — Other Ambulatory Visit (INDEPENDENT_AMBULATORY_CARE_PROVIDER_SITE_OTHER): Payer: HMO

## 2022-05-21 DIAGNOSIS — R319 Hematuria, unspecified: Secondary | ICD-10-CM | POA: Diagnosis not present

## 2022-05-21 LAB — URINALYSIS, ROUTINE W REFLEX MICROSCOPIC
Bilirubin Urine: NEGATIVE
Hgb urine dipstick: NEGATIVE
Ketones, ur: NEGATIVE
Leukocytes,Ua: NEGATIVE
Nitrite: NEGATIVE
RBC / HPF: NONE SEEN (ref 0–?)
Specific Gravity, Urine: 1.01 (ref 1.000–1.030)
Total Protein, Urine: NEGATIVE
Urine Glucose: NEGATIVE
Urobilinogen, UA: 0.2 (ref 0.0–1.0)
pH: 6.5 (ref 5.0–8.0)

## 2022-05-22 NOTE — Progress Notes (Signed)
Please inform patient of the following:  His blood in his urine has resolved.  He probably had a kidney stone in the past.  Do not need to do any further workup at this time.  He should let us know if he has any change in symptoms.

## 2022-05-24 ENCOUNTER — Other Ambulatory Visit: Payer: Self-pay | Admitting: Cardiovascular Disease

## 2022-06-18 DIAGNOSIS — R42 Dizziness and giddiness: Secondary | ICD-10-CM | POA: Insufficient documentation

## 2022-06-21 ENCOUNTER — Ambulatory Visit: Payer: HMO | Attending: Internal Medicine | Admitting: Internal Medicine

## 2022-06-21 ENCOUNTER — Encounter: Payer: Self-pay | Admitting: Internal Medicine

## 2022-06-21 ENCOUNTER — Telehealth (HOSPITAL_COMMUNITY): Payer: Self-pay | Admitting: *Deleted

## 2022-06-21 VITALS — BP 100/58 | HR 48 | Ht 70.0 in | Wt 156.4 lb

## 2022-06-21 DIAGNOSIS — R0609 Other forms of dyspnea: Secondary | ICD-10-CM | POA: Diagnosis not present

## 2022-06-21 DIAGNOSIS — I4719 Other supraventricular tachycardia: Secondary | ICD-10-CM

## 2022-06-21 DIAGNOSIS — R001 Bradycardia, unspecified: Secondary | ICD-10-CM

## 2022-06-21 DIAGNOSIS — R42 Dizziness and giddiness: Secondary | ICD-10-CM | POA: Diagnosis not present

## 2022-06-21 DIAGNOSIS — D649 Anemia, unspecified: Secondary | ICD-10-CM

## 2022-06-21 LAB — CBC
Hematocrit: 37.8 % (ref 37.5–51.0)
Hemoglobin: 12.9 g/dL — ABNORMAL LOW (ref 13.0–17.7)
MCH: 31.4 pg (ref 26.6–33.0)
MCHC: 34.1 g/dL (ref 31.5–35.7)
MCV: 92 fL (ref 79–97)
Platelets: 263 10*3/uL (ref 150–450)
RBC: 4.11 x10E6/uL — ABNORMAL LOW (ref 4.14–5.80)
RDW: 14.1 % (ref 11.6–15.4)
WBC: 6.1 10*3/uL (ref 3.4–10.8)

## 2022-06-21 MED ORDER — AMLODIPINE BESYLATE 5 MG PO TABS: 5.0000 mg | ORAL_TABLET | Freq: Every day | ORAL | 3 refills | Status: DC

## 2022-06-21 NOTE — Patient Instructions (Signed)
Medication Instructions:   Your physician has recommended you make the following change in your medication:   Decrease Amlodipine 5mg  - 1 tablet by mouth daily.  *If you need a refill on your cardiac medications before your next appointment, please call your pharmacy*   Lab Work: CBC today  If you have labs (blood work) drawn today and your tests are completely normal, you will receive your results only by: MyChart Message (if you have MyChart) OR A paper copy in the mail If you have any lab test that is abnormal or we need to change your treatment, we will call you to review the results.   Testing/Procedures: Exercise Myoview to be schedule   Follow-Up: At [facility], you and your health needs are our priority.  As part of our continuing mission to provide you with exceptional heart care, we have created designated Provider Care Teams.  These Care Teams include your primary Cardiologist (physician) and Advanced Practice Providers (APPs -  Physician Assistants and Nurse Practitioners) who all work together to provide you with the care you need, when you need it.  We recommend signing up for the patient portal called "MyChart".  Sign up information is provided on this After Visit Summary.  MyChart is used to connect with patients for Virtual Visits (Telemedicine).  Patients are able to view lab/test results, encounter notes, upcoming appointments, etc.  Non-urgent messages can be sent to your provider as well.   To learn more about what you can do with MyChart, go to [email or web address].    Your next appointment:   12 months with Dr Graciela Husbands

## 2022-06-21 NOTE — Progress Notes (Signed)
Patient Care Team: Ardith Dark, MD as PCP - General (Family Medicine) Kathleene Hazel, MD as PCP - Cardiology (Cardiology) Duke Salvia, MD as PCP - Electrophysiology (Cardiology) Fredrich Birks, OD as Referring Physician (Optometry)   HPI  Jeff Graves is a 77 y.o. male Seen in followup for atrial tachycardia for which he previously took flecainide    Coronary artery disease and underwent bypass surgery 10/20.   The patient denies chest pain, nocturnal dyspnea, orthopnea or peripheral edema.  There have been no palpitations, lightheadedness or syncope.  Complains of some dyspnea on exertion.  Seemingly getting worse.  No associated chest discomfort, but he reminds me that he had no chest discomfort prior to his bypass.  No changes in stool color or odor.  Asks about decreasing his atorvastatin.[name]    DATE TEST EF   10/22 LHC    % 3V CAD  4/23 Echo  50-55%           Date Cr K Hgb LDL  3/19 1.28 4.3 13.7    1/23 1.22 4.3 12.3    2/24    43   4/23 Event Recorder personnally reviewed PVCs-occasional 1.5%, PACs 2.5% Short runs of atrial tachycardia 4 beats--10 seconds with rates ranging 120-190    Past Medical History:  Diagnosis Date   Allergy    seasonal   Atrial tachycardia 06/04/2012   12/13-QRS duration-102 ms 6/14-QRS duration-98 ms    CAD (coronary artery disease)    a. s/p CABG 11/2018   CKD (chronic kidney disease), stage II    DDD (degenerative disc disease)    Diverticulosis of colon    Hemorrhoids    Hypercholesterolemia    Hypertension    NSVT (nonsustained ventricular tachycardia) (HCC)    PAC (premature atrial contraction)    Pneumothorax    Postoperative atrial fibrillation (HCC)    Renal calculus    RENAL CALCULUS, HX OF 12/26/2006   Qualifier: Diagnosis of  By: Jerolyn Shin     S/P rotator cuff surgery 04/15/2014   Shoulder pain    Sinus bradycardia     Past Surgical History:  Procedure Laterality Date   COLONOSCOPY      CORONARY ARTERY BYPASS GRAFT N/A 11/23/2018   Procedure: CORONARY ARTERY BYPASS GRAFTING (CABG), ON PUMP, TIMES FOUR, LIMA to LAD, SEQ SVG to PDA, PLVB, SVG to CIRCUMFLEX, USING LEFT INTERNAL MAMMARY ARTERY AND RIGHT GREATER SAPHENOUS VEIN HARVESTED ENDOSCOPICALLY AND LEFT GREATER SAPHENOUS VEIN;  Surgeon: Delight Ovens, MD;  Location: [facility] OR;  Service: Open Heart Surgery;  Laterality: N/A;   LEFT HEART CATH AND CORONARY ANGIOGRAPHY N/A 11/18/2018   Procedure: LEFT HEART CATH AND CORONARY ANGIOGRAPHY;  Surgeon: Kathleene Hazel, MD;  Location: [facility] INVASIVE CV LAB;  Service: Cardiovascular;  Laterality: N/A;   None     SHOULDER SURGERY Left 03/29/2014   TEE WITHOUT CARDIOVERSION N/A 11/23/2018   Procedure: TRANSESOPHAGEAL ECHOCARDIOGRAM (TEE);  Surgeon: Delight Ovens, MD;  Location: [facility] OR;  Service: Open Heart Surgery;  Laterality: N/A;    Current Outpatient Medications  Medication Sig Dispense Refill   amLODipine (NORVASC) 5 MG tablet TAKE 1 AND 1/2 TABLET BY MOUTH DAILY 135 tablet 3   aspirin EC 81 MG tablet Take 81 mg by mouth daily. Swallow whole.     atorvastatin (LIPITOR) 80 MG tablet TAKE ONE TABLET BY MOUTH DAILY 90 tablet 3   Cholecalciferol (VITAMIN D) 2000 units CAPS Take 2,000 Units by mouth daily.  Cyanocobalamin (VITAMIN B-12) 5000 MCG LOZG Take 5,000 mcg by mouth daily.      Influenza vac split quadrivalent PF (FLUZONE HIGH-DOSE) 0.5 ML injection      loratadine (CLARITIN) 10 MG tablet Take 10 mg by mouth daily as needed for allergies.      metoprolol tartrate (LOPRESSOR) 25 MG tablet Take 0.5 tablets (12.5 mg total) by mouth 2 (two) times daily. 90 tablet 3   Multiple Vitamins-Minerals (MULTIVITAMIN WITH MINERALS) tablet Take 1 tablet by mouth daily.     No current facility-administered medications for this visit.    No Known Allergies  Review of Systems negative except from HPI and PMH  Physical Exam BP (!) 100/58   Pulse (!) 48   Ht 5\' 10"  (1.778 m)    Wt 156 lb 6.4 oz (70.9 kg)   SpO2 99%   BMI 22.44 kg/m  Well developed and nourished in no acute distress HENT normal Neck supple with JVP-  flat   Clear Regular rate and rhythm, no murmurs or gallops Abd-soft with active BS No Clubbing cyanosis edema Skin-warm and dry A & Oriented  Grossly normal sensory and motor function  ECG sinus at 48 Intervals 19/09/43  Otherwise normal  Assessment and  Plan   Dyspnea  Atrial tachycardia-   Lightheadedness likely neurally mediated   Sinus bradycardia  Ischemic heart disease with prior CABG  Hypertension  Dyspnea is concerning.  It was his manifestation of ischemia.  He reports his heart rate with vigorous activity is in the 80s and 90s.  He may or may not have chronotropic incompetence.  To address both of these questions, we will undertake GXT Myoview scanning  Blood pressure is well-controlled.  Will decrease his amlodipine from 7-1/2 to 5 mg daily.  No palpitations.  Continue with low-dose metoprolol.  He is taking atorvastatin 80, he wonders whether he could reduce the dose.  With his LDLs in the mid 40s, I have suggested we decrease it to 40 and see how he does.  He wonders whether it is contributing to some of his aching.

## 2022-06-21 NOTE — Telephone Encounter (Signed)
Per DPR left detailed instructions for MPI study scheduled on 06/25/22.

## 2022-06-25 ENCOUNTER — Ambulatory Visit (HOSPITAL_COMMUNITY): Payer: HMO | Attending: Cardiology

## 2022-06-25 DIAGNOSIS — R0609 Other forms of dyspnea: Secondary | ICD-10-CM | POA: Insufficient documentation

## 2022-06-25 LAB — MYOCARDIAL PERFUSION IMAGING
Estimated workload: 10.1
Exercise duration (min): 10 min
Exercise duration (sec): 30 s
LV dias vol: 107 mL (ref 62–150)
LV sys vol: 48 mL
MPHR: 145 {beats}/min
Nuc Stress EF: 55 %
Peak HR: 133 {beats}/min
Percent HR: 91 %
Rest HR: 59 {beats}/min
Rest Nuclear Isotope Dose: 10.4 mCi
SDS: 0
SRS: 0
SSS: 0
ST Depression (mm): 0 mm
Stress Nuclear Isotope Dose: 32.5 mCi
TID: 0.99

## 2022-06-25 MED ORDER — TECHNETIUM TC 99M TETROFOSMIN IV KIT
32.5000 | PACK | Freq: Once | INTRAVENOUS | Status: AC | PRN
  Administered 2022-06-25: 32.5 via INTRAVENOUS

## 2022-06-25 MED ORDER — TECHNETIUM TC 99M TETROFOSMIN IV KIT
10.4000 | PACK | Freq: Once | INTRAVENOUS | Status: AC | PRN
  Administered 2022-06-25: 10.4 via INTRAVENOUS

## 2022-07-15 ENCOUNTER — Encounter (HOSPITAL_BASED_OUTPATIENT_CLINIC_OR_DEPARTMENT_OTHER): Payer: Self-pay

## 2022-07-15 ENCOUNTER — Other Ambulatory Visit: Payer: Self-pay

## 2022-07-15 ENCOUNTER — Emergency Department (HOSPITAL_BASED_OUTPATIENT_CLINIC_OR_DEPARTMENT_OTHER): Payer: HMO

## 2022-07-15 ENCOUNTER — Telehealth: Payer: Self-pay | Admitting: Family Medicine

## 2022-07-15 ENCOUNTER — Emergency Department (HOSPITAL_BASED_OUTPATIENT_CLINIC_OR_DEPARTMENT_OTHER)
Admission: EM | Admit: 2022-07-15 | Discharge: 2022-07-15 | Disposition: A | Discharge status: Home / Self Care | Payer: HMO | Attending: Emergency Medicine | Admitting: Emergency Medicine

## 2022-07-15 DIAGNOSIS — I129 Hypertensive chronic kidney disease with stage 1 through stage 4 chronic kidney disease, or unspecified chronic kidney disease: Secondary | ICD-10-CM | POA: Diagnosis not present

## 2022-07-15 DIAGNOSIS — Z7982 Long term (current) use of aspirin: Secondary | ICD-10-CM | POA: Diagnosis not present

## 2022-07-15 DIAGNOSIS — N189 Chronic kidney disease, unspecified: Secondary | ICD-10-CM | POA: Diagnosis not present

## 2022-07-15 DIAGNOSIS — Z79899 Other long term (current) drug therapy: Secondary | ICD-10-CM | POA: Diagnosis not present

## 2022-07-15 DIAGNOSIS — R310 Gross hematuria: Secondary | ICD-10-CM | POA: Diagnosis not present

## 2022-07-15 DIAGNOSIS — I2510 Atherosclerotic heart disease of native coronary artery without angina pectoris: Secondary | ICD-10-CM | POA: Diagnosis not present

## 2022-07-15 DIAGNOSIS — N202 Calculus of kidney with calculus of ureter: Secondary | ICD-10-CM | POA: Diagnosis not present

## 2022-07-15 DIAGNOSIS — N200 Calculus of kidney: Secondary | ICD-10-CM | POA: Diagnosis not present

## 2022-07-15 DIAGNOSIS — N201 Calculus of ureter: Secondary | ICD-10-CM

## 2022-07-15 DIAGNOSIS — R1084 Generalized abdominal pain: Secondary | ICD-10-CM | POA: Diagnosis present

## 2022-07-15 DIAGNOSIS — R944 Abnormal results of kidney function studies: Secondary | ICD-10-CM | POA: Diagnosis not present

## 2022-07-15 LAB — URINALYSIS, ROUTINE W REFLEX MICROSCOPIC
Bacteria, UA: NONE SEEN
Bilirubin Urine: NEGATIVE
Glucose, UA: NEGATIVE mg/dL
Ketones, ur: NEGATIVE mg/dL
Nitrite: NEGATIVE
Protein, ur: 30 mg/dL — AB
RBC / HPF: >50 RBC/hpf (ref 0–5)
Specific Gravity, Urine: 1.025 (ref 1.005–1.030)
pH: 5.5 (ref 5.0–8.0)

## 2022-07-15 LAB — CBC
HCT: 36.5 % — ABNORMAL LOW (ref 39.0–52.0)
Hemoglobin: 12.6 g/dL — ABNORMAL LOW (ref 13.0–17.0)
MCH: 32.1 pg (ref 26.0–34.0)
MCHC: 34.5 g/dL (ref 30.0–36.0)
MCV: 92.9 fL (ref 80.0–100.0)
Platelets: 262 10*3/uL (ref 150–400)
RBC: 3.93 MIL/uL — ABNORMAL LOW (ref 4.22–5.81)
RDW: 13.2 % (ref 11.5–15.5)
WBC: 6.9 10*3/uL (ref 4.0–10.5)
nRBC: 0 % (ref 0.0–0.2)

## 2022-07-15 LAB — BASIC METABOLIC PANEL
Anion gap: 9 (ref 5–15)
BUN: 24 mg/dL — ABNORMAL HIGH (ref 8–23)
CO2: 24 mmol/L (ref 22–32)
Calcium: 9.1 mg/dL (ref 8.9–10.3)
Chloride: 107 mmol/L (ref 98–111)
Creatinine, Ser: 1.51 mg/dL — ABNORMAL HIGH (ref 0.61–1.24)
GFR, Estimated: 48 mL/min — ABNORMAL LOW (ref >60)
Glucose, Bld: 108 mg/dL — ABNORMAL HIGH (ref 70–99)
Potassium: 4.1 mmol/L (ref 3.5–5.1)
Sodium: 140 mmol/L (ref 135–145)

## 2022-07-15 MED ORDER — FENTANYL CITRATE PF 50 MCG/ML IJ SOSY
50.0000 ug | PREFILLED_SYRINGE | Freq: Once | INTRAMUSCULAR | Status: DC
  Filled 2022-07-15: qty 1

## 2022-07-15 MED ORDER — TAMSULOSIN HCL 0.4 MG PO CAPS
0.4000 mg | ORAL_CAPSULE | Freq: Once | ORAL | Status: AC
  Administered 2022-07-15: 0.4 mg via ORAL
  Filled 2022-07-15: qty 1

## 2022-07-15 MED ORDER — LACTATED RINGERS IV BOLUS
1000.0000 mL | Freq: Once | INTRAVENOUS | Status: AC
  Administered 2022-07-15: 1000 mL via INTRAVENOUS

## 2022-07-15 MED ORDER — TAMSULOSIN HCL 0.4 MG PO CAPS: 0.4000 mg | ORAL_CAPSULE | Freq: Every day | ORAL | 0 refills | Status: AC

## 2022-07-15 NOTE — ED Notes (Signed)
Patient transported to CT 

## 2022-07-15 NOTE — Telephone Encounter (Signed)
I have spoken with patient.  Patient is going to go to [facility] to be evaluated this evening.

## 2022-07-15 NOTE — Telephone Encounter (Signed)
FYI: This call has been transferred to Access Nurse. Once the result note has been entered staff can address the message at that time.  Patient called in with the following symptoms:  Red Word: blood in urine and lower back pain  I was informed by triage that patient would be called back due to no nurses being available. I informed patient of this and he verbalized understanding. Stated he would look out for call.   Message is routed to Provider Pool and [facility] Triage

## 2022-07-15 NOTE — ED Triage Notes (Signed)
Patient here POV from Home.  Endorses Left Sided Flank Pain for 2-3 Days. Today the patient noted Blood in Urine prompting ED Evaluation. No Known fevers.   NAD Noted during Triage. A&Ox4. GCS 15. Ambulatory.

## 2022-07-15 NOTE — Discharge Instructions (Addendum)
You have a 9 mm stone in your ureter which will continue to pass.  We will start you on Flomax.  Please follow-up with urology in clinic, return for any severe worsening of symptoms, development of fever or chills or urinary tract infection symptoms.

## 2022-07-15 NOTE — ED Provider Notes (Signed)
[place] EMERGENCY DEPARTMENT AT [facility] Provider Note   CSN: [id number] Arrival date & time: 07/15/22  1712     History  Chief Complaint  Patient presents with   Flank Pain    Jeff Graves is a 77 y.o. male.   Flank Pain     77 year old male with medical history significant for HTN, HLD, kidney stones, degenerative disc disease, atrial fibrillation, CAD, CKD presenting to the emergency department with left-sided flank pain for the past 2 to 3 days.  The patient denies any fever or chills.  He denies any dysuria or increased urinary frequency.  He states that his left flank pain was more of a generalized ache over the past few days.  No nausea or vomiting.  He did endorse gross hematuria today which prompted his evaluation in the emergency department.  He currently is pain-free.  Home Medications Prior to Admission medications   Medication Sig Start Date End Date Taking? Authorizing Provider  tamsulosin (FLOMAX) 0.4 MG CAPS capsule Take 1 capsule (0.4 mg total) by mouth daily for 5 days. 07/15/22 07/20/22 Yes Ernie Avena, MD  amLODipine (NORVASC) 5 MG tablet Take 1 tablet (5 mg total) by mouth daily. 06/21/22   Duke Salvia, MD  aspirin EC 81 MG tablet Take 81 mg by mouth daily. Swallow whole.    [provider]  atorvastatin (LIPITOR) 80 MG tablet TAKE ONE TABLET BY MOUTH DAILY 05/24/22   Kathleene Hazel, MD  Cholecalciferol (VITAMIN D) 2000 units CAPS Take 2,000 Units by mouth daily.    [provider]  Cyanocobalamin (VITAMIN B-12) 5000 MCG LOZG Take 5,000 mcg by mouth daily.     [provider]  Influenza vac split quadrivalent PF (FLUZONE HIGH-DOSE) 0.5 ML injection     [provider]  loratadine (CLARITIN) 10 MG tablet Take 10 mg by mouth daily as needed for allergies.     [provider]  metoprolol tartrate (LOPRESSOR) 25 MG tablet Take 0.5 tablets (12.5 mg total) by mouth 2 (two) times daily. 06/11/21    Duke Salvia, MD  Multiple Vitamins-Minerals (MULTIVITAMIN WITH MINERALS) tablet Take 1 tablet by mouth daily.    [provider]      Allergies    Patient has no known allergies.    Review of Systems   Review of Systems  Genitourinary:  Positive for flank pain and hematuria.  All other systems reviewed and are negative.   Physical Exam Updated Vital Signs BP (!) 158/78 (BP Location: Right Arm)   Pulse 62   Temp 98.4 F (36.9 C) (Oral)   Resp 18   Ht 5\' 10"  (1.778 m)   Wt 68.9 kg   SpO2 100%   BMI 21.81 kg/m  Physical Exam Vitals and nursing note reviewed.  Constitutional:      General: He is not in acute distress.    Appearance: He is well-developed.  HENT:     Head: Normocephalic and atraumatic.  Eyes:     Conjunctiva/sclera: Conjunctivae normal.  Cardiovascular:     Rate and Rhythm: Normal rate and regular rhythm.  Pulmonary:     Effort: Pulmonary effort is normal. No respiratory distress.     Breath sounds: Normal breath sounds.  Abdominal:     Palpations: Abdomen is soft.     Tenderness: There is no abdominal tenderness. There is left CVA tenderness.  Musculoskeletal:        General: No swelling.     Cervical  back: Neck supple.  Skin:    General: Skin is warm and dry.     Capillary Refill: Capillary refill takes less than 2 seconds.  Neurological:     Mental Status: He is alert.  Psychiatric:        Mood and Affect: Mood normal.     ED Results / Procedures / Treatments   Labs (all labs ordered are listed, but only abnormal results are displayed) Labs Reviewed  URINALYSIS, ROUTINE W REFLEX MICROSCOPIC - Abnormal; Notable for the following components:      Result Value   Color, Urine ORANGE (*)    APPearance CLOUDY (*)    Hgb urine dipstick LARGE (*)    Protein, ur 30 (*)    Leukocytes,Ua TRACE (*)    All other components within normal limits  BASIC METABOLIC PANEL - Abnormal; Notable for the following components:   Glucose, Bld 108  (*)    BUN 24 (*)    Creatinine, Ser 1.51 (*)    GFR, Estimated 48 (*)    All other components within normal limits  CBC - Abnormal; Notable for the following components:   RBC 3.93 (*)    Hemoglobin 12.6 (*)    HCT 36.5 (*)    All other components within normal limits    EKG None  Radiology CT Renal Stone Study  Result Date: 07/15/2022 CLINICAL DATA:  Left-sided flank pain for 2-3 days with hematuria, initial encounter EXAM: CT ABDOMEN AND PELVIS WITHOUT CONTRAST TECHNIQUE: Multidetector CT imaging of the abdomen and pelvis was performed following the standard protocol without IV contrast. RADIATION DOSE REDUCTION: This exam was performed according to the departmental dose-optimization program which includes automated exposure control, adjustment of the mA and/or kV according to patient size and/or use of iterative reconstruction technique. COMPARISON:  None Available. FINDINGS: Lower chest: No acute abnormality. Hepatobiliary: No focal liver abnormality is seen. No gallstones, gallbladder wall thickening, or biliary dilatation. Pancreas: Unremarkable. No pancreatic ductal dilatation or surrounding inflammatory changes. Spleen: Normal in size without focal abnormality. Adrenals/Urinary Tract: Adrenal glands are within normal limits. Kidneys are well visualized bilaterally. Nonobstructing right renal calculi are seen. No left renal calculi are noted. There is a 9 mm stone identified in the left mid ureter best seen on image number 50 of series 2 and image number 42 of series 5. The more distal left ureter is within normal limits. Phlebolith is noted adjacent to the distal left ureter. Right collecting system and ureter are within normal limits. The bladder is partially distended. Stomach/Bowel: The appendix is well visualized and within normal limits. No obstructive or inflammatory changes of the colon are seen. Stomach and small bowel appear within normal limits. Vascular/Lymphatic: Aortic  atherosclerosis. No enlarged abdominal or pelvic lymph nodes. Reproductive: Prostate is unremarkable. Other: No abdominal wall hernia or abnormality. No abdominopelvic ascites. Musculoskeletal: Degenerative changes of lumbar spine are noted. L1 chronic compression deformity is seen. IMPRESSION: 9 mm left mid ureteral stone without significant obstructive change. Nonobstructing right renal calculi measuring up to 2 mm. No other focal abnormality is noted. Electronically Signed   By: Alcide Clever M.D.   On: 07/15/2022 19:39    Procedures Procedures    Medications Ordered in ED Medications  tamsulosin (FLOMAX) capsule 0.4 mg (has no administration in time range)  lactated ringers bolus 1,000 mL (1,000 mLs Intravenous New Bag/Given 07/15/22 1858)    ED Course/ Medical Decision Making/ A&P  Medical Decision Making Amount and/or Complexity of Data Reviewed Labs: ordered. Radiology: ordered.  Risk Prescription drug management.    77 year old male with medical history significant for HTN, HLD, kidney stones, degenerative disc disease, atrial fibrillation, CAD, CKD presenting to the emergency department with left-sided flank pain for the past 2 to 3 days.  The patient denies any fever or chills.  He denies any dysuria or increased urinary frequency.  He states that his left flank pain was more of a generalized ache over the past few days.  No nausea or vomiting.  He did endorse gross hematuria today which prompted his evaluation in the emergency department.  He currently is pain-free.  On arrival, the patient was vitally stable, sinus rhythm noted on cardiac telemetry.  Differential diagnose includes nephrolithiasis, diverticulitis, less likely bowel obstruction, less likely UTI/pyelonephritis.  IV access was obtained and the patient was administered an IV fluid bolus.  He declined pain medication as he was pain-free.  Her evaluation significant for hematuria with large  hemoglobin, negative for UTI with 0-5 WBCs, no bacteria seen, negative nitrites.  BMP with elevated serum creatinine however close to baseline for the patient's CKD.  EC without a leukocytosis, hemoglobin 12.6.  CT Stone Study: IMPRESSION:  9 mm left mid ureteral stone without significant obstructive change.    Nonobstructing right renal calculi measuring up to 2 mm.    No other focal abnormality is noted.   As patient is nonseptic, well-appearing, pain-free, without any clear AKI, feel the patient is likely stable for discharge with outpatient urology follow-up.  Spoke with on-call urology, Dr. Lafonda Mosses who was in agreement.   Final Clinical Impression(s) / ED Diagnoses Final diagnoses:  Gross hematuria  Ureterolithiasis    Rx / DC Orders ED Discharge Orders          Ordered    tamsulosin (FLOMAX) 0.4 MG CAPS capsule  Daily        07/15/22 1952              Ernie Avena, MD 07/15/22 1952

## 2022-07-15 NOTE — Telephone Encounter (Signed)
Patient Name First: Jeff Last: Graves Gender: Male DOB: [birth year] Age: 77 Y 4 M 21 D Return Phone Number: [phone] (Primary) Address: City/ State/ Zip: [place]  [address] Client [facility] - [facility], [facility] Provider Jacquiline Doe- MD Contact Type Call Who Is Calling Patient / Member / Family / Caregiver Call Type Triage / Clinical Relationship To Patient Self Return Phone Number [phone] (Primary) Chief Complaint Back Pain - General Reason for Call Symptomatic / Request for Health Information Initial Comment Caller states he is having lower back pain and blood in urine. Translation No Nurse Assessment Nurse: Cox, RN, Holly Date/Time (Eastern Time): 07/15/2022 4:03:16 PM Confirm and document reason for call. If symptomatic, describe symptoms. ---Caller states he is having lower back pain and blood in urine. Caller states that he just went to the bathroom and it looked like straight blood. Does the patient have any new or worsening symptoms? ---Yes Will a triage be completed? ---Yes Related visit to physician within the last 2 weeks? ---No Does the PT have any chronic conditions? (i.e. diabetes, asthma, this includes High risk factors for pregnancy, etc.) ---No Is this a behavioral health or substance abuse call? ---No Guidelines Guideline Title Affirmed Question Affirmed Notes Nurse Date/Time (Eastern Time) Urine - Blood In Passing pure blood or large blood clots (i.e., size > a dime) (Exception: Fleck or small strands.) Cox, RN, [facility] 07/15/2022 4:04:19 PM Disp. Time Lamount Cohen Time) Disposition Final User 07/15/2022 4:07:29 PM Go to ED Now Yes Cox, RN, [facility]   Final Disposition 07/15/2022 4:07:29 PM Go to ED Now Yes Cox, RN, Sharene Butters Disagree/Comply Disagree Caller Understands Yes PreDisposition Call Doctor Care Advice Given Per Guideline GO TO ED NOW: * Go to the ED  at ___________ Hospital. * You need to be seen in the Emergency Department. * Leave now. Drive carefully. CARE ADVICE given per Urine, Blood In (Adult) guideline. Comments User: Desma Paganini, RN Date/Time (Eastern Time): 07/15/2022 4:10:20 PM Spoke to Tammy at the Clinic and provided patient name, DOB, reason for calling and RN triage disposition recommendation. Referrals GO TO FACILITY REFUSED

## 2022-07-16 NOTE — Telephone Encounter (Signed)
See note  Patient at ED on 07/16/2022

## 2022-07-17 DIAGNOSIS — N202 Calculus of kidney with calculus of ureter: Secondary | ICD-10-CM | POA: Diagnosis not present

## 2022-07-19 ENCOUNTER — Telehealth: Payer: Self-pay | Admitting: *Deleted

## 2022-07-19 ENCOUNTER — Other Ambulatory Visit: Payer: Self-pay | Admitting: Urology

## 2022-07-19 ENCOUNTER — Telehealth: Payer: Self-pay | Admitting: Cardiovascular Disease

## 2022-07-19 NOTE — Telephone Encounter (Signed)
I s/w the pt and he has been set up for tele pre op appt 08/02/22. Med rec and consent are done.

## 2022-07-19 NOTE — Telephone Encounter (Signed)
   Name: Jeff Graves  DOB: 01/13/1946  MRN: [id number]  Primary Cardiologist: Verne Carrow, MD  Chart reviewed as part of pre-operative protocol coverage. Because of Camauri Craton Bonfiglio's past medical history and time since last visit, he will require a follow-up phone visit in order to better assess preoperative cardiovascular risk.  Pre-op covering staff: - Please schedule appointment and call patient to inform them. If patient already had an upcoming appointment within acceptable timeframe, please add "pre-op clearance" to the appointment notes so provider is aware. - Please contact requesting surgeon's office via preferred method (i.e, phone, fax) to inform them of need for appointment prior to surgery.  Aspirin can be held for 5 to 7 days prior to procedure as long as patient is asymptomatic at the time of telephone visit.  Sharlene Dory, PA-C  07/19/2022, 12:11 PM

## 2022-07-19 NOTE — Telephone Encounter (Signed)
I s/w the pt and he has been set up for tele pre op appt 08/02/22. Med rec and consent are done.     Patient Consent for Virtual Visit        Jeff Graves has provided verbal consent on 07/19/2022 for a virtual visit (video or telephone).   CONSENT FOR VIRTUAL VISIT FOR:  [facility] Graves  By participating in this virtual visit I agree to the following:  I hereby voluntarily request, consent and authorize [facility] and its employed or contracted physicians, physician assistants, nurse practitioners or other licensed health care professionals (the Practitioner), to provide me with telemedicine health care services (the "Services") as deemed necessary by the treating Practitioner. I acknowledge and consent to receive the Services by the Practitioner via telemedicine. I understand that the telemedicine visit will involve communicating with the Practitioner through live audiovisual communication technology and the disclosure of certain medical information by electronic transmission. I acknowledge that I have been given the opportunity to request an in-person assessment or other available alternative prior to the telemedicine visit and am voluntarily participating in the telemedicine visit.  I understand that I have the right to withhold or withdraw my consent to the use of telemedicine in the course of my care at any time, without affecting my right to future care or treatment, and that the Practitioner or I may terminate the telemedicine visit at any time. I understand that I have the right to inspect all information obtained and/or recorded in the course of the telemedicine visit and may receive copies of available information for a reasonable fee.  I understand that some of the potential risks of receiving the Services via telemedicine include:  Delay or interruption in medical evaluation due to technological equipment failure or disruption; Information transmitted may not be sufficient  (e.g. poor resolution of images) to allow for appropriate medical decision making by the Practitioner; and/or  In rare instances, security protocols could fail, causing a breach of personal health information.  Furthermore, I acknowledge that it is my responsibility to provide information about my medical history, conditions and care that is complete and accurate to the best of my ability. I acknowledge that Practitioner's advice, recommendations, and/or decision may be based on factors not within their control, such as incomplete or inaccurate data provided by me or distortions of diagnostic images or specimens that may result from electronic transmissions. I understand that the practice of medicine is not an exact science and that Practitioner makes no warranties or guarantees regarding treatment outcomes. I acknowledge that a copy of this consent can be made available to me via my patient portal [facility] MyChart), or I can request a printed copy by calling the office of [facility].    I understand that my insurance will be billed for this visit.   I have read or had this consent read to me. I understand the contents of this consent, which adequately explains the benefits and risks of the Services being provided via telemedicine.  I have been provided ample opportunity to ask questions regarding this consent and the Services and have had my questions answered to my satisfaction. I give my informed consent for the services to be provided through the use of telemedicine in my medical care

## 2022-07-19 NOTE — Telephone Encounter (Signed)
   Pre-operative Risk Assessment    Patient Name: Jeff Graves  DOB: [birth year] MRN: [id number]      Request for Surgical Clearance    Procedure:   Cystoscopy left retrograde pyelogram, left ureteroscopy, laser lithotripsy, left ureteral stent   Date of Surgery:  Clearance 08/22/22                                 Surgeon:  Dr. Everardo All Group or Practice Name:  [facility] Phone number:  [phone] Fax number:  [phone]   Type of Clearance Requested:   - Medical  - Pharmacy:  Hold Aspirin     Type of Anesthesia:  General    Additional requests/questions:    SignedForest Gleason   07/19/2022, 11:54 AM

## 2022-07-25 ENCOUNTER — Telehealth: Payer: Self-pay

## 2022-07-25 NOTE — Telephone Encounter (Signed)
Spoke with pt and advised of test results per Dr Graciela Husbands as below.  Pt verbalizes understanding and thanked Charity fundraiser for the phone call.  Pt reports at his last OV his Amlodipine was decreased due to BP.  Pt states he has been monitoring BP at home and systolic typically runs in the 80's-90/40's-50's before his morning medications.  HR typically in the 40's.  Pt reports many mornings he feels as if he he is going to pass out in the shower.  Pt has continued to take Amlodipine as prescribed until this morning.  Pt advised to continue to hold Amlodipine until further advisement from Dr Graciela Husbands.  Reviewed ED precautions.  Pt verbalizes understanding and agrees with current plan.

## 2022-07-25 NOTE — Telephone Encounter (Signed)
-----   Message from Duke Salvia, MD sent at 07/25/2022 11:20 AM EDT ----- Please Inform Patient that stress test showed no evidence of ischemia, stable normal heart muscle function   Thanks

## 2022-07-26 NOTE — Telephone Encounter (Signed)
Agree with stopping amlodipine,  full benefit/impact of this may take a week to be manifest .Thanks SK

## 2022-08-01 NOTE — Progress Notes (Signed)
Virtual Visit via Telephone Note   Because of Jeff Graves's co-morbid illnesses, he is at least at moderate risk for complications without adequate follow up.  This format is felt to be most appropriate for this patient at this time.  The patient did not have access to video technology/had technical difficulties with video requiring transitioning to audio format only (telephone).  All issues noted in this document were discussed and addressed.  No physical exam could be performed with this format.  Please refer to the patient's chart for his consent to telehealth for [facility] Graves.  Evaluation Performed:  Preoperative cardiovascular risk assessment _____________   Date:  08/01/2022   Patient ID:  Jeff [facility], DOB Oct 18, 1945, MRN [id number] Patient Location:  Home Provider location:   Office  Primary Care Provider:  Ardith Dark, MD Primary Cardiologist:  Verne Carrow, MD  Chief Complaint / Patient Profile   77 y.o. y/o male with a h/o essential hypertension, atrial tachycardia, coronary artery disease CKD stage III who is pending cystoscopy left retrograde pyelogram, left ureteroscopy, laser lithotripsy, left ureteral stent and presents today for telephonic preoperative cardiovascular risk assessment.  History of Present Illness    [facility] Graves is a 77 y.o. male who presents via audio/video conferencing for a telehealth visit today.  Pt was last seen in cardiology clinic on 06/21/2022 by Dr. Graciela Husbands.  At that time Jeff Graves was doing well .  The patient is now pending procedure as outlined above. Since his last visit, he remains stable from a cardiac standpoint.  Today he denies chest pain, shortness of breath, lower extremity edema, fatigue, palpitations, melena, hematuria, hemoptysis, diaphoresis, weakness, presyncope, syncope, orthopnea, and PND.   Past Medical History    Past Medical History:  Diagnosis Date   Allergy    seasonal   Atrial  tachycardia 06/04/2012   12/13-QRS duration-102 ms 6/14-QRS duration-98 ms    CAD (coronary artery disease)    a. s/p CABG 11/2018   CKD (chronic kidney disease), stage II    DDD (degenerative disc disease)    Diverticulosis of colon    Hemorrhoids    Hypercholesterolemia    Hypertension    NSVT (nonsustained ventricular tachycardia) (HCC)    PAC (premature atrial contraction)    Pneumothorax    Postoperative atrial fibrillation (HCC)    Renal calculus    RENAL CALCULUS, HX OF 12/26/2006   Qualifier: Diagnosis of  By: Jerolyn Shin     S/P rotator cuff surgery 04/15/2014   Shoulder pain    Sinus bradycardia    Past Surgical History:  Procedure Laterality Date   COLONOSCOPY     CORONARY ARTERY BYPASS GRAFT N/A 11/23/2018   Procedure: CORONARY ARTERY BYPASS GRAFTING (CABG), ON PUMP, TIMES FOUR, LIMA to LAD, SEQ SVG to PDA, PLVB, SVG to CIRCUMFLEX, USING LEFT INTERNAL MAMMARY ARTERY AND RIGHT GREATER SAPHENOUS VEIN HARVESTED ENDOSCOPICALLY AND LEFT GREATER SAPHENOUS VEIN;  Surgeon: Delight Ovens, MD;  Location: [facility] OR;  Service: Open Heart Surgery;  Laterality: N/A;   LEFT HEART CATH AND CORONARY ANGIOGRAPHY N/A 11/18/2018   Procedure: LEFT HEART CATH AND CORONARY ANGIOGRAPHY;  Surgeon: Kathleene Hazel, MD;  Location: [facility] INVASIVE CV LAB;  Service: Cardiovascular;  Laterality: N/A;   None     SHOULDER SURGERY Left 03/29/2014   TEE WITHOUT CARDIOVERSION N/A 11/23/2018   Procedure: TRANSESOPHAGEAL ECHOCARDIOGRAM (TEE);  Surgeon: Delight Ovens, MD;  Location: [facility];  Service: Open Heart Surgery;  Laterality: N/A;    Allergies  No Known Allergies  Home Medications    Prior to Admission medications   Medication Sig Start Date End Date Taking? Authorizing Provider  amLODipine (NORVASC) 5 MG tablet Take 1 tablet (5 mg total) by mouth daily. 06/21/22   Duke Salvia, MD  aspirin EC 81 MG tablet Take 81 mg by mouth daily. Swallow whole.    [provider]   atorvastatin (LIPITOR) 80 MG tablet TAKE ONE TABLET BY MOUTH DAILY 05/24/22   Kathleene Hazel, MD  Cholecalciferol (VITAMIN D) 2000 units CAPS Take 2,000 Units by mouth daily.    [provider]  Cyanocobalamin (VITAMIN B-12) 5000 MCG LOZG Take 5,000 mcg by mouth daily.     [provider]  Influenza vac split quadrivalent PF (FLUZONE HIGH-DOSE) 0.5 ML injection     [provider]  loratadine (CLARITIN) 10 MG tablet Take 10 mg by mouth daily as needed for allergies.     [provider]  metoprolol tartrate (LOPRESSOR) 25 MG tablet Take 0.5 tablets (12.5 mg total) by mouth 2 (two) times daily. 06/11/21   Duke Salvia, MD  Multiple Vitamins-Minerals (MULTIVITAMIN WITH MINERALS) tablet Take 1 tablet by mouth daily.    [provider]    Physical Exam    Vital Signs:  Jeff Graves does not have vital signs available for review today.  Given telephonic nature of communication, physical exam is limited. AAOx3. NAD. Normal affect.  Speech and respirations are unlabored.  Accessory Clinical Findings    None  Assessment & Plan    1.  Preoperative Cardiovascular Risk Assessment: Cystoscopy left retrograde pyelogram, left ureteroscopy, laser lithotripsy, left ureteral stent placement, Dr. Laverle Patter, [facility], [phone]      Primary Cardiologist: Verne Carrow, MD  Chart reviewed as part of pre-operative protocol coverage. Given past medical history and time since last visit, based on ACC/AHA guidelines, Jeff Graves would be at acceptable risk for the planned procedure without further cardiovascular testing.   His RCRI is a class III risk, 6.6% risk of major cardiac event.  He is able to complete greater than 4 METS of physical activity.  His aspirin may be held for 5 to 7 days prior to his surgery.  Please resume as soon as hemostasis is achieved.  Patient was advised that if he develops new symptoms prior to  surgery to contact our office to arrange a follow-up appointment.  He verbalized understanding.  I will route this recommendation to the requesting party via Epic fax function and remove from pre-op pool.       Time:   Today, I have spent 5  minutes with the patient with telehealth technology discussing medical history, symptoms, and management plan.  Prior to his phone evaluation I spent greater than 10 minutes reviewing his past medical history and cardiac medications.   Ronney Asters, NP  08/01/2022, 4:38 PM

## 2022-08-02 ENCOUNTER — Ambulatory Visit: Payer: HMO | Attending: Cardiovascular Disease

## 2022-08-02 DIAGNOSIS — Z01810 Encounter for preprocedural cardiovascular examination: Secondary | ICD-10-CM

## 2022-08-07 NOTE — Telephone Encounter (Signed)
Spoke with Jeff Graves and advised of Dr Odessa Fleming recommendation to stop Amlodipine and continue to monitor BP.  Call for any further questions or concerns.  Jeff Graves verbalizes understanding and agrees with current plan.

## 2022-08-09 NOTE — Progress Notes (Signed)
COVID Vaccine Completed:  Yes  Date of COVID positive in last 90 days:  PCP - Jacquiline Doe, MD Cardiologist - Earney [place], MD Electrophysiology - Berton Mount, MD  Cardiac clearance in Epic dated 08-01-22 by Edd Fabian, NP  Chest x-ray -  EKG - 06-21-22 Epic Stress Test - 06-25-22 Epic ECHO - 05-28-21 Epic Cardiac Cath - 11-18-18 Epic Pacemaker/ICD device last checked: Spinal Cord Stimulator: Long Term Monitor - 05-30-21 Epic Bowel Prep -   Sleep Study -  CPAP -   Fasting Blood Sugar -  Checks Blood Sugar _____ times a day  Last dose of GLP1 agonist-  N/A GLP1 instructions:  N/A   Last dose of SGLT-2 inhibitors-  N/A SGLT-2 instructions: N/A   Blood Thinner Instructions:  Time Aspirin Instructions:  ASA 81.  Hold x1 week  Last Dose:  Activity level:  Can go up a flight of stairs and perform activities of daily living without stopping and without symptoms of chest pain or shortness of breath.  Able to exercise without symptoms  Unable to go up a flight of stairs without symptoms of     Anesthesia review:  CAD with hx of CABG hx of Afib, atrial tachycardia, HTN, CKD  Patient denies shortness of breath, fever, cough and chest pain at PAT appointment  Patient verbalized understanding of instructions that were given to them at the PAT appointment. Patient was also instructed that they will need to review over the PAT instructions again at home before surgery.

## 2022-08-09 NOTE — Patient Instructions (Signed)
SURGICAL WAITING ROOM VISITATION Patients having surgery or a procedure may have no more than 2 support people in the waiting area - these visitors may rotate.    Children under the age of 32 must have an adult with them who is not the patient.  If the patient needs to stay at the hospital during part of their recovery, the visitor guidelines for inpatient rooms apply. Pre-op nurse will coordinate an appropriate time for 1 support person to accompany patient in pre-op.  This support person may not rotate.    Please refer to the [facility] website for the visitor guidelines for Inpatients (after your surgery is over and you are in a regular room).       Your procedure is scheduled on: 08-22-22   Report to [facility] Main Entrance    Report to admitting at 1:00 PM   Call this number if you have problems the morning of surgery [phone]   Do not eat food or drink liquids :After Midnight.           If you have questions, please contact your surgeon's office.   FOLLOW ANY ADDITIONAL PRE OP INSTRUCTIONS YOU RECEIVED FROM YOUR SURGEON'S OFFICE!!!     Oral Hygiene is also important to reduce your risk of infection.                                    Remember - BRUSH YOUR TEETH THE MORNING OF SURGERY WITH YOUR REGULAR TOOTHPASTE   Do NOT smoke after Midnight   Take these medicines the morning of surgery with A SIP OF WATER:   Amlodipine  Claritin  Metoprolol  Bring CPAP mask and tubing day of surgery.                              You may not have any metal on your body including  jewelry, and body piercing             Do not wear  lotions, powders, cologne, or deodorant              Men may shave face and neck.   Do not bring valuables to the hospital. [facility] IS NOT RESPONSIBLE   FOR VALUABLES.   Contacts, dentures or bridgework may not be worn into surgery.   DO NOT BRING YOUR HOME MEDICATIONS TO THE HOSPITAL. PHARMACY WILL DISPENSE MEDICATIONS LISTED ON  YOUR MEDICATION LIST TO YOU DURING YOUR ADMISSION IN THE HOSPITAL!    Patients discharged on the day of surgery will not be allowed to drive home.  Someone NEEDS to stay with you for the first 24 hours after anesthesia.   Special Instructions: Bring a copy of your healthcare power of attorney and living will documents the day of surgery if you haven't scanned them before.              Please read over the following fact sheets you were given: IF YOU HAVE QUESTIONS ABOUT YOUR PRE-OP INSTRUCTIONS PLEASE CALL [phone] G[facility]  If you received a COVID test during your pre-op visit  it is requested that you wear a mask when out in public, stay away from anyone that may not be feeling well and notify your surgeon if you develop symptoms. If you test positive for Covid or have been in contact with anyone that has  tested positive in the last 10 days please notify you surgeon.  [place] - Preparing for Surgery Before surgery, you can play an important role.  Because skin is not sterile, your skin needs to be as free of germs as possible.  You can reduce the number of germs on your skin by washing with CHG (chlorahexidine gluconate) soap before surgery.  CHG is an antiseptic cleaner which kills germs and bonds with the skin to continue killing germs even after washing. Please DO NOT use if you have an allergy to CHG or antibacterial soaps.  If your skin becomes reddened/irritated stop using the CHG and inform your nurse when you arrive at Short Stay. Do not shave (including legs and underarms) for at least 48 hours prior to the first CHG shower.  You may shave your face/neck.  Please follow these instructions carefully:  1.  Shower with CHG Soap the night before surgery and the  morning of surgery.  2.  If you choose to wash your hair, wash your hair first as usual with your normal  shampoo.  3.  After you shampoo, rinse your hair and body thoroughly to remove the shampoo.                             4.   Use CHG as you would any other liquid soap.  You can apply chg directly to the skin and wash.  Gently with a scrungie or clean washcloth.  5.  Apply the CHG Soap to your body ONLY FROM THE NECK DOWN.   Do   not use on face/ open                           Wound or open sores. Avoid contact with eyes, ears mouth and   genitals (private parts).                       Wash face,  Genitals (private parts) with your normal soap.             6.  Wash thoroughly, paying special attention to the area where your    surgery  will be performed.  7.  Thoroughly rinse your body with warm water from the neck down.  8.  DO NOT shower/wash with your normal soap after using and rinsing off the CHG Soap.                9.  Pat yourself dry with a clean towel.            10.  Wear clean pajamas.            11.  Place clean sheets on your bed the night of your first shower and do not  sleep with pets. Day of Surgery : Do not apply any lotions/deodorants the morning of surgery.  Please wear clean clothes to the hospital/surgery center.  FAILURE TO FOLLOW THESE INSTRUCTIONS MAY RESULT IN THE CANCELLATION OF YOUR SURGERY  PATIENT SIGNATURE_________________________________  NURSE SIGNATURE__________________________________  ________________________________________________________________________

## 2022-08-12 ENCOUNTER — Other Ambulatory Visit: Payer: Self-pay

## 2022-08-12 ENCOUNTER — Encounter (HOSPITAL_COMMUNITY): Payer: Self-pay

## 2022-08-12 ENCOUNTER — Encounter (HOSPITAL_COMMUNITY)
Admission: RE | Admit: 2022-08-12 | Discharge: 2022-08-12 | Disposition: A | Discharge status: Home / Self Care | Payer: HMO | Source: Ambulatory Visit | Attending: Urology | Admitting: Urology

## 2022-08-12 DIAGNOSIS — Z01818 Encounter for other preprocedural examination: Secondary | ICD-10-CM | POA: Insufficient documentation

## 2022-08-12 HISTORY — DX: Personal history of urinary calculi: Z87.442

## 2022-08-12 HISTORY — DX: Unspecified atrial fibrillation: I48.91

## 2022-08-12 LAB — BASIC METABOLIC PANEL
Anion gap: 7 (ref 5–15)
BUN: 22 mg/dL (ref 8–23)
CO2: 24 mmol/L (ref 22–32)
Calcium: 9 mg/dL (ref 8.9–10.3)
Chloride: 107 mmol/L (ref 98–111)
Creatinine, Ser: 1.39 mg/dL — ABNORMAL HIGH (ref 0.61–1.24)
GFR, Estimated: 53 mL/min — ABNORMAL LOW (ref >60)
Glucose, Bld: 90 mg/dL (ref 70–99)
Potassium: 4.2 mmol/L (ref 3.5–5.1)
Sodium: 138 mmol/L (ref 135–145)

## 2022-08-13 NOTE — Progress Notes (Signed)
Anesthesia Chart Review   Case: [id number] Date/Time: 08/22/22 1500   Procedure: CYSTOSCOPY/LEFT RETROGRADE PYELOGRAM/ LEFT URETEROSCOPY/HOLMIUM LASER/LEFT URETERAL STENT PLACEMENT (Left) - 45 MINUTES NEEDED FOR CASE   Anesthesia type: General   Pre-op diagnosis: LEFT URETERAL CALCULUS   [facility] ROOM 03 / [facility]   Surgeons: Heloise Purpura, MD       DISCUSSION:77 y.o. former smoker with h/o HTN, CAD, CKD Stage III, atrial fibrillation, left ureteral calculus scheduled for above procedure 08/22/22 with Dr. Heloise Purpura.  Per cardiology preoperative evaluation 08/02/2022, "Chart reviewed as part of pre-operative protocol coverage. Given past medical history and time since last visit, based on ACC/AHA guidelines, Jeff Graves would be at acceptable risk for the planned procedure without further cardiovascular testing.    His RCRI is a class III risk, 6.6% risk of major cardiac event.  He is able to complete greater than 4 METS of physical activity.   His aspirin may be held for 5 to 7 days prior to his surgery.  Please resume as soon as hemostasis is achieved."  Low risk stress test 06/25/2022.   Anticipate pt can proceed with planned procedure barring acute status change.   VS: BP 120/82   Pulse (!) 48   Temp 36.7 C (Oral)   Resp 16   Ht 5\' 10"  (1.778 m)   Wt 69.2 kg   SpO2 97%   BMI 21.90 kg/m   PROVIDERS: Ardith Dark, MD is PCP   Primary Cardiologist:  Verne Carrow, MD  LABS: Labs reviewed: Acceptable for surgery. (all labs ordered are listed, but only abnormal results are displayed)  Labs Reviewed  BASIC METABOLIC PANEL - Abnormal; Notable for the following components:      Result Value   Creatinine, Ser 1.39 (*)    GFR, Estimated 53 (*)    All other components within normal limits     IMAGES:   EKG:   CV: Myocardial Perfusion 06/25/2022   The study is normal. The study is low risk.   Patient exercised according to the BRUCE protocol for  10:37min achieving 10.1 METs   Target HR was achieved (max HR 133bpm; 91% MPHR)   No ST deviation was noted.   LV perfusion is normal. There is no evidence of ischemia. There is no evidence of infarction.   Left ventricular function is normal. Nuclear stress EF: 55 %. The left ventricular ejection fraction is normal (55-65%). End diastolic cavity size is normal. End systolic cavity size is normal.   Prior study available for comparison from 12/13/2014. No ischemia visualized on current study.    Echo 05/28/2021 1. Left ventricular ejection fraction, by estimation, is 55 to 60%. The  left ventricle has normal function. The left ventricle has no regional  wall motion abnormalities. There is mild left ventricular hypertrophy.  Left ventricular diastolic parameters  are indeterminate.   2. Right ventricular systolic function is normal. The right ventricular  size is mildly enlarged.   3. Left atrial size was moderately dilated.   4. Right atrial size was mildly dilated.   5. The mitral valve is normal in structure. Trivial mitral valve  regurgitation. No evidence of mitral stenosis.   6. The aortic valve is tricuspid. Aortic valve regurgitation is mild. No  aortic stenosis is present.   7. Aortic dilatation noted. There is dilatation of the ascending aorta,  measuring 40 mm.  Past Medical History:  Diagnosis Date   A-fib [facility])    Episode after surgery  Allergy    seasonal   Atrial tachycardia 06/04/2012   12/13-QRS duration-102 ms 6/14-QRS duration-98 ms    CAD (coronary artery disease)    a. s/p CABG 11/2018   CKD (chronic kidney disease), stage II    DDD (degenerative disc disease)    Diverticulosis of colon    Hemorrhoids    History of kidney stones    Hypercholesterolemia    Hypertension    NSVT (nonsustained ventricular tachycardia) (HCC)    PAC (premature atrial contraction)    Pneumothorax    Postoperative atrial fibrillation (HCC)    Renal calculus    RENAL CALCULUS,  HX OF 12/26/2006   Qualifier: Diagnosis of  By: Jerolyn Shin     S/P rotator cuff surgery 04/15/2014   Shoulder pain    Sinus bradycardia     Past Surgical History:  Procedure Laterality Date   COLONOSCOPY     CORONARY ARTERY BYPASS GRAFT N/A 11/23/2018   Procedure: CORONARY ARTERY BYPASS GRAFTING (CABG), ON PUMP, TIMES FOUR, LIMA to LAD, SEQ SVG to PDA, PLVB, SVG to CIRCUMFLEX, USING LEFT INTERNAL MAMMARY ARTERY AND RIGHT GREATER SAPHENOUS VEIN HARVESTED ENDOSCOPICALLY AND LEFT GREATER SAPHENOUS VEIN;  Surgeon: Delight Ovens, MD;  Location: [facility] OR;  Service: Open Heart Surgery;  Laterality: N/A;   LEFT HEART CATH AND CORONARY ANGIOGRAPHY N/A 11/18/2018   Procedure: LEFT HEART CATH AND CORONARY ANGIOGRAPHY;  Surgeon: Kathleene Hazel, MD;  Location: [facility] INVASIVE CV LAB;  Service: Cardiovascular;  Laterality: N/A;   None     SHOULDER SURGERY Left 03/29/2014   TEE WITHOUT CARDIOVERSION N/A 11/23/2018   Procedure: TRANSESOPHAGEAL ECHOCARDIOGRAM (TEE);  Surgeon: Delight Ovens, MD;  Location: [facility] OR;  Service: Open Heart Surgery;  Laterality: N/A;    MEDICATIONS:  amLODipine (NORVASC) 5 MG tablet   aspirin EC 81 MG tablet   atorvastatin (LIPITOR) 80 MG tablet   Cholecalciferol (VITAMIN D) 2000 units CAPS   Cyanocobalamin (VITAMIN B-12) 5000 MCG LOZG   loratadine (CLARITIN) 10 MG tablet   metoprolol tartrate (LOPRESSOR) 25 MG tablet   Multiple Vitamins-Minerals (MULTIVITAMIN WITH MINERALS) tablet   No current facility-administered medications for this encounter.    Jodell Cipro Ward, PA-C [facility] Pre-Surgical Testing [phone]

## 2022-08-13 NOTE — Anesthesia Preprocedure Evaluation (Addendum)
Anesthesia Evaluation  Patient identified by MRN, date of birth, ID band Patient awake    Reviewed: Allergy & Precautions, NPO status , Patient's Chart, lab work & pertinent test results, reviewed documented beta blocker date and time   Airway Mallampati: I  TM Distance: >3 FB Neck ROM: Full    Dental no notable dental hx. (+) Teeth Intact, Dental Advisory Given   Pulmonary neg pulmonary ROS, former smoker   Pulmonary exam normal breath sounds clear to auscultation       Cardiovascular hypertension, Pt. on medications and Pt. on home beta blockers + angina  + CAD  Normal cardiovascular exam+ dysrhythmias Atrial Fibrillation  Rhythm:Regular Rate:Normal     Neuro/Psych negative neurological ROS  negative psych ROS   GI/Hepatic negative GI ROS, Neg liver ROS,,,  Endo/Other  negative endocrine ROS    Renal/GU Renal InsufficiencyRenal disease  negative genitourinary   Musculoskeletal  (+) Arthritis ,    Abdominal   Peds  Hematology negative hematology ROS (+)   Anesthesia Other Findings   Reproductive/Obstetrics                             Anesthesia Physical Anesthesia Plan  ASA: 3  Anesthesia Plan: General   Post-op Pain Management:    Induction: Intravenous  PONV Risk Score and Plan: 2 and Ondansetron, Dexamethasone and Treatment may vary due to age or medical condition  Airway Management Planned: LMA  Additional Equipment:   Intra-op Plan:   Post-operative Plan: Extubation in OR  Informed Consent: I have reviewed the patients History and Physical, chart, labs and discussed the procedure including the risks, benefits and alternatives for the proposed anesthesia with the patient or authorized representative who has indicated his/her understanding and acceptance.     Dental advisory given  Plan Discussed with: CRNA  Anesthesia Plan Comments: (See PAT note 08/12/2022)        Anesthesia Quick Evaluation

## 2022-08-18 ENCOUNTER — Other Ambulatory Visit: Payer: Self-pay | Admitting: Physician Assistant

## 2022-08-21 DIAGNOSIS — Z87891 Personal history of nicotine dependence: Secondary | ICD-10-CM | POA: Diagnosis not present

## 2022-08-21 DIAGNOSIS — I2510 Atherosclerotic heart disease of native coronary artery without angina pectoris: Secondary | ICD-10-CM | POA: Diagnosis not present

## 2022-08-21 DIAGNOSIS — N200 Calculus of kidney: Secondary | ICD-10-CM | POA: Diagnosis not present

## 2022-08-21 DIAGNOSIS — I110 Hypertensive heart disease with heart failure: Secondary | ICD-10-CM | POA: Diagnosis not present

## 2022-08-21 DIAGNOSIS — H259 Unspecified age-related cataract: Secondary | ICD-10-CM | POA: Diagnosis not present

## 2022-08-21 DIAGNOSIS — Z7982 Long term (current) use of aspirin: Secondary | ICD-10-CM | POA: Diagnosis not present

## 2022-08-21 DIAGNOSIS — E785 Hyperlipidemia, unspecified: Secondary | ICD-10-CM | POA: Diagnosis not present

## 2022-08-21 DIAGNOSIS — I509 Heart failure, unspecified: Secondary | ICD-10-CM | POA: Diagnosis not present

## 2022-08-21 DIAGNOSIS — Z951 Presence of aortocoronary bypass graft: Secondary | ICD-10-CM | POA: Diagnosis not present

## 2022-08-21 DIAGNOSIS — I10 Essential (primary) hypertension: Secondary | ICD-10-CM | POA: Diagnosis not present

## 2022-08-21 NOTE — H&P (Signed)
Office Visit Report     07/17/2022   --------------------------------------------------------------------------------   Jeff Graves  MRN: [id number]  DOB: [birth year], 77 year old Male  SSN:    PRIMARY CARE:  Jacquiline Doe, MD  PRIMARY CARE FAX:  [phone]  REFERRING:    PROVIDER:  Heloise Purpura, M.D.  LOCATION:  [facility] [phone]     --------------------------------------------------------------------------------   CC/HPI: Left ureteral calculus   Jeff Graves is a 77 year old gentleman who developed painless gross hematuria in March which resolved. He then developed the acute onset of more severe left-sided flank pain on Monday and presented to the emergency department for further evaluation. He denies fever or nausea or vomiting. He denies any hematuria. He does have a history of recurrent urolithiasis and has passed 3 stones spontaneously in the past. He has never required surgical intervention. He had a CT scan performed on Monday which confirmed a 9 mm mid left ureteral calculus and a 2 mm nonobstructing right renal calculus.     ALLERGIES: None   MEDICATIONS: Aspirin 81 mg tablet,chewable  Metoprolol Tartrate 25 mg tablet  Amlodipine Besylate 5 mg tablet  Atorvastatin Calcium 80 mg tablet  Loratadine 10 mg tablet     GU PSH: None   NON-GU PSH: Coronary Artery Bypass Grafting     GU PMH: None   NON-GU PMH: Hypercholesterolemia Hypertension    FAMILY HISTORY: 1 Daughter - Runs in Family   SOCIAL HISTORY: Marital Status: Married Preferred Language: English; Ethnicity: Not Hispanic Or Latino; Race: White Current Smoking Status: Patient does not smoke anymore.   Tobacco Use Assessment Completed: Used Tobacco in last 30 days? Has never drank.  Drinks 4+ caffeinated drinks per day.    REVIEW OF SYSTEMS:    GU Review Male:   Patient reports get up at night to urinate, stream starts and stops, trouble starting your streams, and  have to strain to urinate . Patient denies frequent urination, hard to postpone urination, burning/ pain with urination, and leakage of urine.  Gastrointestinal (Lower):   Patient denies diarrhea and constipation.  Gastrointestinal (Upper):   Patient denies nausea and vomiting.  Constitutional:   Patient denies fever, night sweats, weight loss, and fatigue.  Skin:   Patient denies skin rash/ lesion and itching.  Eyes:   Patient denies blurred vision and double vision.  Ears/ Nose/ Throat:   Patient denies sore throat and sinus problems.  Hematologic/Lymphatic:   Patient reports easy bruising. Patient denies swollen glands.  Cardiovascular:   Patient denies leg swelling and chest pains.  Respiratory:   Patient denies cough and shortness of breath.  Endocrine:   Patient denies excessive thirst.  Musculoskeletal:   Patient denies back pain and joint pain.  Neurological:   Patient denies headaches and dizziness.  Psychologic:   Patient denies depression and anxiety.   VITAL SIGNS: None   MULTI-SYSTEM PHYSICAL EXAMINATION:    Constitutional: Well-nourished. No physical deformities. Normally developed. Good grooming.  Respiratory: No labored breathing, no use of accessory muscles.   Cardiovascular: Normal temperature, normal extremity pulses, no swelling, no varicosities.  Gastrointestinal: Mild left CVA tenderness. No abdominal tenderness.     Complexity of Data:  X-Ray Review: KUB: Reviewed Films.  C.T. Abdomen/Pelvis: Reviewed Films.    Notes:                     CLINICAL DATA: Left-sided flank pain for 2-3 days with hematuria,  initial encounter  EXAM:  CT ABDOMEN AND PELVIS WITHOUT CONTRAST   TECHNIQUE:  Multidetector CT imaging of the abdomen and pelvis was performed  following the standard protocol without IV contrast.   RADIATION DOSE REDUCTION: This exam was performed according to the  departmental dose-optimization program which includes automated  exposure control,  adjustment of the mA and/or kV according to  patient size and/or use of iterative reconstruction technique.   COMPARISON: None Available.   FINDINGS:  Lower chest: No acute abnormality.   Hepatobiliary: No focal liver abnormality is seen. No gallstones,  gallbladder wall thickening, or biliary dilatation.   Pancreas: Unremarkable. No pancreatic ductal dilatation or  surrounding inflammatory changes.   Spleen: Normal in size without focal abnormality.   Adrenals/Urinary Tract: Adrenal glands are within normal limits.  Kidneys are well visualized bilaterally. Nonobstructing right renal  calculi are seen. No left renal calculi are noted. There is a 9 mm  stone identified in the left mid ureter best seen on image number 50  of series 2 and image number 42 of series 5. The more distal left  ureter is within normal limits. Phlebolith is noted adjacent to the  distal left ureter. Right collecting system and ureter are within  normal limits. The bladder is partially distended.   Stomach/Bowel: The appendix is well visualized and within normal  limits. No obstructive or inflammatory changes of the colon are  seen. Stomach and small bowel appear within normal limits.   Vascular/Lymphatic: Aortic atherosclerosis. No enlarged abdominal or  pelvic lymph nodes.   Reproductive: Prostate is unremarkable.   Other: No abdominal wall hernia or abnormality. No abdominopelvic  ascites.   Musculoskeletal: Degenerative changes of lumbar spine are noted. L1  chronic compression deformity is seen.   IMPRESSION:  9 mm left mid ureteral stone without significant obstructive change.   Nonobstructing right renal calculi measuring up to 2 mm.   No other focal abnormality is noted.    Electronically Signed  By: Alcide Clever M.D.  On: 07/15/2022 19:39   I independently reviewed his KUB x-ray. I cannot clearly identify is 9 mm mid ureteral calculus likely due to the fact that it is overlying his  pelvic bone. He has multiple pelvic phleboliths confirmed from his CT scan.   PROCEDURES:         KUB - [id number]  A single view of the abdomen is obtained.      Patient confirmed No Neulasta OnPro Device.           Urinalysis w/Scope Dipstick Dipstick Cont'd Micro  Color: Amber Bilirubin: Neg mg/dL WBC/hpf: 0 - 5/hpf  Appearance: Cloudy Ketones: Neg mg/dL RBC/hpf: >11/BJY  Specific Gravity: 1.020 Blood: 3+ ery/uL Bacteria: Rare (0-9/hpf)  pH: 6.5 Protein: 1+ mg/dL Cystals: NS (Not Seen)  Glucose: Neg mg/dL Urobilinogen: 0.2 mg/dL Casts: Hyaline    Nitrites: Neg Trichomonas: Not Present    Leukocyte Esterase: Neg leu/uL Mucous: Present      Epithelial Cells: 0 - 5/hpf      Yeast: NS (Not Seen)      Sperm: Not Present    ASSESSMENT:      ICD-10 Details  1 GU:   Renal calculus - N20.0   2   Ureteral calculus - N20.1    PLAN:            Medications New Meds: Hydrocodone-Acetaminophen 5 mg-325 mg tablet 1-2 tablet PO Q 6 H prn   #15  0 Refill(s)  Tamsulosin Hcl 0.4 mg capsule 1  capsule PO Q HS   #30  0 Refill(s)  Pharm[facility] Name:  [facility] [id number]  Address:  [address] [place], [place] [address]  Phone:  [phone]  Fax:  [phone]            Orders X-Rays: KUB          Schedule Return Visit/Planned Activity: Other See Visit Notes             Note: Will call to schedule surgery.          Document Letter(s):  Created for Patient: Clinical Summary         Notes:   1. 9 mm left mid ureteral calculus: Unfortunately, his stone is not well-visualized on KUB imaging and he would not be an appropriate candidate for ESWL. As such, we have discussed ureteroscopic laser lithotripsy as a definitive procedure for him. He will strain his urine and has been provided tamsulosin as well as pain medication with hydrocodone. He will be scheduled for cystoscopy, left retrograde pyelography, left ureteroscopy with laser lithotripsy and left ureteral stent  placement. We have reviewed the potential risks, complications, and the expected recovery process associated with this procedure. He gives informed consent today. He will notify should he develop uncontrolled pain, fever, or persistent nausea/vomiting.   CC: Dr. Jacquiline Doe    * Signed by Heloise Purpura, M.D. on 07/17/22 at 7:09 PM (EDT)*

## 2022-08-21 NOTE — H&P (Incomplete Revision)
  Office Visit Report     07/17/2022   --------------------------------------------------------------------------------   Jeff Graves  MRN: [id number]  DOB: 12/13/1945, 77 year old Male  SSN:    PRIMARY CARE:  Caleb Parker, MD  PRIMARY CARE FAX:  [phone]  REFERRING:    PROVIDER:  Mishal Probert, M.D.  LOCATION:  [facility] - [id number]     --------------------------------------------------------------------------------   CC/HPI: Left ureteral calculus   Jeff Graves is a 77-year-old gentleman who developed painless gross hematuria in March which resolved. He then developed the acute onset of more severe left-sided flank pain on Monday and presented to the emergency department for further evaluation. He denies fever or nausea or vomiting. He denies any hematuria. He does have a history of recurrent urolithiasis and has passed 3 stones spontaneously in the past. He has never required surgical intervention. He had a CT scan performed on Monday which confirmed a 9 mm mid left ureteral calculus and a 2 mm nonobstructing right renal calculus.     ALLERGIES: None   MEDICATIONS: Aspirin 81 mg tablet,chewable  Metoprolol Tartrate 25 mg tablet  Amlodipine Besylate 5 mg tablet  Atorvastatin Calcium 80 mg tablet  Loratadine 10 mg tablet     GU PSH: None   NON-GU PSH: Coronary Artery Bypass Grafting     GU PMH: None   NON-GU PMH: Hypercholesterolemia Hypertension    FAMILY HISTORY: 1 Daughter - Runs in Family   SOCIAL HISTORY: Marital Status: Married Preferred Language: English; Ethnicity: Not Hispanic Or Latino; Race: White Current Smoking Status: Patient does not smoke anymore.   Tobacco Use Assessment Completed: Used Tobacco in last 30 days? Has never drank.  Drinks 4+ caffeinated drinks per day.    REVIEW OF SYSTEMS:    GU Review Male:   Patient reports get up at night to urinate, stream starts and stops, trouble starting your streams, and  have to strain to urinate . Patient denies frequent urination, hard to postpone urination, burning/ pain with urination, and leakage of urine.  Gastrointestinal (Lower):   Patient denies diarrhea and constipation.  Gastrointestinal (Upper):   Patient denies nausea and vomiting.  Constitutional:   Patient denies fever, night sweats, weight loss, and fatigue.  Skin:   Patient denies skin rash/ lesion and itching.  Eyes:   Patient denies blurred vision and double vision.  Ears/ Nose/ Throat:   Patient denies sore throat and sinus problems.  Hematologic/Lymphatic:   Patient reports easy bruising. Patient denies swollen glands.  Cardiovascular:   Patient denies leg swelling and chest pains.  Respiratory:   Patient denies cough and shortness of breath.  Endocrine:   Patient denies excessive thirst.  Musculoskeletal:   Patient denies back pain and joint pain.  Neurological:   Patient denies headaches and dizziness.  Psychologic:   Patient denies depression and anxiety.   VITAL SIGNS: None   MULTI-SYSTEM PHYSICAL EXAMINATION:    Constitutional: Well-nourished. No physical deformities. Normally developed. Good grooming.  Respiratory: No labored breathing, no use of accessory muscles.   Cardiovascular: Normal temperature, normal extremity pulses, no swelling, no varicosities.  Gastrointestinal: Mild left CVA tenderness. No abdominal tenderness.     Complexity of Data:  X-Ray Review: KUB: Reviewed Films.  C.T. Abdomen/Pelvis: Reviewed Films.    Notes:                     CLINICAL DATA: Left-sided flank pain for 2-3 days with hematuria,  initial encounter     EXAM:  CT ABDOMEN AND PELVIS WITHOUT CONTRAST   TECHNIQUE:  Multidetector CT imaging of the abdomen and pelvis was performed  following the standard protocol without IV contrast.   RADIATION DOSE REDUCTION: This exam was performed according to the  departmental dose-optimization program which includes automated  exposure control,  adjustment of the mA and/or kV according to  patient size and/or use of iterative reconstruction technique.   COMPARISON: None Available.   FINDINGS:  Lower chest: No acute abnormality.   Hepatobiliary: No focal liver abnormality is seen. No gallstones,  gallbladder wall thickening, or biliary dilatation.   Pancreas: Unremarkable. No pancreatic ductal dilatation or  surrounding inflammatory changes.   Spleen: Normal in size without focal abnormality.   Adrenals/Urinary Tract: Adrenal glands are within normal limits.  Kidneys are well visualized bilaterally. Nonobstructing right renal  calculi are seen. No left renal calculi are noted. There is a 9 mm  stone identified in the left mid ureter best seen on image number 50  of series 2 and image number 42 of series 5. The more distal left  ureter is within normal limits. Phlebolith is noted adjacent to the  distal left ureter. Right collecting system and ureter are within  normal limits. The bladder is partially distended.   Stomach/Bowel: The appendix is well visualized and within normal  limits. No obstructive or inflammatory changes of the colon are  seen. Stomach and small bowel appear within normal limits.   Vascular/Lymphatic: Aortic atherosclerosis. No enlarged abdominal or  pelvic lymph nodes.   Reproductive: Prostate is unremarkable.   Other: No abdominal wall hernia or abnormality. No abdominopelvic  ascites.   Musculoskeletal: Degenerative changes of lumbar spine are noted. L1  chronic compression deformity is seen.   IMPRESSION:  9 mm left mid ureteral stone without significant obstructive change.   Nonobstructing right renal calculi measuring up to 2 mm.   No other focal abnormality is noted.    Electronically Signed  By: Mark Lukens M.D.  On: 07/15/2022 19:39   I independently reviewed his KUB x-ray. I cannot clearly identify is 9 mm mid ureteral calculus likely due to the fact that it is overlying his  pelvic bone. He has multiple pelvic phleboliths confirmed from his CT scan.   PROCEDURES:         KUB - [id number]  A single view of the abdomen is obtained.      Patient confirmed No Neulasta OnPro Device.           Urinalysis w/Scope Dipstick Dipstick Cont'd Micro  Color: Amber Bilirubin: Neg mg/dL WBC/hpf: 0 - 5/hpf  Appearance: Cloudy Ketones: Neg mg/dL RBC/hpf: >60/hpf  Specific Gravity: 1.020 Blood: 3+ ery/uL Bacteria: Rare (0-9/hpf)  pH: 6.5 Protein: 1+ mg/dL Cystals: NS (Not Seen)  Glucose: Neg mg/dL Urobilinogen: 0.2 mg/dL Casts: Hyaline    Nitrites: Neg Trichomonas: Not Present    Leukocyte Esterase: Neg leu/uL Mucous: Present      Epithelial Cells: 0 - 5/hpf      Yeast: NS (Not Seen)      Sperm: Not Present    ASSESSMENT:      ICD-10 Details  1 GU:   Renal calculus - N20.0   2   Ureteral calculus - N20.1    PLAN:            Medications New Meds: Hydrocodone-Acetaminophen 5 mg-325 mg tablet 1-2 tablet PO Q 6 H prn   #15  0 Refill(s)  Tamsulosin Hcl 0.4 mg capsule 1   capsule PO Q HS   #30  0 Refill(s)  Pharmacy Name:  [facility] [id number]  Address:  [address]   [place], [place] [address]  Phone:  [phone]  Fax:  [phone]            Orders X-Rays: KUB          Schedule Return Visit/Planned Activity: Other See Visit Notes             Note: Will call to schedule surgery.          Document Letter(s):  Created for Patient: Clinical Summary         Notes:   1. 9 mm left mid ureteral calculus: Unfortunately, his stone is not well-visualized on KUB imaging and he would not be an appropriate candidate for ESWL. As such, we have discussed ureteroscopic laser lithotripsy as a definitive procedure for him. He will strain his urine and has been provided tamsulosin as well as pain medication with hydrocodone. He will be scheduled for cystoscopy, left retrograde pyelography, left ureteroscopy with laser lithotripsy and left ureteral stent  placement. We have reviewed the potential risks, complications, and the expected recovery process associated with this procedure. He gives informed consent today. He will notify should he develop uncontrolled pain, fever, or persistent nausea/vomiting.   CC: Dr. Caleb Parker    * Signed by Reese Stockman, M.D. on 07/17/22 at 7:09 PM (EDT)*     

## 2022-08-22 ENCOUNTER — Other Ambulatory Visit: Payer: Self-pay

## 2022-08-22 ENCOUNTER — Ambulatory Visit (HOSPITAL_BASED_OUTPATIENT_CLINIC_OR_DEPARTMENT_OTHER): Payer: HMO | Admitting: Certified Registered Nurse Anesthetist

## 2022-08-22 ENCOUNTER — Ambulatory Visit (HOSPITAL_COMMUNITY): Payer: Self-pay | Admitting: Physician Assistant

## 2022-08-22 ENCOUNTER — Ambulatory Visit (HOSPITAL_COMMUNITY)
Admission: RE | Admit: 2022-08-22 | Discharge: 2022-08-22 | Disposition: A | Discharge status: Home / Self Care | Payer: HMO | Source: Ambulatory Visit | Attending: Urology | Admitting: Urology

## 2022-08-22 ENCOUNTER — Ambulatory Visit (HOSPITAL_COMMUNITY): Payer: HMO

## 2022-08-22 ENCOUNTER — Encounter (HOSPITAL_COMMUNITY)
Admission: RE | Disposition: A | Discharge status: Home / Self Care | Payer: Self-pay | Source: Ambulatory Visit | Attending: Urology

## 2022-08-22 ENCOUNTER — Encounter (HOSPITAL_COMMUNITY): Payer: Self-pay | Admitting: Urology

## 2022-08-22 DIAGNOSIS — N183 Chronic kidney disease, stage 3 unspecified: Secondary | ICD-10-CM

## 2022-08-22 DIAGNOSIS — Z79899 Other long term (current) drug therapy: Secondary | ICD-10-CM | POA: Diagnosis not present

## 2022-08-22 DIAGNOSIS — Z87891 Personal history of nicotine dependence: Secondary | ICD-10-CM | POA: Insufficient documentation

## 2022-08-22 DIAGNOSIS — I129 Hypertensive chronic kidney disease with stage 1 through stage 4 chronic kidney disease, or unspecified chronic kidney disease: Secondary | ICD-10-CM

## 2022-08-22 DIAGNOSIS — I25119 Atherosclerotic heart disease of native coronary artery with unspecified angina pectoris: Secondary | ICD-10-CM | POA: Diagnosis not present

## 2022-08-22 DIAGNOSIS — I4891 Unspecified atrial fibrillation: Secondary | ICD-10-CM | POA: Insufficient documentation

## 2022-08-22 DIAGNOSIS — I2510 Atherosclerotic heart disease of native coronary artery without angina pectoris: Secondary | ICD-10-CM | POA: Diagnosis not present

## 2022-08-22 DIAGNOSIS — N201 Calculus of ureter: Secondary | ICD-10-CM | POA: Diagnosis not present

## 2022-08-22 DIAGNOSIS — I10 Essential (primary) hypertension: Secondary | ICD-10-CM | POA: Insufficient documentation

## 2022-08-22 HISTORY — PX: CYSTOSCOPY/URETEROSCOPY/HOLMIUM LASER/STENT PLACEMENT: SHX6546

## 2022-08-22 SURGERY — CYSTOSCOPY/URETEROSCOPY/HOLMIUM LASER/STENT PLACEMENT
Anesthesia: General | Laterality: Left

## 2022-08-22 MED ORDER — PROPOFOL 10 MG/ML IV BOLUS
INTRAVENOUS | Status: AC
  Filled 2022-08-22: qty 20

## 2022-08-22 MED ORDER — LACTATED RINGERS IV SOLN: INTRAVENOUS | Status: DC

## 2022-08-22 MED ORDER — CHLORHEXIDINE GLUCONATE 0.12 % MT SOLN
15.0000 mL | Freq: Once | OROMUCOSAL | Status: AC
  Administered 2022-08-22: 15 mL via OROMUCOSAL

## 2022-08-22 MED ORDER — LIDOCAINE 2% (20 MG/ML) 5 ML SYRINGE
INTRAMUSCULAR | Status: DC | PRN
  Administered 2022-08-22: 60 mg via INTRAVENOUS

## 2022-08-22 MED ORDER — 0.9 % SODIUM CHLORIDE (POUR BTL) OPTIME
TOPICAL | Status: DC | PRN
  Administered 2022-08-22: 1000 mL

## 2022-08-22 MED ORDER — FENTANYL CITRATE PF 50 MCG/ML IJ SOSY: 25.0000 ug | PREFILLED_SYRINGE | INTRAMUSCULAR | Status: DC | PRN

## 2022-08-22 MED ORDER — DEXAMETHASONE SODIUM PHOSPHATE 10 MG/ML IJ SOLN
INTRAMUSCULAR | Status: DC | PRN
  Administered 2022-08-22: 10 mg via INTRAVENOUS

## 2022-08-22 MED ORDER — PROPOFOL 500 MG/50ML IV EMUL
INTRAVENOUS | Status: DC | PRN
  Administered 2022-08-22: 100 mg via INTRAVENOUS

## 2022-08-22 MED ORDER — ORAL CARE MOUTH RINSE: 15.0000 mL | Freq: Once | OROMUCOSAL | Status: AC

## 2022-08-22 MED ORDER — IOHEXOL 300 MG/ML  SOLN
INTRAMUSCULAR | Status: DC | PRN
  Administered 2022-08-22: 2 mL

## 2022-08-22 MED ORDER — ONDANSETRON HCL 4 MG/2ML IJ SOLN
INTRAMUSCULAR | Status: AC
  Filled 2022-08-22: qty 2

## 2022-08-22 MED ORDER — SODIUM CHLORIDE 0.9 % IR SOLN
Status: DC | PRN
  Administered 2022-08-22: 3000 mL

## 2022-08-22 MED ORDER — EPHEDRINE SULFATE-NACL 50-0.9 MG/10ML-% IV SOSY
PREFILLED_SYRINGE | INTRAVENOUS | Status: DC | PRN
  Administered 2022-08-22 (×2): 10 mg via INTRAVENOUS

## 2022-08-22 MED ORDER — CEFAZOLIN SODIUM-DEXTROSE 2-4 GM/100ML-% IV SOLN
2.0000 g | INTRAVENOUS | Status: AC
  Administered 2022-08-22: 2 g via INTRAVENOUS
  Filled 2022-08-22: qty 100

## 2022-08-22 MED ORDER — FENTANYL CITRATE (PF) 100 MCG/2ML IJ SOLN
INTRAMUSCULAR | Status: AC
  Filled 2022-08-22: qty 2

## 2022-08-22 MED ORDER — FENTANYL CITRATE (PF) 100 MCG/2ML IJ SOLN
INTRAMUSCULAR | Status: DC | PRN
  Administered 2022-08-22 (×4): 25 ug via INTRAVENOUS

## 2022-08-22 MED ORDER — ONDANSETRON HCL 4 MG/2ML IJ SOLN
INTRAMUSCULAR | Status: DC | PRN
  Administered 2022-08-22: 4 mg via INTRAVENOUS

## 2022-08-22 MED ORDER — DEXAMETHASONE SODIUM PHOSPHATE 10 MG/ML IJ SOLN
INTRAMUSCULAR | Status: AC
  Filled 2022-08-22: qty 1

## 2022-08-22 MED ORDER — GLYCOPYRROLATE PF 0.2 MG/ML IJ SOSY
PREFILLED_SYRINGE | INTRAMUSCULAR | Status: DC | PRN
  Administered 2022-08-22: .2 mg via INTRAVENOUS

## 2022-08-22 SURGICAL SUPPLY — 24 items
BAG COUNTER SPONGE SURGICOUNT (BAG) IMPLANT
BAG SPNG CNTER NS LX DISP (BAG)
BAG URO CATCHER STRL LF (MISCELLANEOUS) ×1 IMPLANT
BASKET ZERO TIP NITINOL 2.4FR (BASKET) IMPLANT
BSKT STON RTRVL ZERO TP 2.4FR (BASKET) ×1
CATH URETL OPEN END 6FR 70 (CATHETERS) IMPLANT
CLOTH BEACON ORANGE TIMEOUT ST (SAFETY) ×1 IMPLANT
GLOVE SURG LX STRL 7.5 STRW (GLOVE) ×1 IMPLANT
GOWN STRL REUS W/ TWL XL LVL3 (GOWN DISPOSABLE) ×1 IMPLANT
GOWN STRL REUS W/TWL XL LVL3 (GOWN DISPOSABLE) ×1
GUIDEWIRE STR DUAL SENSOR (WIRE) ×1 IMPLANT
GUIDEWIRE ZIPWRE .038 STRAIGHT (WIRE) IMPLANT
IV NS 1000ML (IV SOLUTION) ×1
IV NS 1000ML BAXH (IV SOLUTION) ×1 IMPLANT
KIT TURNOVER KIT A (KITS) IMPLANT
LASER FIB FLEXIVA PULSE ID 365 (Laser) IMPLANT
MANIFOLD NEPTUNE II (INSTRUMENTS) ×1 IMPLANT
PACK CYSTO (CUSTOM PROCEDURE TRAY) ×1 IMPLANT
SHEATH NAVIGATOR HD 12/14X36 (SHEATH) IMPLANT
STENT URET 6FRX26 CONTOUR (STENTS) IMPLANT
TRACTIP FLEXIVA PULS ID 200XHI (Laser) IMPLANT
TRACTIP FLEXIVA PULSE ID 200 (Laser)
TUBING CONNECTING 10 (TUBING) ×1 IMPLANT
TUBING UROLOGY SET (TUBING) ×1 IMPLANT

## 2022-08-22 NOTE — Op Note (Signed)
Preoperative diagnosis: Left ureteral calculus  Postoperative diagnosis: Left ureteral calculi  Procedure:  Cystoscopy Left ureteroscopy and stone removal Ureteroscopic laser lithotripsy Left ureteral stent placement (6 x 26 with string) Left retrograde pyelography with interpretation  Surgeon: Moody Bruins. M.D.  Anesthesia: General  Complications: None  Intraoperative findings: Left retrograde pyelography demonstrated two filling defects within the distal left ureter consistent with the patient's known stone disease without other abnormalities.  EBL: Minimal  Specimens: Left ureteral calculi  Disposition of specimens: [facility] for stone analysis  Indication: Jeff Graves is a 77 y.o. year old patient with urolithiasis. He recently presented with a left ureteral stone measuring about 9 mm.  After reviewing the management options for treatment, the patient elected to proceed with the above surgical procedure(s). We have discussed the potential benefits and risks of the procedure, side effects of the proposed treatment, the likelihood of the patient achieving the goals of the procedure, and any potential problems that might occur during the procedure or recuperation. Informed consent has been obtained.  Description of procedure:  The patient was taken to the operating room and general anesthesia was induced.  The patient was placed in the dorsal lithotomy position, prepped and draped in the usual sterile fashion, and preoperative antibiotics were administered. A preoperative time-out was performed.   Cystourethroscopy was performed.  The patient's urethra was examined and was demonstrated bilobar prostatic hypertrophy with a median lobe. The bladder was then systematically examined in its entirety. There was no evidence for any bladder tumors, stones, or other mucosal pathology.    Attention then turned to the left ureteral orifice and a ureteral  catheter was used to intubate the ureteral orifice.  Omnipaque contrast was injected through the ureteral catheter and a retrograde pyelogram was performed with findings as dictated above.  There were actually two distal stones noted.  A 0.38 sensor guidewire was then advanced up the left ureter into the renal pelvis under fluoroscopic guidance. The 6 Fr semirigid ureteroscope was then advanced into the ureter next to the guidewire and the calculi were identified.   The stones were then fragmented with the 365 micron holmium laser fiber on a setting of 0.6 J and frequency of 6 Hz.   All stones were then removed from the ureter with a zero tip nitinol basket.  Reinspection of the ureter revealed no remaining visible stones or fragments.   The wire was then backloaded through the cystoscope and a ureteral stent was advance over the wire using Seldinger technique.  The stent was positioned appropriately under fluoroscopic and cystoscopic guidance.  The wire was then removed with an adequate stent curl noted in the renal pelvis as well as in the bladder.  The bladder was then emptied and the procedure ended.  The patient appeared to tolerate the procedure well and without complications.  The patient was able to be awakened and transferred to the recovery unit in satisfactory condition.

## 2022-08-22 NOTE — OR Nursing (Signed)
Stone taken by Dr. Borden 

## 2022-08-22 NOTE — Anesthesia Procedure Notes (Signed)
Procedure Name: LMA Insertion Date/Time: 08/22/2022 2:02 PM  Performed by: Basilio Cairo, CRNAPre-anesthesia Checklist: Patient identified, Patient being monitored, Timeout performed, Emergency Drugs available and Suction available Patient Re-evaluated:Patient Re-evaluated prior to induction Oxygen Delivery Method: Circle system utilized Preoxygenation: Pre-oxygenation with 100% oxygen Induction Type: IV induction Ventilation: Mask ventilation without difficulty LMA: LMA inserted and LMA with gastric port inserted LMA Size: 4.0 Tube type: Oral Number of attempts: 1 Placement Confirmation: positive ETCO2 and breath sounds checked- equal and bilateral Tube secured with: Tape Dental Injury: Teeth and Oropharynx as per pre-operative assessment

## 2022-08-22 NOTE — Anesthesia Postprocedure Evaluation (Signed)
Anesthesia Post Note  Patient: Jeff Graves  Procedure(s) Performed: CYSTOSCOPY/LEFT RETROGRADE PYELOGRAM/ LEFT URETEROSCOPY/HOLMIUM LASER/LEFT URETERAL STENT PLACEMENT (Left)     Patient location during evaluation: PACU Anesthesia Type: General Level of consciousness: awake and alert Pain management: pain level controlled Vital Signs Assessment: post-procedure vital signs reviewed and stable Respiratory status: spontaneous breathing, nonlabored ventilation, respiratory function stable and patient connected to nasal cannula oxygen Cardiovascular status: blood pressure returned to baseline and stable Postop Assessment: no apparent nausea or vomiting Anesthetic complications: no  No notable events documented.  Last Vitals:  Vitals:   08/22/22 1526 08/22/22 1530  BP: (!) 154/85 (!) 156/81  Pulse: 68 63  Resp:    Temp: 36.5 C   SpO2: 97% 97%    Last Pain:  Vitals:   08/22/22 1526  TempSrc:   PainSc: 0-No pain                 Diego Ulbricht L Annabel Gibeau

## 2022-08-22 NOTE — Transfer of Care (Signed)
Immediate Anesthesia Transfer of Care Note  Patient: Jeff Graves  Procedure(s) Performed: Procedure(s) with comments: CYSTOSCOPY/LEFT RETROGRADE PYELOGRAM/ LEFT URETEROSCOPY/HOLMIUM LASER/LEFT URETERAL STENT PLACEMENT (Left) - 45 MINUTES NEEDED FOR CASE  Patient Location: PACU  Anesthesia Type:General  Level of Consciousness: Alert, Awake, Oriented  Airway & Oxygen Therapy: Patient Spontanous Breathing  Post-op Assessment: Report given to RN  Post vital signs: Reviewed and stable  Last Vitals:  Vitals:   08/22/22 1318  BP: (!) 157/84  Pulse: (!) 55  Resp: 16  Temp: 36.7 C  SpO2: 96%    Complications: No apparent anesthesia complications

## 2022-08-22 NOTE — Discharge Instructions (Addendum)
You may see some blood in the urine and may have some burning with urination for 48-72 hours. You also may notice that you have to urinate more frequently or urgently after your procedure which is normal.  You should call should you develop an inability urinate, fever > 101, persistent nausea and vomiting that prevents you from eating or drinking to stay hydrated.  If you have a stent, you will likely urinate more frequently and urgently until the stent is removed and you may experience some discomfort/pain in the lower abdomen and flank especially when urinating. You may take pain medication prescribed to you if needed for pain. You may also intermittently have blood in the urine until the stent is removed. You may remove your stent on Monday morning.  Simply pull the string that is taped to your body and the stent will easily come out.  This may be best done in the shower as some urine may come out with the stent.  Usually you will feel relief once the stent is removed, but occasionally patients can develop pain due to residual swelling of the ureter that may temporarily obstruct the kidney.  This can be managed by taking pain medication and it will typically resolve with time.  Please do not hesitate to call if you have pain that is not controlled with your pain medication or does not improved within 24-48 hours.  

## 2022-08-23 ENCOUNTER — Encounter (HOSPITAL_COMMUNITY): Payer: Self-pay | Admitting: Urology

## 2022-09-17 DIAGNOSIS — N201 Calculus of ureter: Secondary | ICD-10-CM | POA: Diagnosis not present

## 2022-11-14 NOTE — Progress Notes (Signed)
Chief Complaint  Patient presents with   Follow-up    CAD    History of Present Illness: 77 yo male with history of HLD, HTN, CKD, atrial tachycardia, SVT, PACs, PVCs and CAD s/p CABG October 2020 who is here today for cardiac follow up. He was admitted to [facility] October 2020 with unstable angina. Cardiac cath October 2020 with severe three vessel CAD. He underwent 4V CABG on 11/23/18 (LIMA to LAD, SVG to Circumflex, SVG sequential to PDA and PLA). He had a postoperative pneumothorax and post-operative atrial fibrillation. He converted to sinus quickly on amiodarone. Cardiac monitor in April 2023 with sinus and SVT. Echo in 2023 with normal LV systolic function. He is followed in our EP clinic by Dr. Graciela Husbands. Nuclear stress test in May 2024 with no ischemia.   He is here today for follow up. The patient denies any chest pain, dyspnea, palpitations, lower extremity edema, orthopnea, PND, dizziness, near syncope or syncope.   Primary Care Physician: Ardith Dark, MD  Past Medical History:  Diagnosis Date   A-fib [facility])    Episode after surgery   Allergy    seasonal   Atrial tachycardia (HCC) 06/04/2012   12/13-QRS duration-102 ms 6/14-QRS duration-98 ms    CAD (coronary artery disease)    a. s/p CABG 11/2018   CKD (chronic kidney disease), stage II    DDD (degenerative disc disease)    Diverticulosis of colon    Hemorrhoids    History of kidney stones    Hypercholesterolemia    Hypertension    NSVT (nonsustained ventricular tachycardia) (HCC)    PAC (premature atrial contraction)    Pneumothorax    Postoperative atrial fibrillation (HCC)    Renal calculus    RENAL CALCULUS, HX OF 12/26/2006   Qualifier: Diagnosis of  By: Jerolyn Shin     S/P rotator cuff surgery 04/15/2014   Shoulder pain    Sinus bradycardia     Past Surgical History:  Procedure Laterality Date   COLONOSCOPY     CORONARY ARTERY BYPASS GRAFT N/A 11/23/2018   Procedure: CORONARY ARTERY BYPASS GRAFTING  (CABG), ON PUMP, TIMES FOUR, LIMA to LAD, SEQ SVG to PDA, PLVB, SVG to CIRCUMFLEX, USING LEFT INTERNAL MAMMARY ARTERY AND RIGHT GREATER SAPHENOUS VEIN HARVESTED ENDOSCOPICALLY AND LEFT GREATER SAPHENOUS VEIN;  Surgeon: Delight Ovens, MD;  Location: [facility] OR;  Service: Open Heart Surgery;  Laterality: N/A;   CYSTOSCOPY/URETEROSCOPY/HOLMIUM LASER/STENT PLACEMENT Left 08/22/2022   Procedure: CYSTOSCOPY/LEFT RETROGRADE PYELOGRAM/ LEFT URETEROSCOPY/HOLMIUM LASER/LEFT URETERAL STENT PLACEMENT;  Surgeon: Heloise Purpura, MD;  Location: [facility] ORS;  Service: Urology;  Laterality: Left;  45 MINUTES NEEDED FOR CASE   LEFT HEART CATH AND CORONARY ANGIOGRAPHY N/A 11/18/2018   Procedure: LEFT HEART CATH AND CORONARY ANGIOGRAPHY;  Surgeon: Kathleene Hazel, MD;  Location: [facility] INVASIVE CV LAB;  Service: Cardiovascular;  Laterality: N/A;   None     SHOULDER SURGERY Left 03/29/2014   TEE WITHOUT CARDIOVERSION N/A 11/23/2018   Procedure: TRANSESOPHAGEAL ECHOCARDIOGRAM (TEE);  Surgeon: Delight Ovens, MD;  Location: [facility] OR;  Service: Open Heart Surgery;  Laterality: N/A;    Current Outpatient Medications  Medication Sig Dispense Refill   amLODipine (NORVASC) 5 MG tablet Take 1 tablet (5 mg total) by mouth daily. 90 tablet 3   aspirin EC 81 MG tablet Take 81 mg by mouth daily. Swallow whole.     atorvastatin (LIPITOR) 80 MG tablet TAKE ONE TABLET BY MOUTH DAILY (Patient taking differently: Take 40 mg  by mouth every evening.) 90 tablet 3   Cholecalciferol (VITAMIN D) 2000 units CAPS Take 2,000 Units by mouth daily.     Cyanocobalamin (VITAMIN B-12) 5000 MCG LOZG Take 5,000 mcg by mouth daily.      loratadine (CLARITIN) 10 MG tablet Take 10 mg by mouth daily as needed for allergies.      metoprolol tartrate (LOPRESSOR) 25 MG tablet TAKE ONE TABLET BY MOUTH TWICE A DAY 180 tablet 1   Multiple Vitamins-Minerals (MULTIVITAMIN WITH MINERALS) tablet Take 1 tablet by mouth daily.     No current facility-administered  medications for this visit.    No Known Allergies  Social History   Socioeconomic History   Marital status: Married    Spouse name: brenda   Number of children: 1   Years of education: 12   Highest education level: High school graduate  Occupational History   Occupation: HVAC with air quality    Employer: [employer]  Tobacco Use   Smoking status: Former    Current packs/day: 0.00    Average packs/day: 0.5 packs/day for 20.0 years (10.0 ttl pk-yrs)    Types: Cigarettes    Start date: 02/12/1964    Quit date: 02/12/1984    Years since quitting: 38.7   Smokeless tobacco: Never  Vaping Use   Vaping status: Never Used  Substance and Sexual Activity   Alcohol use: No    Alcohol/week: 0.0 standard drinks of alcohol    Comment: social use   Drug use: No   Sexual activity: Not on file  Other Topics Concern   Not on file  Social History Narrative   Not on file   Social Determinants of Health   Financial Resource Strain: Low Risk  (12/10/2021)   Overall Financial Resource Strain (CARDIA)    Difficulty of Paying Living Expenses: Not hard at all  Food Insecurity: No Food Insecurity (12/10/2021)   Hunger Vital Sign    Worried About Running Out of Food in the Last Year: Never true    Ran Out of Food in the Last Year: Never true  Transportation Needs: No Transportation Needs (12/10/2021)   PRAPARE - Administrator, Civil Service (Medical): No    Lack of Transportation (Non-Medical): No  Physical Activity: Sufficiently Active (12/10/2021)   Exercise Vital Sign    Days of Exercise per Week: 4 days    Minutes of Exercise per Session: 60 min  Stress: No Stress Concern Present (12/10/2021)   [employer] of Occupational Health - Occupational Stress Questionnaire    Feeling of Stress : Not at all  Social Connections: Moderately Integrated (12/10/2021)   Social Connection and Isolation Panel [NHANES]    Frequency of Communication with Friends and Family: More  than three times a week    Frequency of Social Gatherings with Friends and Family: More than three times a week    Attends Religious Services: More than 4 times per year    Active Member of [employer] or Organizations: No    Attends Banker Meetings: Never    Marital Status: Married  Catering manager Violence: Not At Risk (12/10/2021)   Humiliation, Afraid, Rape, and Kick questionnaire    Fear of Current or Ex-Partner: No    Emotionally Abused: No    Physically Abused: No    Sexually Abused: No    Family History  Problem Relation Age of Onset   Heart attack Father 78   Valvular heart disease Mother 60  Lymphoma Sister        Deceased   Breast cancer Sister        Alive   Colon cancer Neg Hx    Esophageal cancer Neg Hx    Rectal cancer Neg Hx    Stomach cancer Neg Hx     Review of Systems:  As stated in the HPI and otherwise negative.   BP 110/68   Pulse 63   Ht 5\' 10"  (1.778 m)   Wt 70.3 kg   SpO2 96%   BMI 22.24 kg/m   Physical Examination:  General: Well developed, well nourished, NAD  HEENT: OP clear, mucus membranes moist  SKIN: warm, dry. No rashes. Neuro: No focal deficits  Musculoskeletal: Muscle strength 5/5 all ext  Psychiatric: Mood and affect normal  Neck: No JVD, no carotid bruits, no thyromegaly, no lymphadenopathy.  Lungs:Clear bilaterally, no wheezes, rhonci, crackles Cardiovascular: Regular rate and rhythm. No murmurs, gallops or rubs. Abdomen:Soft. Bowel sounds present. Non-tender.  Extremities: No lower extremity edema. Pulses are 2 + in the bilateral DP/PT.  EKG:  EKG is not ordered today. The ekg ordered today demonstrates   Recent Labs: 04/02/2022: ALT 27 07/15/2022: Hemoglobin 12.6; Platelets 262 08/12/2022: BUN 22; Creatinine, Ser 1.39; Potassium 4.2; Sodium 138   Lipid Panel    Component Value Date/Time   CHOL 126 04/02/2022 0749   TRIG 41 04/02/2022 0749   HDL 73 04/02/2022 0749   CHOLHDL 1.7 04/02/2022 0749   CHOLHDL 2  02/16/2021 0846   VLDL 8.0 02/16/2021 0846   LDLCALC 43 04/02/2022 0749     Wt Readings from Last 3 Encounters:  11/15/22 70.3 kg  08/22/22 69.4 kg  08/12/22 69.2 kg    Assessment and Plan:   1. CAD s/p CABG without angina: No chest pain suggestive of angina. Will continue ASA, beta blocker and statin.    2.Post-operative atrial fibrillation: No known recurrence of atrial fibrillation.    3. Hyperlipidemia: LDL at goal in February 2024. Continue statin   4. SVT/PVCs/PACs: Followed by Dr. Graciela Husbands. Continue metoprolol  Labs/ tests ordered today include:  No orders of the defined types were placed in this encounter.  Disposition:   F/U with me in 12 months  Signed, Verne Carrow, MD 11/15/2022 10:40 AM    [facility] [address] [place], [place], [place]  [address] Phone: [phone]; Fax: [phone]

## 2022-11-15 ENCOUNTER — Ambulatory Visit: Payer: HMO | Attending: Cardiovascular Disease | Admitting: Cardiovascular Disease

## 2022-11-15 ENCOUNTER — Encounter: Payer: Self-pay | Admitting: Cardiovascular Disease

## 2022-11-15 VITALS — BP 110/68 | HR 63 | Ht 70.0 in | Wt 155.0 lb

## 2022-11-15 DIAGNOSIS — I471 Supraventricular tachycardia, unspecified: Secondary | ICD-10-CM

## 2022-11-15 DIAGNOSIS — E785 Hyperlipidemia, unspecified: Secondary | ICD-10-CM | POA: Diagnosis not present

## 2022-11-15 DIAGNOSIS — I2510 Atherosclerotic heart disease of native coronary artery without angina pectoris: Secondary | ICD-10-CM

## 2022-11-15 NOTE — Patient Instructions (Signed)

## 2022-12-16 ENCOUNTER — Ambulatory Visit (INDEPENDENT_AMBULATORY_CARE_PROVIDER_SITE_OTHER): Payer: HMO

## 2022-12-16 VITALS — BP 130/78 | HR 60 | Temp 98.2°F | Wt 155.8 lb

## 2022-12-16 DIAGNOSIS — Z23 Encounter for immunization: Secondary | ICD-10-CM

## 2022-12-16 DIAGNOSIS — Z0000 Encounter for general adult medical examination without abnormal findings: Secondary | ICD-10-CM | POA: Diagnosis not present

## 2022-12-16 NOTE — Patient Instructions (Signed)
Mr. Fidel , Thank you for taking time to come for your Medicare Wellness Visit. I appreciate your ongoing commitment to your health goals. Please review the following plan we discussed and let me know if I can assist you in the future.   Referrals/Orders/Follow-Ups/Clinician Recommendations: maintain health and activity    This is a list of the screening recommended for you and due dates:  Health Maintenance  Topic Date Due   DTaP/Tdap/Td vaccine (2 - Td or Tdap) 01/22/2022   Flu Shot  09/12/2022   COVID-19 Vaccine (4 - 2023-24 season) 10/13/2022   Colon Cancer Screening  04/03/2023   Hepatitis C Screening  06/07/2028*   Medicare Annual Wellness Visit  12/16/2023   Pneumonia Vaccine  Completed   Zoster (Shingles) Vaccine  Completed   HPV Vaccine  Aged Out  *Topic was postponed. The date shown is not the original due date.    Advanced directives: (Declined) Advance directive discussed with you today. Even though you declined this today, please call our office should you change your mind, and we can give you the proper paperwork for you to fill out.  Next Medicare Annual Wellness Visit scheduled for next year: Yes

## 2022-12-16 NOTE — Progress Notes (Signed)
Subjective:   Jeff Graves is a 77 y.o. male who presents for Medicare Annual/Subsequent preventive examination.  Visit Complete: In person  Cardiac Risk Factors include: advanced age (>25men, >69 women);dyslipidemia;hypertension;male gender     Objective:    Today's Vitals   12/16/22 0958  BP: 130/78  Pulse: 60  Temp: 98.2 F (36.8 C)  SpO2: 99%  Weight: 155 lb 12.8 oz (70.7 kg)   Body mass index is 22.35 kg/m.     12/16/2022   10:04 AM 08/12/2022    9:52 AM 07/15/2022    5:18 PM 12/10/2021    9:42 AM 06/08/2019    2:02 PM 11/18/2018    6:46 AM  Advanced Directives  Does Patient Have a Medical Advance Directive? No No No Yes Yes No  Type of Aeronautical engineer of [place];Living will Living will;Healthcare Power of Attorney   Does patient want to make changes to medical advance directive?     No - Patient declined   Copy of Healthcare Power of Attorney in Chart?    No - copy requested No - copy requested   Would patient like information on creating a medical advance directive? No - Patient declined No - Patient declined No - Patient declined   No - Patient declined    Current Medications (verified) Outpatient Encounter Medications as of 12/16/2022  Medication Sig   amLODipine (NORVASC) 5 MG tablet Take 1 tablet (5 mg total) by mouth daily.   aspirin EC 81 MG tablet Take 81 mg by mouth daily. Swallow whole.   atorvastatin (LIPITOR) 80 MG tablet TAKE ONE TABLET BY MOUTH DAILY (Patient taking differently: Take 40 mg by mouth every evening.)   Cholecalciferol (VITAMIN D) 2000 units CAPS Take 2,000 Units by mouth daily.   Cyanocobalamin (VITAMIN B-12) 5000 MCG LOZG Take 5,000 mcg by mouth daily.    loratadine (CLARITIN) 10 MG tablet Take 10 mg by mouth daily as needed for allergies.    metoprolol tartrate (LOPRESSOR) 25 MG tablet TAKE ONE TABLET BY MOUTH TWICE A DAY   Multiple Vitamins-Minerals (MULTIVITAMIN WITH MINERALS) tablet Take 1 tablet by mouth  daily.   No facility-administered encounter medications on file as of 12/16/2022.    Allergies (verified) Patient has no known allergies.   History: Past Medical History:  Diagnosis Date   A-fib [facility])    Episode after surgery   Allergy    seasonal   Atrial tachycardia (HCC) 06/04/2012   12/13-QRS duration-102 ms 6/14-QRS duration-98 ms    CAD (coronary artery disease)    a. s/p CABG 11/2018   CKD (chronic kidney disease), stage II    DDD (degenerative disc disease)    Diverticulosis of colon    Hemorrhoids    History of kidney stones    Hypercholesterolemia    Hypertension    NSVT (nonsustained ventricular tachycardia) (HCC)    PAC (premature atrial contraction)    Pneumothorax    Postoperative atrial fibrillation (HCC)    Renal calculus    RENAL CALCULUS, HX OF 12/26/2006   Qualifier: Diagnosis of  By: Jerolyn Shin     S/P rotator cuff surgery 04/15/2014   Shoulder pain    Sinus bradycardia    Past Surgical History:  Procedure Laterality Date   COLONOSCOPY     CORONARY ARTERY BYPASS GRAFT N/A 11/23/2018   Procedure: CORONARY ARTERY BYPASS GRAFTING (CABG), ON PUMP, TIMES FOUR, LIMA to LAD, SEQ SVG to PDA, PLVB, SVG to CIRCUMFLEX, USING  LEFT INTERNAL MAMMARY ARTERY AND RIGHT GREATER SAPHENOUS VEIN HARVESTED ENDOSCOPICALLY AND LEFT GREATER SAPHENOUS VEIN;  Surgeon: Delight Ovens, MD;  Location: [facility] OR;  Service: Open Heart Surgery;  Laterality: N/A;   CYSTOSCOPY/URETEROSCOPY/HOLMIUM LASER/STENT PLACEMENT Left 08/22/2022   Procedure: CYSTOSCOPY/LEFT RETROGRADE PYELOGRAM/ LEFT URETEROSCOPY/HOLMIUM LASER/LEFT URETERAL STENT PLACEMENT;  Surgeon: Heloise Purpura, MD;  Location: [facility] ORS;  Service: Urology;  Laterality: Left;  45 MINUTES NEEDED FOR CASE   LEFT HEART CATH AND CORONARY ANGIOGRAPHY N/A 11/18/2018   Procedure: LEFT HEART CATH AND CORONARY ANGIOGRAPHY;  Surgeon: Kathleene Hazel, MD;  Location: [facility] INVASIVE CV LAB;  Service: Cardiovascular;  Laterality: N/A;    None     SHOULDER SURGERY Left 03/29/2014   TEE WITHOUT CARDIOVERSION N/A 11/23/2018   Procedure: TRANSESOPHAGEAL ECHOCARDIOGRAM (TEE);  Surgeon: Delight Ovens, MD;  Location: [facility] OR;  Service: Open Heart Surgery;  Laterality: N/A;   Family History  Problem Relation Age of Onset   Heart attack Father 29   Valvular heart disease Mother 83   Lymphoma Sister        Deceased   Breast cancer Sister        Alive   Colon cancer Neg Hx    Esophageal cancer Neg Hx    Rectal cancer Neg Hx    Stomach cancer Neg Hx    Social History   Socioeconomic History   Marital status: Married    Spouse name: brenda   Number of children: 1   Years of education: 12   Highest education level: High school graduate  Occupational History   Occupation: HVAC with air quality    Employer: [employer]  Tobacco Use   Smoking status: Former    Current packs/day: 0.00    Average packs/day: 0.5 packs/day for 20.0 years (10.0 ttl pk-yrs)    Types: Cigarettes    Start date: 02/12/1964    Quit date: 02/12/1984    Years since quitting: 38.8   Smokeless tobacco: Never  Vaping Use   Vaping status: Never Used  Substance and Sexual Activity   Alcohol use: No    Alcohol/week: 0.0 standard drinks of alcohol    Comment: social use   Drug use: No   Sexual activity: Not on file  Other Topics Concern   Not on file  Social History Narrative   Not on file   Social Determinants of Health   Financial Resource Strain: Low Risk  (12/16/2022)   Overall Financial Resource Strain (CARDIA)    Difficulty of Paying Living Expenses: Not hard at all  Food Insecurity: No Food Insecurity (12/16/2022)   Hunger Vital Sign    Worried About Running Out of Food in the Last Year: Never true    Ran Out of Food in the Last Year: Never true  Transportation Needs: No Transportation Needs (12/16/2022)   PRAPARE - Administrator, Civil Service (Medical): No    Lack of Transportation (Non-Medical): No  Physical  Activity: Sufficiently Active (12/16/2022)   Exercise Vital Sign    Days of Exercise per Week: 4 days    Minutes of Exercise per Session: 60 min  Stress: No Stress Concern Present (12/16/2022)   [employer] of Occupational Health - Occupational Stress Questionnaire    Feeling of Stress : Not at all  Social Connections: Moderately Integrated (12/16/2022)   Social Connection and Isolation Panel [NHANES]    Frequency of Communication with Friends and Family: More than three times a week  Frequency of Social Gatherings with Friends and Family: More than three times a week    Attends Religious Services: More than 4 times per year    Active Member of [employer] or Organizations: No    Attends Engineer, structural: Never    Marital Status: Married    Tobacco Counseling Counseling given: Not Answered   Clinical Intake:  Pre-visit preparation completed: Yes  Pain : No/denies pain     BMI - recorded: 22.35 Nutritional Status: BMI of 19-24  Normal Nutritional Risks: None Diabetes: No  How often do you need to have someone help you when you read instructions, pamphlets, or other written materials from your doctor or pharmacy?: 1 - Never  Interpreter Needed?: No  Information entered by :: Lanier Ensign, LPN   Activities of Daily Living    12/16/2022   10:02 AM 08/12/2022    9:51 AM  In your present state of health, do you have any difficulty performing the following activities:  Hearing? 0 0  Vision? 0 0  Difficulty concentrating or making decisions? 0 0  Walking or climbing stairs? 0 0  Dressing or bathing? 0 0  Doing errands, shopping? 0 0  Preparing Food and eating ? N   Using the Toilet? N   In the past six months, have you accidently leaked urine? N   Do you have problems with loss of bowel control? N   Managing your Medications? N   Managing your Finances? N   Housekeeping or managing your Housekeeping? N     Patient Care Team: Ardith Dark, MD as PCP  - General (Family Medicine) Kathleene Hazel, MD as PCP - Cardiology (Cardiology) Duke Salvia, MD as PCP - Electrophysiology (Cardiology) Fredrich Birks, OD as Referring Physician (Optometry)  Indicate any recent Medical Services you may have received from other than [facility] providers in the past year (date may be approximate).     Assessment:   This is a routine wellness examination for Marquelle.  Hearing/Vision screen Hearing Screening - Comments:: Pt denies any hearing issues  Vision Screening - Comments:: Pt follows up with Dr Lorin Picket for annual eye exams    Goals Addressed             This Visit's Progress    Patient Stated       Maintain health and activity        Depression Screen    12/16/2022   10:04 AM 04/08/2022   12:01 PM 12/10/2021    9:40 AM 02/16/2021    8:23 AM 06/08/2019    2:04 PM 01/22/2019    2:27 PM 01/05/2019    3:12 PM  PHQ 2/9 Scores  PHQ - 2 Score 0 0 0 0 0 0 0    Fall Risk    12/16/2022   10:06 AM 04/08/2022   12:01 PM 12/10/2021    9:43 AM 02/16/2021    8:23 AM 06/08/2019    2:04 PM  Fall Risk   Falls in the past year? 0 0 0 0 0  Number falls in past yr: 0 0 0 0 0  Injury with Fall? 0 0 0 0 0  Risk for fall due to : No Fall Risks No Fall Risks Impaired vision    Follow up Falls prevention discussed  Falls prevention discussed Falls evaluation completed Falls evaluation completed;Education provided;Falls prevention discussed    MEDICARE RISK AT HOME: Medicare Risk at Home Any stairs in or around the home?: Yes  If so, are there any without handrails?: No Home free of loose throw rugs in walkways, pet beds, electrical cords, etc?: Yes Adequate lighting in your home to reduce risk of falls?: Yes Life alert?: No Use of a cane, walker or w/c?: No Grab bars in the bathroom?: No Shower chair or bench in shower?: No Elevated toilet seat or a handicapped toilet?: No  TIMED UP AND GO:  Was the test performed?  Yes  Length of time to  ambulate 10 feet: 10 sec Gait steady and fast without use of assistive device    Cognitive Function:        12/16/2022   10:06 AM 12/10/2021    9:44 AM 06/08/2019    2:03 PM  6CIT Screen  What Year? 0 points 0 points 0 points  What month? 0 points 0 points 0 points  What time? 0 points 0 points 0 points  Count back from 20 0 points 0 points 0 points  Months in reverse 0 points 0 points 0 points  Repeat phrase 0 points 0 points 0 points  Total Score 0 points 0 points 0 points    Immunizations Immunization History  Administered Date(s) Administered   Fluad Quad(high Dose 65+) 12/10/2021   Fluad Trivalent(High Dose 65+) 12/16/2022   H1N1 01/21/2008   Influenza Split 01/22/2011, 01/23/2012, 12/06/2012   Influenza Whole 01/21/2007, 01/20/2009, 12/05/2009   Influenza, High Dose Seasonal PF 10/30/2016, 01/03/2018, 01/17/2018   Influenza,inj,Quad PF,6+ Mos 12/15/2013, 11/12/2014   Influenza-Unspecified 12/09/2018, 11/12/2019, 11/20/2020, 12/08/2020, 11/30/2021   Moderna Sars-Covid-2 Vaccination 03/19/2019, 04/21/2019, 12/23/2019   Pneumococcal Conjugate-13 04/15/2014   Pneumococcal Polysaccharide-23 04/16/2016   Tdap 01/23/2012   Zoster Recombinant(Shingrix) 03/22/2017, 06/16/2017, 10/26/2017    TDAP status: Due, Education has been provided regarding the importance of this vaccine. Advised may receive this vaccine at local pharmacy or Health Dept. Aware to provide a copy of the vaccination record if obtained from local pharmacy or Health Dept. Verbalized acceptance and understanding.  Flu Vaccine status: Completed at today's visit  Pneumococcal vaccine status: Up to date  Covid-19 vaccine status: Information provided on how to obtain vaccines.   Qualifies for Shingles Vaccine? Yes   Zostavax completed Yes   Shingrix Completed?: Yes  Screening Tests Health Maintenance  Topic Date Due   DTaP/Tdap/Td (2 - Td or Tdap) 01/22/2022   COVID-19 Vaccine (4 - 2023-24 season)  10/13/2022   Colonoscopy  04/03/2023   Hepatitis C Screening  06/07/2028 (Originally 02/23/1963)   Medicare Annual Wellness (AWV)  12/16/2023   Pneumonia Vaccine 36+ Years old  Completed   INFLUENZA VACCINE  Completed   Zoster Vaccines- Shingrix  Completed   HPV VACCINES  Aged Out    Health Maintenance  Health Maintenance Due  Topic Date Due   DTaP/Tdap/Td (2 - Td or Tdap) 01/22/2022   COVID-19 Vaccine (4 - 2023-24 season) 10/13/2022   Colonoscopy  04/03/2023    Colorectal cancer screening: Type of screening: Colonoscopy. Completed 04/02/13. Repeat every 10 years   Additional Screening:  Hepatitis C Screening: does qualify;  Vision Screening: Recommended annual ophthalmology exams for early detection of glaucoma and other disorders of the eye. Is the patient up to date with their annual eye exam?  Yes  Who is the provider or what is the name of the office in which the patient attends annual eye exams? Dr Lorin Picket  If pt is not established with a provider, would they like to be referred to a provider to establish care? No .  Dental Screening: Recommended annual dental exams for proper oral hygiene   Community Resource Referral / Chronic Care Management: CRR required this visit?  No   CCM required this visit?  No     Plan:     I have personally reviewed and noted the following in the patient's chart:   Medical and social history Use of alcohol, tobacco or illicit drugs  Current medications and supplements including opioid prescriptions. Patient is not currently taking opioid prescriptions. Functional ability and status Nutritional status Physical activity Advanced directives List of other physicians Hospitalizations, surgeries, and ER visits in previous 12 months Vitals Screenings to include cognitive, depression, and falls Referrals and appointments  In addition, I have reviewed and discussed with patient certain preventive protocols, quality metrics, and best  practice recommendations. A written personalized care plan for preventive services as well as general preventive health recommendations were provided to patient.     Marzella Schlein, LPN   09/17/5782   After Visit Summary: (In Person-Printed) AVS printed and given to the patient  Nurse Notes: none

## 2023-01-20 ENCOUNTER — Ambulatory Visit (INDEPENDENT_AMBULATORY_CARE_PROVIDER_SITE_OTHER): Payer: HMO | Admitting: Physician Assistant

## 2023-01-20 ENCOUNTER — Encounter: Payer: Self-pay | Admitting: Physician Assistant

## 2023-01-20 DIAGNOSIS — R059 Cough, unspecified: Secondary | ICD-10-CM | POA: Diagnosis not present

## 2023-01-20 LAB — POC COVID19 BINAXNOW: SARS Coronavirus 2 Ag: NEGATIVE

## 2023-01-20 LAB — POCT INFLUENZA A/B
Influenza A, POC: NEGATIVE
Influenza B, POC: NEGATIVE

## 2023-01-20 MED ORDER — AMOXICILLIN-POT CLAVULANATE 875-125 MG PO TABS: 1.0000 | ORAL_TABLET | Freq: Two times a day (BID) | ORAL | 0 refills | Status: DC

## 2023-01-20 MED ORDER — PREDNISONE 20 MG PO TABS: 40.0000 mg | ORAL_TABLET | Freq: Every day | ORAL | 0 refills | Status: DC

## 2023-01-20 MED ORDER — BENZONATATE 100 MG PO CAPS: 100.0000 mg | ORAL_CAPSULE | Freq: Three times a day (TID) | ORAL | 1 refills | Status: DC | PRN

## 2023-01-20 NOTE — Patient Instructions (Addendum)
It was great to see you!  Start the antibiotic(s) for your cough/sinus infection Start the prednisone for your cough Use tessalon perles as needed during the day for cough (keep away from children - can be toxic!). If tessalon perles ineffective, trial 12-hour delsym over the counter cough syrup (generic is fine!)   Keep [place] posted on all of your symptoms  Your testing for COVID and Flu today was negative

## 2023-01-20 NOTE — Progress Notes (Signed)
Jeff Graves is a 77 y.o. male here for a new problem.  History of Present Illness:   Chief Complaint  Patient presents with   Cough    Chest and nasal congestion yellow phlegm.   Shortness of Breath    With breathing due to congestion    HPI  Cough: Complains of cough with expectoration of yellow mucus, as well as chest and nasal congestion for the past week.  Endorses sore throat (initial symptom, last week, since resolved), SOB (attributed to congestion) with wheezing, and mild fatigue.  Reports he did try Mucinex for his symptoms with no relief.  Denies known sick contact, recent travel low appetite, or history of glaucoma.  Past Medical History:  Diagnosis Date   A-fib [facility])    Episode after surgery   Allergy    seasonal   Atrial tachycardia (HCC) 06/04/2012   12/13-QRS duration-102 ms 6/14-QRS duration-98 ms    CAD (coronary artery disease)    a. s/p CABG 11/2018   CKD (chronic kidney disease), stage II    DDD (degenerative disc disease)    Diverticulosis of colon    Hemorrhoids    History of kidney stones    Hypercholesterolemia    Hypertension    NSVT (nonsustained ventricular tachycardia) (HCC)    PAC (premature atrial contraction)    Pneumothorax    Postoperative atrial fibrillation (HCC)    Renal calculus    RENAL CALCULUS, HX OF 12/26/2006   Qualifier: Diagnosis of  By: Jerolyn Shin     S/P rotator cuff surgery 04/15/2014   Shoulder pain    Sinus bradycardia     Social History   Tobacco Use   Smoking status: Former    Current packs/day: 0.00    Average packs/day: 0.5 packs/day for 20.0 years (10.0 ttl pk-yrs)    Types: Cigarettes    Start date: 02/12/1964    Quit date: 02/12/1984    Years since quitting: 38.9   Smokeless tobacco: Never  Vaping Use   Vaping status: Never Used  Substance Use Topics   Alcohol use: No    Alcohol/week: 0.0 standard drinks of alcohol    Comment: social use   Drug use: No   Past Surgical History:  Procedure  Laterality Date   COLONOSCOPY     CORONARY ARTERY BYPASS GRAFT N/A 11/23/2018   Procedure: CORONARY ARTERY BYPASS GRAFTING (CABG), ON PUMP, TIMES FOUR, LIMA to LAD, SEQ SVG to PDA, PLVB, SVG to CIRCUMFLEX, USING LEFT INTERNAL MAMMARY ARTERY AND RIGHT GREATER SAPHENOUS VEIN HARVESTED ENDOSCOPICALLY AND LEFT GREATER SAPHENOUS VEIN;  Surgeon: Delight Ovens, MD;  Location: [facility] OR;  Service: Open Heart Surgery;  Laterality: N/A;   CYSTOSCOPY/URETEROSCOPY/HOLMIUM LASER/STENT PLACEMENT Left 08/22/2022   Procedure: CYSTOSCOPY/LEFT RETROGRADE PYELOGRAM/ LEFT URETEROSCOPY/HOLMIUM LASER/LEFT URETERAL STENT PLACEMENT;  Surgeon: Heloise Purpura, MD;  Location: [facility] ORS;  Service: Urology;  Laterality: Left;  45 MINUTES NEEDED FOR CASE   LEFT HEART CATH AND CORONARY ANGIOGRAPHY N/A 11/18/2018   Procedure: LEFT HEART CATH AND CORONARY ANGIOGRAPHY;  Surgeon: Kathleene Hazel, MD;  Location: [facility] INVASIVE CV LAB;  Service: Cardiovascular;  Laterality: N/A;   None     SHOULDER SURGERY Left 03/29/2014   TEE WITHOUT CARDIOVERSION N/A 11/23/2018   Procedure: TRANSESOPHAGEAL ECHOCARDIOGRAM (TEE);  Surgeon: Delight Ovens, MD;  Location: [facility] OR;  Service: Open Heart Surgery;  Laterality: N/A;   Family History  Problem Relation Age of Onset   Heart attack Father 24   Valvular heart disease  Mother 89   Lymphoma Sister        Deceased   Breast cancer Sister        Alive   Colon cancer Neg Hx    Esophageal cancer Neg Hx    Rectal cancer Neg Hx    Stomach cancer Neg Hx    No Known Allergies Current Medications:   Current Outpatient Medications:    amLODipine (NORVASC) 5 MG tablet, Take 1 tablet (5 mg total) by mouth daily., Disp: 90 tablet, Rfl: 3   aspirin EC 81 MG tablet, Take 81 mg by mouth daily. Swallow whole., Disp: , Rfl:    atorvastatin (LIPITOR) 80 MG tablet, TAKE ONE TABLET BY MOUTH DAILY (Patient taking differently: Take 40 mg by mouth every evening.), Disp: 90 tablet, Rfl: 3   Cholecalciferol  (VITAMIN D) 2000 units CAPS, Take 2,000 Units by mouth daily., Disp: , Rfl:    Cyanocobalamin (VITAMIN B-12) 5000 MCG LOZG, Take 5,000 mcg by mouth daily. , Disp: , Rfl:    loratadine (CLARITIN) 10 MG tablet, Take 10 mg by mouth daily as needed for allergies. , Disp: , Rfl:    metoprolol tartrate (LOPRESSOR) 25 MG tablet, TAKE ONE TABLET BY MOUTH TWICE A DAY, Disp: 180 tablet, Rfl: 1   Multiple Vitamins-Minerals (MULTIVITAMIN WITH MINERALS) tablet, Take 1 tablet by mouth daily., Disp: , Rfl:   Review of Systems:   ROS See pertinent positives and negatives as per the HPI.  Vitals:   Vitals:   01/20/23 1121  BP: 112/64  Pulse: 80  Temp: 97.9 F (36.6 C)  TempSrc: Temporal  SpO2: 96%  Weight: 152 lb 9.6 oz (69.2 kg)  Height: 5\' 10"  (1.778 m)     Body mass index is 21.9 kg/m.  Physical Exam:   Physical Exam Vitals and nursing note reviewed.  Constitutional:      General: He is not in acute distress.    Appearance: He is well-developed. He is not ill-appearing or toxic-appearing.  HENT:     Head: Normocephalic and atraumatic.     Right Ear: Tympanic membrane, ear canal and external ear normal. Tympanic membrane is not erythematous, retracted or bulging.     Left Ear: Tympanic membrane, ear canal and external ear normal. Tympanic membrane is not erythematous, retracted or bulging.     Nose: Nose normal.     Right Sinus: No maxillary sinus tenderness or frontal sinus tenderness.     Left Sinus: No maxillary sinus tenderness or frontal sinus tenderness.     Mouth/Throat:     Pharynx: Uvula midline. No posterior oropharyngeal erythema.  Eyes:     General: Lids are normal.     Conjunctiva/sclera: Conjunctivae normal.  Neck:     Trachea: Trachea normal.  Cardiovascular:     Rate and Rhythm: Normal rate and regular rhythm.     Pulses: Normal pulses.     Heart sounds: Normal heart sounds, S1 normal and S2 normal.  Pulmonary:     Effort: Pulmonary effort is normal. No accessory  muscle usage or prolonged expiration.     Breath sounds: Wheezing and rhonchi present. No decreased breath sounds or rales.  Lymphadenopathy:     Cervical: No cervical adenopathy.  Skin:    General: Skin is warm and dry.  Neurological:     Mental Status: He is alert.     GCS: GCS eye subscore is 4. GCS verbal subscore is 5. GCS motor subscore is 6.  Psychiatric:  Speech: Speech normal.        Behavior: Behavior normal. Behavior is cooperative.    Results for orders placed or performed in visit on 01/20/23  POC COVID-19 BinaxNow  Result Value Ref Range   SARS Coronavirus 2 Ag Negative Negative  POCT Influenza A/B  Result Value Ref Range   Influenza A, POC Negative Negative   Influenza B, POC Negative Negative    Assessment and Plan:   Cough, unspecified type No red flags on exam.   Will initiate prednisone and augmentin per orders.  I have also sent in tessalon Perles for cough Discussed taking medications as prescribed.  Reviewed return precautions including new or worsening fever, SOB, new or worsening cough or other concerns.  Push fluids and rest.  I recommend that patient follow-up if symptoms worsen or persist despite treatment x 7-10 days, sooner if needed.   Darra Lis as a scribe for [employer], PA.,have documented all relevant documentation on the behalf of Jarold Motto, PA,as directed by  Jarold Motto, PA while in the presence of Jarold Motto, [place].  I, Jarold Motto, [place], have reviewed all documentation for this visit. The documentation on 01/20/23 for the exam, diagnosis, procedures, and orders are all accurate and complete.  Jarold Motto, PA-C

## 2023-01-27 ENCOUNTER — Telehealth: Payer: Self-pay | Admitting: Family Medicine

## 2023-01-27 NOTE — Telephone Encounter (Signed)
Patient was seen by Jarold Motto on 12/9 and is almost done with abx. States he still has a lot of congestion and wanted to know if Lelon Mast wanted to send in more meds or not. Please Advise.

## 2023-01-27 NOTE — Telephone Encounter (Signed)
Please see message and advise 

## 2023-01-27 NOTE — Telephone Encounter (Signed)
Spoke to pt told him calling about his message. Lelon Mast wants to know if you are having any new symptom(s) of worsening infection -- fever, chills, poor appetite, shortness of breath? Pt said no. Are you having chest congestion -- start plain mucinex, if nasal congestion -- start astelin nasal spray. Pt said mainly having chest congestion. Told him okay then start plain Mucinex and if symptoms do not improve please let us know. Pt verbalized understanding.

## 2023-02-25 ENCOUNTER — Encounter: Payer: Self-pay | Admitting: Family Medicine

## 2023-02-25 ENCOUNTER — Ambulatory Visit: Payer: HMO | Admitting: Family Medicine

## 2023-02-25 VITALS — BP 124/75 | HR 57 | Temp 97.2°F | Ht 70.0 in | Wt 153.0 lb

## 2023-02-25 DIAGNOSIS — I10 Essential (primary) hypertension: Secondary | ICD-10-CM

## 2023-02-25 DIAGNOSIS — J329 Chronic sinusitis, unspecified: Secondary | ICD-10-CM

## 2023-02-25 MED ORDER — AZELASTINE HCL 0.1 % NA SOLN: 2.0000 | Freq: Two times a day (BID) | NASAL | 12 refills | Status: DC

## 2023-02-25 MED ORDER — LEVOFLOXACIN 500 MG PO TABS: 500.0000 mg | ORAL_TABLET | Freq: Every day | ORAL | 0 refills | Status: AC

## 2023-02-25 NOTE — Patient Instructions (Addendum)
 It was very nice to see you today!  I think you may have a sinus infection.  Please start the antibiotic and nasal spray.  I will also refer you to ENT.  Let us  know if not improving.  Return if symptoms worsen or fail to improve.   Take care, Dr Kennyth  PLEASE NOTE:  If you had any lab tests, please let us  know if you have not heard back within a few days. You may see your results on mychart before we have a chance to review them but we will give you a call once they are reviewed by us .   If we ordered any referrals today, please let us  know if you have not heard from their office within the next week.   If you had any urgent prescriptions sent in today, please check with the pharmacy within an hour of our visit to make sure the prescription was transmitted appropriately.   Please try these tips to maintain a healthy lifestyle:  Eat at least 3 REAL meals and 1-2 snacks per day.  Aim for no more than 5 hours between eating.  If you eat breakfast, please do so within one hour of getting up.   Each meal should contain half fruits/vegetables, one quarter protein, and one quarter carbs (no bigger than a computer mouse)  Cut down on sweet beverages. This includes juice, soda, and sweet tea.   Drink at least 1 glass of water with each meal and aim for at least 8 glasses per day  Exercise at least 150 minutes every week.

## 2023-02-25 NOTE — Progress Notes (Signed)
   Jeff Graves is a 78 y.o. male who presents today for an office visit.  Assessment/Plan:  New/Acute Problems: Sinusitis  He had some improvement but not complete resolution with recent course of Augmentin  and prednisone .  He has had intermittent issues with recurrent sinus infections for the last couple of years as well.  We will try 5-day course of Levaquin .  Also start Astelin  nasal spray.  Encouraged hydration.  Due to recurrence would be reasonable for him to see ENT at this point.  Will place referral today.  We discussed reasons to return to care and seek emergent care.  Chronic Problems Addressed Today: Essential hypertension Blood pressure at goal today on amlodipine  5 mg daily and metoprolol  tartrate 25 mg daily.     Subjective:  HPI:  See Assessment / plan for status of chronic conditions.  He is here today with cough and sinus congestion. This has been going on for a few months. He was seen here by a different provider about a month ago. He was started on Augmentin , prednisone , tessalon . Symptoms improved with this regimen however never fully resoloved. This has been a recurring issue for many years but seems to be worsening. Symptoms are worse at night. No fevers or chills. Tried claritin without much improvement.        Objective:  Physical Exam: BP 124/75   Pulse (!) 57   Temp (!) 97.2 F (36.2 C) (Temporal)   Ht 5' 10 (1.778 m)   Wt 153 lb (69.4 kg)   SpO2 97%   BMI 21.95 kg/m   Gen: No acute distress, resting comfortably HEENT: TMs clear.  OP erythematous.  Nasal mucosa erythematous and boggy bilaterally. CV: Regular rate and rhythm with no murmurs appreciated Pulm: Normal work of breathing, clear to auscultation bilaterally with no crackles, wheezes, or rhonchi Neuro: Grossly normal, moves all extremities Psych: Normal affect and thought content      Micharl Helmes M. Kennyth, MD 02/25/2023 12:35 PM

## 2023-02-25 NOTE — Assessment & Plan Note (Signed)
 Blood pressure at goal today on amlodipine 5 mg daily and metoprolol tartrate 25 mg daily.

## 2023-03-03 ENCOUNTER — Ambulatory Visit: Payer: HMO | Admitting: Family Medicine

## 2023-04-09 ENCOUNTER — Telehealth (INDEPENDENT_AMBULATORY_CARE_PROVIDER_SITE_OTHER): Payer: Self-pay | Admitting: Otolaryngology

## 2023-04-09 NOTE — Telephone Encounter (Signed)
 Reminder Call: Date: 04/10/2023 Status: Sch  Time: 2:00 PM [address] [place], [place] [address]  Left voicemail w/time and location

## 2023-04-10 ENCOUNTER — Ambulatory Visit (INDEPENDENT_AMBULATORY_CARE_PROVIDER_SITE_OTHER): Payer: HMO | Admitting: Otolaryngology

## 2023-04-10 ENCOUNTER — Telehealth: Payer: Self-pay | Admitting: Family Medicine

## 2023-04-10 VITALS — BP 143/80 | HR 60 | Ht 70.0 in | Wt 153.0 lb

## 2023-04-10 DIAGNOSIS — J310 Chronic rhinitis: Secondary | ICD-10-CM | POA: Diagnosis not present

## 2023-04-10 DIAGNOSIS — R0982 Postnasal drip: Secondary | ICD-10-CM | POA: Diagnosis not present

## 2023-04-10 DIAGNOSIS — J342 Deviated nasal septum: Secondary | ICD-10-CM | POA: Diagnosis not present

## 2023-04-10 DIAGNOSIS — R0981 Nasal congestion: Secondary | ICD-10-CM

## 2023-04-10 DIAGNOSIS — J343 Hypertrophy of nasal turbinates: Secondary | ICD-10-CM

## 2023-04-10 NOTE — Telephone Encounter (Signed)
 See note

## 2023-04-10 NOTE — Telephone Encounter (Unsigned)
 Copied from CRM [id number]. Topic: Referral - Request for Referral >> Apr 10, 2023  2:32 PM Jeff Graves wrote: Did the patient discuss referral with their provider in the last year? No (If No - schedule appointment) (If Yes - send message)  Appointment offered? Yes  Type of order/referral and detailed reason for visit: Patient was referred to ENT by PCP Jeff Graves, with the appointment held today. Patient reports that since the last office visit, a skin issue has developed, which he suspects could be an allergy but is unsure. This has been ongoing for 3 weeks. He wanted to wait to reach out after the ENT appointment, as he was confused about whether the issues were related. The ENT advised him to get a dermatology referral. The patient has a rash on his chest and scalp, which is not raised but appears as red splotches. The rash is severe enough to cause difficulty sleeping due to intense itching  Preference of office, provider, location: no preference   If referral order, have you been seen by this specialty before? No (If Yes, this issue or another issue? When? Where?  Can we respond through MyChart? No

## 2023-04-11 NOTE — Telephone Encounter (Signed)
 Recommend he come in here for a visit.  Katina Degree. Jimmey Ralph, MD 04/11/2023 7:31 AM

## 2023-04-11 NOTE — Telephone Encounter (Signed)
 Please schedule an office visit for ENT referral per PCP

## 2023-04-12 DIAGNOSIS — J310 Chronic rhinitis: Secondary | ICD-10-CM | POA: Insufficient documentation

## 2023-04-12 DIAGNOSIS — J343 Hypertrophy of nasal turbinates: Secondary | ICD-10-CM | POA: Insufficient documentation

## 2023-04-12 DIAGNOSIS — J342 Deviated nasal septum: Secondary | ICD-10-CM | POA: Insufficient documentation

## 2023-04-12 NOTE — Progress Notes (Signed)
 Patient ID: Jeff Graves, male   DOB: [birth year], 78 y.o.   MRN: [id number]  CC: Chronic nasal obstruction, nasal drainage, chronic cough  HPI:  LEONIDUS ROWAND is a 78 y.o. male who presents today complaining of chronic nasal obstruction for the past 3 months.  He has also noted nasal drainage and chronic cough.  He was treated with 2 courses of antibiotics.  Despite of treatment, he continues to be symptomatic.  He has a history of environmental allergies.  He is currently on Claritin.  He has no previous ENT surgery.  Past Medical History:  Diagnosis Date   A-fib [facility])    Episode after surgery   Allergy    seasonal   Atrial tachycardia (HCC) 06/04/2012   12/13-QRS duration-102 ms 6/14-QRS duration-98 ms    CAD (coronary artery disease)    a. s/p CABG 11/2018   CKD (chronic kidney disease), stage II    DDD (degenerative disc disease)    Diverticulosis of colon    Hemorrhoids    History of kidney stones    Hypercholesterolemia    Hypertension    NSVT (nonsustained ventricular tachycardia) (HCC)    PAC (premature atrial contraction)    Pneumothorax    Postoperative atrial fibrillation (HCC)    Renal calculus    RENAL CALCULUS, HX OF 12/26/2006   Qualifier: Diagnosis of  By: Jerolyn Shin     S/P rotator cuff surgery 04/15/2014   Shoulder pain    Sinus bradycardia     Past Surgical History:  Procedure Laterality Date   COLONOSCOPY     CORONARY ARTERY BYPASS GRAFT N/A 11/23/2018   Procedure: CORONARY ARTERY BYPASS GRAFTING (CABG), ON PUMP, TIMES FOUR, LIMA to LAD, SEQ SVG to PDA, PLVB, SVG to CIRCUMFLEX, USING LEFT INTERNAL MAMMARY ARTERY AND RIGHT GREATER SAPHENOUS VEIN HARVESTED ENDOSCOPICALLY AND LEFT GREATER SAPHENOUS VEIN;  Surgeon: Delight Ovens, MD;  Location: [facility] OR;  Service: Open Heart Surgery;  Laterality: N/A;   CYSTOSCOPY/URETEROSCOPY/HOLMIUM LASER/STENT PLACEMENT Left 08/22/2022   Procedure: CYSTOSCOPY/LEFT RETROGRADE PYELOGRAM/ LEFT URETEROSCOPY/HOLMIUM  LASER/LEFT URETERAL STENT PLACEMENT;  Surgeon: Heloise Purpura, MD;  Location: [facility] ORS;  Service: Urology;  Laterality: Left;  45 MINUTES NEEDED FOR CASE   LEFT HEART CATH AND CORONARY ANGIOGRAPHY N/A 11/18/2018   Procedure: LEFT HEART CATH AND CORONARY ANGIOGRAPHY;  Surgeon: Kathleene Hazel, MD;  Location: [facility] INVASIVE CV LAB;  Service: Cardiovascular;  Laterality: N/A;   None     SHOULDER SURGERY Left 03/29/2014   TEE WITHOUT CARDIOVERSION N/A 11/23/2018   Procedure: TRANSESOPHAGEAL ECHOCARDIOGRAM (TEE);  Surgeon: Delight Ovens, MD;  Location: [facility];  Service: Open Heart Surgery;  Laterality: N/A;    Family History  Problem Relation Age of Onset   Heart attack Father 47   Valvular heart disease Mother 54   Lymphoma Sister        Deceased   Breast cancer Sister        Alive   Colon cancer Neg Hx    Esophageal cancer Neg Hx    Rectal cancer Neg Hx    Stomach cancer Neg Hx     Social History:  reports that he quit smoking about 39 years ago. His smoking use included cigarettes. He started smoking about 59 years ago. He has a 10 pack-year smoking history. He has never used smokeless tobacco. He reports that he does not drink alcohol and does not use drugs.  Allergies: No Known Allergies  Prior to Admission medications  Medication Sig Start Date End Date Taking? Authorizing Provider  amLODipine (NORVASC) 5 MG tablet Take 1 tablet (5 mg total) by mouth daily. 06/21/22  Yes Duke Salvia, MD  aspirin EC 81 MG tablet Take 81 mg by mouth daily. Swallow whole.   Yes [provider]  atorvastatin (LIPITOR) 80 MG tablet TAKE ONE TABLET BY MOUTH DAILY Patient taking differently: Take 40 mg by mouth every evening. 05/24/22  Yes Kathleene Hazel, MD  Cholecalciferol (VITAMIN D) 2000 units CAPS Take 2,000 Units by mouth daily.   Yes [provider]  Cyanocobalamin (VITAMIN B-12) 5000 MCG LOZG Take 5,000 mcg by mouth daily.    Yes [provider]   metoprolol tartrate (LOPRESSOR) 25 MG tablet TAKE ONE TABLET BY MOUTH TWICE A DAY 08/19/22  Yes Kathleene Hazel, MD  Multiple Vitamins-Minerals (MULTIVITAMIN WITH MINERALS) tablet Take 1 tablet by mouth daily.   Yes [provider]  amoxicillin-clavulanate (AUGMENTIN) 875-125 MG tablet Take 1 tablet by mouth 2 (two) times daily. 01/20/23   Jarold Motto, PA  azelastine (ASTELIN) 0.1 % nasal spray Place 2 sprays into both nostrils 2 (two) times daily. 02/25/23   Ardith Dark, MD  benzonatate (TESSALON PERLES) 100 MG capsule Take 1 capsule (100 mg total) by mouth 3 (three) times daily as needed for cough. 01/20/23   Jarold Motto, PA  loratadine (CLARITIN) 10 MG tablet Take 10 mg by mouth daily as needed for allergies.  Patient not taking: Reported on 04/10/2023    [provider]  predniSONE (DELTASONE) 20 MG tablet Take 2 tablets (40 mg total) by mouth daily with breakfast. 01/20/23   Jarold Motto, PA    Blood pressure (!) 143/80, pulse 60, height 5\' 10"  (1.778 m), weight 153 lb (69.4 kg), SpO2 97%. Exam: General: Communicates without difficulty, well nourished, no acute distress. Head: Normocephalic, no evidence injury, no tenderness, facial buttresses intact without stepoff. Face/sinus: No tenderness to palpation and percussion. Facial movement is normal and symmetric. Eyes: PERRL, EOMI. No scleral icterus, conjunctivae clear. Neuro: CN II exam reveals vision grossly intact.  No nystagmus at any point of gaze. Ears: Auricles well formed without lesions.  Ear canals are intact without mass or lesion.  No erythema or edema is appreciated.  The TMs are intact without fluid. Nose: External evaluation reveals normal support and skin without lesions.  Dorsum is intact.  Anterior rhinoscopy reveals congested mucosa over anterior aspect of inferior turbinates and deviated septum.  No purulence noted. Oral:  Oral cavity and oropharynx are intact, symmetric, without erythema or  edema.  Mucosa is moist without lesions. Neck: Full range of motion without pain.  There is no significant lymphadenopathy.  No masses palpable.  Thyroid bed within normal limits to palpation.  Parotid glands and submandibular glands equal bilaterally without mass.  Trachea is midline. Neuro:  CN 2-12 grossly intact.   Procedure:  Flexible Nasal Endoscopy: Description: Risks, benefits, and alternatives of flexible endoscopy were explained to the patient.  Specific mention was made of the risk of throat numbness with difficulty swallowing, possible bleeding from the nose and mouth, and pain from the procedure.  The patient gave oral consent to proceed.  The flexible scope was inserted into the right nasal cavity.  Endoscopy of the interior nasal cavity, superior, inferior, and middle meatus was performed. The sphenoid-ethmoid recess was examined. Edematous mucosa was noted.  No polyp, mass, or lesion was appreciated.  Severe nasal septal deviation noted. Olfactory cleft was clear.  Nasopharynx was clear.  Turbinates were hypertrophied but without mass.  The procedure was repeated on the contralateral side with similar findings.  The patient tolerated the procedure well.   Assessment: 1.  Chronic rhinitis with nasal mucosal congestion, severe nasal septal deviation, and bilateral inferior turbinate hypertrophy. 2.  No polyps, mass, lesion, or purulent drainage is noted today. 3.  Chronic postnasal drainage.  Plan: 1.  The physical exam and nasal endoscopy findings are reviewed with the patient. 2.  Flonase nasal spray 2 sprays each nostril daily.  The importance of consistent daily use is discussed. 3.  Nasal saline irrigation. 4.  The patient will return for reevaluation in 6 weeks.  Saraiya Kozma W Princesa Willig 04/12/2023, 2:25 PM

## 2023-04-14 ENCOUNTER — Ambulatory Visit: Payer: HMO | Admitting: Family Medicine

## 2023-04-17 ENCOUNTER — Encounter: Payer: Self-pay | Admitting: Family Medicine

## 2023-04-17 ENCOUNTER — Ambulatory Visit: Admitting: Family Medicine

## 2023-04-17 VITALS — BP 110/84 | HR 59 | Temp 98.3°F | Ht 70.0 in | Wt 152.4 lb

## 2023-04-17 DIAGNOSIS — I4719 Other supraventricular tachycardia: Secondary | ICD-10-CM | POA: Diagnosis not present

## 2023-04-17 DIAGNOSIS — L299 Pruritus, unspecified: Secondary | ICD-10-CM | POA: Diagnosis not present

## 2023-04-17 DIAGNOSIS — I10 Essential (primary) hypertension: Secondary | ICD-10-CM

## 2023-04-17 DIAGNOSIS — L723 Sebaceous cyst: Secondary | ICD-10-CM

## 2023-04-17 NOTE — Patient Instructions (Addendum)
 It was very nice to see you today!  We will refer you to see the dermatologist.  Please try using Sarna to help with the itch.  You can also try to use Claritin or Zyrtec to help with itching as well.  No follow-ups on file.   Take care, Dr Jimmey Ralph  PLEASE NOTE:  If you had any lab tests, please let us know if you have not heard back within a few days. You may see your results on mychart before we have a chance to review them but we will give you a call once they are reviewed by [place].   If we ordered any referrals today, please let us know if you have not heard from their office within the next week.   If you had any urgent prescriptions sent in today, please check with the pharmacy within an hour of our visit to make sure the prescription was transmitted appropriately.   Please try these tips to maintain a healthy lifestyle:  Eat at least 3 REAL meals and 1-2 snacks per day.  Aim for no more than 5 hours between eating.  If you eat breakfast, please do so within one hour of getting up.   Each meal should contain half fruits/vegetables, one quarter protein, and one quarter carbs (no bigger than a computer mouse)  Cut down on sweet beverages. This includes juice, soda, and sweet tea.   Drink at least 1 glass of water with each meal and aim for at least 8 glasses per day  Exercise at least 150 minutes every week.

## 2023-04-17 NOTE — Progress Notes (Signed)
=(    Jeff Graves is a 78 y.o. male who presents today for an office visit.  Assessment/Plan:  New/Acute Problems: Pruritus Likely secondary to xerosis cutis.  No red flag signs or symptoms.  No obvious rash on exam today.  Recommended topical Sarna.  He can also use second-generation antihistamine such as Claritin or Zyrtec.  Will place referral to dermatology repression request.  Sebaceous Cyst Will refer to dermatology for excision.   Chronic Problems Addressed Today: Essential hypertension Blood pressure at goal on amlodipine 5 mg daily and Metropol tartrate 25 mg daily.  Atrial tachycardia Regular rate and rhythm today.  Follows with cardiology.  On metoprolol tartrate 12.5 mg twice daily.     Subjective:  HPI:  See assessment / plan for status of chronic conditions. Patient here with pruritus on scalp and chest. This has been going on for a few weeks. No treatments tried.  Occasionally red and itchy.  No obvious precipitating events.  No new soaps, detergents, or exposures.  Has a few cyst on his back that he would like to have removed.        Objective:  Physical Exam: BP 110/84   Pulse (!) 59   Temp 98.3 F (36.8 C) (Temporal)   Ht 5\' 10"  (1.778 m)   Wt 152 lb 6.4 oz (69.1 kg)   SpO2 96%   BMI 21.87 kg/m   Gen: No acute distress, resting comfortably CV: Regular rate and rhythm with no murmurs appreciated Pulm: Normal work of breathing, clear to auscultation bilaterally with no crackles, wheezes, or rhonchi Skin: Xerosis cutis noted around neck and scalp.  2 discrete sebaceous cyst noted on back. Neuro: Grossly normal, moves all extremities Psych: Normal affect and thought content      Ronee Ranganathan M. Jimmey Ralph, MD 04/17/2023 9:20 AM

## 2023-04-17 NOTE — Assessment & Plan Note (Signed)
 Blood pressure at goal on amlodipine 5 mg daily and Metropol tartrate 25 mg daily.

## 2023-04-17 NOTE — Assessment & Plan Note (Signed)
 Regular rate and rhythm today.  Follows with cardiology.  On metoprolol tartrate 12.5 mg twice daily.

## 2023-04-21 ENCOUNTER — Telehealth: Payer: Self-pay

## 2023-04-21 NOTE — Telephone Encounter (Signed)
 Copied from CRM [id number]. Topic: Referral - Question >> Apr 21, 2023  3:32 PM Elizebeth Brooking wrote: Reason for CRM: Patient called in regarding referral for dermatologist patient called and got scheduled for sooner availability isn't until Oct ,was wondering if Dr.Parker can find another dermatologist that may be sooner  Returned pt call and advised this is typical turn around time for New Pt appts with dermatology at this time. Advised to ask to be added to cancellation list at this office as well. Advised office cb number if needing to return my call.

## 2023-05-14 ENCOUNTER — Encounter (INDEPENDENT_AMBULATORY_CARE_PROVIDER_SITE_OTHER): Payer: Self-pay

## 2023-05-14 ENCOUNTER — Ambulatory Visit (INDEPENDENT_AMBULATORY_CARE_PROVIDER_SITE_OTHER): Payer: HMO

## 2023-05-14 VITALS — BP 157/80 | HR 53 | Ht 70.0 in | Wt 153.0 lb

## 2023-05-14 DIAGNOSIS — H6123 Impacted cerumen, bilateral: Secondary | ICD-10-CM | POA: Diagnosis not present

## 2023-05-14 DIAGNOSIS — R0982 Postnasal drip: Secondary | ICD-10-CM | POA: Diagnosis not present

## 2023-05-14 DIAGNOSIS — J342 Deviated nasal septum: Secondary | ICD-10-CM | POA: Diagnosis not present

## 2023-05-14 DIAGNOSIS — J343 Hypertrophy of nasal turbinates: Secondary | ICD-10-CM | POA: Diagnosis not present

## 2023-05-14 DIAGNOSIS — R0981 Nasal congestion: Secondary | ICD-10-CM

## 2023-05-14 DIAGNOSIS — J310 Chronic rhinitis: Secondary | ICD-10-CM

## 2023-05-14 NOTE — Progress Notes (Unsigned)
 Patient ID: Jeff Graves, male   DOB: Apr 06, 1945, 78 y.o.   MRN: [id number]  Follow-up: Chronic nasal obstruction, postnasal drainage New complaint: Hearing loss  HPI: The patient is a 78 year old male who returns today for his follow-up evaluation.  The patient was last seen in February 2025.  At that time, he was complaining of chronic nasal obstruction and chronic nasal drainage.  He was noted to have nasal mucosal congestion, nasal septal deviation, and bilateral inferior turbinate hypertrophy.  He was treated with Flonase and nasal saline irrigation.  The patient returns today reporting improvement in his nasal breathing and his nasal drainage.  He has noted only mild nasal congestion in the morning.  He denies any facial pain, fever, or visual change.  He has a new complaint today of bilateral hearing loss.  He has noted increasing hearing difficulty, especially in noisy environments.  He denies any otalgia or otorrhea.  Exam: General: Communicates without difficulty, well nourished, no acute distress. Head: Normocephalic, no evidence injury, no tenderness, facial buttresses intact without stepoff. Face/sinus: No tenderness to palpation and percussion. Facial movement is normal and symmetric. Eyes: PERRL, EOMI. No scleral icterus, conjunctivae clear. Neuro: CN II exam reveals vision grossly intact.  No nystagmus at any point of gaze. Ears: Auricles well formed without lesions.  Bilateral cerumen impaction.  Nose: External evaluation reveals normal support and skin without lesions.  Dorsum is intact.  Anterior rhinoscopy reveals congested mucosa over anterior aspect of inferior turbinates and deviated septum.  No purulence noted. Oral:  Oral cavity and oropharynx are intact, symmetric, without erythema or edema.  Mucosa is moist without lesions. Neck: Full range of motion without pain.  There is no significant lymphadenopathy.  No masses palpable.  Thyroid bed within normal limits to palpation.   Parotid glands and submandibular glands equal bilaterally without mass.  Trachea is midline. Neuro:  CN 2-12 grossly intact.   Procedure: Bilateral cerumen disimpaction Anesthesia: None Description: Under the operating microscope, the cerumen is carefully removed with a combination of cerumen currette, alligator forceps, and suction catheters.  After the cerumen is removed, the TMs are noted to be normal.  No mass, erythema, or lesions. The patient tolerated the procedure well.    Assessment: 1.  Chronic rhinitis with nasal mucosal congestion, nasal septal deviation, and bilateral inferior turbinate hypertrophy.  The severity of his nasal congestion has decreased. 2.  His chronic postnasal drainage has also improved. 3.  Bilateral cerumen impaction.  After the disimpaction procedure, both tympanic membranes and middle ear spaces are normal. 4.  Possible hearing loss.  Plan: 1.  Otomicroscopy with bilateral cerumen disimpaction. 2.  The physical exam findings are reviewed with the patient. 3.  Continue with Flonase nasal spray and nasal saline irrigation as needed. 4.  The patient will return for reevaluation in 2 months.  We will obtain an audiogram at that time.  No audiologist is available today.

## 2023-05-16 DIAGNOSIS — H6123 Impacted cerumen, bilateral: Secondary | ICD-10-CM | POA: Insufficient documentation

## 2023-06-03 DIAGNOSIS — L728 Other follicular cysts of the skin and subcutaneous tissue: Secondary | ICD-10-CM | POA: Diagnosis not present

## 2023-06-03 DIAGNOSIS — L2089 Other atopic dermatitis: Secondary | ICD-10-CM | POA: Diagnosis not present

## 2023-06-03 DIAGNOSIS — L853 Xerosis cutis: Secondary | ICD-10-CM | POA: Diagnosis not present

## 2023-07-22 ENCOUNTER — Ambulatory Visit (INDEPENDENT_AMBULATORY_CARE_PROVIDER_SITE_OTHER): Admitting: Audiology

## 2023-07-22 ENCOUNTER — Encounter (INDEPENDENT_AMBULATORY_CARE_PROVIDER_SITE_OTHER): Payer: Self-pay | Admitting: Otolaryngology

## 2023-07-22 ENCOUNTER — Ambulatory Visit (INDEPENDENT_AMBULATORY_CARE_PROVIDER_SITE_OTHER): Admitting: Otolaryngology

## 2023-07-22 ENCOUNTER — Telehealth (INDEPENDENT_AMBULATORY_CARE_PROVIDER_SITE_OTHER): Payer: Self-pay | Admitting: Otolaryngology

## 2023-07-22 VITALS — BP 131/76 | HR 60 | Ht 70.0 in | Wt 150.0 lb

## 2023-07-22 DIAGNOSIS — J310 Chronic rhinitis: Secondary | ICD-10-CM | POA: Diagnosis not present

## 2023-07-22 DIAGNOSIS — J343 Hypertrophy of nasal turbinates: Secondary | ICD-10-CM

## 2023-07-22 DIAGNOSIS — J342 Deviated nasal septum: Secondary | ICD-10-CM | POA: Diagnosis not present

## 2023-07-22 DIAGNOSIS — H6123 Impacted cerumen, bilateral: Secondary | ICD-10-CM | POA: Diagnosis not present

## 2023-07-22 DIAGNOSIS — R0981 Nasal congestion: Secondary | ICD-10-CM

## 2023-07-22 MED ORDER — PREDNISONE 10 MG (21) PO TBPK: ORAL_TABLET | ORAL | 0 refills | Status: DC

## 2023-07-22 NOTE — Telephone Encounter (Signed)
 07/22/23 Patient called to cancel audio and only wanted to see Dr. Darlin Ehrlich at appointment. Made CMA aware of patient cancelling appt.

## 2023-07-24 NOTE — Progress Notes (Signed)
 Patient ID: Jeff Graves, male   DOB: Oct 12, 1945, 78 y.o.   MRN: [id number]  Follow-up: Chronic nasal congestion, postnasal drainage  HPI: The patient is a 78 year old male who returns today for his follow-up evaluation.  The patient was previously seen for chronic nasal congestion and postnasal drainage.  He was noted to have nasal mucosal congestion, nasal septal deviation, and bilateral inferior turbinate hypertrophy.  He was treated with Flonase nasal spray and nasal saline irrigation.  The patient returns today reporting that he was doing well until last week, when he started experiencing increasing congestion.  He denies any facial pain, fever, or visual change.  His nasal drainage has improved with the medical treatment.  Exam: General: Communicates without difficulty, well nourished, no acute distress. Head: Normocephalic, no evidence injury, no tenderness, facial buttresses intact without stepoff. Face/sinus: No tenderness to palpation and percussion. Facial movement is normal and symmetric. Eyes: PERRL, EOMI. No scleral icterus, conjunctivae clear. Neuro: CN II exam reveals vision grossly intact.  No nystagmus at any point of gaze. Ears: Auricles well formed without lesions.  Bilateral cerumen impaction.  Nose: External evaluation reveals normal support and skin without lesions.  Dorsum is intact.  Anterior rhinoscopy reveals congested mucosa over anterior aspect of inferior turbinates and deviated septum.  No purulence noted. Oral:  Oral cavity and oropharynx are intact, symmetric, without erythema or edema.  Mucosa is moist without lesions. Neck: Full range of motion without pain.  There is no significant lymphadenopathy.  No masses palpable.  Thyroid  bed within normal limits to palpation.  Parotid glands and submandibular glands equal bilaterally without mass.  Trachea is midline. Neuro:  CN 2-12 grossly intact.    Procedure: Bilateral cerumen disimpaction Anesthesia: None Description:  Under the operating microscope, the cerumen is carefully removed with a combination of cerumen currette, alligator forceps, and suction catheters.  After the cerumen is removed, the TMs are noted to be normal.  No mass, erythema, or lesions. The patient tolerated the procedure well.     Assessment: 1.  Chronic rhinitis with nasal mucosal congestion, nasal septal deviation, and bilateral inferior turbinate hypertrophy. 2.  Incidental finding of bilateral cerumen impaction.  After the cerumen removal procedure, both tympanic membranes and middle ear spaces are noted to be normal. 3.  No acute infection is noted today.  Plan: 1.  Otomicroscopy with bilateral cerumen disimpaction. 2.  The physical exam findings are reviewed with the patient.  He is reassured that no infection is noted today. 3.  Continue with Flonase nasal spray and nasal saline irrigation daily. 4.  The patient will return for reevaluation in 3 to 4 months.

## 2023-07-29 ENCOUNTER — Telehealth (INDEPENDENT_AMBULATORY_CARE_PROVIDER_SITE_OTHER): Payer: Self-pay | Admitting: Otolaryngology

## 2023-07-29 NOTE — Telephone Encounter (Signed)
 Patient called in and stated he has finished all meds and still does not feel better / different.  Does something else need to be called in or does patient need to be seen?  Please advise.  Thank you.

## 2023-08-07 ENCOUNTER — Encounter: Payer: Self-pay | Admitting: Family Medicine

## 2023-08-07 ENCOUNTER — Ambulatory Visit (INDEPENDENT_AMBULATORY_CARE_PROVIDER_SITE_OTHER): Admitting: Family Medicine

## 2023-08-07 VITALS — BP 94/56 | HR 69 | Temp 98.1°F | Ht 70.0 in | Wt 151.2 lb

## 2023-08-07 DIAGNOSIS — I4719 Other supraventricular tachycardia: Secondary | ICD-10-CM | POA: Diagnosis not present

## 2023-08-07 DIAGNOSIS — J029 Acute pharyngitis, unspecified: Secondary | ICD-10-CM | POA: Diagnosis not present

## 2023-08-07 DIAGNOSIS — R739 Hyperglycemia, unspecified: Secondary | ICD-10-CM | POA: Diagnosis not present

## 2023-08-07 DIAGNOSIS — E785 Hyperlipidemia, unspecified: Secondary | ICD-10-CM

## 2023-08-07 DIAGNOSIS — I10 Essential (primary) hypertension: Secondary | ICD-10-CM | POA: Diagnosis not present

## 2023-08-07 LAB — POCT RAPID STREP A (OFFICE): Rapid Strep A Screen: POSITIVE — AB

## 2023-08-07 MED ORDER — AMOXICILLIN 500 MG PO CAPS: 500.0000 mg | ORAL_CAPSULE | Freq: Three times a day (TID) | ORAL | 0 refills | Status: AC

## 2023-08-07 NOTE — Patient Instructions (Signed)
 It was very nice to see you today!  Your strep test is positive.  Please start the amoxicillin .  Make sure that you are getting plenty of fluids.  Let us  know if not improving in the next few days.  Return if symptoms worsen or fail to improve.   Take care, Dr Kennyth  PLEASE NOTE:  If you had any lab tests, please let us  know if you have not heard back within a few days. You may see your results on mychart before we have a chance to review them but we will give you a call once they are reviewed by us .   If we ordered any referrals today, please let us  know if you have not heard from their office within the next week.   If you had any urgent prescriptions sent in today, please check with the pharmacy within an hour of our visit to make sure the prescription was transmitted appropriately.   Please try these tips to maintain a healthy lifestyle:  Eat at least 3 REAL meals and 1-2 snacks per day.  Aim for no more than 5 hours between eating.  If you eat breakfast, please do so within one hour of getting up.   Each meal should contain half fruits/vegetables, one quarter protein, and one quarter carbs (no bigger than a computer mouse)  Cut down on sweet beverages. This includes juice, soda, and sweet tea.   Drink at least 1 glass of water with each meal and aim for at least 8 glasses per day  Exercise at least 150 minutes every week.

## 2023-08-07 NOTE — Assessment & Plan Note (Signed)
 Controlled today on amlodipine  5 mg daily and metoprolol  tartrate 25 mg daily.

## 2023-08-07 NOTE — Assessment & Plan Note (Signed)
 Regular rate and rhythm today.  Follows with cardiology.  On metoprolol  tartrate 12.5 mg daily.

## 2023-08-07 NOTE — Assessment & Plan Note (Signed)
 Doing well with Lipitor  80 mg daily.  Will check lipids when he comes back in for physical later this year.

## 2023-08-07 NOTE — Assessment & Plan Note (Signed)
 Check A1c when he comes back in for annual physical later this year.

## 2023-08-07 NOTE — Progress Notes (Signed)
   Jeff Graves is a 78 y.o. male who presents today for an office visit.  Assessment/Plan:  New/Acute Problems: Sore Throat Rapid strep positive.  No red flags.  Start amoxicillin .  He can use over-the-counter meds as needed as well.  Encouraged hydration.  We discussed reasons to return to care.  Follow-up as needed.  Chronic Problems Addressed Today: Essential hypertension Controlled today on amlodipine  5 mg daily and metoprolol  tartrate 25 mg daily.  Atrial tachycardia Regular rate and rhythm today.  Follows with cardiology.  On metoprolol  tartrate 12.5 mg daily.  Hyperglycemia Check A1c when he comes back in for annual physical later this year.  Dyslipidemia Doing well with Lipitor  80 mg daily.  Will check lipids when he comes back in for physical later this year.     Subjective:  HPI:  See Assessment / plan for status of chronic conditions.  Patient is here today with sore throat for the last couple of days.  He is having some cough and congestion as well. Tried OTC medications with some improvement. No fevers or chills. No sick contacts.        Objective:  Physical Exam: BP (!) 94/56   Pulse 69   Temp 98.1 F (36.7 C) (Temporal)   Ht 5' 10 (1.778 m)   Wt 151 lb 3.2 oz (68.6 kg)   SpO2 99%   BMI 21.69 kg/m   Gen: No acute distress, resting comfortably HEENT: TMs clear.  OP erythematous.  No appreciated lymphadenopathy. CV: Regular rate and rhythm with no murmurs appreciated Pulm: Normal work of breathing, clear to auscultation bilaterally with no crackles, wheezes, or rhonchi Neuro: Grossly normal, moves all extremities Psych: Normal affect and thought content      Jeff Graves M. Kennyth, MD 08/07/2023 2:54 PM

## 2023-08-14 ENCOUNTER — Other Ambulatory Visit: Payer: Self-pay

## 2023-08-14 MED ORDER — METOPROLOL TARTRATE 25 MG PO TABS: 25.0000 mg | ORAL_TABLET | Freq: Two times a day (BID) | ORAL | 0 refills | Status: AC

## 2023-08-19 ENCOUNTER — Encounter (INDEPENDENT_AMBULATORY_CARE_PROVIDER_SITE_OTHER): Payer: Self-pay | Admitting: Otolaryngology

## 2023-08-19 ENCOUNTER — Ambulatory Visit (INDEPENDENT_AMBULATORY_CARE_PROVIDER_SITE_OTHER): Admitting: Otolaryngology

## 2023-08-19 VITALS — BP 104/67 | HR 62 | Ht 69.0 in | Wt 150.0 lb

## 2023-08-19 DIAGNOSIS — J343 Hypertrophy of nasal turbinates: Secondary | ICD-10-CM | POA: Diagnosis not present

## 2023-08-19 DIAGNOSIS — J342 Deviated nasal septum: Secondary | ICD-10-CM | POA: Diagnosis not present

## 2023-08-19 DIAGNOSIS — J310 Chronic rhinitis: Secondary | ICD-10-CM

## 2023-08-19 DIAGNOSIS — R0981 Nasal congestion: Secondary | ICD-10-CM

## 2023-08-20 NOTE — Progress Notes (Signed)
 Patient ID: Jeff Graves, male   DOB: 1945-02-14, 78 y.o.   MRN: [id number]  Follow-up: Chronic nasal obstruction, postnasal drainage  HPI: The patient is a 78 year old male who returns today for his follow-up evaluation.  He was last seen in June 2025.  At that time, he was noted to have nasal mucosal congestion, nasal septal deviation, and bilateral inferior turbinate hypertrophy.  He was treated with Flonase nasal spray and nasal saline irrigation.  The patient returns today complaining of increasing nasal congestion for the past month.  He has been symptomatic despite the medical treatment.  He denies any facial pain, fever, or visual change.  Exam: General: Communicates without difficulty, well nourished, no acute distress. Head: Normocephalic, no evidence injury, no tenderness, facial buttresses intact without stepoff. Face/sinus: No tenderness to palpation and percussion. Facial movement is normal and symmetric. Eyes: PERRL, EOMI. No scleral icterus, conjunctivae clear. Neuro: CN II exam reveals vision grossly intact.  No nystagmus at any point of gaze. Ears: Auricles well formed without lesions.  Ear canals are intact without mass or lesion.  No erythema or edema is appreciated.  The TMs are intact without fluid. Nose: External evaluation reveals normal support and skin without lesions.  Dorsum is intact.  Anterior rhinoscopy reveals congested mucosa over anterior aspect of inferior turbinates and deviated septum.  No purulence noted. Oral:  Oral cavity and oropharynx are intact, symmetric, without erythema or edema.  Mucosa is moist without lesions. Neck: Full range of motion without pain.  There is no significant lymphadenopathy.  No masses palpable.  Thyroid  bed within normal limits to palpation.  Parotid glands and submandibular glands equal bilaterally without mass.  Trachea is midline. Neuro:  CN 2-12 grossly intact.   Assessment: 1.  Chronic rhinitis with nasal mucosal congestion, nasal  septal deviation, and bilateral inferior turbinate hypertrophy.  More than 95% of his nasal passageways are obstructed bilaterally. 2.  No polyps, mass, lesion, or purulent drainage is noted today.  Plan: 1.  The physical exam findings are reviewed with the patient. 2.  Continue with Flonase nasal spray and nasal saline irrigation daily. 3.  In light of his persistent symptoms, he may benefit from surgical intervention with septoplasty and bilateral inferior turbinate reduction.  The risk, benefits, alternatives, and details of the procedures are extensively discussed.  Questions are invited and answered. 4.  The patient would like to consider his options.  He is encouraged to call with any questions or concerns.

## 2023-09-05 ENCOUNTER — Ambulatory Visit: Admitting: Internal Medicine

## 2023-09-15 NOTE — Progress Notes (Signed)
 Electrophysiology Office Note:   Date:  09/16/2023  ID:  DUBLIN GRAYER, DOB [birth year], MRN [id number]  Primary Cardiologist: Lonni Cash, MD Electrophysiologist: Fonda Kitty, MD      History of Present Illness:   Jeff Graves is a 78 y.o. male with h/o atrial tachycardia previously on flecainide , coronary artery disease status post CABG, hypertension who is being seen today for routine follow-up.  Discussed the use of AI scribe software for clinical note transcription with the patient, who gave verbal consent to proceed.  History of Present Illness He has not experienced recent episodes of palpitations, heart racing, or skipping beats. He is currently on metoprolol  12.5 mg twice daily, which he tolerates well. His heart rate increases to 85-90 bpm during exercise without any issues. He remains active, attending the gym regularly, and is able to perform all activities without difficulty. No recent episodes of dizziness, lightheadedness, or near-syncope within the past year.  He is doing well.  He has no new or acute complaints today.   Review of systems complete and found to be negative unless listed in HPI.   EP Information / Studies Reviewed:    EKG is ordered today. Personal review as below.  EKG Interpretation Date/Time:  Tuesday September 16 2023 09:16:57 EDT Ventricular Rate:  45 PR Interval:  180 QRS Duration:  92 QT Interval:  434 QTC Calculation: 375 R Axis:   -2  Text Interpretation: Sinus bradycardia When compared with ECG of 24-Nov-2018 07:34, Premature atrial complexes are no longer Present Vent. rate has decreased BY  35 BPM Nonspecific T wave abnormality no longer evident in Inferior leads Confirmed by Kitty Fonda [phone]) on 09/16/2023 9:20:37 AM   Nuclear Stress 06/25/22:   The study is normal. The study is low risk.   Patient exercised according to the BRUCE protocol for 10:13min achieving 10.1 METs   Target HR was achieved (max HR 133bpm; 91%  MPHR)   No ST deviation was noted.   LV perfusion is normal. There is no evidence of ischemia. There is no evidence of infarction.   Left ventricular function is normal. Nuclear stress EF: 55 %. The left ventricular ejection fraction is normal (55-65%). End diastolic cavity size is normal. End systolic cavity size is normal.   Prior study available for comparison from 12/13/2014. No ischemia visualized on current study.  Zio 05/2021:  Patch Wear Time:  14 days and 0 hours (2023-03-29T11:29:39-0400 to 2023-04-12T11:29:43-0400)   Sinus rhythm. (min HR of 42 bpm, max HR of 193 bpm).  1 run of Ventricular Tachycardia occurred lasting 4 beats 56 Supraventricular Tachycardia runs occurred, the run with the fastest interval lasting 4 beats with a max rate of 193 bpm, the longest lasting 10.2 secs with an avg rate of 124 bpm. Isolated SVEs were occasional (2.5%, 31503), SVE Couplets were rare (<1.0%, 919), and SVE Triplets  were rare (<1.0%, 200). Isolated VEs were occasional (1.5%, 19127), VE Couplets were rare (<1.0%, 280), and VE Triplets were rare (<1.0%, 10). Ventricular Bigeminy was present.   Echo 05/2021:  1. Left ventricular ejection fraction, by estimation, is 55 to 60%. The  left ventricle has normal function. The left ventricle has no regional  wall motion abnormalities. There is mild left ventricular hypertrophy.  Left ventricular diastolic parameters  are indeterminate.   2. Right ventricular systolic function is normal. The right ventricular  size is mildly enlarged.   3. Left atrial size was moderately dilated.   4. Right atrial size was mildly  dilated.   5. The mitral valve is normal in structure. Trivial mitral valve  regurgitation. No evidence of mitral stenosis.   6. The aortic valve is tricuspid. Aortic valve regurgitation is mild. No  aortic stenosis is present.   7. Aortic dilatation noted. There is dilatation of the ascending aorta,  measuring 40 mm.      Physical Exam:    VS:  BP (!) 154/81   Pulse (!) 50   Ht 5' 10 (1.778 m)   Wt 154 lb (69.9 kg)   SpO2 99%   BMI 22.10 kg/m    Wt Readings from Last 3 Encounters:  09/16/23 154 lb (69.9 kg)  08/19/23 150 lb (68 kg)  08/07/23 151 lb 3.2 oz (68.6 kg)     GEN: Well nourished, well developed in no acute distress NECK: No JVD CARDIAC: Bradycardic, regular.  No murmurs. RESPIRATORY:  Clear to auscultation without rales, wheezing or rhonchi  ABDOMEN: Soft, non-distended EXTREMITIES:  No edema; No deformity   ASSESSMENT AND PLAN:    #. Atrial tachycardia: No known recurrence.  Denies palpitations.  Doing well. -Continue metoprolol  12.5mg  BID.   #. Asymptomatic sinus bradycardia: Patient reports that he is able to get his heart rate up into the 90s while exercising at the gym.  He has no symptoms with this. - Continue to monitor for development of symptoms.  Precautions /warning signs provided.  Will continue low-dose metoprolol  for now for AT suppression, but can discontinue in the future if bradycardia worsens or he develops symptoms.  #. Hypertension: -Above goal today.  Recommend checking blood pressures 1-2 times per week at home and recording the values.  Recommend bringing these recordings to the primary care physician.  #. CAD s/p CABG:  -Continue aspirin  81mg  daily and atorvastatin  40mg  daily. Follows with Dr. Verlin.   Follow up with general cardiology.  I see no current EP needs that would warrant follow-up.  Signed, Fonda Kitty, MD

## 2023-09-16 ENCOUNTER — Encounter: Payer: Self-pay | Admitting: Cardiology

## 2023-09-16 ENCOUNTER — Ambulatory Visit: Attending: Cardiovascular Disease | Admitting: Cardiology

## 2023-09-16 VITALS — BP 154/81 | HR 50 | Ht 70.0 in | Wt 154.0 lb

## 2023-09-16 DIAGNOSIS — R001 Bradycardia, unspecified: Secondary | ICD-10-CM | POA: Diagnosis not present

## 2023-09-16 DIAGNOSIS — I10 Essential (primary) hypertension: Secondary | ICD-10-CM

## 2023-09-16 DIAGNOSIS — I4719 Other supraventricular tachycardia: Secondary | ICD-10-CM | POA: Diagnosis not present

## 2023-09-16 DIAGNOSIS — Z951 Presence of aortocoronary bypass graft: Secondary | ICD-10-CM | POA: Diagnosis not present

## 2023-09-16 NOTE — Patient Instructions (Signed)
 Medication Instructions:  Your physician recommends that you continue on your current medications as directed. Please refer to the Current Medication list given to you today.  *If you need a refill on your cardiac medications before your next appointment, please call your pharmacy*  Follow-Up: At [facility], you and your health needs are our priority.  As part of our continuing mission to provide you with exceptional heart care, our providers are all part of one team.  This team includes your primary Cardiologist (physician) and Advanced Practice Providers or APPs (Physician Assistants and Nurse Practitioners) who all work together to provide you with the care you need, when you need it.  Your next appointment:   Follow up with Dr. Kennyth as needed.  Keep follow up with Dr. Verlin

## 2023-09-18 ENCOUNTER — Other Ambulatory Visit (INDEPENDENT_AMBULATORY_CARE_PROVIDER_SITE_OTHER): Payer: Self-pay | Admitting: Otolaryngology

## 2023-09-18 DIAGNOSIS — J342 Deviated nasal septum: Secondary | ICD-10-CM

## 2023-09-18 DIAGNOSIS — J343 Hypertrophy of nasal turbinates: Secondary | ICD-10-CM

## 2023-10-01 ENCOUNTER — Telehealth (INDEPENDENT_AMBULATORY_CARE_PROVIDER_SITE_OTHER): Payer: Self-pay | Admitting: Otolaryngology

## 2023-10-01 NOTE — Telephone Encounter (Signed)
 The patient called in concerned as he wakes up every morning very congested.  He is aware that is part of the reason he is having surgery, but he wants to see if he should be taking a medication to curb this issue before his surgery so he is able to have his procedure done as scheduled.  He let me know this has been a long term issue for him and he wants to check if there is something he needs to be doing in preparation for his surgery.

## 2023-10-06 ENCOUNTER — Encounter: Payer: Self-pay | Admitting: Pharmacist

## 2023-10-06 NOTE — Progress Notes (Signed)
 Pharmacy Quality Measure Review  This patient is appearing on a report for being at risk of failing the adherence measure for cholesterol (statin) medications this calendar year.   Medication: atorvastatin  80mg   Last fill date: 05/20/2023 for 90 day supply   However cardiology office noted patient is only taking 40mg  daily - patient preference. This was noted at recent appt 09/16/2023 and also during appointment 11/15/2022.   Lab Results  Component Value Date   CHOL 126 04/02/2022   HDL 73 04/02/2022   LDLCALC 43 04/02/2022   TRIG 41 04/02/2022   CHOLHDL 1.7 04/02/2022    Assessment - Patient's LDL is at goal with taking atorvastatin  40mg  daily instead of as prescribed 80mg  daily and cardiologist is aware he is taking 40mg  daily.   Plan Placed note in next cardio office visit appointment to consider updated Rx to reflect how patient is actually taking atorvasatin 40mg  daily. His copay is $0 so no financial benefit to halving tablets. HTA allows up to a 100 day supply per fill.  Unfortunately patient will likely fail MAC for 2025 because he will not need to refill atorvastatin  based on current dose until October 2025 which will be past allowable refillable date of 10/11/2023  Madelin Ray, PharmD Clinical Pharmacist [facility] Primary [facility]  Population Health [phone]

## 2023-10-16 DIAGNOSIS — H02403 Unspecified ptosis of bilateral eyelids: Secondary | ICD-10-CM | POA: Diagnosis not present

## 2023-10-18 ENCOUNTER — Other Ambulatory Visit: Payer: Self-pay | Admitting: Cardiovascular Disease

## 2023-10-18 ENCOUNTER — Other Ambulatory Visit: Payer: Self-pay | Admitting: Internal Medicine

## 2023-10-21 ENCOUNTER — Other Ambulatory Visit: Payer: Self-pay

## 2023-10-21 ENCOUNTER — Encounter (HOSPITAL_BASED_OUTPATIENT_CLINIC_OR_DEPARTMENT_OTHER): Payer: Self-pay | Admitting: Otolaryngology

## 2023-10-21 NOTE — Progress Notes (Signed)
   10/21/23 1518  PAT Phone Screen  Is the patient taking a GLP-1 receptor agonist? No  Do You Have Diabetes? No  Do You Have Hypertension? Yes  Have You Ever Been to the ER for Asthma? No  Have You Taken Oral Steroids in the Past 3 Months? No  Do you Take Phenteramine or any Other Diet Drugs? No  Recent  Lab Work, EKG, CXR? Yes  Where was this test performed? 09-16-23 EKG  Do you have a history of heart problems? (S)  Yes (CAD-CABGx4 2020)  Cardiologist Name Dr Verlin  Have you ever had tests on your heart? Yes  What cardiac tests were performed? Echo;Stress Test  What date/year were cardiac tests completed? 05-2021 ECHO EF 55-60%, 06-25-22 NM stress test-low risk EF 55%  Results viewable: [facility] Media Tab  Any Recent Hospitalizations? No  Height 5' 10 (1.778 m)  Weight 69.9 kg  Pat Appointment Scheduled No  Reason for No Appointment Not Needed

## 2023-10-27 ENCOUNTER — Ambulatory Visit (HOSPITAL_BASED_OUTPATIENT_CLINIC_OR_DEPARTMENT_OTHER): Admitting: Anesthesiology

## 2023-10-27 ENCOUNTER — Ambulatory Visit (HOSPITAL_BASED_OUTPATIENT_CLINIC_OR_DEPARTMENT_OTHER)
Admission: RE | Admit: 2023-10-27 | Discharge: 2023-10-27 | Disposition: A | Discharge status: Home / Self Care | Attending: Otolaryngology | Admitting: Otolaryngology

## 2023-10-27 ENCOUNTER — Encounter (HOSPITAL_BASED_OUTPATIENT_CLINIC_OR_DEPARTMENT_OTHER): Payer: Self-pay | Admitting: Otolaryngology

## 2023-10-27 ENCOUNTER — Encounter (HOSPITAL_BASED_OUTPATIENT_CLINIC_OR_DEPARTMENT_OTHER)
Admission: RE | Disposition: A | Discharge status: Home / Self Care | Payer: Self-pay | Source: Home / Self Care | Attending: Otolaryngology

## 2023-10-27 ENCOUNTER — Other Ambulatory Visit: Payer: Self-pay

## 2023-10-27 DIAGNOSIS — J310 Chronic rhinitis: Secondary | ICD-10-CM | POA: Diagnosis not present

## 2023-10-27 DIAGNOSIS — Z87891 Personal history of nicotine dependence: Secondary | ICD-10-CM

## 2023-10-27 DIAGNOSIS — N289 Disorder of kidney and ureter, unspecified: Secondary | ICD-10-CM | POA: Insufficient documentation

## 2023-10-27 DIAGNOSIS — J343 Hypertrophy of nasal turbinates: Secondary | ICD-10-CM | POA: Insufficient documentation

## 2023-10-27 DIAGNOSIS — Z951 Presence of aortocoronary bypass graft: Secondary | ICD-10-CM | POA: Insufficient documentation

## 2023-10-27 DIAGNOSIS — I10 Essential (primary) hypertension: Secondary | ICD-10-CM | POA: Insufficient documentation

## 2023-10-27 DIAGNOSIS — J342 Deviated nasal septum: Secondary | ICD-10-CM

## 2023-10-27 DIAGNOSIS — J3489 Other specified disorders of nose and nasal sinuses: Secondary | ICD-10-CM | POA: Insufficient documentation

## 2023-10-27 DIAGNOSIS — I25119 Atherosclerotic heart disease of native coronary artery with unspecified angina pectoris: Secondary | ICD-10-CM

## 2023-10-27 DIAGNOSIS — I4891 Unspecified atrial fibrillation: Secondary | ICD-10-CM | POA: Diagnosis not present

## 2023-10-27 DIAGNOSIS — Z01818 Encounter for other preprocedural examination: Secondary | ICD-10-CM

## 2023-10-27 DIAGNOSIS — I2510 Atherosclerotic heart disease of native coronary artery without angina pectoris: Secondary | ICD-10-CM | POA: Insufficient documentation

## 2023-10-27 HISTORY — PX: NASAL SEPTOPLASTY W/ TURBINOPLASTY: SHX2070

## 2023-10-27 SURGERY — SEPTOPLASTY, NOSE, WITH NASAL TURBINATE REDUCTION
Anesthesia: General | Site: Nose | Laterality: Bilateral

## 2023-10-27 MED ORDER — HYDROMORPHONE HCL 1 MG/ML IJ SOLN: 0.2500 mg | INTRAMUSCULAR | Status: DC | PRN

## 2023-10-27 MED ORDER — MIDAZOLAM HCL 2 MG/2ML IJ SOLN: 0.5000 mg | Freq: Once | INTRAMUSCULAR | Status: DC | PRN

## 2023-10-27 MED ORDER — LACTATED RINGERS IV SOLN: INTRAVENOUS | Status: DC

## 2023-10-27 MED ORDER — PROPOFOL 10 MG/ML IV BOLUS
INTRAVENOUS | Status: DC | PRN
  Administered 2023-10-27: 120 mg via INTRAVENOUS

## 2023-10-27 MED ORDER — EPHEDRINE SULFATE (PRESSORS) 50 MG/ML IJ SOLN
INTRAMUSCULAR | Status: DC | PRN
  Administered 2023-10-27 (×2): 5 mg via INTRAVENOUS

## 2023-10-27 MED ORDER — LIDOCAINE-EPINEPHRINE 1 %-1:100000 IJ SOLN
INTRAMUSCULAR | Status: DC | PRN
  Administered 2023-10-27: 4 mL

## 2023-10-27 MED ORDER — ONDANSETRON HCL 4 MG/2ML IJ SOLN
INTRAMUSCULAR | Status: DC | PRN
  Administered 2023-10-27: 4 mg via INTRAVENOUS

## 2023-10-27 MED ORDER — OXYMETAZOLINE HCL 0.05 % NA SOLN
NASAL | Status: DC | PRN
  Administered 2023-10-27: 1 via TOPICAL

## 2023-10-27 MED ORDER — ONDANSETRON HCL 4 MG/2ML IJ SOLN
INTRAMUSCULAR | Status: AC
  Filled 2023-10-27: qty 2

## 2023-10-27 MED ORDER — OXYCODONE HCL 5 MG/5ML PO SOLN: 5.0000 mg | Freq: Once | ORAL | Status: DC | PRN

## 2023-10-27 MED ORDER — LIDOCAINE 2% (20 MG/ML) 5 ML SYRINGE
INTRAMUSCULAR | Status: AC
  Filled 2023-10-27: qty 5

## 2023-10-27 MED ORDER — ACETAMINOPHEN 500 MG PO TABS
ORAL_TABLET | ORAL | Status: AC
Start: 2023-10-27 — End: 2023-10-27
  Filled 2023-10-27: qty 2

## 2023-10-27 MED ORDER — DEXAMETHASONE SODIUM PHOSPHATE 4 MG/ML IJ SOLN
INTRAMUSCULAR | Status: DC | PRN
  Administered 2023-10-27: 4 mg via INTRAVENOUS

## 2023-10-27 MED ORDER — PHENYLEPHRINE HCL (PRESSORS) 10 MG/ML IV SOLN
INTRAVENOUS | Status: DC | PRN
Start: 2023-10-27 — End: 2023-10-27
  Administered 2023-10-27: 240 ug via INTRAVENOUS
  Administered 2023-10-27: 80 ug via INTRAVENOUS
  Administered 2023-10-27: 240 ug via INTRAVENOUS
  Administered 2023-10-27 (×2): 80 ug via INTRAVENOUS

## 2023-10-27 MED ORDER — OXYCODONE HCL 5 MG PO TABS: 5.0000 mg | ORAL_TABLET | Freq: Once | ORAL | Status: DC | PRN

## 2023-10-27 MED ORDER — ACETAMINOPHEN 500 MG PO TABS
1000.0000 mg | ORAL_TABLET | Freq: Once | ORAL | Status: AC
  Administered 2023-10-27: 1000 mg via ORAL

## 2023-10-27 MED ORDER — SUGAMMADEX SODIUM 200 MG/2ML IV SOLN
INTRAVENOUS | Status: DC | PRN
  Administered 2023-10-27: 200 mg via INTRAVENOUS

## 2023-10-27 MED ORDER — MUPIROCIN 2 % EX OINT
TOPICAL_OINTMENT | CUTANEOUS | Status: DC | PRN
  Administered 2023-10-27: 1 via TOPICAL

## 2023-10-27 MED ORDER — LIDOCAINE HCL (CARDIAC) PF 100 MG/5ML IV SOSY
PREFILLED_SYRINGE | INTRAVENOUS | Status: DC | PRN
  Administered 2023-10-27: 20 mg via INTRAVENOUS

## 2023-10-27 MED ORDER — PROPOFOL 10 MG/ML IV BOLUS
INTRAVENOUS | Status: AC
Start: 2023-10-27 — End: 2023-10-27
  Filled 2023-10-27: qty 20

## 2023-10-27 MED ORDER — FENTANYL CITRATE (PF) 100 MCG/2ML IJ SOLN
INTRAMUSCULAR | Status: DC | PRN
  Administered 2023-10-27 (×2): 50 ug via INTRAVENOUS

## 2023-10-27 MED ORDER — CEFAZOLIN SODIUM-DEXTROSE 2-4 GM/100ML-% IV SOLN
INTRAVENOUS | Status: AC
  Filled 2023-10-27: qty 100

## 2023-10-27 MED ORDER — ROCURONIUM BROMIDE 100 MG/10ML IV SOLN
INTRAVENOUS | Status: DC | PRN
  Administered 2023-10-27: 60 mg via INTRAVENOUS

## 2023-10-27 MED ORDER — CEFAZOLIN SODIUM-DEXTROSE 2-3 GM-%(50ML) IV SOLR
INTRAVENOUS | Status: DC | PRN
Start: 2023-10-27 — End: 2023-10-27
  Administered 2023-10-27: 2 g via INTRAVENOUS

## 2023-10-27 MED ORDER — ALBUTEROL SULFATE HFA 108 (90 BASE) MCG/ACT IN AERS
INHALATION_SPRAY | RESPIRATORY_TRACT | Status: DC | PRN
  Administered 2023-10-27: 2 via RESPIRATORY_TRACT

## 2023-10-27 MED ORDER — 0.9 % SODIUM CHLORIDE (POUR BTL) OPTIME
TOPICAL | Status: DC | PRN
Start: 2023-10-27 — End: 2023-10-27
  Administered 2023-10-27: 1000 mL

## 2023-10-27 MED ORDER — FENTANYL CITRATE (PF) 100 MCG/2ML IJ SOLN
INTRAMUSCULAR | Status: AC
  Filled 2023-10-27: qty 2

## 2023-10-27 MED ORDER — DEXAMETHASONE SODIUM PHOSPHATE 10 MG/ML IJ SOLN
INTRAMUSCULAR | Status: AC
Start: 2023-10-27 — End: 2023-10-27
  Filled 2023-10-27: qty 1

## 2023-10-27 SURGICAL SUPPLY — 27 items
CANISTER SUCT 1200ML W/VALVE (MISCELLANEOUS) ×1 IMPLANT
COAGULATOR SUCT 8FR VV (MISCELLANEOUS) ×1 IMPLANT
DEFOGGER MIRROR 1QT (MISCELLANEOUS) ×1 IMPLANT
DRSG NASOPORE 8CM (GAUZE/BANDAGES/DRESSINGS) IMPLANT
DRSG TELFA 3X8 NADH STRL (GAUZE/BANDAGES/DRESSINGS) IMPLANT
ELECTRODE REM PT RTRN 9FT ADLT (ELECTROSURGICAL) ×1 IMPLANT
GAUZE SPONGE 2X2 STRL 8-PLY (GAUZE/BANDAGES/DRESSINGS) ×1 IMPLANT
GLOVE BIO SURGEON STRL SZ7.5 (GLOVE) ×1 IMPLANT
GOWN STRL REUS W/ TWL LRG LVL3 (GOWN DISPOSABLE) ×2 IMPLANT
NDL HYPO 25X1 1.5 SAFETY (NEEDLE) ×1 IMPLANT
NEEDLE HYPO 25X1 1.5 SAFETY (NEEDLE) ×1 IMPLANT
NS IRRIG 1000ML POUR BTL (IV SOLUTION) ×1 IMPLANT
PACK BASIN DAY SURGERY FS (CUSTOM PROCEDURE TRAY) ×1 IMPLANT
PACK ENT DAY SURGERY (CUSTOM PROCEDURE TRAY) ×1 IMPLANT
SLEEVE SCD COMPRESS KNEE MED (STOCKING) IMPLANT
SPIKE FLUID TRANSFER (MISCELLANEOUS) IMPLANT
SPLINT NASAL AIRWAY SILICONE (MISCELLANEOUS) ×1 IMPLANT
SPONGE NEURO XRAY DETECT 1X3 (DISPOSABLE) ×1 IMPLANT
SUT CHROMIC 4 0 P 3 18 (SUTURE) ×1 IMPLANT
SUT CHROMIC 4 0 SH 27 (SUTURE) IMPLANT
SUT PLAIN 4 0 ~~LOC~~ 1 (SUTURE) ×1 IMPLANT
SUT PROLENE 3 0 PS 2 (SUTURE) ×1 IMPLANT
SUT VIC AB 4-0 P-3 18XBRD (SUTURE) IMPLANT
TOWEL GREEN STERILE FF (TOWEL DISPOSABLE) ×1 IMPLANT
TUBE SALEM SUMP 12FR 48 (TUBING) IMPLANT
TUBE SALEM SUMP 16F (TUBING) ×1 IMPLANT
YANKAUER SUCT BULB TIP NO VENT (SUCTIONS) ×1 IMPLANT

## 2023-10-27 NOTE — Op Note (Signed)
 DATE OF PROCEDURE: 10/27/2023  OPERATIVE REPORT   SURGEON: Daniel Moccasin, MD   PREOPERATIVE DIAGNOSES:  1. Severe nasal septal deviation.  2. Bilateral inferior turbinate hypertrophy.  3. Chronic nasal obstruction.  POSTOPERATIVE DIAGNOSES:  1. Severe nasal septal deviation.  2. Bilateral inferior turbinate hypertrophy.  3. Chronic nasal obstruction.  PROCEDURE PERFORMED:  1. Septoplasty.  2. Bilateral partial inferior turbinate resection.   ANESTHESIA: General endotracheal tube anesthesia.   COMPLICATIONS: None.   ESTIMATED BLOOD LOSS: 100 mL.   INDICATION FOR PROCEDURE: Jeff Graves is a 78 y.o. male with a history of chronic nasal obstruction. The patient was treated with saline irrigation, allergy medications, and steroid nasal sprays. However, the patient continued to be symptomatic. On examination, the patient was noted to have bilateral inferior turbinate hypertrophy and significant nasal septal deviation, causing significant nasal obstruction. Based on the above findings, the decision was made for the patient to undergo the above-stated procedures. The risks, benefits, alternatives, and details of the procedures were discussed with the patient. Questions were invited and answered. Informed consent was obtained.   DESCRIPTION OF PROCEDURE: The patient was taken to the operating room and placed supine on the operating table. General endotracheal tube anesthesia was administered by the anesthesiologist. The patient was positioned, and prepped and draped in the standard fashion for nasal surgery. Pledgets soaked with Afrin were placed in both nasal cavities for decongestion. The pledgets were subsequently removed.   Examination of the nasal cavity revealed a severe nasal septal deviation. 1% lidocaine  with 1:100,000 epinephrine  was injected onto the nasal septum bilaterally. A hemitransfixion incision was made on the left side. The mucosal flap was carefully elevated on the left side.  A cartilaginous incision was made 1 cm superior to the caudal margin of the nasal septum. Mucosal flap was also elevated on the right side in the similar fashion. It should be noted that due to the severe septal deviation, the deviated portion of the cartilaginous and bony septum had to be removed in piecemeal fashion. Once the deviated portions were removed, a straight midline septum was achieved. The septum was then quilted with 4-0 plain gut sutures. The hemitransfixion incision was closed with interrupted 4-0 chromic sutures.   The inferior one half of both hypertrophied inferior turbinate was crossclamped with a Kelly clamp. The inferior one half of each inferior turbinate was then resected with a pair of cross cutting scissors. Hemostasis was achieved with a suction cautery device.  Doyle splints were applied to the nasal septum.  The care of the patient was turned over to the anesthesiologist. The patient was awakened from anesthesia without difficulty. The patient was extubated and transferred to the recovery room in good condition.   OPERATIVE FINDINGS: Severe nasal septal deviation and bilateral inferior turbinate hypertrophy.   SPECIMEN: None.   FOLLOWUP CARE: The patient be discharged home once he is awake and alert. The patient will follow up in my office in 3 days for splint removal.   Shatara Stanek Dois Moccasin, MD

## 2023-10-27 NOTE — Anesthesia Procedure Notes (Signed)
 Procedure Name: Intubation Date/Time: 10/27/2023 9:41 AM  Performed by: Julieanne Fairy BROCKS, CRNAPre-anesthesia Checklist: Patient identified, Emergency Drugs available, Suction available and Patient being monitored Patient Re-evaluated:Patient Re-evaluated prior to induction Oxygen Delivery Method: Circle system utilized Preoxygenation: Pre-oxygenation with 100% oxygen Induction Type: IV induction Ventilation: Mask ventilation without difficulty Laryngoscope Size: Mac and 4 Grade View: Grade I Tube type: Oral Tube size: 7.5 mm Number of attempts: 1 Airway Equipment and Method: Stylet and Oral airway Placement Confirmation: ETT inserted through vocal cords under direct vision, positive ETCO2 and breath sounds checked- equal and bilateral Secured at: 22 cm Tube secured with: Tape Dental Injury: Teeth and Oropharynx as per pre-operative assessment

## 2023-10-27 NOTE — Transfer of Care (Signed)
 Immediate Anesthesia Transfer of Care Note  Patient: Jeff Graves  Procedure(s) Performed: SEPTOPLASTY, NOSE, WITH NASAL TURBINATE REDUCTION (Bilateral: Nose)  Patient Location: PACU  Anesthesia Type:General  Level of Consciousness: awake, alert , and oriented  Airway & Oxygen Therapy: Patient Spontanous Breathing and Patient connected to face mask oxygen  Post-op Assessment: Report given to RN and Post -op Vital signs reviewed and stable  Post vital signs: Reviewed and stable  Last Vitals:  Vitals Value Taken Time  BP 139/72 10/27/23 10:40  Temp    Pulse 57 10/27/23 10:42  Resp 21 10/27/23 10:42  SpO2 96 % 10/27/23 10:42  Vitals shown include unfiled device data.  Last Pain:  Vitals:   10/27/23 0842  PainSc: 0-No pain      Patients Stated Pain Goal: 6 (10/27/23 9157)  Complications: No notable events documented.

## 2023-10-27 NOTE — Anesthesia Postprocedure Evaluation (Signed)
 Anesthesia Post Note  Patient: DEMITRIOUS MCCANNON  Procedure(s) Performed: SEPTOPLASTY, NOSE, WITH NASAL TURBINATE REDUCTION (Bilateral: Nose)     Patient location during evaluation: PACU Anesthesia Type: General Level of consciousness: awake and alert, oriented and patient cooperative Pain management: pain level controlled Vital Signs Assessment: post-procedure vital signs reviewed and stable Respiratory status: spontaneous breathing, nonlabored ventilation and respiratory function stable Cardiovascular status: blood pressure returned to baseline and stable Postop Assessment: no apparent nausea or vomiting and able to ambulate Anesthetic complications: no   No notable events documented.  Last Vitals:  Vitals:   10/27/23 1100 10/27/23 1115  BP: 132/71   Pulse: (!) 55 (!) 56  Resp: 15 15  Temp:  (!) 36.3 C  SpO2: 94% 94%    Last Pain:  Vitals:   10/27/23 1115  PainSc: 0-No pain                 Tomica Arseneault,E. Mana Morison

## 2023-10-27 NOTE — Anesthesia Preprocedure Evaluation (Addendum)
 Anesthesia Evaluation  Patient identified by MRN, date of birth, ID band Patient awake    Reviewed: Allergy & Precautions, NPO status , Patient's Chart, lab work & pertinent test results, reviewed documented beta blocker date and time   History of Anesthesia Complications Negative for: history of anesthetic complications  Airway Mallampati: I  TM Distance: >3 FB Neck ROM: Full    Dental  (+) Dental Advisory Given, Teeth Intact   Pulmonary former smoker   breath sounds clear to auscultation       Cardiovascular hypertension, Pt. on medications and Pt. on home beta blockers (-) angina + CAD and + CABG (2020)  + dysrhythmias Atrial Fibrillation and Supra Ventricular Tachycardia  Rhythm:Regular Rate:Normal  '24 stress:  No ST deviation was noted.   LV perfusion is normal. There is no evidence of ischemia. There is no evidence of infarction.   Left ventricular function is normal. Nuclear stress EF: 55 %. The left ventricular ejection fraction is normal (55-65%). End diastolic cavity size is normal. End systolic cavity size is normal.  '23 ECHO: EF 55 to 60%.  1. The LV has normal function, no regional wall motion abnormalities. There is mild LVH  2. RVF is normal. The right ventricular size is mildly enlarged.   3. Left atrial size was moderately dilated.   4. Right atrial size was mildly dilated.   5. The mitral valve is normal in structure. Trivial mitral valve regurgitation. No evidence of mitral stenosis.   6. The aortic valve is tricuspid. Aortic valve regurgitation is mild. No aortic stenosis is present.      Neuro/Psych negative neurological ROS     GI/Hepatic negative GI ROS, Neg liver ROS,,,  Endo/Other  negative endocrine ROS    Renal/GU Renal InsufficiencyRenal disease     Musculoskeletal   Abdominal   Peds  Hematology   Anesthesia Other Findings   Reproductive/Obstetrics                               Anesthesia Physical Anesthesia Plan  ASA: 3  Anesthesia Plan: General   Post-op Pain Management: Minimal or no pain anticipated and Tylenol  PO (pre-op)*   Induction: Intravenous  PONV Risk Score and Plan: 2 and Ondansetron  and Dexamethasone   Airway Management Planned: Oral ETT  Additional Equipment: None  Intra-op Plan:   Post-operative Plan: Extubation in OR  Informed Consent: I have reviewed the patients History and Physical, chart, labs and discussed the procedure including the risks, benefits and alternatives for the proposed anesthesia with the patient or authorized representative who has indicated his/her understanding and acceptance.     Dental advisory given  Plan Discussed with: CRNA and Surgeon  Anesthesia Plan Comments:          Anesthesia Quick Evaluation

## 2023-10-27 NOTE — Discharge Instructions (Addendum)

## 2023-10-27 NOTE — H&P (Signed)
 Cc: Chronic nasal obstruction, postnasal drainage   HPI: The patient is a 78 year old male who returns today for his follow-up evaluation.  He was last seen in June 2025.  At that time, he was noted to have nasal mucosal congestion, nasal septal deviation, and bilateral inferior turbinate hypertrophy.  He was treated with Flonase nasal spray and nasal saline irrigation.  The patient returns today complaining of increasing nasal congestion for the past month.  He has been symptomatic despite the medical treatment.  He denies any facial pain, fever, or visual change.   Exam: General: Communicates without difficulty, well nourished, no acute distress. Head: Normocephalic, no evidence injury, no tenderness, facial buttresses intact without stepoff. Face/sinus: No tenderness to palpation and percussion. Facial movement is normal and symmetric. Eyes: PERRL, EOMI. No scleral icterus, conjunctivae clear. Neuro: CN II exam reveals vision grossly intact.  No nystagmus at any point of gaze. Ears: Auricles well formed without lesions.  Ear canals are intact without mass or lesion.  No erythema or edema is appreciated.  The TMs are intact without fluid. Nose: External evaluation reveals normal support and skin without lesions.  Dorsum is intact.  Anterior rhinoscopy reveals congested mucosa over anterior aspect of inferior turbinates and deviated septum.  No purulence noted. Oral:  Oral cavity and oropharynx are intact, symmetric, without erythema or edema.  Mucosa is moist without lesions. Neck: Full range of motion without pain.  There is no significant lymphadenopathy.  No masses palpable.  Thyroid  bed within normal limits to palpation.  Parotid glands and submandibular glands equal bilaterally without mass.  Trachea is midline. Neuro:  CN 2-12 grossly intact.    Assessment: 1.  Chronic rhinitis with nasal mucosal congestion, nasal septal deviation, and bilateral inferior turbinate hypertrophy.  More than 95% of his  nasal passageways are obstructed bilaterally. 2.  No polyps, mass, lesion, or purulent drainage is noted today.   Plan: 1.  The physical exam findings are reviewed with the patient. 2.  Continue with Flonase nasal spray and nasal saline irrigation daily. 3.  In light of his persistent symptoms, he may benefit from surgical intervention with septoplasty and bilateral inferior turbinate reduction.  The risk, benefits, alternatives, and details of the procedures are extensively discussed.  Questions are invited and answered. 4.  The patient would like to proceed with the procedures.

## 2023-10-28 ENCOUNTER — Telehealth (INDEPENDENT_AMBULATORY_CARE_PROVIDER_SITE_OTHER): Payer: Self-pay

## 2023-10-28 ENCOUNTER — Telehealth (INDEPENDENT_AMBULATORY_CARE_PROVIDER_SITE_OTHER): Payer: Self-pay | Admitting: Otolaryngology

## 2023-10-28 ENCOUNTER — Encounter (HOSPITAL_BASED_OUTPATIENT_CLINIC_OR_DEPARTMENT_OTHER): Payer: Self-pay | Admitting: Otolaryngology

## 2023-10-28 NOTE — Telephone Encounter (Signed)
 Talk to patient today , informed him that he need to breath thru his mouth and some bleeding from his nose is normal. His post op is on 10/30/23.

## 2023-10-28 NOTE — Telephone Encounter (Signed)
 Patient left a message stating that he had surgery yesterday, he is very congested and can hardly breathe.  Please contact him and advise as to next steps

## 2023-10-30 ENCOUNTER — Ambulatory Visit (INDEPENDENT_AMBULATORY_CARE_PROVIDER_SITE_OTHER): Admitting: Otolaryngology

## 2023-10-30 ENCOUNTER — Encounter (INDEPENDENT_AMBULATORY_CARE_PROVIDER_SITE_OTHER): Payer: Self-pay | Admitting: Otolaryngology

## 2023-10-30 VITALS — BP 125/76 | HR 62

## 2023-10-30 DIAGNOSIS — J310 Chronic rhinitis: Secondary | ICD-10-CM

## 2023-10-30 DIAGNOSIS — Z9889 Other specified postprocedural states: Secondary | ICD-10-CM

## 2023-10-30 NOTE — Progress Notes (Signed)
 Doyle splints removed. Septum and turbinates are healing well.   Both [place] debrided.  Nasal saline irrigation.  Recheck in 3 weeks.

## 2023-11-13 ENCOUNTER — Ambulatory Visit: Admitting: Dermatology

## 2023-11-20 DIAGNOSIS — H02403 Unspecified ptosis of bilateral eyelids: Secondary | ICD-10-CM | POA: Diagnosis not present

## 2023-11-24 ENCOUNTER — Ambulatory Visit (INDEPENDENT_AMBULATORY_CARE_PROVIDER_SITE_OTHER): Admitting: Otolaryngology

## 2023-12-01 NOTE — Progress Notes (Unsigned)
 No chief complaint on file.   History of Present Illness: 78 yo male with history of HLD, HTN, CKD, atrial tachycardia, SVT, PACs, PVCs and CAD s/p CABG October 2020 who is here today for cardiac follow up. He was admitted to [facility] October 2020 with unstable angina. Cardiac cath October 2020 with severe three vessel CAD. He underwent 4V CABG on 11/23/18 (LIMA to LAD, SVG to Circumflex, SVG sequential to PDA and PLA). He had a postoperative pneumothorax and post-operative atrial fibrillation. He converted to sinus quickly on amiodarone . Cardiac monitor in April 2023 with sinus and SVT. Echo in 2023 with normal LV systolic function. He is followed in our EP clinic by Dr. Kennyth. Nuclear stress test in May 2024 with no ischemia.   He is here today for follow up. The patient denies any chest pain, dyspnea, palpitations, lower extremity edema, orthopnea, PND, dizziness, near syncope or syncope.   Primary Care Physician: Kennyth Worth HERO, MD  Past Medical History:  Diagnosis Date   A-fib [facility])    Episode after surgery   Allergy    seasonal   Atrial tachycardia 06/04/2012   12/13-QRS duration-102 ms 6/14-QRS duration-98 ms    CAD (coronary artery disease)    a. s/p CABG 11/2018   CKD (chronic kidney disease), stage II    DDD (degenerative disc disease)    Diverticulosis of colon    Hemorrhoids    History of kidney stones    Hypercholesterolemia    Hypertension    NSVT (nonsustained ventricular tachycardia) (HCC)    PAC (premature atrial contraction)    Pneumothorax    Postoperative atrial fibrillation (HCC)    Renal calculus    RENAL CALCULUS, HX OF 12/26/2006   Qualifier: Diagnosis of  By: Tanda Milling     S/P rotator cuff surgery 04/15/2014   Shoulder pain    Sinus bradycardia     Past Surgical History:  Procedure Laterality Date   COLONOSCOPY     CORONARY ARTERY BYPASS GRAFT N/A 11/23/2018   Procedure: CORONARY ARTERY BYPASS GRAFTING (CABG), ON PUMP, TIMES FOUR, LIMA to LAD,  SEQ SVG to PDA, PLVB, SVG to CIRCUMFLEX, USING LEFT INTERNAL MAMMARY ARTERY AND RIGHT GREATER SAPHENOUS VEIN HARVESTED ENDOSCOPICALLY AND LEFT GREATER SAPHENOUS VEIN;  Surgeon: Army Dallas NOVAK, MD;  Location: [facility] OR;  Service: Open Heart Surgery;  Laterality: N/A;   CYSTOSCOPY/URETEROSCOPY/HOLMIUM LASER/STENT PLACEMENT Left 08/22/2022   Procedure: CYSTOSCOPY/LEFT RETROGRADE PYELOGRAM/ LEFT URETEROSCOPY/HOLMIUM LASER/LEFT URETERAL STENT PLACEMENT;  Surgeon: Renda Glance, MD;  Location: [facility] ORS;  Service: Urology;  Laterality: Left;  45 MINUTES NEEDED FOR CASE   LEFT HEART CATH AND CORONARY ANGIOGRAPHY N/A 11/18/2018   Procedure: LEFT HEART CATH AND CORONARY ANGIOGRAPHY;  Surgeon: Verlin Lonni BIRCH, MD;  Location: [facility] INVASIVE CV LAB;  Service: Cardiovascular;  Laterality: N/A;   NASAL SEPTOPLASTY W/ TURBINOPLASTY Bilateral 10/27/2023   Procedure: SEPTOPLASTY, NOSE, WITH NASAL TURBINATE REDUCTION;  Surgeon: Karis Clunes, MD;  Location: [facility];  Service: ENT;  Laterality: Bilateral;   None     SHOULDER SURGERY Left 03/29/2014   TEE WITHOUT CARDIOVERSION N/A 11/23/2018   Procedure: TRANSESOPHAGEAL ECHOCARDIOGRAM (TEE);  Surgeon: Army Dallas NOVAK, MD;  Location: [facility] OR;  Service: Open Heart Surgery;  Laterality: N/A;    Current Outpatient Medications  Medication Sig Dispense Refill   amLODipine  (NORVASC ) 5 MG tablet TAKE 1 TABLET BY MOUTH DAILY 90 tablet 3   atorvastatin  (LIPITOR ) 80 MG tablet TAKE 1 TABLET BY MOUTH DAILY 90 tablet  3   Cholecalciferol (VITAMIN D ) 2000 units CAPS Take 2,000 Units by mouth daily.     Cyanocobalamin (VITAMIN B-12) 5000 MCG LOZG Take 5,000 mcg by mouth daily.      metoprolol  tartrate (LOPRESSOR ) 25 MG tablet Take 1 tablet (25 mg total) by mouth 2 (two) times daily. (Patient taking differently: Take 12.5 mg by mouth 2 (two) times daily.) 180 tablet 0   Multiple Vitamins-Minerals (MULTIVITAMIN WITH MINERALS) tablet Take 1 tablet by mouth daily.     No  current facility-administered medications for this visit.    No Known Allergies  Social History   Socioeconomic History   Marital status: Married    Spouse name: brenda   Number of children: 1   Years of education: 12   Highest education level: High school graduate  Occupational History   Occupation: HVAC with air quality    Employer: [employer]  Tobacco Use   Smoking status: Former    Current packs/day: 0.00    Average packs/day: 0.5 packs/day for 20.0 years (10.0 ttl pk-yrs)    Types: Cigarettes    Start date: 02/12/1964    Quit date: 02/12/1984    Years since quitting: 39.8   Smokeless tobacco: Never  Vaping Use   Vaping status: Never Used  Substance and Sexual Activity   Alcohol use: No    Alcohol/week: 0.0 standard drinks of alcohol    Comment: social use   Drug use: No   Sexual activity: Not on file  Other Topics Concern   Not on file  Social History Narrative   Not on file   Social Drivers of Health   Financial Resource Strain: Low Risk  (12/16/2022)   Overall Financial Resource Strain (CARDIA)    Difficulty of Paying Living Expenses: Not hard at all  Food Insecurity: No Food Insecurity (12/16/2022)   Hunger Vital Sign    Worried About Running Out of Food in the Last Year: Never true    Ran Out of Food in the Last Year: Never true  Transportation Needs: No Transportation Needs (12/16/2022)   PRAPARE - Administrator, Civil Service (Medical): No    Lack of Transportation (Non-Medical): No  Physical Activity: Sufficiently Active (12/16/2022)   Exercise Vital Sign    Days of Exercise per Week: 4 days    Minutes of Exercise per Session: 60 min  Stress: No Stress Concern Present (12/16/2022)   [employer] of Occupational Health - Occupational Stress Questionnaire    Feeling of Stress : Not at all  Social Connections: Moderately Integrated (12/16/2022)   Social Connection and Isolation Panel    Frequency of Communication with Friends and  Family: More than three times a week    Frequency of Social Gatherings with Friends and Family: More than three times a week    Attends Religious Services: More than 4 times per year    Active Member of [employer] or Organizations: No    Attends Banker Meetings: Never    Marital Status: Married  Catering manager Violence: Not At Risk (12/16/2022)   Humiliation, Afraid, Rape, and Kick questionnaire    Fear of Current or Ex-Partner: No    Emotionally Abused: No    Physically Abused: No    Sexually Abused: No    Family History  Problem Relation Age of Onset   Heart attack Father 43   Valvular heart disease Mother 70   Lymphoma Sister        Deceased  Breast cancer Sister        Alive   Colon cancer Neg Hx    Esophageal cancer Neg Hx    Rectal cancer Neg Hx    Stomach cancer Neg Hx     Review of Systems:  As stated in the HPI and otherwise negative.   There were no vitals taken for this visit.  Physical Examination:  General: Well developed, well nourished, NAD  HEENT: OP clear, mucus membranes moist  SKIN: warm, dry. No rashes. Neuro: No focal deficits  Musculoskeletal: Muscle strength 5/5 all ext  Psychiatric: Mood and affect normal  Neck: No JVD, no carotid bruits, no thyromegaly, no lymphadenopathy.  Lungs:Clear bilaterally, no wheezes, rhonci, crackles Cardiovascular: Regular rate and rhythm. No murmurs, gallops or rubs. Abdomen:Soft. Bowel sounds present. Non-tender.  Extremities: No lower extremity edema. Pulses are 2 + in the bilateral DP/PT.  EKG:  EKG is *** ordered today. The ekg ordered today demonstrates   Recent Labs: No results found for requested labs within last 365 days.   Lipid Panel    Component Value Date/Time   CHOL 126 04/02/2022 0749   TRIG 41 04/02/2022 0749   HDL 73 04/02/2022 0749   CHOLHDL 1.7 04/02/2022 0749   CHOLHDL 2 02/16/2021 0846   VLDL 8.0 02/16/2021 0846   LDLCALC 43 04/02/2022 0749     Wt Readings from Last 3  Encounters:  10/27/23 144 lb 13.5 oz (65.7 kg)  09/16/23 154 lb (69.9 kg)  08/19/23 150 lb (68 kg)    Assessment and Plan:   1. CAD s/p CABG without angina: No chest pain. Continue ASA, metoprolol  and Lipitor   2.Post-operative atrial fibrillation: No known recurrence of atrial fibrillation since his post op period following CABG.   3. Hyperlipidemia: LDL ***. Continue Lipitor   4. SVT/PVCs/PACs: Followed by Dr. Kennyth. Continue metoprolol   Labs/ tests ordered today include:  No orders of the defined types were placed in this encounter.  Disposition:   F/U with me in 12 months  Signed, Lonni Cash, MD 12/01/2023 9:04 PM    [facility] [address] [place], [place], [place]  [address] Phone: [phone]; Fax: [phone]

## 2023-12-02 ENCOUNTER — Ambulatory Visit: Attending: Cardiovascular Disease | Admitting: Cardiovascular Disease

## 2023-12-02 ENCOUNTER — Encounter: Payer: Self-pay | Admitting: Cardiovascular Disease

## 2023-12-02 VITALS — BP 120/70 | HR 54 | Ht 70.0 in | Wt 144.8 lb

## 2023-12-02 DIAGNOSIS — I2510 Atherosclerotic heart disease of native coronary artery without angina pectoris: Secondary | ICD-10-CM

## 2023-12-02 DIAGNOSIS — I471 Supraventricular tachycardia, unspecified: Secondary | ICD-10-CM | POA: Diagnosis not present

## 2023-12-02 DIAGNOSIS — E7800 Pure hypercholesterolemia, unspecified: Secondary | ICD-10-CM | POA: Diagnosis not present

## 2023-12-02 LAB — LIPID PANEL

## 2023-12-02 NOTE — Patient Instructions (Signed)
 Medication Instructions:  Your physician recommends that you continue on your current medications as directed. Please refer to the Current Medication list given to you today.  *If you need a refill on your cardiac medications before your next appointment, please call your pharmacy*  Lab Work: Have lab work drawn in the lab on the first floor today--Lipid and liver profiles If you have labs (blood work) drawn today and your tests are completely normal, you will receive your results only by: MyChart Message (if you have MyChart) OR A paper copy in the mail If you have any lab test that is abnormal or we need to change your treatment, we will call you to review the results.  Testing/Procedures: none  Follow-Up: At [facility], you and your health needs are our priority.  As part of our continuing mission to provide you with exceptional heart care, our providers are all part of one team.  This team includes your primary Cardiologist (physician) and Advanced Practice Providers or APPs (Physician Assistants and Nurse Practitioners) who all work together to provide you with the care you need, when you need it.  Your next appointment:   12 month(s)  Provider:   Lonni Cash, MD    We recommend signing up for the patient portal called MyChart.  Sign up information is provided on this After Visit Summary.  MyChart is used to connect with patients for Virtual Visits (Telemedicine).  Patients are able to view lab/test results, encounter notes, upcoming appointments, etc.  Non-urgent messages can be sent to your provider as well.   To learn more about what you can do with MyChart, go to [email or web address].   Other Instructions

## 2023-12-03 ENCOUNTER — Encounter (INDEPENDENT_AMBULATORY_CARE_PROVIDER_SITE_OTHER): Payer: Self-pay | Admitting: Otolaryngology

## 2023-12-03 ENCOUNTER — Ambulatory Visit (INDEPENDENT_AMBULATORY_CARE_PROVIDER_SITE_OTHER): Admitting: Otolaryngology

## 2023-12-03 ENCOUNTER — Ambulatory Visit: Payer: Self-pay | Admitting: Cardiovascular Disease

## 2023-12-03 VITALS — BP 154/82 | HR 60 | Temp 97.5°F

## 2023-12-03 DIAGNOSIS — J310 Chronic rhinitis: Secondary | ICD-10-CM

## 2023-12-03 LAB — LIPID PANEL
Cholesterol, Total: 130 mg/dL (ref 100–199)
HDL: 74 mg/dL (ref >39)
LDL CALC COMMENT:: 1.8 ratio (ref 0.0–5.0)
LDL Chol Calc (NIH): 46 mg/dL (ref 0–99)
Triglycerides: 41 mg/dL (ref 0–149)
VLDL Cholesterol Cal: 10 mg/dL (ref 5–40)

## 2023-12-03 LAB — HEPATIC FUNCTION PANEL
ALT: 20 IU/L (ref 0–44)
AST: 24 IU/L (ref 0–40)
Albumin: 3.9 g/dL (ref 3.8–4.8)
Alkaline Phosphatase: 95 IU/L (ref 47–123)
Bilirubin Total: 0.6 mg/dL (ref 0.0–1.2)
Bilirubin, Direct: 0.25 mg/dL (ref 0.00–0.40)
Total Protein: 6.2 g/dL (ref 6.0–8.5)

## 2023-12-03 NOTE — Progress Notes (Signed)
 Septum and turbinates are healing well.   Both [place] debrided.   Nasal saline irrigation as needed.   Recheck in 6 months.

## 2023-12-04 ENCOUNTER — Encounter: Payer: Self-pay | Admitting: Family Medicine

## 2023-12-04 ENCOUNTER — Ambulatory Visit: Admitting: Family Medicine

## 2023-12-04 ENCOUNTER — Ambulatory Visit (INDEPENDENT_AMBULATORY_CARE_PROVIDER_SITE_OTHER): Admitting: Family Medicine

## 2023-12-04 ENCOUNTER — Ambulatory Visit

## 2023-12-04 VITALS — BP 112/70 | HR 57 | Temp 97.9°F | Ht 70.0 in | Wt 147.2 lb

## 2023-12-04 DIAGNOSIS — N1831 Chronic kidney disease, stage 3a: Secondary | ICD-10-CM | POA: Diagnosis not present

## 2023-12-04 DIAGNOSIS — R059 Cough, unspecified: Secondary | ICD-10-CM | POA: Diagnosis not present

## 2023-12-04 DIAGNOSIS — Z87891 Personal history of nicotine dependence: Secondary | ICD-10-CM | POA: Diagnosis not present

## 2023-12-04 DIAGNOSIS — I509 Heart failure, unspecified: Secondary | ICD-10-CM

## 2023-12-04 DIAGNOSIS — I10 Essential (primary) hypertension: Secondary | ICD-10-CM

## 2023-12-04 NOTE — Assessment & Plan Note (Signed)
 At goal today on amlodipine  5 mg daily and metoprolol  tartrate 12.5 mg twice daily.

## 2023-12-04 NOTE — Assessment & Plan Note (Signed)
 Patient does not qualify for low-dose CT lung cancer screening though may have a small amount of underlying COPD or emphysema contributing to his cough as above.  We are checking chest x-ray and referring to pulmonology.

## 2023-12-04 NOTE — Assessment & Plan Note (Signed)
 Patient scheduled to come back soon for CPE.  We can recheck labs at that time.

## 2023-12-04 NOTE — Progress Notes (Signed)
   Jeff Graves is a 78 y.o. male who presents today for an office visit.  Assessment/Plan:  New/Acute Problems: Cough  This has been persistent for the last 4 months.  Did have rhonchi on exam today which cleared with coughing which may indicate upper respiratory source however he saw ENT yesterday and they told him there was nothing else that they can offer.  Given persistence of cough would be reasonable to pursue further workup at this time.  Will check chest x-ray.  Did discuss his history is a smoker and he may have a small amount of emphysema or COPD.  ENT recommended referral to pulmonology and he wishes to have this placed today.  Will place referral.  We did discuss trial of inhaler however he declined.  Will defer further management to pulmonology.  We discussed reasons to return to care.  Chronic Problems Addressed Today: Former smoker Patient does not qualify for low-dose CT lung cancer screening though may have a small amount of underlying COPD or emphysema contributing to his cough as above.  We are checking chest x-ray and referring to pulmonology.  Essential hypertension At goal today on amlodipine  5 mg daily and metoprolol  tartrate 12.5 mg twice daily.  Heart failure, unspecified HF chronicity, unspecified heart failure type [facility]) Continue management per cardiology.  Euvolemic today.  He recently saw cardiology.  CKD (chronic kidney disease), stage III Patient scheduled to come back soon for CPE.  We can recheck labs at that time.     Subjective:  HPI:  See assessment / plan for status of chronic conditions.   Discussed the use of AI scribe software for clinical note transcription with the patient, who gave verbal consent to proceed.  History of Present Illness Jeff Graves is a 78 year old male who presents with persistent cough and congestion.  He has been experiencing a persistent cough and congestion for several months, which have worsened over time.  Initially, he attributed these symptoms to a recent septoplasty that caused nasal obstruction and required mouth breathing. Despite the surgery, the symptoms have not improved.  The cough is described as congestive. No sore throat recently. The persistence of these symptoms without resolution is concerning to him.  He has a significant smoking history, having smoked about a pack a day from his teenage years until his mid-20s, but he quit smoking approximately fifty years ago. No current symptoms of heartburn or reflux, which can sometimes contribute to cough.  He experiences difficulty breathing and coughing at night, which sometimes wakes him up. He does not believe he has sleep apnea. He has not required any medication refills recently and reports that his cardiologist did not find any heart-related issues contributing to his symptoms.        Objective:  Physical Exam: BP 112/70   Pulse (!) 57   Temp 97.9 F (36.6 C) (Temporal)   Ht 5' 10 (1.778 m)   Wt 147 lb 3.2 oz (66.8 kg)   SpO2 98%   BMI 21.12 kg/m   Gen: No acute distress, resting comfortably HEENT: OP clear.  Nasal mucosa clear CV: Regular rate and rhythm with no murmurs appreciated Pulm: Normal work of breathing, rhonchi noted which cleared with coughing.  Otherwise clear to auscultation bilaterally without wheezes or rhonchi.  Speaking in full sentences. Neuro: Grossly normal, moves all extremities Psych: Normal affect and thought content      Jeff Takeshita M. Kennyth, MD 12/04/2023 2:59 PM

## 2023-12-04 NOTE — Assessment & Plan Note (Signed)
 Continue management per cardiology.  Euvolemic today.  He recently saw cardiology.

## 2023-12-04 NOTE — Patient Instructions (Signed)
 It was very nice to see you today!  VISIT SUMMARY: You came in today because of a persistent cough and congestion that have been getting worse over the past few months. We discussed your symptoms and your history, including your past smoking habits and recent septoplasty.  YOUR PLAN: CHRONIC COUGH: You have been experiencing a persistent cough and congestion for several months, which have worsened over time. Your cardiologist has ruled out heart-related issues. -We will order a chest x-ray to get a better look at your lungs. -You will be referred to a pulmonologist for further evaluation and pulmonary function testing. -If there is a delay in getting an appointment with the pulmonologist, we may consider a CT scan. -We will contact you with the results of your chest x-ray.  Return if symptoms worsen or fail to improve.   Take care, Dr Kennyth  PLEASE NOTE:  If you had any lab tests, please let us  know if you have not heard back within a few days. You may see your results on mychart before we have a chance to review them but we will give you a call once they are reviewed by us .   If we ordered any referrals today, please let us  know if you have not heard from their office within the next week.   If you had any urgent prescriptions sent in today, please check with the pharmacy within an hour of our visit to make sure the prescription was transmitted appropriately.   Please try these tips to maintain a healthy lifestyle:  Eat at least 3 REAL meals and 1-2 snacks per day.  Aim for no more than 5 hours between eating.  If you eat breakfast, please do so within one hour of getting up.   Each meal should contain half fruits/vegetables, one quarter protein, and one quarter carbs (no bigger than a computer mouse)  Cut down on sweet beverages. This includes juice, soda, and sweet tea.   Drink at least 1 glass of water with each meal and aim for at least 8 glasses per day  Exercise at least  150 minutes every week.

## 2023-12-05 ENCOUNTER — Ambulatory Visit: Payer: Self-pay | Admitting: Family Medicine

## 2023-12-05 NOTE — Progress Notes (Signed)
 Chest x-ray is normal.  No signs of infection.  Recommend he follow-up with pulmonology soon as we discussed at his office visit.

## 2023-12-18 ENCOUNTER — Encounter: Admitting: Family Medicine

## 2023-12-22 ENCOUNTER — Ambulatory Visit: Payer: HMO

## 2023-12-22 VITALS — BP 118/68 | HR 61 | Temp 97.9°F | Ht 70.0 in | Wt 147.7 lb

## 2023-12-22 DIAGNOSIS — Z0000 Encounter for general adult medical examination without abnormal findings: Secondary | ICD-10-CM | POA: Diagnosis not present

## 2023-12-22 NOTE — Progress Notes (Signed)
 Subjective:   Jeff Graves is a 77 y.o. male who presents for a Medicare Annual Wellness Visit.  Allergies (verified) Patient has no known allergies.   History: Past Medical History:  Diagnosis Date   A-fib [facility])    Episode after surgery   Allergy    seasonal   Atrial tachycardia 06/04/2012   12/13-QRS duration-102 ms 6/14-QRS duration-98 ms    CAD (coronary artery disease)    a. s/p CABG 11/2018   CKD (chronic kidney disease), stage II    DDD (degenerative disc disease)    Diverticulosis of colon    Hemorrhoids    History of kidney stones    Hypercholesterolemia    Hypertension    NSVT (nonsustained ventricular tachycardia) (HCC)    PAC (premature atrial contraction)    Pneumothorax    Postoperative atrial fibrillation (HCC)    Renal calculus    RENAL CALCULUS, HX OF 12/26/2006   Qualifier: Diagnosis of  By: Tanda Milling     S/P rotator cuff surgery 04/15/2014   Shoulder pain    Sinus bradycardia    Past Surgical History:  Procedure Laterality Date   COLONOSCOPY     CORONARY ARTERY BYPASS GRAFT N/A 11/23/2018   Procedure: CORONARY ARTERY BYPASS GRAFTING (CABG), ON PUMP, TIMES FOUR, LIMA to LAD, SEQ SVG to PDA, PLVB, SVG to CIRCUMFLEX, USING LEFT INTERNAL MAMMARY ARTERY AND RIGHT GREATER SAPHENOUS VEIN HARVESTED ENDOSCOPICALLY AND LEFT GREATER SAPHENOUS VEIN;  Surgeon: Army Dallas NOVAK, MD;  Location: [facility] OR;  Service: Open Heart Surgery;  Laterality: N/A;   CYSTOSCOPY/URETEROSCOPY/HOLMIUM LASER/STENT PLACEMENT Left 08/22/2022   Procedure: CYSTOSCOPY/LEFT RETROGRADE PYELOGRAM/ LEFT URETEROSCOPY/HOLMIUM LASER/LEFT URETERAL STENT PLACEMENT;  Surgeon: Renda Glance, MD;  Location: [facility] ORS;  Service: Urology;  Laterality: Left;  45 MINUTES NEEDED FOR CASE   LEFT HEART CATH AND CORONARY ANGIOGRAPHY N/A 11/18/2018   Procedure: LEFT HEART CATH AND CORONARY ANGIOGRAPHY;  Surgeon: Verlin Lonni BIRCH, MD;  Location: [facility] INVASIVE CV LAB;  Service: Cardiovascular;   Laterality: N/A;   NASAL SEPTOPLASTY W/ TURBINOPLASTY Bilateral 10/27/2023   Procedure: SEPTOPLASTY, NOSE, WITH NASAL TURBINATE REDUCTION;  Surgeon: Karis Clunes, MD;  Location: [facility];  Service: ENT;  Laterality: Bilateral;   None     SHOULDER SURGERY Left 03/29/2014   TEE WITHOUT CARDIOVERSION N/A 11/23/2018   Procedure: TRANSESOPHAGEAL ECHOCARDIOGRAM (TEE);  Surgeon: Army Dallas NOVAK, MD;  Location: [facility] OR;  Service: Open Heart Surgery;  Laterality: N/A;   Family History  Problem Relation Age of Onset   Heart attack Father 22   Valvular heart disease Mother 63   Lymphoma Sister        Deceased   Breast cancer Sister        Alive   Colon cancer Neg Hx    Esophageal cancer Neg Hx    Rectal cancer Neg Hx    Stomach cancer Neg Hx    Social History   Occupational History   Occupation: HVAC with air quality    Employer: [employer]  Tobacco Use   Smoking status: Former    Current packs/day: 0.00    Average packs/day: 0.5 packs/day for 20.0 years (10.0 ttl pk-yrs)    Types: Cigarettes    Start date: 02/12/1964    Quit date: 02/12/1984    Years since quitting: 39.8   Smokeless tobacco: Never  Vaping Use   Vaping status: Never Used  Substance and Sexual Activity   Alcohol use: No    Alcohol/week: 0.0  standard drinks of alcohol    Comment: social use   Drug use: No   Sexual activity: Not on file   Tobacco Counseling Counseling given: Not Answered  SDOH Screenings   Food Insecurity: No Food Insecurity (12/22/2023)  Housing: Unknown (12/22/2023)  Transportation Needs: No Transportation Needs (12/22/2023)  Utilities: Not At Risk (12/22/2023)  Depression (PHQ2-9): Low Risk  (12/22/2023)  Financial Resource Strain: Low Risk  (12/16/2022)  Physical Activity: Sufficiently Active (12/22/2023)  Social Connections: Moderately Integrated (12/22/2023)  Stress: No Stress Concern Present (12/22/2023)  Tobacco Use: Medium Risk (12/22/2023)  Health Literacy:  Adequate Health Literacy (12/22/2023)   Depression Screen    12/22/2023    9:34 AM 12/04/2023    2:24 PM 08/07/2023    2:38 PM 04/17/2023    8:43 AM 02/25/2023   11:53 AM 12/16/2022   10:04 AM 04/08/2022   12:01 PM  PHQ 2/9 Scores  PHQ - 2 Score 0 0 0 0 0 0 0     Goals Addressed               This Visit's Progress     maintain health (pt-stated)        Maintain health and activity        Visit info / Clinical Intake: Medicare Wellness Visit Type:: Subsequent Annual Wellness Visit Medicare Wellness Visit Mode:: In-person (required for WTM) Interpreter Needed?: No Pre-visit prep was completed: yes AWV questionnaire completed by patient prior to visit?: no Living arrangements:: lives with spouse/significant other Patient's Overall Health Status Rating: very good Typical amount of pain: none Does pain affect daily life?: no Are you currently prescribed opioids?: no  Dietary Habits and Nutritional Risks How many meals a day?: 3 Eats fruit and vegetables daily?: yes (some) Most meals are obtained by: eating out; preparing own meals Diabetic:: no  Functional Status Activities of Daily Living (to include ambulation/medication): Independent Ambulation: Independent with device- listed below Home Assistive Devices/Equipment: Eyeglasses Medication Administration: Independent Home Management: Independent Manage your own finances?: yes Primary transportation is: driving Concerns about vision?: no *vision screening is required for WTM* Concerns about hearing?: no  Fall Screening Falls in the past year?: 0 Number of falls in past year: 0 Was there an injury with Fall?: 0 Fall Risk Category Calculator: 0 Patient Fall Risk Level: Low Fall Risk  Fall Risk Patient at Risk for Falls Due to: No Fall Risks Fall risk Follow up: Falls prevention discussed  Home and Transportation Safety: All rugs have non-skid backing?: N/A, no rugs All stairs or steps have railings?:  yes Grab bars in the bathtub or shower?: yes Have non-skid surface in bathtub or shower?: yes Good home lighting?: yes Regular seat belt use?: yes Hospital stays in the last year:: no  Cognitive Assessment Difficulty concentrating, remembering, or making decisions? : no Will 6CIT or Mini Cog be Completed: no 6CIT or Mini Cog Declined: patient alert, oriented, able to answer questions appropriately and recall recent events  Advance Directives (For Healthcare) Does Patient Have a Medical Advance Directive?: Yes Does patient want to make changes to medical advance directive?: No - Patient declined Would patient like information on creating a medical advance directive?: No - Patient declined  Reviewed/Updated  Reviewed/Updated: All        Objective:    Today's Vitals   12/22/23 0926  BP: 118/68  Pulse: 61  Temp: 97.9 F (36.6 C)  SpO2: 97%  Weight: 147 lb 11.2 oz (67 kg)  Height: 5' 10 (1.778  m)   Body mass index is 21.19 kg/m.  Current Medications (verified) Outpatient Encounter Medications as of 12/22/2023  Medication Sig   amLODipine  (NORVASC ) 5 MG tablet TAKE 1 TABLET BY MOUTH DAILY   atorvastatin  (LIPITOR ) 80 MG tablet TAKE 1 TABLET BY MOUTH DAILY   Cholecalciferol (VITAMIN D ) 2000 units CAPS Take 2,000 Units by mouth daily.   Cyanocobalamin (VITAMIN B-12) 5000 MCG LOZG Take 5,000 mcg by mouth daily.    metoprolol  tartrate (LOPRESSOR ) 25 MG tablet Take 1 tablet (25 mg total) by mouth 2 (two) times daily.   Multiple Vitamins-Minerals (MULTIVITAMIN WITH MINERALS) tablet Take 1 tablet by mouth daily.   No facility-administered encounter medications on file as of 12/22/2023.   Hearing/Vision screen Hearing Screening - Comments:: Pt denies any hearing issues  Vision Screening - Comments:: Wears rx glasses - up to date with routine eye exams with Dr Thom Hamilton  Immunizations and Health Maintenance Health Maintenance  Topic Date Due   Influenza Vaccine  05/11/2024  (Originally 09/12/2023)   DTaP/Tdap/Td (2 - Td or Tdap) 12/03/2024 (Originally 01/22/2022)   Hepatitis C Screening  06/07/2028 (Originally 02/23/1963)   Medicare Annual Wellness (AWV)  12/21/2024   Pneumococcal Vaccine: 50+ Years  Completed   Zoster Vaccines- Shingrix  Completed   Meningococcal B Vaccine  Aged Out   Colonoscopy  Discontinued   COVID-19 Vaccine  Discontinued        Assessment/Plan:  This is a routine wellness examination for Jeff Graves.  Patient Care Team: Kennyth Worth HERO, MD as PCP - General (Family Medicine) Verlin Lonni BIRCH, MD as PCP - Cardiology (Cardiology) Kennyth Chew, MD as PCP - Electrophysiology (Cardiology) Hamilton Thom, OD as Referring Physician (Optometry)  I have personally reviewed and noted the following in the patient's chart:   Medical and social history Use of alcohol, tobacco or illicit drugs  Current medications and supplements including opioid prescriptions. Functional ability and status Nutritional status Physical activity Advanced directives List of other physicians Hospitalizations, surgeries, and ER visits in previous 12 months Vitals Screenings to include cognitive, depression, and falls Referrals and appointments  No orders of the defined types were placed in this encounter.  In addition, I have reviewed and discussed with patient certain preventive protocols, quality metrics, and best practice recommendations. A written personalized care plan for preventive services as well as general preventive health recommendations were provided to patient.   Jeff VEAR Haws, LPN   [id number]/89/7974   Return in 1 year (on 12/21/2024).  After Visit Summary: (In Person-Declined) Patient declined AVS at this time.  Nurse Notes: nothing significant at this time pt will get flu vaccine with wife at [facility]

## 2023-12-22 NOTE — Patient Instructions (Signed)
 [name],  Thank you for taking the time for your Medicare Wellness Visit. I appreciate your continued commitment to your health goals. Please review the care plan we discussed, and feel free to reach out if I can assist you further.  Please note that Annual Wellness Visits do not include a physical exam. Some assessments may be limited, especially if the visit was conducted virtually. If needed, we may recommend an in-person follow-up with your provider.  Ongoing Care Seeing your primary care provider every 3 to 6 months helps us  monitor your health and provide consistent, personalized care.   Referrals If a referral was made during today's visit and you haven't received any updates within two weeks, please contact the referred provider directly to check on the status.  Recommended Screenings:  Health Maintenance  Topic Date Due   Flu Shot  09/12/2023   DTaP/Tdap/Td vaccine (2 - Td or Tdap) 12/03/2024*   Hepatitis C Screening  06/07/2028*   Medicare Annual Wellness Visit  12/21/2024   Pneumococcal Vaccine for age over 68  Completed   Zoster (Shingles) Vaccine  Completed   Meningitis B Vaccine  Aged Out   Colon Cancer Screening  Discontinued   COVID-19 Vaccine  Discontinued  *Topic was postponed. The date shown is not the original due date.       10/27/2023    8:33 AM  Advanced Directives  Does Patient Have a Medical Advance Directive? No  Would patient like information on creating a medical advance directive? No - Patient declined    Vision: Annual vision screenings are recommended for early detection of glaucoma, cataracts, and diabetic retinopathy. These exams can also reveal signs of chronic conditions such as diabetes and high blood pressure.  Dental: Annual dental screenings help detect early signs of oral cancer, gum disease, and other conditions linked to overall health, including heart disease and diabetes.  Please see the attached documents for additional preventive  care recommendations.

## 2023-12-23 NOTE — Progress Notes (Signed)
 New Patient Pulmonology Office Visit   Subjective:  Patient ID: Jeff Graves, male    DOB: 05-03-1945  MRN: [id number]  Referred by: Kennyth Worth HERO, MD  CC: No chief complaint on file.   HPI Jeff Graves is a 78 y.o. male with ***  Persistent cough  Former smoker Patient does not qualify for low-dose CT lung cancer screening though may have a small amount of underlying COPD or emphysema contributing to his cough as above.  We are checking chest x-ray and referring to pulmonology.  have nasal mucosal congestion, nasal septal deviation, and bilateral inferior turbinate hypertrophy. He was treated with Flonase nasal spray and nasal saline irrigation.   {PULM QUESTIONNAIRES (Optional):33196}  ROS  Allergies: Patient has no known allergies.  Current Outpatient Medications:    amLODipine  (NORVASC ) 5 MG tablet, TAKE 1 TABLET BY MOUTH DAILY, Disp: 90 tablet, Rfl: 3   atorvastatin  (LIPITOR ) 80 MG tablet, TAKE 1 TABLET BY MOUTH DAILY, Disp: 90 tablet, Rfl: 3   Cholecalciferol (VITAMIN D ) 2000 units CAPS, Take 2,000 Units by mouth daily., Disp: , Rfl:    Cyanocobalamin (VITAMIN B-12) 5000 MCG LOZG, Take 5,000 mcg by mouth daily. , Disp: , Rfl:    metoprolol  tartrate (LOPRESSOR ) 25 MG tablet, Take 1 tablet (25 mg total) by mouth 2 (two) times daily., Disp: 180 tablet, Rfl: 0   Multiple Vitamins-Minerals (MULTIVITAMIN WITH MINERALS) tablet, Take 1 tablet by mouth daily., Disp: , Rfl:  Past Medical History:  Diagnosis Date   A-fib (HCC)    Episode after surgery   Allergy    seasonal   Atrial tachycardia 06/04/2012   12/13-QRS duration-102 ms 6/14-QRS duration-98 ms    CAD (coronary artery disease)    a. s/p CABG 11/2018   CKD (chronic kidney disease), stage II    DDD (degenerative disc disease)    Diverticulosis of colon    Hemorrhoids    History of kidney stones    Hypercholesterolemia    Hypertension    NSVT (nonsustained ventricular tachycardia) (HCC)    PAC  (premature atrial contraction)    Pneumothorax    Postoperative atrial fibrillation (HCC)    Renal calculus    RENAL CALCULUS, HX OF 12/26/2006   Qualifier: Diagnosis of  By: Tanda Milling     S/P rotator cuff surgery 04/15/2014   Shoulder pain    Sinus bradycardia    Past Surgical History:  Procedure Laterality Date   COLONOSCOPY     CORONARY ARTERY BYPASS GRAFT N/A 11/23/2018   Procedure: CORONARY ARTERY BYPASS GRAFTING (CABG), ON PUMP, TIMES FOUR, LIMA to LAD, SEQ SVG to PDA, PLVB, SVG to CIRCUMFLEX, USING LEFT INTERNAL MAMMARY ARTERY AND RIGHT GREATER SAPHENOUS VEIN HARVESTED ENDOSCOPICALLY AND LEFT GREATER SAPHENOUS VEIN;  Surgeon: Army Dallas NOVAK, MD;  Location: [facility] OR;  Service: Open Heart Surgery;  Laterality: N/A;   CYSTOSCOPY/URETEROSCOPY/HOLMIUM LASER/STENT PLACEMENT Left 08/22/2022   Procedure: CYSTOSCOPY/LEFT RETROGRADE PYELOGRAM/ LEFT URETEROSCOPY/HOLMIUM LASER/LEFT URETERAL STENT PLACEMENT;  Surgeon: Renda Glance, MD;  Location: [facility];  Service: Urology;  Laterality: Left;  45 MINUTES NEEDED FOR CASE   LEFT HEART CATH AND CORONARY ANGIOGRAPHY N/A 11/18/2018   Procedure: LEFT HEART CATH AND CORONARY ANGIOGRAPHY;  Surgeon: Verlin Lonni BIRCH, MD;  Location: [facility];  Service: Cardiovascular;  Laterality: N/A;   NASAL SEPTOPLASTY W/ TURBINOPLASTY Bilateral 10/27/2023   Procedure: SEPTOPLASTY, NOSE, WITH NASAL TURBINATE REDUCTION;  Surgeon: Karis Clunes, MD;  Location: [facility];  Service: ENT;  Laterality: Bilateral;   None     SHOULDER SURGERY Left 03/29/2014   TEE WITHOUT CARDIOVERSION N/A 11/23/2018   Procedure: TRANSESOPHAGEAL ECHOCARDIOGRAM (TEE);  Surgeon: Army Dallas NOVAK, MD;  Location: [facility];  Service: Open Heart Surgery;  Laterality: N/A;   Family History  Problem Relation Age of Onset   Heart attack Father 45   Valvular heart disease Mother 38   Lymphoma Sister        Deceased   Breast cancer Sister        Alive   Colon cancer  Neg Hx    Esophageal cancer Neg Hx    Rectal cancer Neg Hx    Stomach cancer Neg Hx    Social History   Socioeconomic History   Marital status: Married    Spouse name: brenda   Number of children: 1   Years of education: 12   Highest education level: High school graduate  Occupational History   Occupation: HVAC with air quality    Employer: [employer]  Tobacco Use   Smoking status: Former    Current packs/day: 0.00    Average packs/day: 0.5 packs/day for 20.0 years (10.0 ttl pk-yrs)    Types: Cigarettes    Start date: 02/12/1964    Quit date: 02/12/1984    Years since quitting: 39.8   Smokeless tobacco: Never  Vaping Use   Vaping status: Never Used  Substance and Sexual Activity   Alcohol use: No    Alcohol/week: 0.0 standard drinks of alcohol    Comment: social use   Drug use: No   Sexual activity: Not on file  Other Topics Concern   Not on file  Social History Narrative   Not on file   Social Drivers of Health   Financial Resource Strain: Low Risk  (12/16/2022)   Overall Financial Resource Strain (CARDIA)    Difficulty of Paying Living Expenses: Not hard at all  Food Insecurity: No Food Insecurity (12/22/2023)   Hunger Vital Sign    Worried About Running Out of Food in the Last Year: Never true    Ran Out of Food in the Last Year: Never true  Transportation Needs: No Transportation Needs (12/22/2023)   PRAPARE - Administrator, Civil Service (Medical): No    Lack of Transportation (Non-Medical): No  Physical Activity: Sufficiently Active (12/22/2023)   Exercise Vital Sign    Days of Exercise per Week: 4 days    Minutes of Exercise per Session: 60 min  Stress: No Stress Concern Present (12/22/2023)   [employer] of Occupational Health - Occupational Stress Questionnaire    Feeling of Stress: Not at all  Social Connections: Moderately Integrated (12/22/2023)   Social Connection and Isolation Panel    Frequency of Communication with  Friends and Family: More than three times a week    Frequency of Social Gatherings with Friends and Family: More than three times a week    Attends Religious Services: More than 4 times per year    Active Member of [employer] or Organizations: No    Attends Banker Meetings: Never    Marital Status: Married  Catering Manager Violence: Not At Risk (12/22/2023)   Humiliation, Afraid, Rape, and Kick questionnaire    Fear of Current or Ex-Partner: No    Emotionally Abused: No    Physically Abused: No    Sexually Abused: No       Objective:  There were no vitals taken for this visit. {  Pulm Vitals (Optional):32837}  Physical Exam  Diagnostic Review:  {Labs (Optional):32838}  PFT 11/2018 FVC  4.1/94% FEV1 3.3/103% F/F     80% TLC    88% RV 86%   DLCO 19.8/77%    Cxr 11/2021  normal hyperinflated lungs  Assessment & Plan:   Assessment & Plan    No follow-ups on file.    Marny Patch, MD Pulmonary and Critical Care Medicine [facility]

## 2023-12-24 ENCOUNTER — Ambulatory Visit

## 2023-12-24 VITALS — BP 142/82 | HR 53 | Temp 98.0°F | Ht 70.0 in | Wt 148.8 lb

## 2023-12-24 DIAGNOSIS — Z87891 Personal history of nicotine dependence: Secondary | ICD-10-CM | POA: Diagnosis not present

## 2023-12-24 DIAGNOSIS — J4540 Moderate persistent asthma, uncomplicated: Secondary | ICD-10-CM

## 2023-12-24 LAB — POCT EXHALED NITRIC OXIDE: FeNO level (ppb): 69

## 2023-12-24 MED ORDER — FLUTICASONE-SALMETEROL 115-21 MCG/ACT IN AERO: 2.0000 | INHALATION_SPRAY | Freq: Two times a day (BID) | RESPIRATORY_TRACT | 6 refills | Status: AC

## 2023-12-24 MED ORDER — ALBUTEROL SULFATE HFA 108 (90 BASE) MCG/ACT IN AERS: 1.0000 | INHALATION_SPRAY | Freq: Four times a day (QID) | RESPIRATORY_TRACT | 6 refills | Status: AC | PRN

## 2023-12-24 NOTE — Patient Instructions (Addendum)
 Dear [name];  I think your shortness of breath is likely related to possible asthma. I recommend the following:  -Advair, maintenance therapy, 2 puffs twice a day. -Albuterol  as need it , 1-2 puffs every 4-6 hours, for shortness of breath or wheezing.  -Pulmonary function test at the next appointment.  -I will see you in 3 months.  INHALER INSTRUCTIONS: To use the inhaler you follow these steps: Prime the inhaler according to instructions (which means waste between 1-4 doses before next use).  Follow package insert Shake the inhaler before each use Exhale completely by blowing all the air out of your lungs Seal your mouth around the inhaler Press down on the canister then inhale SLOW and STEADY until your fill your lungs completely. Hold the breath for 6-10 seconds Gently exhale Wait 60 seconds then repeat steps 2-8. Rinse the mouth after each use to prevent thrush  Your pharmacist can also review proper technique if you have any remaining questions. Let me know if the cost is too high, your insurance may be able to recommend a more affordable option for you.

## 2024-01-20 ENCOUNTER — Encounter (INDEPENDENT_AMBULATORY_CARE_PROVIDER_SITE_OTHER): Payer: Self-pay | Admitting: Otolaryngology

## 2024-01-20 ENCOUNTER — Ambulatory Visit (INDEPENDENT_AMBULATORY_CARE_PROVIDER_SITE_OTHER): Admitting: Otolaryngology

## 2024-01-20 VITALS — BP 143/81 | HR 52 | Ht 70.0 in | Wt 155.0 lb

## 2024-01-20 DIAGNOSIS — J383 Other diseases of vocal cords: Secondary | ICD-10-CM

## 2024-01-20 DIAGNOSIS — J385 Laryngeal spasm: Secondary | ICD-10-CM | POA: Diagnosis not present

## 2024-01-20 DIAGNOSIS — R490 Dysphonia: Secondary | ICD-10-CM | POA: Diagnosis not present

## 2024-01-20 DIAGNOSIS — R498 Other voice and resonance disorders: Secondary | ICD-10-CM

## 2024-01-20 NOTE — Patient Instructions (Signed)
 Jeff Graves

## 2024-01-20 NOTE — Progress Notes (Signed)
 Dear Dr. Kennyth, Here is my assessment for our mutual patient, Jeff Graves. Thank you for allowing me the opportunity to care for your patient. Please do not hesitate to contact me should you have any other questions. Sincerely, Dr. Eldora Blanch  Otolaryngology Clinic Note Referring provider: Dr. Kennyth HPI:  Jeff Graves is a 78 y.o. male kindly referred by Dr. Kennyth for evaluation of dysphonia  Initial visit (01/2024): Discussed the use of AI scribe software for clinical note transcription with the patient, who gave verbal consent to proceed.  History of Present Illness Jeff Graves is a 78 year old male who presents with progressive hoarseness and voice changes over the past 3 to 6 months.  He notes gradual worsening of his voice, described as a whisper that fatigues and deteriorates with use, with only brief mild improvement. no complete aphonia. Symptoms began 3 to 6 months ago without preceding illness, trauma, or intubation. He had nasal surgery before onset but no prior voice-specific treatment.  He denies odynophagia, weight loss, otalgia, neck masses, cough, or current dyspnea. 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 Kennyth (12/04/2023): Persistent cough for 4 months, rhonchi, cleared with cough; ENT visit prior  without anytnig else; check CXR, ref to Pulm Dr. Adrien Guan (12/24/2023) Pulm review: chronic cough, mostly in AM and PM with SOB during coughing spells with wheezing, SOB, likely adult onset asthma; Dx: Asthma; Rx: PFT, RAST, advair, f/u 3 months CXR 12/01/2023 independently interpreted: no consolidation, appears to have some hyperinflation CBC and BMP (2024): BUN/Cr 22/1.39; WBC 6.9; Eos (2019): 400 PMH/Meds/All/SocHx/FamHx/ROS:   Past Medical History:  Diagnosis Date   A-fib [facility])    Episode after surgery   Allergy    seasonal   Atrial tachycardia 06/04/2012   12/13-QRS duration-102 ms 6/14-QRS duration-98 ms    CAD (coronary artery disease)    a. s/p CABG 11/2018   CKD (chronic kidney disease), stage II    DDD (degenerative disc disease)    Diverticulosis of colon    Hemorrhoids    History of kidney stones    Hypercholesterolemia    Hypertension    NSVT (nonsustained ventricular tachycardia) (HCC)    PAC (premature atrial contraction)    Pneumothorax    Postoperative atrial fibrillation (HCC)    Renal calculus    RENAL CALCULUS, HX OF 12/26/2006   Qualifier: Diagnosis of  By: Tanda Milling     S/P rotator cuff surgery 04/15/2014   Shoulder pain    Sinus bradycardia      Past Surgical History:  Procedure Laterality Date   COLONOSCOPY     CORONARY ARTERY BYPASS GRAFT N/A 11/23/2018   Procedure: CORONARY ARTERY BYPASS GRAFTING (CABG), ON PUMP, TIMES FOUR, LIMA to LAD, SEQ SVG to PDA, PLVB, SVG to CIRCUMFLEX, USING LEFT INTERNAL MAMMARY ARTERY AND RIGHT GREATER SAPHENOUS VEIN HARVESTED ENDOSCOPICALLY AND LEFT GREATER SAPHENOUS  VEIN;  Surgeon: Army Dallas NOVAK, MD;  [facility] OR;  Service: Open Heart Surgery;  Laterality: N/A;   CYSTOSCOPY/URETEROSCOPY/HOLMIUM LASER/STENT PLACEMENT Left 08/22/2022   Procedure: CYSTOSCOPY/LEFT RETROGRADE PYELOGRAM/ LEFT URETEROSCOPY/HOLMIUM LASER/LEFT URETERAL STENT PLACEMENT;  Surgeon: Renda Glance, MD;  Location: [facility] ORS;   Service: Urology;  Laterality: Left;  45 MINUTES NEEDED FOR CASE   LEFT HEART CATH AND CORONARY ANGIOGRAPHY N/A 11/18/2018   Procedure: LEFT HEART CATH AND CORONARY ANGIOGRAPHY;  Surgeon: Verlin Lonni BIRCH, MD;  Location: [facility] INVASIVE CV LAB;  Service: Cardiovascular;  Laterality: N/A;   NASAL SEPTOPLASTY W/ TURBINOPLASTY Bilateral 10/27/2023   Procedure: SEPTOPLASTY, NOSE, WITH NASAL TURBINATE REDUCTION;  Surgeon: Karis Clunes, MD;  Location: [facility];  Service: ENT;  Laterality: Bilateral;   None     SHOULDER SURGERY Left 03/29/2014   TEE WITHOUT CARDIOVERSION N/A 11/23/2018   Procedure: TRANSESOPHAGEAL ECHOCARDIOGRAM (TEE);  Surgeon: Army Dallas NOVAK, MD;  Location: [facility] OR;  Service: Open Heart Surgery;  Laterality: N/A;    Family History  Problem Relation Age of Onset   Heart attack Father 33   Valvular heart disease Mother 48   Lymphoma Sister        Deceased   Breast cancer Sister        Alive   Colon cancer Neg Hx    Esophageal cancer Neg Hx    Rectal cancer Neg Hx    Stomach cancer Neg Hx      Social Connections: Moderately Integrated (12/22/2023)   Social Connection and Isolation Panel    Frequency of Communication with Friends and Family: More than three times a week    Frequency of Social Gatherings with Friends and Family: More than three times a week    Attends Religious Services: More than 4 times per year    Active Member of [employer] or Organizations: No    Attends Engineer, Structural: Never    Marital Status: Married      Current Outpatient Medications:    albuterol  (VENTOLIN  HFA) 108 (90 Base) MCG/ACT inhaler, Inhale 1-2 puffs into the lungs every 6 (six) hours as needed for wheezing or shortness of breath., Disp: 8 g, Rfl: 6   amLODipine  (NORVASC ) 5 MG tablet, TAKE 1 TABLET BY MOUTH DAILY, Disp: 90 tablet, Rfl: 3   atorvastatin  (LIPITOR ) 80 MG tablet, TAKE 1 TABLET BY MOUTH DAILY, Disp: 90 tablet, Rfl: 3   Cholecalciferol (VITAMIN D )  2000 units CAPS, Take 2,000 Units by mouth daily., Disp: , Rfl:    Cyanocobalamin (VITAMIN B-12) 5000 MCG LOZG, Take 5,000 mcg by mouth daily. , Disp: , Rfl:    fluticasone -salmeterol (ADVAIR HFA) 115-21 MCG/ACT inhaler, Inhale 2 puffs into the lungs 2 (two) times daily., Disp: 1 each, Rfl: 6   metoprolol  tartrate (LOPRESSOR ) 25 MG tablet, Take 1 tablet (25 mg total) by mouth 2 (two) times daily., Disp: 180 tablet, Rfl: 0   Multiple Vitamins-Minerals (MULTIVITAMIN WITH MINERALS) tablet, Take 1 tablet by mouth daily., Disp: , Rfl:    Physical Exam:   BP (!) 143/81 (BP Location: Left Arm, Patient Position: Sitting, Cuff Size: Large)   Pulse (!) 52   Ht 5' 10 (1.778 m)   Wt 155 lb (70.3 kg)   SpO2 97%   BMI 22.24 kg/m   Salient findings:  CN II-XII intact  Bilateral EAC clear and TM intact with well pneumatized middle ear spaces Anterior rhinoscopy: Septum with 1 cm perforation caudally bilateral inferior turbinates partially resected No  lesions of oral cavity/oropharynx No obviously palpable neck masses/lymphadenopathy/thyromegaly No respiratory distress or stridor; voice quality class 3 - some harshness but periods of more clear voice; Stroboscopy was indicated to better evaluate the proximal airway, given the patient's history and exam findings, and is detailed below.   Seprately Identifiable Procedures:  Prior to initiating any procedures, risks/benefits/alternatives were explained to the patient and verbal consent obtained. Procedure Note Pre-procedure diagnosis:  Dysphonia Post-procedure diagnosis: Same Procedure: Video Laryngostroboscopy; CPT [phone] - Mod 25 Indication: See pre-procedure diagnosis:  Date: 01/20/24   The procedure was undertaken to further evaluate the patient's complaint above; a transnasal video laryngostroboscopy (VLS) was performed to closely evaluate the patient's laryngeal biomechanics and vocal fold oscillation.  Patient was identified as correct patient.  Verbal consent was obtained. The nose was sprayed bilaterally as below. The flexible scope was passed through the nose to view the nasal cavity, pharynx (oropharynx, hypopharynx) and larynx.  The larynx was examined at rest and during multiple phonatory tasks. Documentation was obtained and reviewed with patient. The scope was removed. The patient tolerated the procedure well.  VLS Exam Detail:  Amplitude - Left: normal Amplitude - Right: normal  Mucosal Wave - Left: normal Mucosal Wave - Right: normal  Vocal Fold Motion - Left: No motion abnormalities Vocal Fold Motion - Right: No motion abnormalities  Vertical Level of Approximation: equal Glottic Closure: Hourglass pattern with ~1-2 mm gap  Phase Symmetry: symmetric Periodicity (Regularity): regular  Supraglottic Hyperfunction: mixed Nasopharynx: Bilateral tori wnl, no masses or lesions  Airway: Widely Patent Additional Observations: Clear AP- compression supraglottis   Exam Summary: Examiner: Eldora Blanch, MD Diagnostician: Eldora Blanch, MD  Anesthesia: Yes, Topical (oxymetazoline  and 4% lidocaine ) Sensitivity to Scope: modest   Summary of Findings: The videostroboscopic exam revealed A-P and lateral compression of the supraglottis with phonation. There were no masses, lesions, or vocal fold motion abnormalities on exam.   Electronically signed by: Eldora KATHEE Blanch, MD 01/20/2024 12:42 PM   Impression & Plans:  Jeff Graves is a 78 y.o. male with:  1. Dysphonia   2. Laryngeal spasm   3. Muscle tension dysphonia   4. Presbyphonia   5. Glottic insufficiency   6. Vocal fold atrophy    Most likely combination of atrophy and now MTD. We discussed options. He opted for increased hydration and voice therapy to start. If no improvement and still having significant issues, can consider injection augmentation - likely fat  - f/u 3 months, sooner as necessary  See below regarding exact medications prescribed this encounter including  dosages and route: No orders of the defined types were placed in this encounter.     Thank you for allowing me the opportunity to care for your patient. Please do not hesitate to contact me should you have any other questions.  Sincerely, Eldora Blanch, MD Otolaryngologist (ENT), [facility] Phone: [phone] Fax: [phone]  01/20/2024, 12:42 PM   MDM:  Level 4 Complexity/Problems addressed: mod - chronic problem, worsening Data complexity: mod - independent review of note, labs; independent imaging interpretation - Morbidity: low  - Prescription Drug prescribed or managed: n

## 2024-02-09 NOTE — Therapy (Signed)
 " OUTPATIENT SPEECH LANGUAGE PATHOLOGY VOICE EVALUATION   Patient Name: Jeff Graves MRN: [id number] DOB:04/26/1945, 78 y.o., male Today's Date: 02/10/2024  PCP: Kennyth Bart MD REFERRING PROVIDER: Tobie Comp, MD  END OF SESSION:  End of Session - 02/10/24 2040     Visit Number 1    Number of Visits 17    Date for Recertification  04/09/24    SLP Start Time 0936    SLP Stop Time  1017    SLP Time Calculation (min) 41 min    Activity Tolerance Patient tolerated treatment well          Past Medical History:  Diagnosis Date   A-fib [facility])    Episode after surgery   Allergy    seasonal   Atrial tachycardia 06/04/2012   12/13-QRS duration-102 ms 6/14-QRS duration-98 ms    CAD (coronary artery disease)    a. s/p CABG 11/2018   CKD (chronic kidney disease), stage II    DDD (degenerative disc disease)    Diverticulosis of colon    Hemorrhoids    History of kidney stones    Hypercholesterolemia    Hypertension    NSVT (nonsustained ventricular tachycardia) (HCC)    PAC (premature atrial contraction)    Pneumothorax    Postoperative atrial fibrillation (HCC)    Renal calculus    RENAL CALCULUS, HX OF 12/26/2006   Qualifier: Diagnosis of  By: Tanda Milling     S/P rotator cuff surgery 04/15/2014   Shoulder pain    Sinus bradycardia    Past Surgical History:  Procedure Laterality Date   COLONOSCOPY     CORONARY ARTERY BYPASS GRAFT N/A 11/23/2018   Procedure: CORONARY ARTERY BYPASS GRAFTING (CABG), ON PUMP, TIMES FOUR, LIMA to LAD, SEQ SVG to PDA, PLVB, SVG to CIRCUMFLEX, USING LEFT INTERNAL MAMMARY ARTERY AND RIGHT GREATER SAPHENOUS VEIN HARVESTED ENDOSCOPICALLY AND LEFT GREATER SAPHENOUS VEIN;  Surgeon: Army Dallas NOVAK, MD;  Location: [facility] OR;  Service: Open Heart Surgery;  Laterality: N/A;   CYSTOSCOPY/URETEROSCOPY/HOLMIUM LASER/STENT PLACEMENT Left 08/22/2022   Procedure: CYSTOSCOPY/LEFT RETROGRADE PYELOGRAM/ LEFT URETEROSCOPY/HOLMIUM LASER/LEFT URETERAL STENT  PLACEMENT;  Surgeon: Renda Glance, MD;  Location: [facility]S;  Service: Urology;  Laterality: Left;  45 MINUTES NEEDED FOR CASE   LEFT HEART CATH AND CORONARY ANGIOGRAPHY N/A 11/18/2018   Procedure: LEFT HEART CATH AND CORONARY ANGIOGRAPHY;  Surgeon: Verlin Lonni BIRCH, MD;  Location: [facility] LAB;  Service: Cardiovascular;  Laterality: N/A;   NASAL SEPTOPLASTY W/ TURBINOPLASTY Bilateral 10/27/2023   Procedure: SEPTOPLASTY, NOSE, WITH NASAL TURBINATE REDUCTION;  Surgeon: Karis Clunes, MD;  Location: [facility];  Service: ENT;  Laterality: Bilateral;   None     SHOULDER SURGERY Left 03/29/2014   TEE WITHOUT CARDIOVERSION N/A 11/23/2018   Procedure: TRANSESOPHAGEAL ECHOCARDIOGRAM (TEE);  Surgeon: Army Dallas NOVAK, MD;  Location: [facility] OR;  Service: Open Heart Surgery;  Laterality: N/A;   Patient Active Problem List   Diagnosis Date Noted   Heart failure, unspecified HF chronicity, unspecified heart failure type (HCC) 12/04/2023   Impacted cerumen of both ears 05/16/2023   Chronic rhinitis 04/12/2023   Deviated nasal septum 04/12/2023   Hypertrophy of nasal turbinates 04/12/2023   Lightheadedness - likely neurally mediated 06/18/2022   Sinus bradycardia 06/10/2021   Degenerative disc disease, lumbar 02/16/2021   Hyperglycemia 02/16/2021   Dyslipidemia 02/16/2021   Former smoker 02/15/2020   S/P CABG x 4 11/24/2018   CKD (chronic kidney disease), stage III 11/20/2018  Coronary artery disease involving native coronary artery of native heart with angina pectoris    Cervical disc disorder with radiculopathy of cervical region 01/22/2013   Atrial tachycardia 06/04/2012   Essential hypertension 12/26/2006    Onset date: June-September 2025; script 01-20-24  REFERRING DIAG:  R49.0 (ICD-10-CM) - Dysphonia  J38.5 (ICD-10-CM) - Laryngeal spasm  R49.0 (ICD-10-CM) - Muscle tension dysphonia    THERAPY DIAG:  Voice hoarseness - Plan: SLP plan of care cert/re-cert  Rationale  for Evaluation and Treatment: Rehabilitation  SUBJECTIVE:   SUBJECTIVE STATEMENT: It got worse after nasal surgery.  Pt accompanied by: self  PERTINENT HISTORY: see above for PMH From [place], January 20, 2024:  He notes gradual worsening of his voice, described as a whisper that fatigues and deteriorates with use, with only brief mild improvement. no complete aphonia. Symptoms began 3 to 6 months ago without preceding illness, trauma, or intubation.  PAIN:  Are you having pain? No  FALLS: Has patient fallen in last 6 months? No,   LIVING ENVIRONMENT: Lives with: lives with their spouse Lives in: House/apartment  PLOF:Level of assistance: Independent with ADLs, Independent with IADLs Employment: Other: retired  PATIENT GOALS: improve voice  OBJECTIVE:  Note: Objective measures were completed at Evaluation unless otherwise noted.  DIAGNOSTIC FINDINGS:  Tobie 01/20/24: VLS Exam Detail:         Amplitude - Left: normal Amplitude - Right: normal  Mucosal Wave - Left: normal Mucosal Wave - Right: normal  Vocal Fold Motion - Left: No motion abnormalities Vocal Fold Motion - Right: No motion abnormalities  Vertical Level of Approximation: equal Glottic Closure: Hourglass pattern with ~1-2 mm gap  Phase Symmetry: symmetric Periodicity (Regularity): regular  Supraglottic Hyperfunction: mixed Nasopharynx: Bilateral tori wnl, no masses or lesions  Airway: Widely Patent Additional Observations: Clear AP- compression supraglottis    Exam Summary: Examiner: Eldora Tobie, MD Diagnostician: Eldora Tobie, MD  Anesthesia: Yes, Topical (oxymetazoline  and 4% lidocaine ) Sensitivity to Scope: modest    Summary of Findings: The videostroboscopic exam revealed A-P and lateral compression of the supraglottis with phonation. There were no masses, lesions, or vocal fold motion abnormalities on exam.   Jeff Graves is a 78 y.o. male with:   1. Dysphonia   2. Laryngeal spasm   3. Muscle  tension dysphonia   4. Presbyphonia   5. Glottic insufficiency   6. Vocal fold atrophy     Most likely combination of atrophy and now MTD. We discussed options. He opted for increased hydration and voice therapy to start. If no improvement and still having significant issues, can consider injection augmentation - likely fat  COGNITION: Overall cognitive status: Within functional limits for tasks assessed  SOCIAL HISTORY: Occupation: retired Counsellor intake: suboptimal - approx 25 oz every day Caffeine/alcohol intake: moderate - approx 32 oz every day  Daily voice use: minimal  PERCEPTUAL VOICE ASSESSMENT: Voice quality: breathy, harsh, and strained Vocal abuse: abnormal breathing pattern and habitual loudness Resonance: normal Respiratory function: thoracic breathing  OBJECTIVE VOICE ASSESSMENT: Maximum phonation time for sustained ah: 3.4 seconds (significantly below WNL) Conversational pitch average: 169 Hz Conversational pitch range: 155-207 Hz Conversational loudness average: 73 dB Conversational loudness range: 50-84 dB S/z ratio:  (Suggestive of dysfunction >1.0)  PATIENT REPORTED OUTCOME MEASURES (PROM): The Voice Handicap Index-10 (VHI-10) was administered. This survey is a series of questions targeting the patient's perception of his/her own voice using a scale of 0-4 (0=Never, 4=Always). Score greater than 11 is abnormal. My voice makes  it difficult for people to hear me. 3  People have difficulty understanding me in a noisy room. 4  My voice difficulties restrict personal and social life. 3 I feel left out of conversations because of my voice. 3  My voice problem causes me to lose income. 0  I feel as though I have to strain to produce voice. 3  The clarity of my voice is unpredictable. 4  My voice problem upsets me. 4  My voice makes me feel handicapped. 4  People ask, What's wrong with your voice? 3  TOTAL SCORE:  31/40 , where >11 is abnormal                                                                                                                             TREATMENT DATE:  SOVTE=Semi-occluded vocal tract exercises, RVT=Resonant voice therapy, MTD=Muscle tension dysphonia  02/10/24: SLP attempted use of SOVTE and RVT to decr pt's s/sx MTD and improve pt's voice quality. Nothing today was successful completely, pt did not indicate he felt any more resonance in oral or nasal cavity with forward focus for /m/ or /u/.    PATIENT EDUCATION: Education details: see above-procedure for SOVTE and RVT tasks  Person educated: Patient Education method: Explanation, Demonstration, and Verbal cues Education comprehension: verbalized understanding, returned demonstration, verbal cues required, and needs further education  HOME EXERCISE PROGRAM: TBD after first scheduled therapy session.  GOALS: Goals reviewed with patient? Yes  SHORT TERM GOALS: Target date: 03/12/24  Pt will complete voice exercises with 70% WNL voice in 2 sessions with rare min A Baseline: Goal status: INITIAL   2.  Pt will complete VHI-10 with score </= 20/40 Baseline:  Goal status: INITIAL   3.  Pt will incr water intake to 60oz, and decr caffeine intake to 12 oz between two sessions Baseline:  Goal status: INITIAL   4.  Pt will demo AB in 16/20 sentence responses Baseline:  Goal status: INITIAL     LONG TERM GOALS: Target date: 03/2724   Pt will improve VHI-10 to </= 11/40 Baseline:  Goal status: INITIAL   Pt will complete PhoRTE independently in 2 sessions Baseline: Goal status: INITIAL   3.  Pt will incr water intake to > 60oz/day and decr caffeine intake to 12 oz/daybetween four total sessions Baseline:  Goal status: INITIAL   4.  Pt will demo AB 75% in 8 minutes conversation Baseline:  Goal status: INITIAL   5.  Pt will demo WNL voice quality in 8 minutes conversation in 2 sessions             Baseline:             Goal status:  INITIAL  ASSESSMENT:  CLINICAL IMPRESSION: Patient is a 78 y.o. M who was seen today for assessment of voice quality in light of dx'd vocal fold atrophy, muscle tension dysphonia, and glottal insufficiency. Pt, as discovered by VHI-10, is very  concerned about his voice quality and it has had a negative affect on his QoL. Voice therapy is recommended due to severity of symptoms and current impact of dysphonia on pt's QoL. Without voice therapy, pt is at risk of reduced social engagement due to communication limitations from his voice disorder.   OBJECTIVE IMPAIRMENTS: include voice disorder. These impairments are limiting patient from household responsibilities, ADLs/IADLs, and effectively communicating at home and in community. Factors affecting potential to achieve goals and functional outcome are severity of impairments.. Patient will benefit from skilled SLP services to address above impairments and improve overall function.  REHAB POTENTIAL: Good  PLAN:  SLP FREQUENCY: 1-2x/week  SLP DURATION: 8 weeks  PLANNED INTERVENTIONS: Environmental controls, Internal/external aids, Functional tasks, SLP instruction and feedback, Compensatory strategies, Patient/family education, [phone] Treatment of speech (30 or 45 min) , and voice exercises    Tyvon Eggenberger, CCC-SLP 02/10/2024, 8:41 PM      "

## 2024-02-10 ENCOUNTER — Ambulatory Visit: Attending: Otolaryngology

## 2024-02-10 ENCOUNTER — Other Ambulatory Visit: Payer: Self-pay

## 2024-02-10 DIAGNOSIS — J385 Laryngeal spasm: Secondary | ICD-10-CM | POA: Diagnosis not present

## 2024-02-10 DIAGNOSIS — R490 Dysphonia: Secondary | ICD-10-CM | POA: Insufficient documentation

## 2024-02-19 ENCOUNTER — Ambulatory Visit: Attending: Otolaryngology

## 2024-02-19 DIAGNOSIS — R490 Dysphonia: Secondary | ICD-10-CM | POA: Diagnosis present

## 2024-02-19 NOTE — Therapy (Signed)
 " OUTPATIENT SPEECH LANGUAGE PATHOLOGY VOICE TREATMENT   Patient Name: Jeff Graves MRN: [id number] DOB:01-28-1946, 79 y.o., male Today's Date: 02/19/2024  PCP: Kennyth Bart MD REFERRING PROVIDER: Tobie Comp, MD  END OF SESSION:  End of Session - 02/19/24 1017     Visit Number 2    Number of Visits 17    Date for Recertification  04/09/24    SLP Start Time 0934    SLP Stop Time  1015    SLP Time Calculation (min) 41 min    Activity Tolerance Patient tolerated treatment well          Past Medical History:  Diagnosis Date   A-fib [facility])    Episode after surgery   Allergy    seasonal   Atrial tachycardia 06/04/2012   12/13-QRS duration-102 ms 6/14-QRS duration-98 ms    CAD (coronary artery disease)    a. s/p CABG 11/2018   CKD (chronic kidney disease), stage II    DDD (degenerative disc disease)    Diverticulosis of colon    Hemorrhoids    History of kidney stones    Hypercholesterolemia    Hypertension    NSVT (nonsustained ventricular tachycardia) (HCC)    PAC (premature atrial contraction)    Pneumothorax    Postoperative atrial fibrillation (HCC)    Renal calculus    RENAL CALCULUS, HX OF 12/26/2006   Qualifier: Diagnosis of  By: Tanda Milling     S/P rotator cuff surgery 04/15/2014   Shoulder pain    Sinus bradycardia    Past Surgical History:  Procedure Laterality Date   COLONOSCOPY     CORONARY ARTERY BYPASS GRAFT N/A 11/23/2018   Procedure: CORONARY ARTERY BYPASS GRAFTING (CABG), ON PUMP, TIMES FOUR, LIMA to LAD, SEQ SVG to PDA, PLVB, SVG to CIRCUMFLEX, USING LEFT INTERNAL MAMMARY ARTERY AND RIGHT GREATER SAPHENOUS VEIN HARVESTED ENDOSCOPICALLY AND LEFT GREATER SAPHENOUS VEIN;  Surgeon: Army Dallas NOVAK, MD;  Location: [facility] OR;  Service: Open Heart Surgery;  Laterality: N/A;   CYSTOSCOPY/URETEROSCOPY/HOLMIUM LASER/STENT PLACEMENT Left 08/22/2022   Procedure: CYSTOSCOPY/LEFT RETROGRADE PYELOGRAM/ LEFT URETEROSCOPY/HOLMIUM LASER/LEFT URETERAL STENT  PLACEMENT;  Surgeon: Renda Glance, MD;  Location: [facility] ORS;  Service: Urology;  Laterality: Left;  45 MINUTES NEEDED FOR CASE   LEFT HEART CATH AND CORONARY ANGIOGRAPHY N/A 11/18/2018   Procedure: LEFT HEART CATH AND CORONARY ANGIOGRAPHY;  Surgeon: Verlin Lonni BIRCH, MD;  Location: [facility] INVASIVE CV LAB;  Service: Cardiovascular;  Laterality: N/A;   NASAL SEPTOPLASTY W/ TURBINOPLASTY Bilateral 10/27/2023   Procedure: SEPTOPLASTY, NOSE, WITH NASAL TURBINATE REDUCTION;  Surgeon: Karis Clunes, MD;  Location: [facility];  Service: ENT;  Laterality: Bilateral;   None     SHOULDER SURGERY Left 03/29/2014   TEE WITHOUT CARDIOVERSION N/A 11/23/2018   Procedure: TRANSESOPHAGEAL ECHOCARDIOGRAM (TEE);  Surgeon: Army Dallas NOVAK, MD;  Location: [facility];  Service: Open Heart Surgery;  Laterality: N/A;   Patient Active Problem List   Diagnosis Date Noted   Heart failure, unspecified HF chronicity, unspecified heart failure type (HCC) 12/04/2023   Impacted cerumen of both ears 05/16/2023   Chronic rhinitis 04/12/2023   Deviated nasal septum 04/12/2023   Hypertrophy of nasal turbinates 04/12/2023   Lightheadedness - likely neurally mediated 06/18/2022   Sinus bradycardia 06/10/2021   Degenerative disc disease, lumbar 02/16/2021   Hyperglycemia 02/16/2021   Dyslipidemia 02/16/2021   Former smoker 02/15/2020   S/P CABG x 4 11/24/2018   CKD (chronic kidney disease), stage III 11/20/2018  Coronary artery disease involving native coronary artery of native heart with angina pectoris    Cervical disc disorder with radiculopathy of cervical region 01/22/2013   Atrial tachycardia 06/04/2012   Essential hypertension 12/26/2006    Onset date: June-September 2025; script 01-20-24  REFERRING DIAG:  R49.0 (ICD-10-CM) - Dysphonia  J38.5 (ICD-10-CM) - Laryngeal spasm  R49.0 (ICD-10-CM) - Muscle tension dysphonia    THERAPY DIAG:  Voice hoarseness  Rationale for Evaluation and Treatment:  Rehabilitation  SUBJECTIVE:   SUBJECTIVE STATEMENT: It got worse after nasal surgery.  Pt accompanied by: self  PERTINENT HISTORY: see above for PMH From [place], January 20, 2024:  He notes gradual worsening of his voice, described as a whisper that fatigues and deteriorates with use, with only brief mild improvement. no complete aphonia. Symptoms began 3 to 6 months ago without preceding illness, trauma, or intubation.  PAIN:  Are you having pain? No  FALLS: Has patient fallen in last 6 months? No,   LIVING ENVIRONMENT: Lives with: lives with their spouse Lives in: House/apartment  PLOF:Level of assistance: Independent with ADLs, Independent with IADLs Employment: Other: retired  PATIENT GOALS: improve voice  OBJECTIVE:  Note: Objective measures were completed at Evaluation unless otherwise noted.  DIAGNOSTIC FINDINGS:  Tobie 01/20/24: VLS Exam Detail:         Amplitude - Left: normal Amplitude - Right: normal  Mucosal Wave - Left: normal Mucosal Wave - Right: normal  Vocal Fold Motion - Left: No motion abnormalities Vocal Fold Motion - Right: No motion abnormalities  Vertical Level of Approximation: equal Glottic Closure: Hourglass pattern with ~1-2 mm gap  Phase Symmetry: symmetric Periodicity (Regularity): regular  Supraglottic Hyperfunction: mixed Nasopharynx: Bilateral tori wnl, no masses or lesions  Airway: Widely Patent Additional Observations: Clear AP- compression supraglottis    Exam Summary: Examiner: Eldora Tobie, MD Diagnostician: Eldora Tobie, MD  Anesthesia: Yes, Topical (oxymetazoline  and 4% lidocaine ) Sensitivity to Scope: modest    Summary of Findings: The videostroboscopic exam revealed A-P and lateral compression of the supraglottis with phonation. There were no masses, lesions, or vocal fold motion abnormalities on exam.   Jeff Graves is a 79 y.o. male with:   1. Dysphonia   2. Laryngeal spasm   3. Muscle tension dysphonia   4.  Presbyphonia   5. Glottic insufficiency   6. Vocal fold atrophy     Most likely combination of atrophy and now MTD. We discussed options. He opted for increased hydration and voice therapy to start. If no improvement and still having significant issues, can consider injection augmentation - likely fat  COGNITION: Overall cognitive status: Within functional limits for tasks assessed  SOCIAL HISTORY: Occupation: retired Counsellor intake: suboptimal - approx 25 oz every day Caffeine/alcohol intake: moderate - approx 32 oz every day  Daily voice use: minimal  PERCEPTUAL VOICE ASSESSMENT: Voice quality: breathy, harsh, and strained Vocal abuse: abnormal breathing pattern and habitual loudness Resonance: normal Respiratory function: thoracic breathing  OBJECTIVE VOICE ASSESSMENT: Maximum phonation time for sustained ah: 3.4 seconds (significantly below WNL) Conversational pitch average: 169 Hz Conversational pitch range: 155-207 Hz Conversational loudness average: 73 dB Conversational loudness range: 50-84 dB S/z ratio:  (Suggestive of dysfunction >1.0)  PATIENT REPORTED OUTCOME MEASURES (PROM): The Voice Handicap Index-10 (VHI-10) was administered. This survey is a series of questions targeting the patient's perception of his/her own voice using a scale of 0-4 (0=Never, 4=Always). Score greater than 11 is abnormal. My voice makes it difficult for people to hear me.  3  People have difficulty understanding me in a noisy room. 4  My voice difficulties restrict personal and social life. 3 I feel left out of conversations because of my voice. 3  My voice problem causes me to lose income. 0  I feel as though I have to strain to produce voice. 3  The clarity of my voice is unpredictable. 4  My voice problem upsets me. 4  My voice makes me feel handicapped. 4  People ask, What's wrong with your voice? 3  TOTAL SCORE:  31/40 , where >11 is abnormal                                                                                                                             TREATMENT DATE:  SOVTE=Semi-occluded vocal tract exercises, RVT=Resonant voice therapy, MTD=Muscle tension dysphonia  02/19/24: SLP introduced respiratory retraining for optimizing airflow for balanced phonation. SLP taught pt how to utilize a diaphragmatic breath for increasing airflow for voice, especially in setting of VF atrophy. After SLP guidance, pt demonstrated diaphragmatic breathing with 75% consistency given occasional min A. SLP introduced modified Resonant Voice Therapy (RVT) focused on developing pt self-awareness of forward-focused voicing. SLP utilized initial /b/ words to establish pt self-awareness for RVT. Pt said initial /b/ words, achieving improved vocal quality in 80% of opportunities given frequent min A for diaphragmatic breathing. Pt's self-awareness of forward-focused resonance was benefited by describing sensations as pressure rather than vibrations. After pt said initial /m/ words, he displayed emerging understanding of vibration as a cue for forward-focused voicing. This will need reinforcement in upcoming sessions along with cue for pressure. SLP provided pt HEP focused on initial /b/ words for further practice. Plan is to continue RVT and SOVTEs for maximizing pt's vocal quality for QOL.   02/10/24: SLP attempted use of SOVTE and RVT to decr pt's s/sx MTD and improve pt's voice quality. Nothing today was successful completely, pt did not indicate he felt any more resonance in oral or nasal cavity with forward focus for /m/ or /u/.    PATIENT EDUCATION: Education details: see above-procedure for SOVTE and RVT tasks  Person educated: Patient Education method: Explanation, Demonstration, and Verbal cues Education comprehension: verbalized understanding, returned demonstration, verbal cues required, and needs further education  HOME EXERCISE PROGRAM: TBD after first scheduled therapy  session.  GOALS: Goals reviewed with patient? Yes  SHORT TERM GOALS: Target date: 03/12/24  Pt will complete voice exercises with 70% WNL voice in 2 sessions with rare min A Baseline: Goal status: INITIAL   2.  Pt will complete VHI-10 with score </= 20/40 Baseline:  Goal status: INITIAL   3.  Pt will incr water intake to 60oz, and decr caffeine intake to 12 oz between two sessions Baseline:  Goal status: INITIAL   4.  Pt will demo AB in 16/20 sentence responses Baseline:  Goal status: INITIAL     LONG TERM GOALS: Target date: 03/2724  Pt will improve VHI-10 to </= 11/40 Baseline:  Goal status: INITIAL   Pt will complete PhoRTE independently in 2 sessions Baseline: Goal status: INITIAL   3.  Pt will incr water intake to > 60oz/day and decr caffeine intake to 12 oz/daybetween four total sessions Baseline:  Goal status: INITIAL   4.  Pt will demo AB 75% in 8 minutes conversation Baseline:  Goal status: INITIAL   5.  Pt will demo WNL voice quality in 8 minutes conversation in 2 sessions             Baseline:             Goal status: INITIAL  ASSESSMENT:  CLINICAL IMPRESSION: Patient is a 79 y.o. M who was seen today for assessment of voice quality in light of dx'd vocal fold atrophy, muscle tension dysphonia, and glottal insufficiency. Pt, as discovered by VHI-10, is very concerned about his voice quality and it has had a negative affect on his QoL. Voice therapy is recommended due to severity of symptoms and current impact of dysphonia on pt's QoL. Without voice therapy, pt is at risk of reduced social engagement due to communication limitations from his voice disorder.   OBJECTIVE IMPAIRMENTS: include voice disorder. These impairments are limiting patient from household responsibilities, ADLs/IADLs, and effectively communicating at home and in community. Factors affecting potential to achieve goals and functional outcome are severity of impairments.. Patient will  benefit from skilled SLP services to address above impairments and improve overall function.  REHAB POTENTIAL: Good  PLAN:  SLP FREQUENCY: 1-2x/week  SLP DURATION: 8 weeks  PLANNED INTERVENTIONS: Environmental controls, Internal/external aids, Functional tasks, SLP instruction and feedback, Compensatory strategies, Patient/family education, [phone] Treatment of speech (30 or 45 min) , and voice exercises    Waddell Music, M.A., CF-SLP 02/19/2024, 2:47 PM      "

## 2024-02-24 ENCOUNTER — Telehealth: Payer: Self-pay | Admitting: Cardiovascular Disease

## 2024-02-24 NOTE — Telephone Encounter (Signed)
 I spoke with patient's wife. She reports since September patient has been depressed.  Reports he sits around and stares. His legs also jump and jerk at night.  Has seen medical providers and cause has not been determined.  Patient's wife reports it was suggested to her patient's symptoms could be related to Atorvastatin . She is asking if this medication can be stopped or changed.

## 2024-02-24 NOTE — Telephone Encounter (Signed)
 Pt c/o medication issue:  1. Name of Medication: atorvastatin  (LIPITOR ) 80 MG tablet   2. How are you currently taking this medication (dosage and times per day)?    3. Are you having a reaction (difficulty breathing--STAT)? no  4. What is your medication issue? Wife called in because she doesn't want the patient taking the medication. She feels that he is having side effects from it. Please advise

## 2024-02-24 NOTE — Telephone Encounter (Signed)
 Left message to call office

## 2024-02-25 NOTE — Telephone Encounter (Signed)
 Verlin Lonni BIRCH, MD to Velinda Avelina CROME, RN  [facility] Triage   02/25/24  9:52 AM I would stop the Lipitor  for now and see how he responds to this change before adjusting lipid therapy. [place] Fortis patient's wife back (DPR) about message. She will stop his lipitor  and see if that helps. She will call if that is the issue and then we will take off his list and see what else Dr. Verlin wants patient to take for his cholesterol.

## 2024-02-25 NOTE — Telephone Encounter (Signed)
 Wife states she is returning call. Please advise

## 2024-02-25 NOTE — Telephone Encounter (Signed)
 Returned call. Spoke to patient himself. Stated he did not call and was not having problems with his medication. Confirmed pts name and DOB again. Apologized for the call.

## 2024-02-26 ENCOUNTER — Ambulatory Visit

## 2024-02-26 DIAGNOSIS — R490 Dysphonia: Secondary | ICD-10-CM

## 2024-02-26 NOTE — Therapy (Signed)
 " OUTPATIENT SPEECH LANGUAGE PATHOLOGY VOICE TREATMENT   Patient Name: Jeff Graves MRN: [id number] DOB:10/15/1945, 79 y.o., male Today's Date: 02/26/2024  PCP: Kennyth Bart MD REFERRING PROVIDER: Tobie Comp, MD  END OF SESSION:  End of Session - 02/26/24 1631     Visit Number 3    Number of Visits 17    Date for Recertification  04/09/24    SLP Start Time 1535    SLP Stop Time  1616    SLP Time Calculation (min) 41 min    Activity Tolerance Patient tolerated treatment well           Past Medical History:  Diagnosis Date   A-fib [facility])    Episode after surgery   Allergy    seasonal   Atrial tachycardia 06/04/2012   12/13-QRS duration-102 ms 6/14-QRS duration-98 ms    CAD (coronary artery disease)    a. s/p CABG 11/2018   CKD (chronic kidney disease), stage II    DDD (degenerative disc disease)    Diverticulosis of colon    Hemorrhoids    History of kidney stones    Hypercholesterolemia    Hypertension    NSVT (nonsustained ventricular tachycardia) (HCC)    PAC (premature atrial contraction)    Pneumothorax    Postoperative atrial fibrillation (HCC)    Renal calculus    RENAL CALCULUS, HX OF 12/26/2006   Qualifier: Diagnosis of  By: Tanda Milling     S/P rotator cuff surgery 04/15/2014   Shoulder pain    Sinus bradycardia    Past Surgical History:  Procedure Laterality Date   COLONOSCOPY     CORONARY ARTERY BYPASS GRAFT N/A 11/23/2018   Procedure: CORONARY ARTERY BYPASS GRAFTING (CABG), ON PUMP, TIMES FOUR, LIMA to LAD, SEQ SVG to PDA, PLVB, SVG to CIRCUMFLEX, USING LEFT INTERNAL MAMMARY ARTERY AND RIGHT GREATER SAPHENOUS VEIN HARVESTED ENDOSCOPICALLY AND LEFT GREATER SAPHENOUS VEIN;  Surgeon: Army Dallas NOVAK, MD;  Location: [facility] OR;  Service: Open Heart Surgery;  Laterality: N/A;   CYSTOSCOPY/URETEROSCOPY/HOLMIUM LASER/STENT PLACEMENT Left 08/22/2022   Procedure: CYSTOSCOPY/LEFT RETROGRADE PYELOGRAM/ LEFT URETEROSCOPY/HOLMIUM LASER/LEFT URETERAL STENT  PLACEMENT;  Surgeon: Renda Glance, MD;  Location: [facility] ORS;  Service: Urology;  Laterality: Left;  45 MINUTES NEEDED FOR CASE   LEFT HEART CATH AND CORONARY ANGIOGRAPHY N/A 11/18/2018   Procedure: LEFT HEART CATH AND CORONARY ANGIOGRAPHY;  Surgeon: Verlin Lonni BIRCH, MD;  Location: [facility] INVASIVE CV LAB;  Service: Cardiovascular;  Laterality: N/A;   NASAL SEPTOPLASTY W/ TURBINOPLASTY Bilateral 10/27/2023   Procedure: SEPTOPLASTY, NOSE, WITH NASAL TURBINATE REDUCTION;  Surgeon: Karis Clunes, MD;  Location: [facility];  Service: ENT;  Laterality: Bilateral;   None     SHOULDER SURGERY Left 03/29/2014   TEE WITHOUT CARDIOVERSION N/A 11/23/2018   Procedure: TRANSESOPHAGEAL ECHOCARDIOGRAM (TEE);  Surgeon: Army Dallas NOVAK, MD;  Location: [facility] OR;  Service: Open Heart Surgery;  Laterality: N/A;   Patient Active Problem List   Diagnosis Date Noted   Heart failure, unspecified HF chronicity, unspecified heart failure type (HCC) 12/04/2023   Impacted cerumen of both ears 05/16/2023   Chronic rhinitis 04/12/2023   Deviated nasal septum 04/12/2023   Hypertrophy of nasal turbinates 04/12/2023   Lightheadedness - likely neurally mediated 06/18/2022   Sinus bradycardia 06/10/2021   Degenerative disc disease, lumbar 02/16/2021   Hyperglycemia 02/16/2021   Dyslipidemia 02/16/2021   Former smoker 02/15/2020   S/P CABG x 4 11/24/2018   CKD (chronic kidney disease), stage III 11/20/2018  Coronary artery disease involving native coronary artery of native heart with angina pectoris    Cervical disc disorder with radiculopathy of cervical region 01/22/2013   Atrial tachycardia 06/04/2012   Essential hypertension 12/26/2006    Onset date: June-September 2025; script 01-20-24  REFERRING DIAG:  R49.0 (ICD-10-CM) - Dysphonia  J38.5 (ICD-10-CM) - Laryngeal spasm  R49.0 (ICD-10-CM) - Muscle tension dysphonia    THERAPY DIAG:  Voice hoarseness  Rationale for Evaluation and Treatment:  Rehabilitation  SUBJECTIVE:   SUBJECTIVE STATEMENT: It got worse after nasal surgery.  Pt accompanied by: self  PERTINENT HISTORY: see above for PMH From [place], January 20, 2024:  He notes gradual worsening of his voice, described as a whisper that fatigues and deteriorates with use, with only brief mild improvement. no complete aphonia. Symptoms began 3 to 6 months ago without preceding illness, trauma, or intubation.  PAIN:  Are you having pain? No  FALLS: Has patient fallen in last 6 months? No,   LIVING ENVIRONMENT: Lives with: lives with their spouse Lives in: House/apartment  PLOF:Level of assistance: Independent with ADLs, Independent with IADLs Employment: Other: retired  PATIENT GOALS: improve voice  OBJECTIVE:  Note: Objective measures were completed at Evaluation unless otherwise noted.  DIAGNOSTIC FINDINGS:  Tobie 01/20/24: VLS Exam Detail:         Amplitude - Left: normal Amplitude - Right: normal  Mucosal Wave - Left: normal Mucosal Wave - Right: normal  Vocal Fold Motion - Left: No motion abnormalities Vocal Fold Motion - Right: No motion abnormalities  Vertical Level of Approximation: equal Glottic Closure: Hourglass pattern with ~1-2 mm gap  Phase Symmetry: symmetric Periodicity (Regularity): regular  Supraglottic Hyperfunction: mixed Nasopharynx: Bilateral tori wnl, no masses or lesions  Airway: Widely Patent Additional Observations: Clear AP- compression supraglottis    Exam Summary: Examiner: Eldora Tobie, MD Diagnostician: Eldora Tobie, MD  Anesthesia: Yes, Topical (oxymetazoline  and 4% lidocaine ) Sensitivity to Scope: modest    Summary of Findings: The videostroboscopic exam revealed A-P and lateral compression of the supraglottis with phonation. There were no masses, lesions, or vocal fold motion abnormalities on exam.   Jeff Graves is a 79 y.o. male with:   1. Dysphonia   2. Laryngeal spasm   3. Muscle tension dysphonia   4.  Presbyphonia   5. Glottic insufficiency   6. Vocal fold atrophy     Most likely combination of atrophy and now MTD. We discussed options. He opted for increased hydration and voice therapy to start. If no improvement and still having significant issues, can consider injection augmentation - likely fat  COGNITION: Overall cognitive status: Within functional limits for tasks assessed  SOCIAL HISTORY: Occupation: retired Counsellor intake: suboptimal - approx 25 oz every day Caffeine/alcohol intake: moderate - approx 32 oz every day  Daily voice use: minimal  PERCEPTUAL VOICE ASSESSMENT: Voice quality: breathy, harsh, and strained Vocal abuse: abnormal breathing pattern and habitual loudness Resonance: normal Respiratory function: thoracic breathing  OBJECTIVE VOICE ASSESSMENT: Maximum phonation time for sustained ah: 3.4 seconds (significantly below WNL) Conversational pitch average: 169 Hz Conversational pitch range: 155-207 Hz Conversational loudness average: 73 dB Conversational loudness range: 50-84 dB S/z ratio:  (Suggestive of dysfunction >1.0)  PATIENT REPORTED OUTCOME MEASURES (PROM): The Voice Handicap Index-10 (VHI-10) was administered. This survey is a series of questions targeting the patient's perception of his/her own voice using a scale of 0-4 (0=Never, 4=Always). Score greater than 11 is abnormal. My voice makes it difficult for people to hear me.  3  People have difficulty understanding me in a noisy room. 4  My voice difficulties restrict personal and social life. 3 I feel left out of conversations because of my voice. 3  My voice problem causes me to lose income. 0  I feel as though I have to strain to produce voice. 3  The clarity of my voice is unpredictable. 4  My voice problem upsets me. 4  My voice makes me feel handicapped. 4  People ask, What's wrong with your voice? 3  TOTAL SCORE:  31/40 , where >11 is abnormal                                                                                                                             TREATMENT DATE:  SOVTE=Semi-occluded vocal tract exercises, RVT=Resonant voice therapy, MTD=Muscle tension dysphonia  02/26/24: Pt has been feeling increased congestion. SLP collaborated with pt to discuss vocal hygiene and current water intake. SLP educated pt about increasing water intake from 32 oz to 64 oz to improve hydration for vocal hygiene. Pt is agreeable to trying to increase water intake. SLP reviewed using RVT for improving forward-focused resonance for voice production. During /b, m, n/ word productions, pt achieved forward-focused resonance in 80% of opportunities given occasional min A. SLP increased the challenge by having pt read /m, n/ phrases and sentences for utilizing balanced phonation. Pt was 75% consistent with achieving balanced phonation (forward-focused resonance and diaphragmatic breathing) during /m, n/ phrases and sentences given occasional min A. Pt used clear voicing in 70% of opportunities during structured voice exercises, displaying improvement from previous session. Pt is developing his self-awareness for recognizing forward vs. Back focused resonance. Cues for pressure and light vibration were helpful for supporting pt's understanding and awareness. Plan is to continue RVT and possibly initiate Financial Controller therapy for generalizing balanced phonation into daily conversation. Pt needs education about throat clearing strategies in upcoming session.  02/19/24: SLP introduced respiratory retraining for optimizing airflow for balanced phonation. SLP taught pt how to utilize a diaphragmatic breath for increasing airflow for voice, especially in setting of VF atrophy. After SLP guidance, pt demonstrated diaphragmatic breathing with 75% consistency given occasional min A. SLP introduced modified Resonant Voice Therapy (RVT) focused on developing pt self-awareness of forward-focused voicing.  SLP utilized initial /b/ words to establish pt self-awareness for RVT. Pt said initial /b/ words, achieving improved vocal quality in 80% of opportunities given frequent min A for diaphragmatic breathing. Pt's self-awareness of forward-focused resonance was benefited by describing sensations as pressure rather than vibrations. After pt said initial /m/ words, he displayed emerging understanding of vibration as a cue for forward-focused voicing. This will need reinforcement in upcoming sessions along with cue for pressure. SLP provided pt HEP focused on initial /b/ words for further practice. Plan is to continue RVT and SOVTEs for maximizing pt's vocal quality for QOL.   02/10/24: SLP attempted use of SOVTE and RVT to decr  pt's s/sx MTD and improve pt's voice quality. Nothing today was successful completely, pt did not indicate he felt any more resonance in oral or nasal cavity with forward focus for /m/ or /u/.    PATIENT EDUCATION: Education details: see above-procedure for SOVTE and RVT tasks  Person educated: Patient Education method: Explanation, Demonstration, and Verbal cues Education comprehension: verbalized understanding, returned demonstration, verbal cues required, and needs further education  HOME EXERCISE PROGRAM: TBD after first scheduled therapy session.  GOALS: Goals reviewed with patient? Yes  SHORT TERM GOALS: Target date: 03/12/24  Pt will complete voice exercises with 70% WNL voice in 2 sessions with rare min A Baseline: Goal status: INITIAL   2.  Pt will complete VHI-10 with score </= 20/40 Baseline:  Goal status: INITIAL   3.  Pt will incr water intake to 60oz, and decr caffeine intake to 12 oz between two sessions Baseline:  Goal status: INITIAL   4.  Pt will demo AB in 16/20 sentence responses Baseline:  Goal status: INITIAL     LONG TERM GOALS: Target date: 03/2724   Pt will improve VHI-10 to </= 11/40 Baseline:  Goal status: INITIAL   Pt will  complete PhoRTE independently in 2 sessions Baseline: Goal status: INITIAL   3.  Pt will incr water intake to > 60oz/day and decr caffeine intake to 12 oz/daybetween four total sessions Baseline:  Goal status: INITIAL   4.  Pt will demo AB 75% in 8 minutes conversation Baseline:  Goal status: INITIAL   5.  Pt will demo WNL voice quality in 8 minutes conversation in 2 sessions             Baseline:             Goal status: INITIAL  ASSESSMENT:  CLINICAL IMPRESSION: Patient is a 79 y.o. M who was seen today for assessment of voice quality in light of dx'd vocal fold atrophy, muscle tension dysphonia, and glottal insufficiency. Pt, as discovered by VHI-10, is very concerned about his voice quality and it has had a negative affect on his QoL. Voice therapy is recommended due to severity of symptoms and current impact of dysphonia on pt's QoL. Without voice therapy, pt is at risk of reduced social engagement due to communication limitations from his voice disorder.   OBJECTIVE IMPAIRMENTS: include voice disorder. These impairments are limiting patient from household responsibilities, ADLs/IADLs, and effectively communicating at home and in community. Factors affecting potential to achieve goals and functional outcome are severity of impairments.. Patient will benefit from skilled SLP services to address above impairments and improve overall function.  REHAB POTENTIAL: Good  PLAN:  SLP FREQUENCY: 1-2x/week  SLP DURATION: 8 weeks  PLANNED INTERVENTIONS: Environmental controls, Internal/external aids, Functional tasks, SLP instruction and feedback, Compensatory strategies, Patient/family education, [phone] Treatment of speech (30 or 45 min) , and voice exercises    [employer]ell [employer], M.A., CF-SLP 02/26/2024, 4:33 PM      "

## 2024-02-26 NOTE — Patient Instructions (Addendum)
 To increase water intake/improve hydration:  Get a 32 oz or 40 oz water jug. Try Hint flavored waters.  Humidifier for bedroom.

## 2024-03-03 ENCOUNTER — Ambulatory Visit

## 2024-03-03 DIAGNOSIS — R490 Dysphonia: Secondary | ICD-10-CM | POA: Diagnosis not present

## 2024-03-03 NOTE — Therapy (Unsigned)
 " OUTPATIENT SPEECH LANGUAGE PATHOLOGY VOICE TREATMENT-DISCHARGE   Patient Name: Jeff Graves MRN: [id number] DOB:1945-07-20, 79 y.o., male Today's Date: 03/04/2024  PCP: Kennyth Bart MD REFERRING PROVIDER: Tobie Comp, MD  END OF SESSION:  End of Session - 03/04/24 1443     Visit Number 4    Number of Visits 17    Date for Recertification  04/09/24    SLP Start Time 1235    SLP Stop Time  1315    SLP Time Calculation (min) 40 min    Activity Tolerance Patient tolerated treatment well            Past Medical History:  Diagnosis Date   A-fib [facility])    Episode after surgery   Allergy    seasonal   Atrial tachycardia 06/04/2012   12/13-QRS duration-102 ms 6/14-QRS duration-98 ms    CAD (coronary artery disease)    a. s/p CABG 11/2018   CKD (chronic kidney disease), stage II    DDD (degenerative disc disease)    Diverticulosis of colon    Hemorrhoids    History of kidney stones    Hypercholesterolemia    Hypertension    NSVT (nonsustained ventricular tachycardia) (HCC)    PAC (premature atrial contraction)    Pneumothorax    Postoperative atrial fibrillation (HCC)    Renal calculus    RENAL CALCULUS, HX OF 12/26/2006   Qualifier: Diagnosis of  By: Tanda Milling     S/P rotator cuff surgery 04/15/2014   Shoulder pain    Sinus bradycardia    Past Surgical History:  Procedure Laterality Date   COLONOSCOPY     CORONARY ARTERY BYPASS GRAFT N/A 11/23/2018   Procedure: CORONARY ARTERY BYPASS GRAFTING (CABG), ON PUMP, TIMES FOUR, LIMA to LAD, SEQ SVG to PDA, PLVB, SVG to CIRCUMFLEX, USING LEFT INTERNAL MAMMARY ARTERY AND RIGHT GREATER SAPHENOUS VEIN HARVESTED ENDOSCOPICALLY AND LEFT GREATER SAPHENOUS VEIN;  Surgeon: Army Dallas NOVAK, MD;  Location: [facility] OR;  Service: Open Heart Surgery;  Laterality: N/A;   CYSTOSCOPY/URETEROSCOPY/HOLMIUM LASER/STENT PLACEMENT Left 08/22/2022   Procedure: CYSTOSCOPY/LEFT RETROGRADE PYELOGRAM/ LEFT URETEROSCOPY/HOLMIUM LASER/LEFT  URETERAL STENT PLACEMENT;  Surgeon: Renda Glance, MD;  Location: [facility] ORS;  Service: Urology;  Laterality: Left;  45 MINUTES NEEDED FOR CASE   LEFT HEART CATH AND CORONARY ANGIOGRAPHY N/A 11/18/2018   Procedure: LEFT HEART CATH AND CORONARY ANGIOGRAPHY;  Surgeon: Verlin Lonni BIRCH, MD;  Location: [facility] INVASIVE CV LAB;  Service: Cardiovascular;  Laterality: N/A;   NASAL SEPTOPLASTY W/ TURBINOPLASTY Bilateral 10/27/2023   Procedure: SEPTOPLASTY, NOSE, WITH NASAL TURBINATE REDUCTION;  Surgeon: Karis Clunes, MD;  Location: [facility];  Service: ENT;  Laterality: Bilateral;   None     SHOULDER SURGERY Left 03/29/2014   TEE WITHOUT CARDIOVERSION N/A 11/23/2018   Procedure: TRANSESOPHAGEAL ECHOCARDIOGRAM (TEE);  Surgeon: Army Dallas NOVAK, MD;  Location: [facility] OR;  Service: Open Heart Surgery;  Laterality: N/A;   Patient Active Problem List   Diagnosis Date Noted   Heart failure, unspecified HF chronicity, unspecified heart failure type (HCC) 12/04/2023   Impacted cerumen of both ears 05/16/2023   Chronic rhinitis 04/12/2023   Deviated nasal septum 04/12/2023   Hypertrophy of nasal turbinates 04/12/2023   Lightheadedness - likely neurally mediated 06/18/2022   Sinus bradycardia 06/10/2021   Degenerative disc disease, lumbar 02/16/2021   Hyperglycemia 02/16/2021   Dyslipidemia 02/16/2021   Former smoker 02/15/2020   S/P CABG x 4 11/24/2018   CKD (chronic kidney disease), stage III 11/20/2018  Coronary artery disease involving native coronary artery of native heart with angina pectoris    Cervical disc disorder with radiculopathy of cervical region 01/22/2013   Atrial tachycardia 06/04/2012   Essential hypertension 12/26/2006   SPEECH THERAPY DISCHARGE SUMMARY  Visits from Start of Care: 4  Current functional level related to goals / functional outcomes: Pt's voice quality improved as he was able to concentrate more on forward focus of his voice. This was done best with SLP  initial cues. Pt began using abdominal breathing with structured tasks but carryover has not occurred.    Remaining deficits: Mild hoarseness to intermittent mod hoarseness.   Education / Equipment: See individual session notes.   Patient agrees to discharge. Patient goals were not met. Patient is being discharged due to the patient's request..     Onset date: June-September 2025; script 01-20-24  REFERRING DIAG:  R49.0 (ICD-10-CM) - Dysphonia  J38.5 (ICD-10-CM) - Laryngeal spasm  R49.0 (ICD-10-CM) - Muscle tension dysphonia    THERAPY DIAG:  Voice hoarseness  Rationale for Evaluation and Treatment: Rehabilitation  SUBJECTIVE:   SUBJECTIVE STATEMENT: I think I'm going to take some time off, and work on this at home and cancel the rest of my appointments.  Pt accompanied by: self  PERTINENT HISTORY: see above for PMH From [place], January 20, 2024:  He notes gradual worsening of his voice, described as a whisper that fatigues and deteriorates with use, with only brief mild improvement. no complete aphonia. Symptoms began 3 to 6 months ago without preceding illness, trauma, or intubation.  PAIN:  Are you having pain? No  FALLS: Has patient fallen in last 6 months? No,   LIVING ENVIRONMENT: Lives with: lives with their spouse Lives in: House/apartment  PLOF:Level of assistance: Independent with ADLs, Independent with IADLs Employment: Other: retired  PATIENT GOALS: improve voice  OBJECTIVE:  Note: Objective measures were completed at Evaluation unless otherwise noted.  DIAGNOSTIC FINDINGS:  Tobie 01/20/24: VLS Exam Detail:         Amplitude - Left: normal Amplitude - Right: normal  Mucosal Wave - Left: normal Mucosal Wave - Right: normal  Vocal Fold Motion - Left: No motion abnormalities Vocal Fold Motion - Right: No motion abnormalities  Vertical Level of Approximation: equal Glottic Closure: Hourglass pattern with ~1-2 mm gap  Phase Symmetry: symmetric  Periodicity (Regularity): regular  Supraglottic Hyperfunction: mixed Nasopharynx: Bilateral tori wnl, no masses or lesions  Airway: Widely Patent Additional Observations: Clear AP- compression supraglottis    Exam Summary: Examiner: Eldora Tobie, MD Diagnostician: Eldora Tobie, MD  Anesthesia: Yes, Topical (oxymetazoline  and 4% lidocaine ) Sensitivity to Scope: modest    Summary of Findings: The videostroboscopic exam revealed A-P and lateral compression of the supraglottis with phonation. There were no masses, lesions, or vocal fold motion abnormalities on exam.   Javarus Dorner is a 79 y.o. male with:   1. Dysphonia   2. Laryngeal spasm   3. Muscle tension dysphonia   4. Presbyphonia   5. Glottic insufficiency   6. Vocal fold atrophy     Most likely combination of atrophy and now MTD. We discussed options. He opted for increased hydration and voice therapy to start. If no improvement and still having significant issues, can consider injection augmentation - likely fat  COGNITION: Overall cognitive status: Within functional limits for tasks assessed  SOCIAL HISTORY: Occupation: retired Counsellor intake: suboptimal - approx 25 oz every day Caffeine/alcohol intake: moderate - approx 32 oz every day  Daily voice use:  minimal  PERCEPTUAL VOICE ASSESSMENT: Voice quality: breathy, harsh, and strained Vocal abuse: abnormal breathing pattern and habitual loudness Resonance: normal Respiratory function: thoracic breathing  OBJECTIVE VOICE ASSESSMENT: Maximum phonation time for sustained ah: 3.4 seconds (significantly below WNL) Conversational pitch average: 169 Hz Conversational pitch range: 155-207 Hz Conversational loudness average: 73 dB Conversational loudness range: 50-84 dB S/z ratio:  (Suggestive of dysfunction >1.0)  PATIENT REPORTED OUTCOME MEASURES (PROM): The Voice Handicap Index-10 (VHI-10) was administered. This survey is a series of questions targeting the patient's  perception of his/her own voice using a scale of 0-4 (0=Never, 4=Always). Score greater than 11 is abnormal. My voice makes it difficult for people to hear me. 3  People have difficulty understanding me in a noisy room. 4  My voice difficulties restrict personal and social life. 3 I feel left out of conversations because of my voice. 3  My voice problem causes me to lose income. 0  I feel as though I have to strain to produce voice. 3  The clarity of my voice is unpredictable. 4  My voice problem upsets me. 4  My voice makes me feel handicapped. 4  People ask, What's wrong with your voice? 3  TOTAL SCORE:  31/40 , where >11 is abnormal                                                                                                                            TREATMENT DATE:  SOVTE=Semi-occluded vocal tract exercises, RVT=Resonant voice therapy, MTD=Muscle tension dysphonia  03/03/24: SLP directed pt in participating with RVT tasks today, reading sentence level material. Voice was still hoarse, but strained/strangled quality was eliminated. SLP increased complexity and had pt generate words with forward focus using abdominal breath and pt was 85% successful with improvement of voice quality. SLP further incr'd complexity and pt responded with three word sequences with 80% success with forward focus and use of abdominal breath. Pt's voice remained mildly hoarse and mildly breathy throughout session.  SLP gave pt homework for forward focus with multiword and sentence responses and pt stated s statement above and canceled remaining appointments.   02/26/24: Pt has been feeling increased congestion. SLP collaborated with pt to discuss vocal hygiene and current water intake. SLP educated pt about increasing water intake from 32 oz to 64 oz to improve hydration for vocal hygiene. Pt is agreeable to trying to increase water intake. SLP reviewed using RVT for improving forward-focused resonance for voice  production. During /b, m, n/ word productions, pt achieved forward-focused resonance in 80% of opportunities given occasional min A. SLP increased the challenge by having pt read /m, n/ phrases and sentences for utilizing balanced phonation. Pt was 75% consistent with achieving balanced phonation (forward-focused resonance and diaphragmatic breathing) during /m, n/ phrases and sentences given occasional min A. Pt used clear voicing in 70% of opportunities during structured voice exercises, displaying improvement from previous session. Pt is developing his self-awareness for recognizing  forward vs. Back focused resonance. Cues for pressure and light vibration were helpful for supporting pt's understanding and awareness. Plan is to continue RVT and possibly initiate Financial Controller therapy for generalizing balanced phonation into daily conversation. Pt needs education about throat clearing strategies in upcoming session.  02/19/24: SLP introduced respiratory retraining for optimizing airflow for balanced phonation. SLP taught pt how to utilize a diaphragmatic breath for increasing airflow for voice, especially in setting of VF atrophy. After SLP guidance, pt demonstrated diaphragmatic breathing with 75% consistency given occasional min A. SLP introduced modified Resonant Voice Therapy (RVT) focused on developing pt self-awareness of forward-focused voicing. SLP utilized initial /b/ words to establish pt self-awareness for RVT. Pt said initial /b/ words, achieving improved vocal quality in 80% of opportunities given frequent min A for diaphragmatic breathing. Pt's self-awareness of forward-focused resonance was benefited by describing sensations as pressure rather than vibrations. After pt said initial /m/ words, he displayed emerging understanding of vibration as a cue for forward-focused voicing. This will need reinforcement in upcoming sessions along with cue for pressure. SLP provided pt HEP focused  on initial /b/ words for further practice. Plan is to continue RVT and SOVTEs for maximizing pt's vocal quality for QOL.   02/10/24: SLP attempted use of SOVTE and RVT to decr pt's s/sx MTD and improve pt's voice quality. Nothing today was successful completely, pt did not indicate he felt any more resonance in oral or nasal cavity with forward focus for /m/ or /u/.    PATIENT EDUCATION: Education details: see above-procedure for SOVTE and RVT tasks  Person educated: Patient Education method: Explanation, Demonstration, and Verbal cues Education comprehension: verbalized understanding, returned demonstration, verbal cues required, and needs further education  HOME EXERCISE PROGRAM: TBD after first scheduled therapy session.  GOALS: Goals reviewed with patient? Yes  SHORT TERM GOALS: Target date: 03/12/24  Pt will complete voice exercises with 70% WNL voice in 2 sessions with rare min A Baseline: Goal status: not met   2.  Pt will complete VHI-10 with score </= 20/40 Baseline:  Goal status: not met   3.  Pt will incr water intake to 60oz, and decr caffeine intake to 12 oz between two sessions Baseline:  Goal status: not met   4.  Pt will demo AB in 16/20 sentence responses Baseline:  Goal status: not met     LONG TERM GOALS: Target date: 03/2724   Pt will improve VHI-10 to </= 11/40 Baseline:  Goal status: not met   Pt will complete PhoRTE independently in 2 sessions Baseline: Goal status: not met   3.  Pt will incr water intake to > 60oz/day and decr caffeine intake to 12 oz/daybetween four total sessions Baseline:  Goal status: not met   4.  Pt will demo AB 75% in 8 minutes conversation Baseline:  Goal status: not met   5.  Pt will demo WNL voice quality in 8 minutes conversation in 2 sessions             Baseline:             Goal status: INITInot metAL  ASSESSMENT:  CLINICAL IMPRESSION: Patient is a 79 y.o. M who was seen today for treatment of voice  quality in light of dx'd vocal fold atrophy, muscle tension dysphonia, and glottal insufficiency. See treatment date above for today's date for further details on today's session. Today after session pt stated he would continue to work on this at home and canceled  remaining sessions.  Pt, as discovered by VHI-10, appeared very concerned about his voice quality on day of eval, and it has had a negative affect on his QoL. Voice therapy is recommended due to severity of symptoms and current impact of dysphonia on pt's QoL. Without voice therapy, pt is at risk of reduced social engagement due to communication limitations from his voice disorder.   OBJECTIVE IMPAIRMENTS: include voice disorder. These impairments are limiting patient from household responsibilities, ADLs/IADLs, and effectively communicating at home and in community. Factors affecting potential to achieve goals and functional outcome are severity of impairments.. Patient will benefit from skilled SLP services to address above impairments and improve overall function.  REHAB POTENTIAL: Good  PLAN:  discharge today due to pt request  PLANNED INTERVENTIONS: Environmental controls, Internal/external aids, Functional tasks, SLP instruction and feedback, Compensatory strategies, Patient/family education, [phone] Treatment of speech (30 or 45 min) , and voice exercises    Waddell [employer], M.A., CF-SLP 03/04/2024, 2:43 PM      "

## 2024-03-03 NOTE — Patient Instructions (Signed)
 Make Move Meet [facility] Mail Manage Model [place] Mind Measure Bone Bathe Karon Babe Bright Both

## 2024-03-04 ENCOUNTER — Ambulatory Visit

## 2024-03-11 ENCOUNTER — Ambulatory Visit

## 2024-03-16 ENCOUNTER — Ambulatory Visit

## 2024-03-18 ENCOUNTER — Ambulatory Visit

## 2024-03-22 ENCOUNTER — Ambulatory Visit

## 2024-03-25 ENCOUNTER — Ambulatory Visit

## 2024-03-26 ENCOUNTER — Encounter

## 2024-03-26 ENCOUNTER — Ambulatory Visit

## 2024-03-29 ENCOUNTER — Ambulatory Visit

## 2024-04-01 ENCOUNTER — Ambulatory Visit

## 2024-04-05 ENCOUNTER — Ambulatory Visit

## 2024-04-08 ENCOUNTER — Ambulatory Visit

## 2024-04-20 ENCOUNTER — Ambulatory Visit (INDEPENDENT_AMBULATORY_CARE_PROVIDER_SITE_OTHER): Admitting: Otolaryngology

## 2024-04-23 ENCOUNTER — Encounter: Admitting: Family Medicine

## 2024-06-02 ENCOUNTER — Ambulatory Visit (INDEPENDENT_AMBULATORY_CARE_PROVIDER_SITE_OTHER): Admitting: Otolaryngology

## 2024-12-28 ENCOUNTER — Ambulatory Visit
